# Patient Record
Sex: Female | Born: 1940
Health system: Southern US, Community
[De-identification: ages and names within clinical notes are randomized; demographics above are authoritative.]

## PROBLEM LIST (undated history)

## (undated) DIAGNOSIS — E782 Mixed hyperlipidemia: Secondary | ICD-10-CM

## (undated) DIAGNOSIS — R002 Palpitations: Secondary | ICD-10-CM

## (undated) DIAGNOSIS — M1990 Unspecified osteoarthritis, unspecified site: Secondary | ICD-10-CM

## (undated) DIAGNOSIS — I10 Essential (primary) hypertension: Secondary | ICD-10-CM

## (undated) DIAGNOSIS — C801 Malignant (primary) neoplasm, unspecified: Secondary | ICD-10-CM

## (undated) DIAGNOSIS — M48061 Spinal stenosis, lumbar region without neurogenic claudication: Secondary | ICD-10-CM

## (undated) DIAGNOSIS — E119 Type 2 diabetes mellitus without complications: Secondary | ICD-10-CM

## (undated) DIAGNOSIS — F329 Major depressive disorder, single episode, unspecified: Secondary | ICD-10-CM

## (undated) DIAGNOSIS — K219 Gastro-esophageal reflux disease without esophagitis: Secondary | ICD-10-CM

## (undated) DIAGNOSIS — E039 Hypothyroidism, unspecified: Secondary | ICD-10-CM

## (undated) DIAGNOSIS — F32A Depression, unspecified: Secondary | ICD-10-CM

## (undated) DIAGNOSIS — C55 Malignant neoplasm of uterus, part unspecified: Secondary | ICD-10-CM

## (undated) DIAGNOSIS — M4800 Spinal stenosis, site unspecified: Secondary | ICD-10-CM

## (undated) DIAGNOSIS — F419 Anxiety disorder, unspecified: Secondary | ICD-10-CM

## (undated) DIAGNOSIS — Z8679 Personal history of other diseases of the circulatory system: Secondary | ICD-10-CM

## (undated) DIAGNOSIS — F4001 Agoraphobia with panic disorder: Secondary | ICD-10-CM

## (undated) DIAGNOSIS — H409 Unspecified glaucoma: Secondary | ICD-10-CM

## (undated) DIAGNOSIS — C541 Malignant neoplasm of endometrium: Secondary | ICD-10-CM

## (undated) HISTORY — DX: Type 2 diabetes mellitus without complications: E11.9

## (undated) HISTORY — DX: Unspecified osteoarthritis, unspecified site: M19.90

## (undated) HISTORY — PX: REPLACEMENT TOTAL KNEE: SUR1224

## (undated) HISTORY — DX: Unspecified glaucoma: H40.9

## (undated) HISTORY — DX: Hypothyroidism, unspecified: E03.9

## (undated) HISTORY — PX: ABDOMINAL HYSTERECTOMY: SHX81

## (undated) HISTORY — DX: Spinal stenosis, lumbar region without neurogenic claudication: M48.061

## (undated) HISTORY — DX: Palpitations: R00.2

## (undated) HISTORY — DX: Mixed hyperlipidemia: E78.2

## (undated) HISTORY — PX: LIPOMA EXCISION: SHX5283

## (undated) HISTORY — PX: OTHER SURGICAL HISTORY: SHX169

## (undated) HISTORY — DX: Essential (primary) hypertension: I10

## (undated) HISTORY — DX: Malignant (primary) neoplasm, unspecified: C80.1

## (undated) HISTORY — PX: TONSILLECTOMY AND ADENOIDECTOMY: SUR1326

## (undated) HISTORY — DX: Malignant neoplasm of uterus, part unspecified: C55

## (undated) HISTORY — DX: Agoraphobia with panic disorder: F40.01

## (undated) HISTORY — PX: BREAST BIOPSY: SHX20

## (undated) HISTORY — DX: Malignant neoplasm of endometrium: C54.1

---

## 2012-03-22 ENCOUNTER — Ambulatory Visit (HOSPITAL_COMMUNITY)
Admission: RE | Admit: 2012-03-22 | Discharge: 2012-03-22 | Disposition: A | Discharge status: Home / Self Care | Payer: Medicare Other | Source: Ambulatory Visit | Attending: Ophthalmology | Admitting: Ophthalmology

## 2012-03-22 ENCOUNTER — Encounter (HOSPITAL_COMMUNITY)
Admission: RE | Disposition: A | Discharge status: Home / Self Care | Payer: Self-pay | Source: Ambulatory Visit | Attending: Ophthalmology

## 2012-03-22 ENCOUNTER — Encounter (HOSPITAL_COMMUNITY): Payer: Self-pay | Admitting: *Deleted

## 2012-03-22 DIAGNOSIS — E7800 Pure hypercholesterolemia, unspecified: Secondary | ICD-10-CM | POA: Insufficient documentation

## 2012-03-22 DIAGNOSIS — H4010X0 Unspecified open-angle glaucoma, stage unspecified: Secondary | ICD-10-CM | POA: Insufficient documentation

## 2012-03-22 SURGERY — SLT LASER APPLICATION
Anesthesia: LOCAL | Site: Eye | Laterality: Right

## 2012-03-22 MED ORDER — PILOCARPINE HCL 1 % OP SOLN
OPHTHALMIC | Status: AC
  Administered 2012-03-22: 1 [drp] via OPHTHALMIC
  Filled 2012-03-22: qty 15

## 2012-03-22 MED ORDER — TETRACAINE HCL 0.5 % OP SOLN
OPHTHALMIC | Status: AC
  Administered 2012-03-22: 1 [drp] via OPHTHALMIC
  Filled 2012-03-22: qty 2

## 2012-03-22 MED ORDER — APRACLONIDINE HCL 1 % OP SOLN
1.0000 [drp] | OPHTHALMIC | Status: AC
  Administered 2012-03-22 (×3): 1 [drp] via OPHTHALMIC

## 2012-03-22 MED ORDER — APRACLONIDINE HCL 1 % OP SOLN
OPHTHALMIC | Status: AC
  Administered 2012-03-22: 1 [drp] via OPHTHALMIC
  Filled 2012-03-22: qty 0.1

## 2012-03-22 MED ORDER — TETRACAINE HCL 0.5 % OP SOLN
1.0000 [drp] | Freq: Once | OPHTHALMIC | Status: AC
  Administered 2012-03-22: 1 [drp] via OPHTHALMIC

## 2012-03-22 MED ORDER — PILOCARPINE HCL 1 % OP SOLN
1.0000 [drp] | Freq: Once | OPHTHALMIC | Status: AC
  Administered 2012-03-22: 1 [drp] via OPHTHALMIC

## 2012-03-22 NOTE — Discharge Instructions (Signed)
Yesenia Reynolds  03/22/2012     Instructions    Activity: No Restrictions.   Diet: Resume Diet you were on at home.   Pain Medication: Tylenol if Needed.   CONTACT YOUR DOCTOR IF YOU HAVE PAIN, REDNESS IN YOUR EYE, OR DECREASED VISION.   Follow-up wiith Susa Simmonds, MD. May 9th at 11:15   Dr. Lahoma Crocker: [phone]  Dr. Lita Mains: [phone]  Dr. Alto Denver: [phone]   If you find that you cannot contact your physician, but feel that your signs and   Symptoms warrant a physician's attention, call the Emergency Room at   [phone] ext.[phone].

## 2012-03-22 NOTE — H&P (Signed)
I have reviewed the pre printed H&P, the patient was re-examined, and I have identified no significant interval changes in the patient's medical condition.  There is no change in the plan of care since the history and physical of record. 

## 2012-03-22 NOTE — Procedures (Signed)
Yesenia Reynolds 03/22/2012  Susa Simmonds, MD  Yag Laser Self Test Completedyes. Procedure: Iridotomy/SLT, right eye.  Eye Protection Worn by Staff yes. Laser In Use Sign on Door yes.  Laser: Nd:YAG Spot Size: Fixed Burst Mode: III Power Setting: 0.7 mJ/burst Position treated: 360 o'clock position Number of shots: 111 Total energy delivered: 89.5 mJ  Patency of the peripheral iridotomy was confirmed visually.  The patient tolerated the procedure without difficulty. No complications were encountered.  Tenometer reading immediately after procedure: 23 mmHg.  The patient was discharged home with the instructions to continue all her current glaucoma medications, if any.    Patient verbalizes understanding of discharge instructions yes.   Notes:gonioscopy OD:  open grade 4 360 deg.  mild pigment, no synechiae or recessions

## 2012-06-09 ENCOUNTER — Encounter: Payer: Self-pay | Admitting: Cardiology

## 2012-07-05 ENCOUNTER — Encounter: Payer: Self-pay | Admitting: Cardiology

## 2012-07-05 ENCOUNTER — Ambulatory Visit (INDEPENDENT_AMBULATORY_CARE_PROVIDER_SITE_OTHER): Payer: Medicare Other | Admitting: Cardiology

## 2012-07-05 VITALS — BP 147/83 | HR 91 | Ht 61.0 in | Wt 179.8 lb

## 2012-07-05 DIAGNOSIS — E782 Mixed hyperlipidemia: Secondary | ICD-10-CM

## 2012-07-05 DIAGNOSIS — I779 Disorder of arteries and arterioles, unspecified: Secondary | ICD-10-CM

## 2012-07-05 DIAGNOSIS — R002 Palpitations: Secondary | ICD-10-CM | POA: Insufficient documentation

## 2012-07-05 DIAGNOSIS — Z01810 Encounter for preprocedural cardiovascular examination: Secondary | ICD-10-CM

## 2012-07-05 DIAGNOSIS — I10 Essential (primary) hypertension: Secondary | ICD-10-CM

## 2012-07-05 MED ORDER — METOPROLOL SUCCINATE ER 25 MG PO TB24: 12.5000 mg | ORAL_TABLET | Freq: Every day | ORAL | Status: DC

## 2012-07-05 NOTE — Assessment & Plan Note (Signed)
Most likely benign with normal LV function and no syncope. Will observe for now on low-dose beta blocker. Could always consider further outpatient monitoring if symptoms progress.

## 2012-07-05 NOTE — Assessment & Plan Note (Signed)
On statin therapy. Would try to get LDL closer to 70 with documented carotid artery disease.

## 2012-07-05 NOTE — Assessment & Plan Note (Signed)
Nonobstructive and asymptomatic. Would suggest an aspirin daily and continuing statin therapy.

## 2012-07-05 NOTE — Progress Notes (Signed)
Clinical Summary Yesenia Reynolds is a 71 y.o.female referred for cardiology consultation by Dr. Sherril Croon for preoperative consultation. She will be undergoing elective left knee replacement with Dr. Silvano Rusk in late August.  From a cardiac perspective, she reports no exertional chest pain or unusual shortness of breath beyond NYHA class II. She reports a vague sense of intermittent brief palpitations, however no dizziness or syncope. No history of stroke or cardiac dysrhythmia.  Recent echocardiogram report from [facility] internal medicine reviewed showing normal LVEF 60-65% with diastolic dysfunction, thickened mitral leaflets with mild mitral regurgitation, mild to moderate aortic regurgitation, mild tricuspid regurgitation, minimal pericardial effusion versus epicardial fat tissue. Lab work from June showed TSH 1.2, hemoglobin 12.8, platelets 263, BUN 18, creatinine 0.6, potassium 4.1, AST 11, ALT 12, cholesterol 167, triglycerides 144, HDL 49, LDL 109.  She also had carotid Dopplers done that showed less than 50% bilateral ICA stenoses. We discussed this today, rationale for considering aspirin, also aggressive lipid management.  Her ECG today shows sinus rhythm with nonspecific T-wave changes.   No Known Allergies  Current Outpatient Prescriptions  Medication Sig Dispense Refill  . acetaminophen (TYLENOL) 500 MG tablet Take 500 mg by mouth 3 (three) times daily.      . Ascorbic Acid (VITAMIN C) 1000 MG tablet Take 1,000 mg by mouth daily.      Marland Kitchen atorvastatin (LIPITOR) 10 MG tablet Take 10 mg by mouth daily.      . cholecalciferol (VITAMIN D) 1000 UNITS tablet Take 1,000 Units by mouth daily.      Marland Kitchen ibuprofen (ADVIL,MOTRIN) 200 MG tablet Take 200 mg by mouth 3 (three) times daily.      Marland Kitchen latanoprost (XALATAN) 0.005 % ophthalmic solution Place 1 drop into both eyes at bedtime.      Marland Kitchen levothyroxine (SYNTHROID, LEVOTHROID) 88 MCG tablet Take 88 mcg by mouth daily.      Marland Kitchen lisinopril (PRINIVIL,ZESTRIL) 10  MG tablet Take 10 mg by mouth daily.      Marland Kitchen thyroid (ARMOUR) 120 MG tablet Take 88 mg by mouth daily.      . timolol (TIMOPTIC) 0.5 % ophthalmic solution Place 1 drop into the left eye 2 (two) times daily.      . vitamin E 400 UNIT capsule Take 400 Units by mouth daily.        Past Medical History  Diagnosis Date  . Fibromyalgia   . Mixed hyperlipidemia   . Hypothyroidism   . Essential hypertension, benign   . Type 2 diabetes mellitus   . Arthritis   . Carotid artery disease     Less than 50% ICA stenoses 6/13    Past Surgical History  Procedure Date  . Multiple dental extractions   . Breast biopsy   . Lipoma excision     Family History  Problem Relation Age of Onset  . Stroke Mother   . Heart failure Mother   . Hypertension Father   . Coronary artery disease Father     Social History Yesenia Reynolds reports that she has never smoked. She does not have any smokeless tobacco history on file. Yesenia Reynolds reports that she does not drink alcohol.  Review of Systems Chronic back and left knee pain with ambulation. No orthopnea or PND. Stable appetite. No reported bleeding problems. No fevers or chills. Otherwise negative except as outlined.  Physical Examination Filed Vitals:   07/05/12 1305  BP: 147/83  Pulse: 91   Overweight woman in no acute distress. HEENT:  Conjunctiva and lids normal, oropharynx clear. Neck: Supple, no elevated JVP or carotid bruits, no thyromegaly. Lungs: Clear to auscultation, nonlabored breathing at rest. Cardiac: Regular rate and rhythm, no S3 with soft systolic murmur, no pericardial rub. Abdomen: Soft, nontender,  bowel sounds present, no guarding or rebound. Extremities: No pitting edema, distal pulses 2+. Skin: Warm and dry. Musculoskeletal: No kyphosis. Neuropsychiatric: Alert and oriented x3, affect grossly appropriate.    Problem List and Plan   Preoperative cardiovascular examination Recent testing reviewed as well as followup ECG  today. She reports no exertional symptoms of chest pain or limiting breathlessness, only intermittent palpitations without any associated syncope. LV function is normal, and she is on medical therapy for hypertension and hyperlipidemia. She is being considered for elective left knee surgery, presumably under general anesthesia. I would expect that she should be able to proceed with an acceptable perioperative cardiac risk. We will add a low-dose beta blocker for risk reduction, perhaps only use this temporarily. Follow up will be arranged.  Palpitations Most likely benign with normal LV function and no syncope. Will observe for now on low-dose beta blocker. Could always consider further outpatient monitoring if symptoms progress.  Mixed hyperlipidemia On statin therapy. Would try to get LDL closer to 70 with documented carotid artery disease.  Essential hypertension, benign No changes made to current regimen, other than perioperative beta blocker.  Carotid artery disease Nonobstructive and asymptomatic. Would suggest an aspirin daily and continuing statin therapy.    Jonelle Sidle, M.D., F.A.C.C.

## 2012-07-05 NOTE — Assessment & Plan Note (Signed)
No changes made to current regimen, other than perioperative beta blocker.

## 2012-07-05 NOTE — Assessment & Plan Note (Signed)
Recent testing reviewed as well as followup ECG today. She reports no exertional symptoms of chest pain or limiting breathlessness, only intermittent palpitations without any associated syncope. LV function is normal, and she is on medical therapy for hypertension and hyperlipidemia. She is being considered for elective left knee surgery, presumably under general anesthesia. I would expect that she should be able to proceed with an acceptable perioperative cardiac risk. We will add a low-dose beta blocker for risk reduction, perhaps only use this temporarily. Follow up will be arranged.

## 2012-07-05 NOTE — Patient Instructions (Addendum)
Your physician recommends that you schedule a follow-up appointment in: 2 months with Dr. Diona Browner. Your physician has recommended you make the following change in your medication: start Toprol XL 12. 5 mg daily. Your new prescription has been sent to your pharmacy. All other medications are still the same.

## 2012-07-08 ENCOUNTER — Other Ambulatory Visit: Payer: Self-pay | Admitting: Orthopedic Surgery

## 2012-07-08 MED ORDER — BUPIVACAINE 0.25 % ON-Q PUMP SINGLE CATH 300ML: 300.0000 mL | INJECTION | Status: DC

## 2012-07-08 MED ORDER — DEXAMETHASONE SODIUM PHOSPHATE 10 MG/ML IJ SOLN: 10.0000 mg | Freq: Once | INTRAMUSCULAR | Status: DC

## 2012-07-08 NOTE — Progress Notes (Signed)
Preoperative surgical orders have been place into the [facility] hospital system for Yesenia Reynolds on 07/08/2012, 10:22 PM  by Patrica Duel for surgery on 08/04/12.  Preop Total Knee orders including Bupivacaine On-Q pump, IV Tylenol, and IV Decadron as long as there are no contraindications to the above medications. [name], PA-C

## 2012-07-23 ENCOUNTER — Encounter (HOSPITAL_COMMUNITY): Payer: Self-pay

## 2012-07-26 ENCOUNTER — Encounter (HOSPITAL_COMMUNITY)
Admission: RE | Disposition: A | Discharge status: Home / Self Care | Payer: Self-pay | Source: Ambulatory Visit | Attending: Ophthalmology

## 2012-07-26 ENCOUNTER — Ambulatory Visit (HOSPITAL_COMMUNITY)
Admission: RE | Admit: 2012-07-26 | Discharge: 2012-07-26 | Disposition: A | Discharge status: Home / Self Care | Payer: Medicare Other | Source: Ambulatory Visit | Attending: Ophthalmology | Admitting: Ophthalmology

## 2012-07-26 ENCOUNTER — Encounter (HOSPITAL_COMMUNITY): Payer: Self-pay | Admitting: *Deleted

## 2012-07-26 DIAGNOSIS — H409 Unspecified glaucoma: Secondary | ICD-10-CM | POA: Insufficient documentation

## 2012-07-26 SURGERY — SLT LASER APPLICATION
Anesthesia: LOCAL | Site: Eye | Laterality: Left

## 2012-07-26 MED ORDER — TETRACAINE HCL 0.5 % OP SOLN
1.0000 [drp] | Freq: Two times a day (BID) | OPHTHALMIC | Status: DC
  Administered 2012-07-26: 1 [drp] via OPHTHALMIC

## 2012-07-26 MED ORDER — APRACLONIDINE HCL 1 % OP SOLN
1.0000 [drp] | OPHTHALMIC | Status: AC
  Administered 2012-07-26 (×3): 1 [drp] via OPHTHALMIC

## 2012-07-26 MED ORDER — APRACLONIDINE HCL 1 % OP SOLN: 1.0000 [drp] | Freq: Once | OPHTHALMIC | Status: DC

## 2012-07-26 MED ORDER — APRACLONIDINE HCL 1 % OP SOLN
OPHTHALMIC | Status: AC
  Filled 2012-07-26: qty 0.1

## 2012-07-26 MED ORDER — PILOCARPINE HCL 1 % OP SOLN
OPHTHALMIC | Status: AC
  Filled 2012-07-26: qty 15

## 2012-07-26 MED ORDER — TETRACAINE HCL 0.5 % OP SOLN
OPHTHALMIC | Status: AC
  Filled 2012-07-26: qty 2

## 2012-07-26 MED ORDER — PILOCARPINE HCL 1 % OP SOLN
1.0000 [drp] | Freq: Once | OPHTHALMIC | Status: AC
  Administered 2012-07-26: 1 [drp] via OPHTHALMIC

## 2012-07-26 NOTE — H&P (Signed)
I have reviewed the pre printed H&P, the patient was re-examined, and I have identified no significant interval changes in the patient's medical condition.  There is no change in the plan of care since the history and physical of record. 

## 2012-07-26 NOTE — Brief Op Note (Signed)
Yesenia Reynolds 07/26/2012  Susa Simmonds, MD  Yag Laser Self Test Completedyes. Procedure: Iridotomy/SLT, left eye.  Eye Protection Worn by Staff yes. Laser In Use Sign on Door yes.  Laser: Nd:YAG/SLT Spot Size: Fixed Power Setting: 0.9 mJ/burst Position treated: 360 degrees trabecular meshwork Number of shots: 103 Total energy delivered: 92.7 mJ  The patient tolerated the procedure without difficulty. No complications were encountered.  Tenometer reading immediately after procedure: 20  mmHg.  The patient was discharged home with the instructions to continue all her current glaucoma medications, if any.   Patient verbalizes understanding of discharge instructions yes.   Notes:gonioscopy OS:  open 360 deg.  mild pigmentation, sup quadrant plateau iris, several small synechiae noted

## 2012-07-29 ENCOUNTER — Encounter (HOSPITAL_COMMUNITY)
Admission: RE | Admit: 2012-07-29 | Discharge: 2012-07-29 | Disposition: A | Discharge status: Home / Self Care | Payer: Medicare Other | Source: Ambulatory Visit | Attending: Orthopedic Surgery | Admitting: Orthopedic Surgery

## 2012-07-29 ENCOUNTER — Ambulatory Visit (HOSPITAL_COMMUNITY)
Admission: RE | Admit: 2012-07-29 | Discharge: 2012-07-29 | Disposition: A | Discharge status: Home / Self Care | Payer: Medicare Other | Source: Ambulatory Visit | Attending: Orthopedic Surgery | Admitting: Orthopedic Surgery

## 2012-07-29 ENCOUNTER — Encounter (HOSPITAL_COMMUNITY): Payer: Self-pay

## 2012-07-29 DIAGNOSIS — E119 Type 2 diabetes mellitus without complications: Secondary | ICD-10-CM | POA: Insufficient documentation

## 2012-07-29 DIAGNOSIS — Z01818 Encounter for other preprocedural examination: Secondary | ICD-10-CM | POA: Insufficient documentation

## 2012-07-29 DIAGNOSIS — Z01812 Encounter for preprocedural laboratory examination: Secondary | ICD-10-CM | POA: Insufficient documentation

## 2012-07-29 DIAGNOSIS — I10 Essential (primary) hypertension: Secondary | ICD-10-CM | POA: Insufficient documentation

## 2012-07-29 HISTORY — DX: Gastro-esophageal reflux disease without esophagitis: K21.9

## 2012-07-29 HISTORY — DX: Spinal stenosis, site unspecified: M48.00

## 2012-07-29 LAB — COMPREHENSIVE METABOLIC PANEL
Alkaline Phosphatase: 94 U/L (ref 39–117)
BUN: 15 mg/dL (ref 6–23)
Chloride: 100 mEq/L (ref 96–112)
Creatinine, Ser: 0.61 mg/dL (ref 0.50–1.10)
GFR calc Af Amer: >90 mL/min (ref >90)
Glucose, Bld: 101 mg/dL — ABNORMAL HIGH (ref 70–99)
Potassium: 4 mEq/L (ref 3.5–5.1)
Total Bilirubin: 0.4 mg/dL (ref 0.3–1.2)

## 2012-07-29 LAB — URINALYSIS, ROUTINE W REFLEX MICROSCOPIC
Bilirubin Urine: NEGATIVE
Ketones, ur: NEGATIVE mg/dL
Protein, ur: NEGATIVE mg/dL
Urobilinogen, UA: 1 mg/dL (ref 0.0–1.0)

## 2012-07-29 LAB — URINE MICROSCOPIC-ADD ON

## 2012-07-29 LAB — CBC
HCT: 40.1 % (ref 36.0–46.0)
Hemoglobin: 13.4 g/dL (ref 12.0–15.0)
MCHC: 33.4 g/dL (ref 30.0–36.0)
MCV: 86.4 fL (ref 78.0–100.0)
WBC: 6.4 10*3/uL (ref 4.0–10.5)

## 2012-07-29 LAB — PROTIME-INR
INR: 0.96 (ref 0.00–1.49)
Prothrombin Time: 13 seconds (ref 11.6–15.2)

## 2012-07-29 LAB — APTT: aPTT: 32 seconds (ref 24–37)

## 2012-07-29 NOTE — Patient Instructions (Signed)
20 Yesenia Reynolds  07/29/2012   Your procedure is scheduled on:  08/04/12 100pm-230pm  Report to [facility] at 1030 AM.  Call this number if you have problems the morning of surgery: [phone]   Remember:   Do not eat food:After Midnight.  May have clear liquids:until Midnight .    Take these medicines the morning of surgery with A SIP OF WATER:   Do not wear jewelry, make-up or nail polish.  Do not wear lotions, powders, or perfumes.   Do not shave 48 hours prior to surgery.  Do not bring valuables to the hospital.  Contacts, dentures or bridgework may not be worn into surgery.  Leave suitcase in the car. After surgery it may be brought to your room.  For patients admitted to the hospital, checkout time is 11:00 AM the day of discharge.       Special Instructions: CHG Shower Use Special Wash: 1/2 bottle night before surgery and 1/2 bottle morning of surgery. shower chin to toes with CHG.  Wash face and private parts with regular soap.    Please read over the following fact sheets that you were given: MRSA Information, Blood Transfusion Fact sheet, Incentive Spirometry FAct sheet, coughing and deep breathing exercises, leg exercises

## 2012-07-29 NOTE — Progress Notes (Signed)
Abnormal urinalysis and micro faxed to Dr Lequita Halt to note and confirmation received

## 2012-08-03 ENCOUNTER — Other Ambulatory Visit: Payer: Self-pay | Admitting: Orthopedic Surgery

## 2012-08-03 NOTE — H&P (Signed)
Yesenia Reynolds  DOB: 12/29/1940 Single / Language: English / Race: White Female  Date of Admission:  08/04/2012  Chief Complaint:  Left Knee Pain  History of Present Illness The patient is a 71 year old female who comes in for a preoperative History and Physical. The patient is scheduled for a left total knee arthroplasty to be performed by Dr. Frank V. Aluisio, MD at  Hospital on 08/04/2012. The patient is a 71 year old female who presents with knee complaints. The patient reports left knee symptoms including: pain which began year(s) ago without any known injury.The patient feels that the symptoms are unchanging. Past treatment for this problem has included intra-articular injection of corticosteroids (years ago). Symptoms are reported to be located in the left knee and include knee pain (popping and clicking), swelling and instability. The patient does not report any radiation of symptoms. The patient describes their pain as dull and aching. Onset of symptoms was with symptoms now occurring frequently. The episodes occur daily. Symptoms are exacerbated by weight bearing and walking. The knee is bothering her with all activities. It generally does better with rest. It is definitely limiting what she can and cannot do. She would like to be a more avid walker but cannot do so because of the knee. Both knees hurt, but the left is more symptomatic than the right. She's at a stage now where she feels she needs to get something done with this knee. It's affecting everything she does. Pain is definitely worsening.  She is ready for surgery. They have been treated conservatively in the past for the above stated problem and despite conservative measures, they continue to have progressive pain and severe functional limitations and dysfunction. They have failed non-operative management. It is felt that they would benefit from undergoing total joint replacement. Risks and benefits  of the procedure have been discussed with the patient and they elect to proceed with surgery. There are no active contraindications to surgery such as ongoing infection or rapidly progressive neurological disease.     Allergies No Known Drug Allergies   Family History Cerebrovascular Accident. mother, grandmother mothers side and grandfather fathers side mother and grandmother mothers side Congestive Heart Failure. mother Heart Disease. father father, grandfather mothers side and grandfather fathers side Hypertension. mother Osteoarthritis. mother and father   Social History Alcohol use. never consumed alcohol Children. 0 Current work status. retired Drug/Alcohol Rehab (Currently). no Exercise. Exercises daily Illicit drug use. no Marital status. single Number of flights of stairs before winded. 1 Pain Contract. no Previously in rehab. no Tobacco / smoke exposure. no Tobacco use. never smoker   Medication History Atorvastatin Calcium (10MG Tablet, Oral) Active. (start in a week) Lisinopril (10MG Tablet, Oral) Active. Advil (200MG Capsule, 1 (one) Oral) Active. Tylenol (325MG Tablet, 1 (one) Oral) Active. Vitamin C (1 (one) Oral) Specific dose unknown - Active. Vitamin D (1 (one) Oral) Specific dose unknown - Active. Vitamin E (1 (one) Oral) Specific dose unknown - Active. Levothyroxine Sodium (88MCG Tablet, Oral) Active. Latanoprost (0.005% Solution, Ophthalmic) Active. Timoptic Ocudose (0.5% Solution, Ophthalmic) Active. Aspirin (81MG Tablet, 1 (one) Oral) Active. Metoprolol Succinate ER (25MG Tablet ER 24HR, Oral) Active.   Pregnancy / Birth History Pregnant. no   Past Surgical History Tonsillectomy Breast Mass; Local Excision. bilateral Breast Biopsy. bilateral  Past Medical History Anxiety Disorder Diabetes Mellitus, Type II High blood pressure Hypercholesterolemia Hypothyroidism Osteoarthritis   Review of  Systems General:Not Present- Chills, Fever, Night Sweats, Fatigue, Weight Gain,   Weight Loss and Memory Loss. Skin:Not Present- Hives, Itching, Rash, Eczema and Lesions. HEENT:Not Present- Tinnitus, Headache, Double Vision, Visual Loss, Hearing Loss and Dentures. Respiratory:Not Present- Shortness of breath with exertion, Shortness of breath at rest, Allergies, Coughing up blood and Chronic Cough. Cardiovascular:Not Present- Chest Pain, Racing/skipping heartbeats, Difficulty Breathing Lying Down, Murmur, Swelling and Palpitations. Gastrointestinal:Not Present- Bloody Stool, Heartburn, Abdominal Pain, Vomiting, Nausea, Constipation, Diarrhea, Difficulty Swallowing, Jaundice and Loss of appetitie. Female Genitourinary:Not Present- Blood in Urine, Urinary frequency, Weak urinary stream, Discharge, Flank Pain, Incontinence, Painful Urination, Urgency, Urinary Retention and Urinating at Night. Musculoskeletal:Present- Joint Swelling, Joint Pain, Back Pain and Morning Stiffness. Not Present- Muscle Weakness, Muscle Pain and Spasms. Neurological:Not Present- Tremor, Dizziness, Blackout spells, Paralysis, Difficulty with balance and Weakness. Psychiatric:Not Present- Insomnia.   Vitals Weight: 180 lb Height: 61 in Body Surface Area: 1.87 m Body Mass Index: 34.01 kg/m Pulse: 76 (Regular) Resp.: 12 (Unlabored) BP: 162/82 (Sitting, Right Arm, Standard)    Physical Exam The physical exam findings are as follows:  Patient is accompanied today by a friend.   General Mental Status - Alert, cooperative and good historian. General Appearance- pleasant. Not in acute distress. Orientation- Oriented X3. Build & Nutrition- Well nourished and Well developed.   Head and Neck Head- normocephalic, atraumatic . Neck Global Assessment- supple. no bruit auscultated on the right and no bruit auscultated on the left.   Eye Pupil- Bilateral- Regular and Round. Motion-  Bilateral- EOMI.   Chest and Lung Exam Auscultation: Breath sounds:- clear at anterior chest wall and - clear at posterior chest wall. Adventitious sounds:- No Adventitious sounds.   Cardiovascular Auscultation:Rhythm- Regular rate and rhythm. Heart Sounds- S1 WNL and S2 WNL. Murmurs & Other Heart Sounds:Auscultation of the heart reveals - No Murmurs.   Abdomen Palpation/Percussion:Tenderness- Abdomen is non-tender to palpation. Rigidity (guarding)- Abdomen is soft. Auscultation:Auscultation of the abdomen reveals - Bowel sounds normal.   Female Genitourinary  Not done, not pertinent to present illness  Musculoskeletal On examination, she's alert and oriented, in no apparent distress. Her left hip and right hip both show normal ROM with no discomfort. Right knee, no effusion. Range 5-125. Moderate crepitus on ROM. Slight tenderness, medial greater than lateral. No instability. Left knee: Significant varus, 10 degree flexion contracture, flexion to 110, marked crepitus on ROM, tenderness medial greater than lateral with no instability. She's got a varus deformity also.  RADIOGRAPHS: AP of both knees and lateral show severe end stage arthritis of the left knee bone on bone medial and patellofemoral with tibial subluxation. Right knee shows very mild change.  Assessment & Plan Osteoarthritis, Knee (715.96) Impression: Left Knee  Note: Patient is for a Left Total Knee Replacement by Dr. Aluisio.  Plan is to go home.  PCP - Dr. Dhruv Vyas  Signed electronically by DREW L Viraj Liby, PA-C  

## 2012-08-04 ENCOUNTER — Inpatient Hospital Stay (HOSPITAL_COMMUNITY): Payer: Medicare Other | Admitting: Anesthesiology

## 2012-08-04 ENCOUNTER — Inpatient Hospital Stay (HOSPITAL_COMMUNITY)
Admission: RE | Admit: 2012-08-04 | Discharge: 2012-08-07 | DRG: 470 | Disposition: A | Discharge status: Home Health | Payer: Medicare Other | Source: Ambulatory Visit | Attending: Orthopedic Surgery | Admitting: Orthopedic Surgery

## 2012-08-04 ENCOUNTER — Encounter (HOSPITAL_COMMUNITY): Payer: Self-pay | Admitting: *Deleted

## 2012-08-04 ENCOUNTER — Encounter (HOSPITAL_COMMUNITY): Payer: Self-pay | Admitting: Anesthesiology

## 2012-08-04 ENCOUNTER — Encounter (HOSPITAL_COMMUNITY)
Admission: RE | Disposition: A | Discharge status: Home Health | Payer: Self-pay | Source: Ambulatory Visit | Attending: Orthopedic Surgery

## 2012-08-04 DIAGNOSIS — M1710 Unilateral primary osteoarthritis, unspecified knee: Secondary | ICD-10-CM | POA: Diagnosis present

## 2012-08-04 DIAGNOSIS — E782 Mixed hyperlipidemia: Secondary | ICD-10-CM | POA: Diagnosis present

## 2012-08-04 DIAGNOSIS — E119 Type 2 diabetes mellitus without complications: Secondary | ICD-10-CM | POA: Diagnosis present

## 2012-08-04 DIAGNOSIS — K219 Gastro-esophageal reflux disease without esophagitis: Secondary | ICD-10-CM | POA: Diagnosis present

## 2012-08-04 DIAGNOSIS — D62 Acute posthemorrhagic anemia: Secondary | ICD-10-CM | POA: Diagnosis not present

## 2012-08-04 DIAGNOSIS — I10 Essential (primary) hypertension: Secondary | ICD-10-CM | POA: Diagnosis present

## 2012-08-04 DIAGNOSIS — E039 Hypothyroidism, unspecified: Secondary | ICD-10-CM | POA: Diagnosis present

## 2012-08-04 DIAGNOSIS — M179 Osteoarthritis of knee, unspecified: Secondary | ICD-10-CM | POA: Diagnosis present

## 2012-08-04 DIAGNOSIS — Z96659 Presence of unspecified artificial knee joint: Secondary | ICD-10-CM

## 2012-08-04 HISTORY — PX: TOTAL KNEE ARTHROPLASTY: SHX125

## 2012-08-04 LAB — TYPE AND SCREEN
ABO/RH(D): O NEG
Antibody Screen: NEGATIVE

## 2012-08-04 SURGERY — ARTHROPLASTY, KNEE, TOTAL
Anesthesia: General | Site: Knee | Laterality: Left | Wound class: Clean

## 2012-08-04 MED ORDER — FENTANYL CITRATE 0.05 MG/ML IJ SOLN
INTRAMUSCULAR | Status: DC | PRN
  Administered 2012-08-04 (×2): 50 ug via INTRAVENOUS

## 2012-08-04 MED ORDER — CEFAZOLIN SODIUM-DEXTROSE 2-3 GM-% IV SOLR
2.0000 g | INTRAVENOUS | Status: AC
  Administered 2012-08-04: 2 g via INTRAVENOUS

## 2012-08-04 MED ORDER — SODIUM CHLORIDE 0.9 % IJ SOLN: 9.0000 mL | INTRAMUSCULAR | Status: DC | PRN

## 2012-08-04 MED ORDER — LEVOTHYROXINE SODIUM 88 MCG PO TABS
88.0000 ug | ORAL_TABLET | Freq: Every day | ORAL | Status: DC
  Administered 2012-08-05 – 2012-08-07 (×3): 88 ug via ORAL
  Filled 2012-08-04 (×5): qty 1

## 2012-08-04 MED ORDER — METOCLOPRAMIDE HCL 5 MG/ML IJ SOLN: 5.0000 mg | Freq: Three times a day (TID) | INTRAMUSCULAR | Status: DC | PRN

## 2012-08-04 MED ORDER — BUPIVACAINE 0.25 % ON-Q PUMP SINGLE CATH 300ML
INJECTION | Status: AC
  Filled 2012-08-04: qty 300

## 2012-08-04 MED ORDER — PHENOL 1.4 % MT LIQD
1.0000 | OROMUCOSAL | Status: DC | PRN
  Filled 2012-08-04: qty 177

## 2012-08-04 MED ORDER — TRAMADOL HCL 50 MG PO TABS: 50.0000 mg | ORAL_TABLET | Freq: Four times a day (QID) | ORAL | Status: DC | PRN

## 2012-08-04 MED ORDER — METHOCARBAMOL 500 MG PO TABS
500.0000 mg | ORAL_TABLET | Freq: Four times a day (QID) | ORAL | Status: DC | PRN
  Administered 2012-08-05 – 2012-08-07 (×2): 500 mg via ORAL
  Filled 2012-08-04 (×3): qty 1

## 2012-08-04 MED ORDER — ONDANSETRON HCL 4 MG/2ML IJ SOLN
INTRAMUSCULAR | Status: DC | PRN
  Administered 2012-08-04: 4 mg via INTRAVENOUS

## 2012-08-04 MED ORDER — OXYCODONE HCL 5 MG PO TABS
5.0000 mg | ORAL_TABLET | ORAL | Status: DC | PRN
  Administered 2012-08-04: 10 mg via ORAL
  Administered 2012-08-04 – 2012-08-05 (×3): 5 mg via ORAL
  Administered 2012-08-05 (×3): 10 mg via ORAL
  Administered 2012-08-06 – 2012-08-07 (×6): 5 mg via ORAL
  Filled 2012-08-04: qty 2
  Filled 2012-08-04 (×2): qty 1
  Filled 2012-08-04: qty 2
  Filled 2012-08-04: qty 1
  Filled 2012-08-04 (×2): qty 2
  Filled 2012-08-04 (×2): qty 1
  Filled 2012-08-04: qty 2
  Filled 2012-08-04 (×3): qty 1
  Filled 2012-08-04: qty 2

## 2012-08-04 MED ORDER — MORPHINE SULFATE (PF) 1 MG/ML IV SOLN
INTRAVENOUS | Status: AC
  Filled 2012-08-04: qty 25

## 2012-08-04 MED ORDER — DIPHENHYDRAMINE HCL 12.5 MG/5ML PO ELIX: 12.5000 mg | ORAL_SOLUTION | Freq: Four times a day (QID) | ORAL | Status: DC | PRN

## 2012-08-04 MED ORDER — ONDANSETRON HCL 4 MG PO TABS: 4.0000 mg | ORAL_TABLET | Freq: Four times a day (QID) | ORAL | Status: DC | PRN

## 2012-08-04 MED ORDER — DOCUSATE SODIUM 100 MG PO CAPS
100.0000 mg | ORAL_CAPSULE | Freq: Two times a day (BID) | ORAL | Status: DC
  Administered 2012-08-04 – 2012-08-07 (×6): 100 mg via ORAL

## 2012-08-04 MED ORDER — FLEET ENEMA 7-19 GM/118ML RE ENEM: 1.0000 | ENEMA | Freq: Once | RECTAL | Status: AC | PRN

## 2012-08-04 MED ORDER — CEFAZOLIN SODIUM 1-5 GM-% IV SOLN
1.0000 g | Freq: Four times a day (QID) | INTRAVENOUS | Status: AC
  Administered 2012-08-04 – 2012-08-05 (×2): 1 g via INTRAVENOUS
  Filled 2012-08-04 (×2): qty 50

## 2012-08-04 MED ORDER — POLYETHYLENE GLYCOL 3350 17 G PO PACK: 17.0000 g | PACK | Freq: Every day | ORAL | Status: DC | PRN

## 2012-08-04 MED ORDER — BISACODYL 10 MG RE SUPP: 10.0000 mg | Freq: Every day | RECTAL | Status: DC | PRN

## 2012-08-04 MED ORDER — RIVAROXABAN 10 MG PO TABS
10.0000 mg | ORAL_TABLET | Freq: Every day | ORAL | Status: DC
  Administered 2012-08-05 – 2012-08-07 (×3): 10 mg via ORAL
  Filled 2012-08-04 (×5): qty 1

## 2012-08-04 MED ORDER — LACTATED RINGERS IV SOLN
INTRAVENOUS | Status: DC
  Administered 2012-08-04: 1000 mL via INTRAVENOUS
  Administered 2012-08-04: 15:00:00 via INTRAVENOUS

## 2012-08-04 MED ORDER — METOCLOPRAMIDE HCL 10 MG PO TABS: 5.0000 mg | ORAL_TABLET | Freq: Three times a day (TID) | ORAL | Status: DC | PRN

## 2012-08-04 MED ORDER — DIPHENHYDRAMINE HCL 50 MG/ML IJ SOLN: 12.5000 mg | Freq: Four times a day (QID) | INTRAMUSCULAR | Status: DC | PRN

## 2012-08-04 MED ORDER — NALOXONE HCL 0.4 MG/ML IJ SOLN: 0.4000 mg | INTRAMUSCULAR | Status: DC | PRN

## 2012-08-04 MED ORDER — 0.9 % SODIUM CHLORIDE (POUR BTL) OPTIME
TOPICAL | Status: DC | PRN
  Administered 2012-08-04: 1000 mL

## 2012-08-04 MED ORDER — METOPROLOL SUCCINATE 12.5 MG HALF TABLET
12.5000 mg | ORAL_TABLET | Freq: Every morning | ORAL | Status: DC
  Administered 2012-08-05 – 2012-08-07 (×3): 12.5 mg via ORAL
  Filled 2012-08-04 (×3): qty 1

## 2012-08-04 MED ORDER — SODIUM CHLORIDE 0.9 % IV SOLN
INTRAVENOUS | Status: DC
  Administered 2012-08-04: 19:00:00 via INTRAVENOUS
  Administered 2012-08-05: 20 mL/h via INTRAVENOUS

## 2012-08-04 MED ORDER — PROPOFOL 10 MG/ML IV EMUL
INTRAVENOUS | Status: DC | PRN
  Administered 2012-08-04: 100 ug/kg/min via INTRAVENOUS

## 2012-08-04 MED ORDER — PROPOFOL INFUSION 10 MG/ML OPTIME
INTRAVENOUS | Status: DC | PRN
  Administered 2012-08-04: 75 ug/kg/min via INTRAVENOUS

## 2012-08-04 MED ORDER — ONDANSETRON HCL 4 MG/2ML IJ SOLN: 4.0000 mg | Freq: Four times a day (QID) | INTRAMUSCULAR | Status: DC | PRN

## 2012-08-04 MED ORDER — ACETAMINOPHEN 650 MG RE SUPP: 650.0000 mg | Freq: Four times a day (QID) | RECTAL | Status: DC | PRN

## 2012-08-04 MED ORDER — FAMOTIDINE 20 MG PO TABS
20.0000 mg | ORAL_TABLET | Freq: Two times a day (BID) | ORAL | Status: DC | PRN
  Filled 2012-08-04: qty 1

## 2012-08-04 MED ORDER — BUPIVACAINE 0.25 % ON-Q PUMP SINGLE CATH 300ML
INJECTION | Status: DC | PRN
  Administered 2012-08-04: 300 mL

## 2012-08-04 MED ORDER — MENTHOL 3 MG MT LOZG
1.0000 | LOZENGE | OROMUCOSAL | Status: DC | PRN
  Filled 2012-08-04: qty 9

## 2012-08-04 MED ORDER — SODIUM CHLORIDE 0.9 % IV BOLUS (SEPSIS)
250.0000 mL | Freq: Once | INTRAVENOUS | Status: AC
  Administered 2012-08-04: 250 mL via INTRAVENOUS

## 2012-08-04 MED ORDER — SODIUM CHLORIDE 0.9 % IV SOLN: INTRAVENOUS | Status: DC

## 2012-08-04 MED ORDER — BUPIVACAINE ON-Q PAIN PUMP (FOR ORDER SET NO CHG)
INJECTION | Status: DC
  Filled 2012-08-04: qty 1

## 2012-08-04 MED ORDER — DIPHENHYDRAMINE HCL 12.5 MG/5ML PO ELIX: 12.5000 mg | ORAL_SOLUTION | ORAL | Status: DC | PRN

## 2012-08-04 MED ORDER — CHLORHEXIDINE GLUCONATE 4 % EX LIQD
60.0000 mL | Freq: Once | CUTANEOUS | Status: DC
  Filled 2012-08-04: qty 60

## 2012-08-04 MED ORDER — LIDOCAINE HCL (CARDIAC) 20 MG/ML IV SOLN
INTRAVENOUS | Status: DC | PRN
  Administered 2012-08-04: 100 mg via INTRAVENOUS

## 2012-08-04 MED ORDER — ACETAMINOPHEN 325 MG PO TABS
650.0000 mg | ORAL_TABLET | Freq: Four times a day (QID) | ORAL | Status: DC | PRN
  Administered 2012-08-05 – 2012-08-06 (×2): 650 mg via ORAL
  Filled 2012-08-04 (×2): qty 2

## 2012-08-04 MED ORDER — ATORVASTATIN CALCIUM 10 MG PO TABS
10.0000 mg | ORAL_TABLET | Freq: Every evening | ORAL | Status: DC
  Administered 2012-08-04 – 2012-08-06 (×3): 10 mg via ORAL
  Filled 2012-08-04 (×4): qty 1

## 2012-08-04 MED ORDER — ACETAMINOPHEN 10 MG/ML IV SOLN
1000.0000 mg | Freq: Four times a day (QID) | INTRAVENOUS | Status: AC
  Administered 2012-08-04 – 2012-08-05 (×4): 1000 mg via INTRAVENOUS
  Filled 2012-08-04 (×6): qty 100

## 2012-08-04 MED ORDER — HYDROMORPHONE HCL PF 1 MG/ML IJ SOLN: 1.0000 mg | INTRAMUSCULAR | Status: DC | PRN

## 2012-08-04 MED ORDER — BUPIVACAINE IN DEXTROSE 0.75-8.25 % IT SOLN
INTRATHECAL | Status: DC | PRN
  Administered 2012-08-04: 2 mL via INTRATHECAL

## 2012-08-04 MED ORDER — PROMETHAZINE HCL 25 MG/ML IJ SOLN: 6.2500 mg | INTRAMUSCULAR | Status: DC | PRN

## 2012-08-04 MED ORDER — MIDAZOLAM HCL 5 MG/5ML IJ SOLN
INTRAMUSCULAR | Status: DC | PRN
  Administered 2012-08-04: 2 mg via INTRAVENOUS

## 2012-08-04 MED ORDER — ACETAMINOPHEN 10 MG/ML IV SOLN
1000.0000 mg | Freq: Once | INTRAVENOUS | Status: AC
  Administered 2012-08-04: 1000 mg via INTRAVENOUS

## 2012-08-04 MED ORDER — METHOCARBAMOL 100 MG/ML IJ SOLN
500.0000 mg | Freq: Four times a day (QID) | INTRAVENOUS | Status: DC | PRN
  Administered 2012-08-04 (×2): 500 mg via INTRAVENOUS
  Filled 2012-08-04 (×2): qty 5

## 2012-08-04 MED ORDER — MORPHINE SULFATE (PF) 1 MG/ML IV SOLN
INTRAVENOUS | Status: DC
  Administered 2012-08-04: 1 mg via INTRAVENOUS

## 2012-08-04 MED ORDER — HYDROMORPHONE HCL PF 1 MG/ML IJ SOLN: 0.2500 mg | INTRAMUSCULAR | Status: DC | PRN

## 2012-08-04 SURGICAL SUPPLY — 52 items
BAG ZIPLOCK 12X15 (MISCELLANEOUS) ×2 IMPLANT
BANDAGE ELASTIC 6 VELCRO ST LF (GAUZE/BANDAGES/DRESSINGS) ×2 IMPLANT
BANDAGE ESMARK 6X9 LF (GAUZE/BANDAGES/DRESSINGS) ×1 IMPLANT
BLADE SAG 18X100X1.27 (BLADE) ×2 IMPLANT
BLADE SAW SGTL 11.0X1.19X90.0M (BLADE) ×2 IMPLANT
BNDG ESMARK 6X9 LF (GAUZE/BANDAGES/DRESSINGS) ×2
BOWL SMART MIX CTS (DISPOSABLE) ×2 IMPLANT
CATH KIT ON-Q SILVERSOAK 5IN (CATHETERS) ×2 IMPLANT
CEMENT HV SMART SET (Cement) ×4 IMPLANT
CLOTH BEACON ORANGE TIMEOUT ST (SAFETY) ×2 IMPLANT
CLSR STERI-STRIP ANTIMIC 1/2X4 (GAUZE/BANDAGES/DRESSINGS) ×2 IMPLANT
CUFF TOURN SGL QUICK 34 (TOURNIQUET CUFF) ×1
CUFF TRNQT CYL 34X4X40X1 (TOURNIQUET CUFF) ×1 IMPLANT
DRAPE EXTREMITY T 121X128X90 (DRAPE) ×2 IMPLANT
DRAPE POUCH INSTRU U-SHP 10X18 (DRAPES) ×2 IMPLANT
DRAPE U-SHAPE 47X51 STRL (DRAPES) ×2 IMPLANT
DRSG ADAPTIC 3X8 NADH LF (GAUZE/BANDAGES/DRESSINGS) ×2 IMPLANT
DRSG PAD ABDOMINAL 8X10 ST (GAUZE/BANDAGES/DRESSINGS) ×2 IMPLANT
DURAPREP 26ML APPLICATOR (WOUND CARE) ×2 IMPLANT
ELECT REM PT RETURN 9FT ADLT (ELECTROSURGICAL) ×2
ELECTRODE REM PT RTRN 9FT ADLT (ELECTROSURGICAL) ×1 IMPLANT
EVACUATOR 1/8 PVC DRAIN (DRAIN) ×2 IMPLANT
FACESHIELD LNG OPTICON STERILE (SAFETY) ×10 IMPLANT
GLOVE BIO SURGEON STRL SZ8 (GLOVE) ×2 IMPLANT
GLOVE BIOGEL PI IND STRL 8 (GLOVE) ×2 IMPLANT
GLOVE BIOGEL PI INDICATOR 8 (GLOVE) ×2
GLOVE ECLIPSE 8.0 STRL XLNG CF (GLOVE) ×2 IMPLANT
GLOVE SURG SS PI 6.5 STRL IVOR (GLOVE) IMPLANT
GOWN STRL NON-REIN LRG LVL3 (GOWN DISPOSABLE) ×2 IMPLANT
GOWN STRL REIN XL XLG (GOWN DISPOSABLE) ×4 IMPLANT
HANDPIECE INTERPULSE COAX TIP (DISPOSABLE) ×1
IMMOBILIZER KNEE 20 (SOFTGOODS) ×2
IMMOBILIZER KNEE 20 THIGH 36 (SOFTGOODS) ×1 IMPLANT
KIT BASIN OR (CUSTOM PROCEDURE TRAY) ×2 IMPLANT
MANIFOLD NEPTUNE II (INSTRUMENTS) ×2 IMPLANT
NS IRRIG 1000ML POUR BTL (IV SOLUTION) ×2 IMPLANT
PACK TOTAL JOINT (CUSTOM PROCEDURE TRAY) ×2 IMPLANT
PAD ABD 7.5X8 STRL (GAUZE/BANDAGES/DRESSINGS) ×2 IMPLANT
PADDING CAST COTTON 6X4 STRL (CAST SUPPLIES) ×2 IMPLANT
POSITIONER SURGICAL ARM (MISCELLANEOUS) ×2 IMPLANT
SET HNDPC FAN SPRY TIP SCT (DISPOSABLE) ×1 IMPLANT
SPONGE GAUZE 4X4 12PLY (GAUZE/BANDAGES/DRESSINGS) ×2 IMPLANT
STRIP CLOSURE SKIN 1/2X4 (GAUZE/BANDAGES/DRESSINGS) ×4 IMPLANT
SUCTION FRAZIER 12FR DISP (SUCTIONS) ×2 IMPLANT
SUT MNCRL AB 4-0 PS2 18 (SUTURE) ×2 IMPLANT
SUT VIC AB 2-0 CT1 27 (SUTURE) ×3
SUT VIC AB 2-0 CT1 TAPERPNT 27 (SUTURE) ×3 IMPLANT
SUT VLOC 180 0 24IN GS25 (SUTURE) ×2 IMPLANT
TOWEL OR 17X26 10 PK STRL BLUE (TOWEL DISPOSABLE) ×4 IMPLANT
TRAY FOLEY CATH 14FRSI W/METER (CATHETERS) ×2 IMPLANT
WATER STERILE IRR 1500ML POUR (IV SOLUTION) ×2 IMPLANT
WRAP KNEE MAXI GEL POST OP (GAUZE/BANDAGES/DRESSINGS) ×4 IMPLANT

## 2012-08-04 NOTE — Progress Notes (Signed)
1745 Pt used  her PCA morphine x1 then she started to verbalize of feeling hot, became diaphoretic & dizzy.  V/S: 73/39, 50, 98.3, 12 100%2l O2 [place]. MD on call paged to notify incident.  At 1800 On call-Brad Dixon,PA called back & notified of above assessments. New order given to d/c PCA; Oxy IR, Dilaudid IV & NS 250cc bolus then recheck BP. Call on call MD back if BP <90.

## 2012-08-04 NOTE — Op Note (Signed)
Pre-operative diagnosis- Osteoarthritis  Left knee(s)  Post-operative diagnosis- Osteoarthritis Left knee(s)  Procedure-  Left  Total Knee Arthroplasty  Surgeon- Gus Rankin. Benedicta Sultan, MD  Assistant- Tech assist   Anesthesia-  Spinal EBL-* No blood loss amount entered *  Drains Hemovac  Tourniquet time-  Total Tourniquet Time Documented: Thigh (Left) - 42 minutes   Complications- None  Condition-PACU - hemodynamically stable.   Brief Clinical Note  Yesenia Reynolds is a 71 y.o. year old female with end stage OA of her left knee with progressively worsening pain and dysfunction. She has constant pain, with activity and at rest and significant functional deficits with difficulties even with ADLs. She has had extensive non-op management including analgesics, injections of cortisone and viscosupplements, and home exercise program, but remains in significant pain with significant dysfunction. Radiographs show bone on bone arthritis all 3 compartments with severe varus deformity. She presents now for left Total Knee Arthroplasty.    Procedure in detail---   The patient is brought into the operating room and positioned supine on the operating table. After successful administration of  Spinal,   a tourniquet is placed high on the  Left thigh(s) and the lower extremity is prepped and draped in the usual sterile fashion. Time out is performed by the operating team and then the  Left lower extremity is wrapped in Esmarch, knee flexed and the tourniquet inflated to 300 mmHg.       A midline incision is made with a ten blade through the subcutaneous tissue to the level of the extensor mechanism. A fresh blade is used to make a medial parapatellar arthrotomy. Soft tissue over the proximal medial tibia is subperiosteally elevated to the joint line with a knife and into the semimembranosus bursa with a Cobb elevator. Soft tissue over the proximal lateral tibia is elevated with attention being paid to avoiding  the patellar tendon on the tibial tubercle. The patella is everted, knee flexed 90 degrees and the ACL and PCL are removed. Findings are bone on bone all 3 compartments with massive globaL osteophytes.        The drill is used to create a starting hole in the distal femur and the canal is thoroughly irrigated with sterile saline to remove the fatty contents. The 5 degree Left  valgus alignment guide is placed into the femoral canal and the distal femoral cutting block is pinned to remove 10 mm off the distal femur. Resection is made with an oscillating saw.      The tibia is subluxed forward and the menisci are removed. The extramedullary alignment guide is placed referencing proximally at the medial aspect of the tibial tubercle and distally along the second metatarsal axis and tibial crest. The block is pinned to remove 2mm off the more deficient medial  side. Resection is made with an oscillating saw. Size 3is the most appropriate size for the tibia and the proximal tibia is prepared with the modular drill and keel punch for that size.      The femoral sizing guide is placed and size 3 is most appropriate. Rotation is marked off the epicondylar axis and confirmed by creating a rectangular flexion gap at 90 degrees. The size 3 cutting block is pinned in this rotation and the anterior, posterior and chamfer cuts are made with the oscillating saw. The intercondylar block is then placed and that cut is made.      Trial size 3 tibial component, trial size 3 posterior stabilized femur and  a 12.5  mm posterior stabilized rotating platform insert trial is placed. Full extension is achieved with excellent varus/valgus and anterior/posterior balance throughout full range of motion. The patella is everted and thickness measured to be 22  mm. Free hand resection is taken to 12 mm, a 35 template is placed, lug holes are drilled, trial patella is placed, and it tracks normally. Osteophytes are removed off the posterior  femur with the trial in place. All trials are removed and the cut bone surfaces prepared with pulsatile lavage. Cement is mixed and once ready for implantation, the size 3 tibial implant, size  3 posterior stabilized femoral component, and the size 35 patella are cemented in place and the patella is held with the clamp. The trial insert is placed and the knee held in full extension. All extruded cement is removed and once the cement is hard the permanent 12.5 mm posterior stabilized rotating platform insert is placed into the tibial tray.      The wound is copiously irrigated with saline solution and the extensor mechanism closed over a hemovac drain with #1 PDS suture. The tourniquet is released for a total tourniquet time of 42  minutes. Flexion against gravity is 140 degrees and the patella tracks normally. Subcutaneous tissue is closed with 2.0 vicryl and subcuticular with running 4.0 Monocryl. The catheter for the Marcaine pain pump is placed and the pump is initiated. The incision is cleaned and dried and steri-strips and a bulky sterile dressing are applied. The limb is placed into a knee immobilizer and the patient is awakened and transported to recovery in stable condition.      Please note that a surgical assistant was a medical necessity for this procedure in order to perform it in a safe and expeditious manner. Surgical assistant was necessary to retract the ligaments and vital neurovascular structures to prevent injury to them and also necessary for proper positioning of the limb to allow for anatomic placement of the prosthesis.   Gus Rankin Kanton Kamel, MD    08/04/2012, 2:02 PM

## 2012-08-04 NOTE — Anesthesia Procedure Notes (Signed)
Spinal  Patient location during procedure: OR Staffing Anesthesiologist: Azell Der Performed by: anesthesiologist  Preanesthetic Checklist Completed: patient identified, site marked, surgical consent, pre-op evaluation, timeout performed, IV checked, risks and benefits discussed and monitors and equipment checked Spinal Block Patient position: sitting Prep: Betadine Patient monitoring: heart rate, cardiac monitor, continuous pulse ox and blood pressure Approach: midline Location: L3-4 Injection technique: single-shot Needle Needle type: Quincke  Needle gauge: 22 G Additional Notes No paresthesia. Tolerated well. Clear CSF.

## 2012-08-04 NOTE — H&P (View-Only) (Signed)
Yesenia Reynolds  DOB: [birth year] Single / Language: [place] / Race: White Female  Date of Admission:  08/04/2012  Chief Complaint:  Left Knee Pain  History of Present Illness The patient is a 71 year old female who comes in for a preoperative History and Physical. The patient is scheduled for a left total knee arthroplasty to be performed by Dr. Gus Rankin. Aluisio, MD at [facility] on 08/04/2012. The patient is a 71 year old female who presents with knee complaints. The patient reports left knee symptoms including: pain which began year(s) ago without any known injury.The patient feels that the symptoms are unchanging. Past treatment for this problem has included intra-articular injection of corticosteroids (years ago). Symptoms are reported to be located in the left knee and include knee pain (popping and clicking), swelling and instability. The patient does not report any radiation of symptoms. The patient describes their pain as dull and aching. Onset of symptoms was with symptoms now occurring frequently. The episodes occur daily. Symptoms are exacerbated by weight bearing and walking. The knee is bothering her with all activities. It generally does better with rest. It is definitely limiting what she can and cannot do. She would like to be a more avid walker but cannot do so because of the knee. Both knees hurt, but the left is more symptomatic than the right. She's at a stage now where she feels she needs to get something done with this knee. It's affecting everything she does. Pain is definitely worsening.  She is ready for surgery. They have been treated conservatively in the past for the above stated problem and despite conservative measures, they continue to have progressive pain and severe functional limitations and dysfunction. They have failed non-operative management. It is felt that they would benefit from undergoing total joint replacement. Risks and benefits  of the procedure have been discussed with the patient and they elect to proceed with surgery. There are no active contraindications to surgery such as ongoing infection or rapidly progressive neurological disease.     Allergies No Known Drug Allergies   Family History Cerebrovascular Accident. mother, grandmother mothers side and grandfather fathers side mother and grandmother mothers side Congestive Heart Failure. mother Heart Disease. father father, grandfather mothers side and grandfather fathers side Hypertension. mother Osteoarthritis. mother and father   Social History Alcohol use. never consumed alcohol Children. 0 Current work status. retired Financial planner (Currently). no Exercise. Exercises daily Illicit drug use. no Marital status. single Number of flights of stairs before winded. 1 Pain Contract. no Previously in rehab. no Tobacco / smoke exposure. no Tobacco use. never smoker   Medication History Atorvastatin Calcium (10MG  Tablet, Oral) Active. (start in a week) Lisinopril (10MG  Tablet, Oral) Active. Advil (200MG  Capsule, 1 (one) Oral) Active. Tylenol (325MG  Tablet, 1 (one) Oral) Active. Vitamin C (1 (one) Oral) Specific dose unknown - Active. Vitamin D (1 (one) Oral) Specific dose unknown - Active. Vitamin E (1 (one) Oral) Specific dose unknown - Active. Levothyroxine Sodium ( Tablet, Oral) Active. Latanoprost (0.005% Solution, Ophthalmic) Active. Timoptic Ocudose (0.5% Solution, Ophthalmic) Active. Aspirin (81MG  Tablet, 1 (one) Oral) Active. Metoprolol Succinate ER (25MG  Tablet ER 24HR, Oral) Active.   Pregnancy / Birth History Pregnant. no   Past Surgical History Tonsillectomy Breast Mass; Local Excision. bilateral Breast Biopsy. bilateral  Past Medical History Anxiety Disorder Diabetes Mellitus, Type II High blood pressure Hypercholesterolemia Hypothyroidism Osteoarthritis   Review of  Systems General:Not Present- Chills, Fever, Night Sweats, Fatigue, Weight Gain,  Weight Loss and Memory Loss. Skin:Not Present- Hives, Itching, Rash, Eczema and Lesions. HEENT:Not Present- Tinnitus, Headache, Double Vision, Visual Loss, Hearing Loss and Dentures. Respiratory:Not Present- Shortness of breath with exertion, Shortness of breath at rest, Allergies, Coughing up blood and Chronic Cough. Cardiovascular:Not Present- Chest Pain, Racing/skipping heartbeats, Difficulty Breathing Lying Down, Murmur, Swelling and Palpitations. Gastrointestinal:Not Present- Bloody Stool, Heartburn, Abdominal Pain, Vomiting, Nausea, Constipation, Diarrhea, Difficulty Swallowing, Jaundice and Loss of appetitie. Female Genitourinary:Not Present- Blood in Urine, Urinary frequency, Weak urinary stream, Discharge, Flank Pain, Incontinence, Painful Urination, Urgency, Urinary Retention and Urinating at Night. Musculoskeletal:Present- Joint Swelling, Joint Pain, Back Pain and Morning Stiffness. Not Present- Muscle Weakness, Muscle Pain and Spasms. Neurological:Not Present- Tremor, Dizziness, Blackout spells, Paralysis, Difficulty with balance and Weakness. Psychiatric:Not Present- Insomnia.   Vitals Weight: 180 lb Height: 61 in Body Surface Area: 1.87 m Body Mass Index: 34.01 kg/m Pulse: 76 (Regular) Resp.: 12 (Unlabored) BP: 162/82 (Sitting, Right Arm, Standard)    Physical Exam The physical exam findings are as follows:  Patient is accompanied today by a friend.   General Mental Status - Alert, cooperative and good historian. General Appearance- pleasant. Not in acute distress. Orientation- Oriented X3. Build & Nutrition- Well nourished and Well developed.   Head and Neck Head- normocephalic, atraumatic . Neck Global Assessment- supple. no bruit auscultated on the right and no bruit auscultated on the left.   Eye Pupil- Bilateral- Regular and Round. Motion-  Bilateral- EOMI.   Chest and Lung Exam Auscultation: Breath sounds:- clear at anterior chest wall and - clear at posterior chest wall. Adventitious sounds:- No Adventitious sounds.   Cardiovascular Auscultation:Rhythm- Regular rate and rhythm. Heart Sounds- S1 WNL and S2 WNL. Murmurs & Other Heart Sounds:Auscultation of the heart reveals - No Murmurs.   Abdomen Palpation/Percussion:Tenderness- Abdomen is non-tender to palpation. Rigidity (guarding)- Abdomen is soft. Auscultation:Auscultation of the abdomen reveals - Bowel sounds normal.   Female Genitourinary  Not done, not pertinent to present illness  Musculoskeletal On examination, she's alert and oriented, in no apparent distress. Her left hip and right hip both show normal ROM with no discomfort. Right knee, no effusion. Range 5-125. Moderate crepitus on ROM. Slight tenderness, medial greater than lateral. No instability. Left knee: Significant varus, 10 degree flexion contracture, flexion to 110, marked crepitus on ROM, tenderness medial greater than lateral with no instability. She's got a varus deformity also.  RADIOGRAPHS: AP of both knees and lateral show severe end stage arthritis of the left knee bone on bone medial and patellofemoral with tibial subluxation. Right knee shows very mild change.  Assessment & Plan Osteoarthritis, Knee (715.96) Impression: Left Knee  Note: Patient is for a Left Total Knee Replacement by Dr. Lequita Halt.  Plan is to go home.  PCP - Dr. Doreen Beam  Signed electronically by Roberts Gaudy, PA-C

## 2012-08-04 NOTE — Preoperative (Signed)
Beta Blockers   Reason not to administer Beta Blockers:Metoprolol taken this am 08-04-12 at 0630

## 2012-08-04 NOTE — Interval H&P Note (Signed)
History and Physical Interval Note:  08/04/2012 12:24 PM  Yesenia Reynolds  has presented today for surgery, with the diagnosis of Osteoarthritis of the Left knee  The various methods of treatment have been discussed with the patient and family. After consideration of risks, benefits and other options for treatment, the patient has consented to  Procedure(s) (LRB): TOTAL KNEE ARTHROPLASTY (Left) as a surgical intervention .  The patient's history has been reviewed, patient examined, no change in status, stable for surgery.  I have reviewed the patient's chart and labs.  Questions were answered to the patient's satisfaction.     Loanne Drilling

## 2012-08-04 NOTE — Transfer of Care (Signed)
Immediate Anesthesia Transfer of Care Note  Patient: Yesenia Reynolds  Procedure(s) Performed: Procedure(s) (LRB): TOTAL KNEE ARTHROPLASTY (Left)  Patient Location: PACU  Anesthesia Type: MAC and Spinal  Level of Consciousness: awake, alert , oriented and patient cooperative  Airway & Oxygen Therapy: Patient Spontanous Breathing and Patient connected to face mask oxygen  Post-op Assessment: Report given to PACU RN and Post -op Vital signs reviewed and stable  Post vital signs: Reviewed and stable  Complications: No apparent anesthesia complications

## 2012-08-04 NOTE — Anesthesia Preprocedure Evaluation (Addendum)
Anesthesia Evaluation  Patient identified by MRN, date of birth, ID band Patient awake    Reviewed: Allergy & Precautions, H&P , NPO status , Patient's Chart, lab work & pertinent test results  History of Anesthesia Complications (+) AWARENESS UNDER ANESTHESIA  Airway Mallampati: II TM Distance: >3 FB Neck ROM: Full    Dental No notable dental hx.    Pulmonary neg pulmonary ROS,  breath sounds clear to auscultation  Pulmonary exam normal       Cardiovascular hypertension, Pt. on home beta blockers and Pt. on medications Rhythm:Regular Rate:Normal  Reviewed recent cardiology clearance note. Diastolic dysfunction with some valvular regurgitation.   Neuro/Psych negative neurological ROS  negative psych ROS   GI/Hepatic Neg liver ROS, GERD-  Medicated,  Endo/Other  negative endocrine ROSHypothyroidism   Renal/GU negative Renal ROS  negative genitourinary   Musculoskeletal negative musculoskeletal ROS (+)   Abdominal (+) + obese,   Peds negative pediatric ROS (+)  Hematology negative hematology ROS (+)   Anesthesia Other Findings   Reproductive/Obstetrics negative OB ROS                          Anesthesia Physical Anesthesia Plan  ASA: II  Anesthesia Plan: Spinal   Post-op Pain Management:    Induction: Intravenous  Airway Management Planned: Simple Face Mask  Additional Equipment:   Intra-op Plan:   Post-operative Plan: Extubation in OR  Informed Consent: I have reviewed the patients History and Physical, chart, labs and discussed the procedure including the risks, benefits and alternatives for the proposed anesthesia with the patient or authorized representative who has indicated his/her understanding and acceptance.   Dental advisory given  Plan Discussed with: CRNA  Anesthesia Plan Comments: (Discussed risks/benefits of spinal including headache, backache, failure, bleeding,  infection, and nerve damage. Patient consents to spinal. Questions answered. Coagulation studies and platelet count acceptable. )       Anesthesia Quick Evaluation

## 2012-08-04 NOTE — Progress Notes (Signed)
Utilization review completed.  

## 2012-08-05 ENCOUNTER — Encounter (HOSPITAL_COMMUNITY): Payer: Self-pay | Admitting: Orthopedic Surgery

## 2012-08-05 DIAGNOSIS — D62 Acute posthemorrhagic anemia: Secondary | ICD-10-CM | POA: Diagnosis not present

## 2012-08-05 LAB — CBC
HCT: 29.2 % — ABNORMAL LOW (ref 36.0–46.0)
MCH: 28.8 pg (ref 26.0–34.0)
MCV: 86.6 fL (ref 78.0–100.0)
Platelets: 225 10*3/uL (ref 150–400)
RDW: 12.8 % (ref 11.5–15.5)
WBC: 8.7 10*3/uL (ref 4.0–10.5)

## 2012-08-05 LAB — BASIC METABOLIC PANEL
BUN: 11 mg/dL (ref 6–23)
Calcium: 8.3 mg/dL — ABNORMAL LOW (ref 8.4–10.5)
Chloride: 100 mEq/L (ref 96–112)
Creatinine, Ser: 0.71 mg/dL (ref 0.50–1.10)
GFR calc Af Amer: >90 mL/min (ref >90)

## 2012-08-05 NOTE — Progress Notes (Signed)
Physical Therapy Treatment Patient Details Name: Yesenia Reynolds MRN: [id number] DOB: 1941/06/21 Today's Date: 08/05/2012 Time: 0865-7846 PT Time Calculation (min): 29 min  PT Assessment / Plan / Recommendation Comments on Treatment Session  Pt ltd by c/o dizziness.  BP on sitting 122/69    Follow Up Recommendations  Home health PT    Barriers to Discharge        Equipment Recommendations  Rolling walker with 5" wheels    Recommendations for Other Services    Frequency 7X/week   Plan Discharge plan remains appropriate    Precautions / Restrictions Precautions Precautions: Knee Required Braces or Orthoses: Knee Immobilizer - Left Knee Immobilizer - Left: Discontinue once straight leg raise with < 10 degree lag Restrictions Weight Bearing Restrictions: No LLE Weight Bearing: Weight bearing as tolerated   Pertinent Vitals/Pain 3-4/10; premedicated    Mobility  Bed Mobility Bed Mobility: Sit to Supine Sit to Supine: 4: Min assist;3: Mod assist Details for Bed Mobility Assistance: cues for sequence and for use of UEs and R LE to self assist Transfers Transfers: Sit to Stand;Stand to Sit Sit to Stand: 4: Min assist;3: Mod assist Stand to Sit: 4: Min assist;3: Mod assist Details for Transfer Assistance: cues for use of UEs and for LE management Ambulation/Gait Ambulation Distance (Feet): 26 Feet (and 20) Assistive device: Rolling walker Ambulation/Gait Assistance Details: cues for sequence and position from RW ; ltd by pt c/o increased dizziness  Gait Pattern: Step-to pattern    Exercises     PT Diagnosis:    PT Problem List:   PT Treatment Interventions:     PT Goals Acute Rehab PT Goals PT Goal Formulation: With patient Time For Goal Achievement: 08/11/12 Potential to Achieve Goals: Good Pt will go Supine/Side to Sit: with supervision PT Goal: Supine/Side to Sit - Progress: Goal set today Pt will go Sit to Supine/Side: with supervision PT Goal: Sit to  Supine/Side - Progress: Goal set today Pt will go Sit to Stand: with supervision PT Goal: Sit to Stand - Progress: Progressing toward goal Pt will go Stand to Sit: with supervision PT Goal: Stand to Sit - Progress: Progressing toward goal Pt will Ambulate: 51 - 150 feet;with supervision;with rolling walker PT Goal: Ambulate - Progress: Progressing toward goal Pt will Go Up / Down Stairs: 1-2 stairs;with min assist;with least restrictive assistive device PT Goal: Up/Down Stairs - Progress: Goal set today  Visit Information  Last PT Received On: 08/05/12 Assistance Needed: +1    Subjective Data  Subjective: PT c/o dizziness with OOB activity Patient Stated Goal: Resume previous lifestyle with decreased pain   Cognition  Overall Cognitive Status: Appears within functional limits for tasks assessed/performed Arousal/Alertness: Awake/alert Orientation Level: Appears intact for tasks assessed Behavior During Session: [facility] for tasks performed    Balance     End of Session PT - End of Session Equipment Utilized During Treatment: Left knee immobilizer Activity Tolerance: Other (comment) (dizziness) Patient left: in bed;with call bell/phone within reach Nurse Communication: Mobility status   GP     Yesenia Reynolds 08/05/2012, 4:13 PM

## 2012-08-05 NOTE — Anesthesia Postprocedure Evaluation (Signed)
  Anesthesia Post-op Note  Patient: Yesenia Reynolds  Procedure(s) Performed: Procedure(s) (LRB): TOTAL KNEE ARTHROPLASTY (Left)  Patient Location: PACU  Anesthesia Type: Spinal  Level of Consciousness: awake and alert   Airway and Oxygen Therapy: Patient Spontanous Breathing  Post-op Pain: mild  Post-op Assessment: Post-op Vital signs reviewed, Patient's Cardiovascular Status Stable, Respiratory Function Stable, Patent Airway and No signs of Nausea or vomiting  Post-op Vital Signs: stable  Complications: No apparent anesthesia complications

## 2012-08-05 NOTE — Evaluation (Signed)
Physical Therapy Evaluation Patient Details Name: Yesenia Reynolds MRN: [id number] DOB: 1941-01-15 Today's Date: 08/05/2012 Time: 1191-4782 PT Time Calculation (min): 31 min  PT Assessment / Plan / Recommendation Clinical Impression  Pt s/p L TKR presents with decreased L LE strength/ROM and limitations in functional mobility.    PT Assessment  Patient needs continued PT services    Follow Up Recommendations  Home health PT    Barriers to Discharge        Equipment Recommendations  Rolling walker with 5" wheels    Recommendations for Other Services     Frequency 7X/week    Precautions / Restrictions Precautions Precautions: Knee Required Braces or Orthoses: Knee Immobilizer - Left Knee Immobilizer - Left: Discontinue once straight leg raise with < 10 degree lag Restrictions Weight Bearing Restrictions: No LLE Weight Bearing: Weight bearing as tolerated   Pertinent Vitals/Pain 5/10, premedicated, cold packs provided      Mobility  Bed Mobility Bed Mobility: Supine to Sit Supine to Sit: 3: Mod assist Details for Bed Mobility Assistance: cues for sequence and for use of UEs and R LE to self assist Transfers Transfers: Sit to Stand;Stand to Sit Sit to Stand: 1: +2 Total assist Sit to Stand: Patient Percentage: 60% Stand to Sit: 1: +2 Total assist Stand to Sit: Patient Percentage: 60% Details for Transfer Assistance: cues for use of UEs and for LE management Ambulation/Gait Ambulation/Gait Assistance: 1: +2 Total assist Ambulation/Gait: Patient Percentage: 70% Ambulation Distance (Feet): 15 Feet Assistive device: Rolling walker Ambulation/Gait Assistance Details: cues for posture, sequence, position from RW and stride length; ltd by c/o dizziness Gait Pattern: Step-to pattern    Exercises Total Joint Exercises Ankle Circles/Pumps: AROM;Both;10 reps;Supine Quad Sets: AROM;10 reps;Both;Supine Heel Slides: AAROM;10 reps;Supine;Left Straight Leg Raises: AAROM;10  reps;Supine;Left   PT Diagnosis: Difficulty walking  PT Problem List: Decreased strength;Decreased range of motion;Decreased activity tolerance;Decreased mobility;Decreased knowledge of use of DME;Pain;Decreased knowledge of precautions PT Treatment Interventions: DME instruction;Gait training;Stair training;Functional mobility training;Therapeutic activities;Therapeutic exercise;Patient/family education   PT Goals Acute Rehab PT Goals PT Goal Formulation: With patient Time For Goal Achievement: 08/11/12 Potential to Achieve Goals: Good Pt will go Supine/Side to Sit: with supervision PT Goal: Supine/Side to Sit - Progress: Goal set today Pt will go Sit to Supine/Side: with supervision PT Goal: Sit to Supine/Side - Progress: Goal set today Pt will go Sit to Stand: with supervision PT Goal: Sit to Stand - Progress: Goal set today Pt will go Stand to Sit: with supervision PT Goal: Stand to Sit - Progress: Goal set today Pt will Ambulate: 51 - 150 feet;with supervision;with rolling walker PT Goal: Ambulate - Progress: Goal set today Pt will Go Up / Down Stairs: 1-2 stairs;with min assist;with least restrictive assistive device PT Goal: Up/Down Stairs - Progress: Goal set today  Visit Information  Last PT Received On: 08/05/12 Assistance Needed: +2    Subjective Data  Subjective: I had a rough night Patient Stated Goal: Resume previous lifestyle with decreased pain   Prior Functioning  Home Living Lives With: Alone Available Help at Discharge: Friend(s) Type of Home: House Home Access: Stairs to enter Secretary/administrator of Steps: 1 Home Layout: One level Prior Function Level of Independence: Independent with assistive device(s) Able to Take Stairs?: Yes Communication Communication: No difficulties    Cognition  Overall Cognitive Status: Appears within functional limits for tasks assessed/performed Arousal/Alertness: Awake/alert Orientation Level: Appears intact for  tasks assessed Behavior During Session: [facility] for tasks performed  Extremity/Trunk Assessment Right Upper Extremity Assessment RUE ROM/Strength/Tone: WFL for tasks assessed Left Upper Extremity Assessment LUE ROM/Strength/Tone: WFL for tasks assessed Right Lower Extremity Assessment RLE ROM/Strength/Tone: WFL for tasks assessed Left Lower Extremity Assessment LLE ROM/Strength/Tone: Deficits LLE ROM/Strength/Tone Deficits: quads 2/5; aarom at knee -10 - 40   Balance    End of Session PT - End of Session Equipment Utilized During Treatment: Left knee immobilizer Activity Tolerance: Other (comment) (ltd by c/o dizziness) Patient left: in chair;with call bell/phone within reach;with family/visitor present Nurse Communication: Mobility status  GP     Amour Trigg 08/05/2012, 1:17 PM

## 2012-08-05 NOTE — Care Management Note (Unsigned)
    Page 1 of 1   08/05/2012     3:03:50 PM   CARE MANAGEMENT NOTE 08/05/2012  Patient:  Yesenia Reynolds, Yesenia Reynolds   Account Number:  [id number]  Date Initiated:  08/05/2012  Documentation initiated by:  CRAFT,TERRI  Subjective/Objective Assessment:   71 yo female admitted 08/04/12 with osteoarthritis of left knee     Action/Plan:   D/C when medically stable   Anticipated DC Date:  08/08/2012   Anticipated DC Plan:  HOME W HOME HEALTH SERVICES      DC Planning Services  CM consult      [place] [facility]  DURABLE MEDICAL EQUIPMENT  HOME HEALTH   Choice offered to / List presented to:  C-1 Patient   DME arranged  Levan Hurst      DME agency  [facility]     HH arranged  HH-2 PT      [facility] agency  [facility]   Status of service:  In process, will continue to follow  Per UR Regulation:  Reviewed for med. necessity/level of care/duration of stay  Comments:  08/05/12, Kathi Der RNC-MNN, BSN, [phone], CM received referral.  CM met with pt to offer choice for [facility] services. Pt chose [facility].  Darl Pikes at [facility] contacted with order and confirmation of services received.  Lucretia at [facility] contacted with DME order and confirmation received. Pt has several friends that will be assisting her at d/c.

## 2012-08-05 NOTE — Progress Notes (Signed)
   Subjective: 1 Day Post-Op Procedure(s) (LRB): TOTAL KNEE ARTHROPLASTY (Left) Patient reports pain as mild and moderate.   Patient seen in rounds by Dr. Lequita Halt. Patient is well, but has had some minor complaints of pain in the knee, requiring pain medications. Pt used her PCA morphine x1 then she started to verbalize of feeling hot, became diaphoretic & dizzy. V/S: 73/39, 50, 98.3, 12 100%2l O2 [place]. MD on call paged to notify incident. On call-Brad Dixon,PA called back & notified of above assessments. New order given to d/c PCA; Oxy IR, Dilaudid IV & NS 250cc bolus then recheck BP. Call on call MD back if BP <90.  Doing better thte next morning when seen by Dr. Lequita Halt. We will start therapy today.  Plan is to go Home after hospital stay.  Objective: Vital signs in last 24 hours: Temp:  [97.6 F (36.4 C)-99.3 F (37.4 C)] 98.8 F (37.1 C) (08/29 0646) Pulse Rate:  [50-83] 81  (08/29 0646) Resp:  [12-15] 14  (08/29 0753) BP: (73-138)/(39-77) 122/73 mmHg (08/29 0646) SpO2:  [93 %-100 %] 93 % (08/29 0753) Weight:  [80.74 kg (178 lb)] 80.74 kg (178 lb) (08/28 1711)  Intake/Output from previous day:  Intake/Output Summary (Last 24 hours) at 08/05/12 1025 Last data filed at 08/05/12 0845  Gross per 24 hour  Intake 4690.84 ml  Output   1460 ml  Net 3230.84 ml    Intake/Output this shift: Total I/O In: 360 [P.O.:360] Out: -   Labs:  Basename 08/05/12 0418  HGB 9.7*    Basename 08/05/12 0418  WBC 8.7  RBC 3.37*  HCT 29.2*  PLT 225    Basename 08/05/12 0418  NA 137  K 4.1  CL 100  CO2 30  BUN 11  CREATININE 0.71  GLUCOSE 162*  CALCIUM 8.3*   No results found for this basename: LABPT:2,INR:2 in the last 72 hours  EXAM General - Patient is Alert, Appropriate and Oriented Extremity - Neurovascular intact Sensation intact distally Dorsiflexion/Plantar flexion intact Dressing - dressing C/D/I Motor Function - intact, moving foot and toes well on exam.  Hemovac  pulled without difficulty.  Past Medical History  Diagnosis Date  . Mixed hyperlipidemia   . Hypothyroidism   . Essential hypertension, benign   . Arthritis   . Carotid artery disease     Less than 50% ICA stenoses 6/13  . Type 2 diabetes mellitus     diet controlled   . GERD (gastroesophageal reflux disease)   . Spinal stenosis     Assessment/Plan: 1 Day Post-Op Procedure(s) (LRB): TOTAL KNEE ARTHROPLASTY (Left) Principal Problem:  *OA (osteoarthritis) of knee Active Problems:  Postop Acute blood loss anemia   Advance diet Up with therapy Discharge home with home health  DVT Prophylaxis - Xarelto, 81 mg Aspirin on hold for now. Weight-Bearing as tolerated to left leg No vaccines. D/C PCA Morphine, Change to IV push D/C O2 and Pulse OX and try on Room Air  Trevyon Swor 08/05/2012, 10:25 AM

## 2012-08-06 LAB — CBC
HCT: 25.9 % — ABNORMAL LOW (ref 36.0–46.0)
HCT: 26.1 % — ABNORMAL LOW (ref 36.0–46.0)
MCH: 28.7 pg (ref 26.0–34.0)
MCHC: 33.2 g/dL (ref 30.0–36.0)
MCHC: 33 g/dL (ref 30.0–36.0)
MCV: 86.6 fL (ref 78.0–100.0)
MCV: 87 fL (ref 78.0–100.0)
Platelets: 189 10*3/uL (ref 150–400)
Platelets: 175 10*3/uL (ref 150–400)
RDW: 12.7 % (ref 11.5–15.5)
RDW: 13 % (ref 11.5–15.5)
WBC: 8.4 10*3/uL (ref 4.0–10.5)

## 2012-08-06 LAB — BASIC METABOLIC PANEL
BUN: 14 mg/dL (ref 6–23)
Creatinine, Ser: 0.74 mg/dL (ref 0.50–1.10)
GFR calc Af Amer: >90 mL/min (ref >90)
GFR calc non Af Amer: 84 mL/min — ABNORMAL LOW (ref >90)
Potassium: 4.3 mEq/L (ref 3.5–5.1)

## 2012-08-06 MED ORDER — POLYSACCHARIDE IRON COMPLEX 150 MG PO CAPS: 150.0000 mg | ORAL_CAPSULE | Freq: Two times a day (BID) | ORAL | Status: DC

## 2012-08-06 MED ORDER — RIVAROXABAN 10 MG PO TABS: 10.0000 mg | ORAL_TABLET | Freq: Every day | ORAL | Status: DC

## 2012-08-06 MED ORDER — POLYSACCHARIDE IRON COMPLEX 150 MG PO CAPS
150.0000 mg | ORAL_CAPSULE | Freq: Two times a day (BID) | ORAL | Status: DC
  Administered 2012-08-06 – 2012-08-07 (×3): 150 mg via ORAL
  Filled 2012-08-06 (×4): qty 1

## 2012-08-06 MED ORDER — METHOCARBAMOL 500 MG PO TABS: 500.0000 mg | ORAL_TABLET | Freq: Four times a day (QID) | ORAL | Status: AC | PRN

## 2012-08-06 MED ORDER — OXYCODONE HCL 5 MG PO TABS: 5.0000 mg | ORAL_TABLET | ORAL | Status: AC | PRN

## 2012-08-06 NOTE — Progress Notes (Signed)
Physical Therapy Treatment Patient Details Name: Yesenia Reynolds MRN: [id number] DOB: July 18, 1941 Today's Date: 08/06/2012 Time: 8295-6213 PT Time Calculation (min): 23 min  PT Assessment / Plan / Recommendation Comments on Treatment Session  Pt taking ltd pain meds (tylenol only this am)    Follow Up Recommendations  Home health PT    Barriers to Discharge        Equipment Recommendations  Rolling walker with 5" wheels    Recommendations for Other Services    Frequency 7X/week   Plan Discharge plan remains appropriate    Precautions / Restrictions Precautions Precautions: Knee Required Braces or Orthoses: Knee Immobilizer - Left Knee Immobilizer - Left: Discontinue once straight leg raise with < 10 degree lag Restrictions Weight Bearing Restrictions: No LLE Weight Bearing: Weight bearing as tolerated   Pertinent Vitals/Pain 7-8/10, MEDS requested, ice packs provided    Mobility       Exercises Total Joint Exercises Ankle Circles/Pumps: AROM;Both;Supine;20 reps Quad Sets: AROM;Both;Supine;20 reps Heel Slides: AAROM;20 reps;Supine;Left Straight Leg Raises: AAROM;Supine;Left;20 reps   PT Diagnosis:    PT Problem List:   PT Treatment Interventions:     PT Goals Acute Rehab PT Goals PT Goal Formulation: With patient Time For Goal Achievement: 08/11/12 Potential to Achieve Goals: Good Pt will go Supine/Side to Sit: with supervision  Visit Information  Last PT Received On: 08/06/12 Assistance Needed: +1    Subjective Data  Subjective: Pt continues c/o dizziness intermittantly Patient Stated Goal: Resume previous lifestyle with decreased pain   Cognition  Overall Cognitive Status: Appears within functional limits for tasks assessed/performed Arousal/Alertness: Awake/alert Orientation Level: Appears intact for tasks assessed Behavior During Session: [facility] for tasks performed    Balance     End of Session PT - End of Session Activity Tolerance: Patient  limited by pain Patient left: in chair;with call bell/phone within reach;with family/visitor present Nurse Communication: Patient requests pain meds CPM Left Knee CPM Left Knee: On   GP     Daltyn Degroat 08/06/2012, 4:54 PM

## 2012-08-06 NOTE — Discharge Summary (Signed)
Physician Discharge Summary   Patient ID: Yesenia Reynolds MRN: [id number] DOB/AGE: [birth year] 71 y.o.  Admit date: 08/04/2012 Discharge date: 08/07/2012  Primary Diagnosis: Osteoarthritis Left knee   Admission Diagnoses:  Past Medical History  Diagnosis Date  . Mixed hyperlipidemia   . Hypothyroidism   . Essential hypertension, benign   . Arthritis   . Carotid artery disease     Less than 50% ICA stenoses 6/13  . Type 2 diabetes mellitus     diet controlled   . GERD (gastroesophageal reflux disease)   . Spinal stenosis    Discharge Diagnoses:   Principal Problem:  *OA (osteoarthritis) of knee Active Problems:  Postop Acute blood loss anemia  Procedure:  Procedure(s) (LRB): TOTAL KNEE ARTHROPLASTY (Left)   Consults: None  HPI: Yesenia Reynolds is a 71 y.o. year old female with end stage OA of her left knee with progressively worsening pain and dysfunction. She has constant pain, with activity and at rest and significant functional deficits with difficulties even with ADLs. She has had extensive non-op management including analgesics, injections of cortisone and viscosupplements, and home exercise program, but remains in significant pain with significant dysfunction. Radiographs show bone on bone arthritis all 3 compartments with severe varus deformity. She presents now for left Total Knee Arthroplasty.    Laboratory Data: Hospital Outpatient Visit on 07/29/2012  Component Date Value Range Status  . aPTT 07/29/2012 32  24 - 37 seconds Final  . WBC 07/29/2012 6.4  4.0 - 10.5 K/uL Final  . RBC 07/29/2012 4.64  3.87 - 5.11 MIL/uL Final  . Hemoglobin 07/29/2012 13.4  12.0 - 15.0 g/dL Final  . HCT 78/29/5621 40.1  36.0 - 46.0 % Final  . MCV 07/29/2012 86.4  78.0 - 100.0 fL Final  . MCH 07/29/2012 28.9  26.0 - 34.0 pg Final  . MCHC 07/29/2012 33.4  30.0 - 36.0 g/dL Final  . RDW 30/86/5784 12.7  11.5 - 15.5 % Final  . Platelets 07/29/2012 265  150 - 400 K/uL Final  .  Sodium 07/29/2012 140  135 - 145 mEq/L Final  . Potassium 07/29/2012 4.0  3.5 - 5.1 mEq/L Final  . Chloride 07/29/2012 100  96 - 112 mEq/L Final  . CO2 07/29/2012 32  19 - 32 mEq/L Final  . Glucose, Bld 07/29/2012 101* 70 - 99 mg/dL Final  . BUN 69/62/9528 15  6 - 23 mg/dL Final  . Creatinine, Ser 07/29/2012 0.61  0.50 - 1.10 mg/dL Final  . Calcium 41/32/4401 9.7  8.4 - 10.5 mg/dL Final  . Total Protein 07/29/2012 7.5  6.0 - 8.3 g/dL Final  . Albumin 02/72/5366 4.3  3.5 - 5.2 g/dL Final  . AST 44/02/4741 16  0 - 37 U/L Final  . ALT 07/29/2012 15  0 - 35 U/L Final  . Alkaline Phosphatase 07/29/2012 94  39 - 117 U/L Final  . Total Bilirubin 07/29/2012 0.4  0.3 - 1.2 mg/dL Final  . GFR calc non Af Amer 07/29/2012 90* >90 mL/min Final  . GFR calc Af Amer 07/29/2012 >90  >90 mL/min Final   Comment:                                 The eGFR has been calculated                          using the CKD  EPI equation.                          This calculation has not been                          validated in all clinical                          situations.                          eGFR's persistently                          <90 mL/min signify                          possible Chronic Kidney Disease.  Marland Kitchen Prothrombin Time 07/29/2012 13.0  11.6 - 15.2 seconds Final  . INR 07/29/2012 0.96  0.00 - 1.49 Final  . Color, Urine 07/29/2012 YELLOW  YELLOW Final  . APPearance 07/29/2012 CLEAR  CLEAR Final  . Specific Gravity, Urine 07/29/2012 1.010  1.005 - 1.030 Final  . pH 07/29/2012 7.5  5.0 - 8.0 Final  . Glucose, UA 07/29/2012 NEGATIVE  NEGATIVE mg/dL Final  . Hgb urine dipstick 07/29/2012 TRACE* NEGATIVE Final  . Bilirubin Urine 07/29/2012 NEGATIVE  NEGATIVE Final  . Ketones, ur 07/29/2012 NEGATIVE  NEGATIVE mg/dL Final  . Protein, ur 16/09/9603 NEGATIVE  NEGATIVE mg/dL Final  . Urobilinogen, UA 07/29/2012 1.0  0.0 - 1.0 mg/dL Final  . Nitrite 54/08/8118 NEGATIVE  NEGATIVE Final  . Leukocytes,  UA 07/29/2012 MODERATE* NEGATIVE Final  . MRSA, PCR 07/29/2012 NEGATIVE  NEGATIVE Final  . Staphylococcus aureus 07/29/2012 NEGATIVE  NEGATIVE Final   Comment:                                 The Xpert SA Assay (FDA                          approved for NASAL specimens                          in patients over 75 years of age),                          is one component of                          a comprehensive surveillance                          program.  Test performance has                          been validated by [employer] for patients greater                          than or  equal to 83 year old.                          It is not intended                          to diagnose infection nor to                          guide or monitor treatment.  . Squamous Epithelial / LPF 07/29/2012 RARE  RARE Final  . WBC, UA 07/29/2012 3-6  <3 WBC/hpf Final  . RBC / HPF 07/29/2012 0-2  <3 RBC/hpf Final  . Bacteria, UA 07/29/2012 RARE  RARE Final    Basename 08/06/12 0419 08/05/12 0418  HGB 8.6* 9.7*    Basename 08/06/12 0419 08/05/12 0418  WBC 8.8 8.7  RBC 2.99* 3.37*  HCT 25.9* 29.2*  PLT 189 225    Basename 08/06/12 0419 08/05/12 0418  NA 136 137  K 4.3 4.1  CL 101 100  CO2 30 30  BUN 14 11  CREATININE 0.74 0.71  GLUCOSE 140* 162*  CALCIUM 8.5 8.3*   No results found for this basename: LABPT:2,INR:2 in the last 72 hours  X-Rays:Dg Chest 2 View  07/29/2012  *RADIOLOGY REPORT*  Clinical Data: Preoperative assessment for left TKR, history hypertension, diabetes  CHEST - 2 VIEW  Comparison: None  Findings: Upper-normal size of cardiac silhouette. Mediastinal contours and pulmonary vascularity normal. Minimal peribronchial thickening. Slight elevation right diaphragm. No pulmonary infiltrate, pleural effusion or pneumothorax. Multilevel endplate spur formation thoracic spine. Facet degenerative changes cervical spine.  IMPRESSION: Minimal bronchitic  changes.   Original Report Authenticated By: Lollie Marrow, M.D.     EKG: Orders placed in visit on 07/05/12  . EKG 12-LEAD     Hospital Course: Patient was admitted to [facility] and taken to the OR and underwent the above state procedure without complications.  Patient tolerated the procedure well and was later transferred to the recovery room and then to the orthopaedic floor for postoperative care.  They were given PO and IV analgesics for pain control following their surgery.  They were given 24 hours of postoperative antibiotics and started on DVT prophylaxis in the form of Xarelto.   PT and OT were ordered for total joint protocol.  Discharge planning consulted to help with postop disposition and equipment needs.  Patient had a tough night on the evening of surgery and started to get up OOB with therapy on day one.  Pt used her PCA morphine x1 then she started to verbalize of feeling hot, became diaphoretic & dizzy. V/S: 73/39, 50, 98.3, 12 100%2l O2 [place]. MD on call paged to notify incident. On call-Brad Dixon,PA called back & notified of above assessments. New order given to d/c PCA; Oxy IR, Dilaudid IV & NS 250cc bolus then recheck BP. Call on call MD back if BP <90. Doing better thte next morning when seen by Dr. Lequita Halt. Hemovac drain was pulled without difficulty.  Continued to work with therapy into day two.  Dressing was changed on day two and the incision was healing well.  By day three, the patient had progressed with therapy and meeting their goals.  Incision was healing well.  Patient was seen in rounds and was ready to go home.  Discharge Medications: Prior to Admission medications   Medication Sig Start Date  End Date Taking? Authorizing Provider  acetaminophen (TYLENOL) 500 MG tablet Take 500 mg by mouth 3 (three) times daily.   Yes Historical Provider, MD  atorvastatin (LIPITOR) 10 MG tablet Take 10 mg by mouth every evening.    Yes Historical Provider, MD  levothyroxine  (SYNTHROID, LEVOTHROID) 88 MCG tablet Take 88 mcg by mouth daily before breakfast.    Yes Historical Provider, MD  lisinopril (PRINIVIL,ZESTRIL) 10 MG tablet Take 10 mg by mouth every morning.    Yes Historical Provider, MD  metoprolol succinate (TOPROL-XL) 25 MG 24 hr tablet Take 12.5 mg by mouth every morning. 07/05/12 07/05/13 Yes Jonelle Sidle, MD  ranitidine (ZANTAC) 75 MG tablet Take 75 mg by mouth daily as needed. For heart burn   Yes Historical Provider, MD  iron polysaccharides (NIFEREX) 150 MG capsule Take 1 capsule (150 mg total) by mouth 2 (two) times daily. 08/06/12 08/06/13  Alexzandrew Perkins, PA  methocarbamol (ROBAXIN) 500 MG tablet Take 1 tablet (500 mg total) by mouth every 6 (six) hours as needed. 08/06/12 08/16/12  Alexzandrew Perkins, PA  oxyCODONE (OXY IR/ROXICODONE) 5 MG immediate release tablet Take 1-2 tablets (5-10 mg total) by mouth every 4 (four) hours as needed for pain. 08/06/12 08/16/12  Alexzandrew Julien Girt, PA  rivaroxaban (XARELTO) 10 MG TABS tablet Take 1 tablet (10 mg total) by mouth daily with breakfast. Take Xarelto for two and a half more weeks, then discontinue Xarelto. Once the patient has completed the Xarelto, they may resume the 81 mg Aspirin. 08/06/12   Alexzandrew Perkins, PA    Diet: Cardiac diet and Diabetic diet Activity:WBAT Follow-up:in 2 weeks Disposition - Home Discharged Condition: good   Discharge Orders    Future Appointments: Provider: Department: Dept Phone: Center:   09/06/2012 10:40 AM Jonelle Sidle, MD [facility] [phone] [facility]     Future Orders Please Complete By Expires   Diet - low sodium heart healthy      Call MD / Call 911      Comments:   If you experience chest pain or shortness of breath, CALL 911 and be transported to the hospital emergency room.  If you develope a fever above 101 F, pus (white drainage) or increased drainage or redness at the wound, or calf pain, call your surgeon's office.    Discharge instructions      Comments:   Pick up stool softner and laxative for home. Do not submerge incision under water. May shower. Continue to use ice for pain and swelling from surgery.  Take Xarelto for two and a half more weeks, then discontinue Xarelto. Once the patient has completed the Xarelto, they may resume the 81 mg Aspirin.   Constipation Prevention      Comments:   Drink plenty of fluids.  Prune juice may be helpful.  You may use a stool softener, such as Colace (over the counter) 100 mg twice a day.  Use MiraLax (over the counter) for constipation as needed.   Increase activity slowly as tolerated      Patient may shower      Comments:   You may shower without a dressing once there is no drainage.  Do not wash over the wound.  If drainage remains, do not shower until drainage stops.   Driving restrictions      Comments:   No driving until released by the physician.   Lifting restrictions      Comments:   No lifting until released by the physician.  TED hose      Comments:   Use stockings (TED hose) for 3 weeks on both leg(s).  You may remove them at night for sleeping.   Change dressing      Comments:   Change dressing daily with sterile 4 x 4 inch gauze dressing and apply TED hose. Do not submerge the incision under water.   Do not put a pillow under the knee. Place it under the heel.      Do not sit on low chairs, stoools or toilet seats, as it may be difficult to get up from low surfaces        Medication List  As of 08/06/2012 12:57 PM   STOP taking these medications         aspirin EC 81 MG tablet      cholecalciferol 1000 UNITS tablet      ibuprofen 200 MG tablet      vitamin C 1000 MG tablet      vitamin E 400 UNIT capsule         TAKE these medications         acetaminophen 500 MG tablet   Commonly known as: TYLENOL   Take 500 mg by mouth 3 (three) times daily.      atorvastatin 10 MG tablet   Commonly known as: LIPITOR   Take 10 mg by  mouth every evening.      iron polysaccharides 150 MG capsule   Commonly known as: NIFEREX   Take 1 capsule (150 mg total) by mouth 2 (two) times daily.      levothyroxine 88 MCG tablet   Commonly known as: SYNTHROID, LEVOTHROID   Take 88 mcg by mouth daily before breakfast.      lisinopril 10 MG tablet   Commonly known as: PRINIVIL,ZESTRIL   Take 10 mg by mouth every morning.      methocarbamol 500 MG tablet   Commonly known as: ROBAXIN   Take 1 tablet (500 mg total) by mouth every 6 (six) hours as needed.      metoprolol succinate 25 MG 24 hr tablet   Commonly known as: TOPROL-XL   Take 12.5 mg by mouth every morning.      oxyCODONE 5 MG immediate release tablet   Commonly known as: Oxy IR/ROXICODONE   Take 1-2 tablets (5-10 mg total) by mouth every 4 (four) hours as needed for pain.      ranitidine 75 MG tablet   Commonly known as: ZANTAC   Take 75 mg by mouth daily as needed. For heart burn      rivaroxaban 10 MG Tabs tablet   Commonly known as: XARELTO   Take 1 tablet (10 mg total) by mouth daily with breakfast. Take Xarelto for two and a half more weeks, then discontinue Xarelto.  Once the patient has completed the Xarelto, they may resume the 81 mg Aspirin.           Follow-up Information    Follow up with Loanne Drilling, MD. Schedule an appointment as soon as possible for a visit in 2 weeks.   Contact information:   [facility] [address], [address] [place] [address] [phone]          Signed: Patrica Duel 08/06/2012, 12:57 PM

## 2012-08-06 NOTE — Progress Notes (Signed)
Physical Therapy Treatment Patient Details Name: Yesenia Reynolds MRN: [id number] DOB: 20-Jan-1941 Today's Date: 08/06/2012 Time: 1533-1600 PT Time Calculation (min): 27 min  PT Assessment / Plan / Recommendation Comments on Treatment Session  Pt taking ltd pain meds (tylenol only this am)    Follow Up Recommendations  Home health PT    Barriers to Discharge        Equipment Recommendations  Rolling walker with 5" wheels    Recommendations for Other Services    Frequency 7X/week   Plan Discharge plan remains appropriate    Precautions / Restrictions Precautions Precautions: Knee Required Braces or Orthoses: Knee Immobilizer - Left Knee Immobilizer - Left: Discontinue once straight leg raise with < 10 degree lag Restrictions Weight Bearing Restrictions: No LLE Weight Bearing: Weight bearing as tolerated   Pertinent Vitals/Pain     Mobility  Bed Mobility Bed Mobility: Sit to Supine Sit to Supine: 4: Min assist Details for Bed Mobility Assistance: cues for sequence and min assist with L LE Transfers Transfers: Sit to Stand;Stand to Sit Sit to Stand: 4: Min assist Stand to Sit: 4: Min assist Details for Transfer Assistance: cues for hand placement and LE management. Needed 2 attempts to stand from elevated bed and increased time. Ambulation/Gait Ambulation/Gait Assistance: 4: Min assist Ambulation Distance (Feet): 121 Feet Assistive device: Rolling walker Ambulation/Gait Assistance Details: cues for posture, sequence, stride length, and position from RW Gait Pattern: Step-to pattern Gait velocity: slow    Exercises Total Joint Exercises Ankle Circles/Pumps: AROM;Both;Supine;20 reps Quad Sets: AROM;Both;Supine;20 reps Heel Slides: AAROM;20 reps;Supine;Left Straight Leg Raises: AAROM;Supine;Left;20 reps   PT Diagnosis:    PT Problem List:   PT Treatment Interventions:     PT Goals Acute Rehab PT Goals PT Goal Formulation: With patient Time For Goal  Achievement: 08/11/12 Potential to Achieve Goals: Good Pt will go Supine/Side to Sit: with supervision PT Goal: Supine/Side to Sit - Progress: Progressing toward goal Pt will go Sit to Supine/Side: with supervision PT Goal: Sit to Supine/Side - Progress: Progressing toward goal Pt will go Sit to Stand: with supervision PT Goal: Sit to Stand - Progress: Progressing toward goal Pt will go Stand to Sit: with supervision PT Goal: Stand to Sit - Progress: Progressing toward goal Pt will Ambulate: 51 - 150 feet;with supervision;with rolling walker PT Goal: Ambulate - Progress: Progressing toward goal  Visit Information  Last PT Received On: 08/06/12 Assistance Needed: +1    Subjective Data  Subjective: Min c/o dizziness Patient Stated Goal: Resume previous lifestyle with decreased pain   Cognition  Overall Cognitive Status: Appears within functional limits for tasks assessed/performed Arousal/Alertness: Awake/alert Orientation Level: Appears intact for tasks assessed Behavior During Session: [facility] for tasks performed    Balance     End of Session PT - End of Session Equipment Utilized During Treatment: Left knee immobilizer Activity Tolerance: Patient tolerated treatment well Patient left: in bed;with call bell/phone within reach;with family/visitor present Nurse Communication: Mobility status CPM Left Knee CPM Left Knee: On   GP     Roshawn Lacina 08/06/2012, 4:58 PM

## 2012-08-06 NOTE — Evaluation (Signed)
Occupational Therapy Evaluation Patient Details Name: Yesenia Reynolds MRN: [id number] DOB: 12/03/1941 Today's Date: 08/06/2012 Time: 4098-1191 OT Time Calculation (min): 41 min  OT Assessment / Plan / Recommendation Clinical Impression  Pt is s/p L TKA and is mosly limited by dizziness with getting OOB to chair and displays decreased strength and independence with ADL. Friends will be assisting pt at discharge and present for education today.     OT Assessment  Patient needs continued OT Services    Follow Up Recommendations  Home health OT;Supervision/Assistance - 24 hour    Barriers to Discharge      Equipment Recommendations  Rolling walker with 5" wheels    Recommendations for Other Services    Frequency  Min 2X/week    Precautions / Restrictions Precautions Precautions: Knee Required Braces or Orthoses: Knee Immobilizer - Left Knee Immobilizer - Left: Discontinue once straight leg raise with < 10 degree lag Restrictions Weight Bearing Restrictions: No LLE Weight Bearing: Weight bearing as tolerated        ADL  Eating/Feeding: Simulated;Independent Where Assessed - Eating/Feeding: Chair Grooming: Simulated;Wash/dry hands;Set up Where Assessed - Grooming: Supported sitting Upper Body Bathing: Simulated;Chest;Right arm;Left arm;Abdomen;Supervision/safety;Set up Where Assessed - Upper Body Bathing: Unsupported sitting Lower Body Bathing: Simulated;Moderate assistance Where Assessed - Lower Body Bathing: Supported sit to stand Upper Body Dressing: Simulated;Supervision/safety;Set up Where Assessed - Upper Body Dressing: Unsupported sitting Lower Body Dressing: Simulated;Maximal assistance Where Assessed - Lower Body Dressing: Supported sit to stand Toilet Transfer: Performed;Minimal assistance;Moderate assistance Toilet Transfer Method: Stand pivot Toilet Transfer Equipment: Bedside commode Toileting - Clothing Manipulation and Hygiene: Simulated;Minimal  assistance;Moderate assistance Where Assessed - Toileting Clothing Manipulation and Hygiene: Sit to stand from 3-in-1 or toilet Tub/Shower Transfer Method: Not assessed Equipment Used: Rolling walker ADL Comments: Friends can assist with LB ADL per pt report. She has a [place] and thinks it will fit in her bathroom to use at discharge. She would like to practice for discharge.     OT Diagnosis: Generalized weakness  OT Problem List: Decreased strength;Decreased knowledge of use of DME or AE;Decreased activity tolerance OT Treatment Interventions: Self-care/ADL training;Therapeutic activities;DME and/or AE instruction;Patient/family education   OT Goals Acute Rehab OT Goals OT Goal Formulation: With patient Time For Goal Achievement: 08/13/12 Potential to Achieve Goals: Good ADL Goals Pt Will Perform Grooming: with supervision;Standing at sink ADL Goal: Grooming - Progress: Goal set today Pt Will Transfer to Toilet: with supervision;Ambulation;with DME;3-in-1 ADL Goal: Toilet Transfer - Progress: Goal set today Pt Will Perform Toileting - Clothing Manipulation: with supervision;Standing ADL Goal: Toileting - Clothing Manipulation - Progress: Goal set today Pt Will Perform Toileting - Hygiene: with supervision;Sit to stand from 3-in-1/toilet ADL Goal: Toileting - Hygiene - Progress: Goal set today Pt Will Perform Tub/Shower Transfer: with min assist;Transfer tub bench ADL Goal: Tub/Shower Transfer - Progress: Goal set today  Visit Information  Last OT Received On: 08/06/12 Assistance Needed: +1    Subjective Data  Subjective: I was dizzy yesterday Patient Stated Goal: to get out of the bed   Prior Functioning  Vision/Perception  Home Living Lives With: Alone Available Help at Discharge: Friend(s) Type of Home: House Home Access: Stairs to enter [employer] of Steps: 1 Home Layout: One level Bathroom Shower/Tub: Engineer, manufacturing systems: Standard Home  Adaptive Equipment: Bedside commode/3-in-1;Shower chair with back;Tub transfer bench Additional Comments: has toilet riser with handles Prior Function Level of Independence: Independent with assistive device(s) Able to Take Stairs?: Yes Communication Communication: No difficulties  Cognition  Overall Cognitive Status: Appears within functional limits for tasks assessed/performed Arousal/Alertness: Awake/alert Orientation Level: Appears intact for tasks assessed Behavior During Session: [facility] for tasks performed    Extremity/Trunk Assessment Right Upper Extremity Assessment RUE ROM/Strength/Tone: [facility] for tasks assessed Left Upper Extremity Assessment LUE ROM/Strength/Tone: WFL for tasks assessed   Mobility  Shoulder Instructions  Bed Mobility Bed Mobility: Supine to Sit Supine to Sit: 4: Min assist;3: Mod assist;HOB elevated;With rails Details for Bed Mobility Assistance: cues for technique and hand placement to self assist, assist to support L LE and slight assist for trunk to upright. Transfers Transfers: Sit to Stand;Stand to Sit Sit to Stand: 4: Min assist;3: Mod assist;With upper extremity assist;From bed;From elevated surface Stand to Sit: 4: Min assist;With upper extremity assist;To chair/3-in-1 Details for Transfer Assistance: cues for hand placement and LE management. Needed 2 attempts to stand from elevated bed and increased time.       Exercise     Balance     End of Session OT - End of Session Equipment Utilized During Treatment: Gait belt Activity Tolerance: Other (comment) (dizziness) Patient left: in chair;with call bell/phone within reach;with family/visitor present CPM Left Knee CPM Left Knee: Off  GO     Lennox Laity [phone] 08/06/2012, 11:18 AM

## 2012-08-07 LAB — CBC
Hemoglobin: 8.3 g/dL — ABNORMAL LOW (ref 12.0–15.0)
MCH: 28.6 pg (ref 26.0–34.0)
MCHC: 32.9 g/dL (ref 30.0–36.0)
Platelets: 210 10*3/uL (ref 150–400)
RDW: 12.7 % (ref 11.5–15.5)

## 2012-08-07 NOTE — Progress Notes (Signed)
Occupational Therapy Treatment Patient Details Name: Yesenia Reynolds MRN: [id number] DOB: April 06, 1941 Today's Date: 08/07/2012 Time: 4098-1191 OT Time Calculation (min): 30 min  OT Assessment / Plan / Recommendation Comments on Treatment Session Pt and caregivers are knowledgeable in use of DME for ADL.  Pt has excellent support for home.    Follow Up Recommendations  Home health OT;Supervision/Assistance - 24 hour    Barriers to Discharge       Equipment Recommendations  Rolling walker with 5" wheels    Recommendations for Other Services    Frequency Min 2X/week   Plan Discharge plan remains appropriate    Precautions / Restrictions Precautions Precautions: Knee Required Braces or Orthoses: Knee Immobilizer - Left Knee Immobilizer - Left: Discontinue once straight leg raise with < 10 degree lag Restrictions Weight Bearing Restrictions: No LLE Weight Bearing: Weight bearing as tolerated   Pertinent Vitals/Pain     ADL  Grooming: Performed;Min guard Where Assessed - Grooming: Unsupported standing Toilet Transfer: Performed;Min guard Toilet Transfer Method: Surveyor, minerals: Raised toilet seat with arms (or 3-in-1 over toilet) Toileting - Clothing Manipulation and Hygiene: Performed;Supervision/safety Where Assessed - Engineer, mining and Hygiene: Sit to stand from 3-in-1 or toilet Tub/Shower Transfer: Performed;Min guard Tub/Shower Transfer Method: Science writer: Counsellor Used: Gait belt;Rolling walker Transfers/Ambulation Related to ADLs: min guard assist for ambulation in room and for practice with tub transfer. ADL Comments: Instructed pt and her caregivers in set up and use of tub transfer bench.      OT Diagnosis:    OT Problem List:   OT Treatment Interventions:     OT Goals ADL Goals Pt Will Perform Grooming: with supervision;Standing at sink ADL Goal: Grooming - Progress:  Progressing toward goals Pt Will Transfer to Toilet: with supervision;Ambulation;with DME;3-in-1 ADL Goal: Toilet Transfer - Progress: Progressing toward goals Pt Will Perform Toileting - Clothing Manipulation: with supervision;Standing ADL Goal: Toileting - Clothing Manipulation - Progress: Progressing toward goals Pt Will Perform Toileting - Hygiene: with supervision;Sit to stand from 3-in-1/toilet ADL Goal: Toileting - Hygiene - Progress: Met Pt Will Perform Tub/Shower Transfer: with min assist;Transfer tub bench ADL Goal: Tub/Shower Transfer - Progress: Met  Visit Information  Last OT Received On: 08/07/12 Assistance Needed: +1    Subjective Data      Prior Functioning       Cognition  Overall Cognitive Status: Appears within functional limits for tasks assessed/performed Arousal/Alertness: Awake/alert Orientation Level: Appears intact for tasks assessed Behavior During Session: [facility] for tasks performed    Mobility  Shoulder Instructions Bed Mobility Bed Mobility: Not assessed Supine to Sit: 4: Min assist;HOB elevated Sitting - Scoot to Edge of Bed: 4: Min guard Details for Bed Mobility Assistance: Cues for technique and hand placement to self assist trunk with physical assist required for LLE.  Transfers Transfers: Sit to Stand;Stand to Sit Sit to Stand: 4: Min guard;From chair/3-in-1 Stand to Sit: 4: Min guard;With upper extremity assist Details for Transfer Assistance: Assist to rise and steady with cues for hand placement and LE management.  Family very involved, therefore educated on how to assist pt.        Exercises    Balance     End of Session OT - End of Session Activity Tolerance: Patient tolerated treatment well Patient left: in chair;with call bell/phone within reach;with family/visitor present  GO     Evern Bio 08/07/2012, 12:53 PM [phone]

## 2012-08-07 NOTE — Progress Notes (Signed)
Received call from Liborio Nixon who accompanied [facility] home. Xarelto was not approved through [employer]. [facility] wanted this approved. RNCM on Friday had arranged HH, equip and had failed to provide pt with $10.00 co-pay card. Called [place], [place] who approved use of Enteric coated ASA 325mg  po Bid x 3 weeks or until return ov to [place]. Called and left message for both RNCM and Liborio Nixon.

## 2012-08-07 NOTE — Progress Notes (Signed)
Physical Therapy Treatment Patient Details Name: Yesenia Reynolds MRN: [id number] DOB: September 02, 1941 Today's Date: 08/07/2012 Time: 4098-1191 PT Time Calculation (min): 40 min  PT Assessment / Plan / Recommendation Comments on Treatment Session  Pt continues to have some slight c/o dizziness/nausea during ambulation, better with rest.  Educated family on how to assist pt and how to don brace.  Ready for D/C.     Follow Up Recommendations  Home health PT    Barriers to Discharge        Equipment Recommendations  Rolling walker with 5" wheels    Recommendations for Other Services    Frequency 7X/week   Plan Discharge plan remains appropriate    Precautions / Restrictions Precautions Precautions: Knee Required Braces or Orthoses: Knee Immobilizer - Left Knee Immobilizer - Left: Discontinue once straight leg raise with < 10 degree lag Restrictions Weight Bearing Restrictions: No LLE Weight Bearing: Weight bearing as tolerated   Pertinent Vitals/Pain 6/10, applied ice pack, repositioned    Mobility  Bed Mobility Bed Mobility: Supine to Sit;Sitting - Scoot to Edge of Bed Supine to Sit: 4: Min assist;HOB elevated Sitting - Scoot to [place] of Bed: 4: Min guard Details for Bed Mobility Assistance: Cues for technique and hand placement to self assist trunk with physical assist required for LLE.  Transfers Transfers: Sit to Stand;Stand to Sit Sit to Stand: 4: Min assist Stand to Sit: 4: Min assist Details for Transfer Assistance: Assist to rise and steady with cues for hand placement and LE management.  Family very involved, therefore educated on how to assist pt.  Ambulation/Gait Ambulation/Gait Assistance: 4: Min guard Ambulation Distance (Feet): 100 Feet Assistive device: Rolling walker Ambulation/Gait Assistance Details: Min cues for upright posture, sequencing and technique.  Gait Pattern: Step-to pattern Gait velocity: slow    Exercises Total Joint Exercises Ankle  Circles/Pumps: AROM;Both;20 reps Quad Sets: AROM;Both;10 reps Heel Slides: AAROM;Left;10 reps Hip ABduction/ADduction: AAROM;Left;10 reps Straight Leg Raises: AAROM;Left;10 reps   PT Diagnosis:    PT Problem List:   PT Treatment Interventions:     PT Goals Acute Rehab PT Goals PT Goal Formulation: With patient Time For Goal Achievement: 08/11/12 Potential to Achieve Goals: Good Pt will go Supine/Side to Sit: with supervision PT Goal: Supine/Side to Sit - Progress: Progressing toward goal Pt will go Sit to Stand: with supervision PT Goal: Sit to Stand - Progress: Progressing toward goal Pt will go Stand to Sit: with supervision PT Goal: Stand to Sit - Progress: Progressing toward goal Pt will Ambulate: 51 - 150 feet;with supervision;with rolling walker PT Goal: Ambulate - Progress: Progressing toward goal PT Goal: Up/Down Stairs - Progress: Discontinued (comment) (Pt states she does not have stairs)  Visit Information  Last PT Received On: 08/07/12 Assistance Needed: +1    Subjective Data  Subjective: My back is hurting more than my knee Patient Stated Goal: Resume previous lifestyle with decreased pain   Cognition  Overall Cognitive Status: Appears within functional limits for tasks assessed/performed Arousal/Alertness: Awake/alert Orientation Level: Appears intact for tasks assessed Behavior During Session: [facility] for tasks performed    Balance     End of Session PT - End of Session Equipment Utilized During Treatment: Left knee immobilizer Activity Tolerance: Patient limited by pain;Patient limited by fatigue Patient left: in chair;with call bell/phone within reach;with family/visitor present Nurse Communication: Mobility status CPM Left Knee CPM Left Knee: Off   GP     Page, Meribeth Mattes 08/07/2012, 9:25 AM

## 2012-08-07 NOTE — Progress Notes (Signed)
Discharged to family auto via w/c. Assessment unchanged from am. 

## 2012-08-07 NOTE — Progress Notes (Addendum)
   Subjective: 3 Days Post-Op Procedure(s) (LRB): TOTAL KNEE ARTHROPLASTY (Left)   Patient reports pain as mild, pain well controlled. No events throughout the night. Ready to be discharged to home.  Objective:   VITALS:   Filed Vitals:   08/07/12 0505  BP: 127/72  Pulse: 95  Temp: 98.6 F (37 C)  Resp: 16    Neurovascular intact Dorsiflexion/Plantar flexion intact Incision: dressing C/D/I No cellulitis present Compartment soft  LABS  Basename 08/07/12 0545 08/06/12 1400 08/06/12 0419  HGB 8.3* 8.6* 8.6*  HCT 25.2* 26.1* 25.9*  WBC 8.7 8.4 8.8  PLT 210 175 189     Basename 08/06/12 0419 08/05/12 0418  NA 136 137  K 4.3 4.1  BUN 14 11  CREATININE 0.74 0.71  GLUCOSE 140* 162*     Assessment/Plan: 3 Days Post-Op Procedure(s) (LRB): TOTAL KNEE ARTHROPLASTY (Left)  Dressings changes with 4x4 guaze and tape Up with therapy Discharge home with home health Follow up in 2 weeks at [facility].  Follow-up Information    Follow up with Dr. Lequita Halt in 2 weeks.   Contact information:   [facility] [address] [place] [address] [phone]         Anastasio Auerbach. Kayti Poss   PAC  08/07/2012, 9:18 AM

## 2012-09-06 ENCOUNTER — Ambulatory Visit: Payer: Medicare Other | Admitting: Cardiology

## 2012-09-16 ENCOUNTER — Encounter: Payer: Self-pay | Admitting: Cardiology

## 2012-09-16 ENCOUNTER — Ambulatory Visit (INDEPENDENT_AMBULATORY_CARE_PROVIDER_SITE_OTHER): Payer: Medicare Other | Admitting: Cardiology

## 2012-09-16 VITALS — BP 137/73 | HR 82 | Ht 61.0 in | Wt 175.0 lb

## 2012-09-16 DIAGNOSIS — I10 Essential (primary) hypertension: Secondary | ICD-10-CM

## 2012-09-16 DIAGNOSIS — R002 Palpitations: Secondary | ICD-10-CM

## 2012-09-16 MED ORDER — METOPROLOL SUCCINATE ER 25 MG PO TB24: 12.5000 mg | ORAL_TABLET | Freq: Every morning | ORAL | Status: DC

## 2012-09-16 NOTE — Patient Instructions (Addendum)

## 2012-09-16 NOTE — Assessment & Plan Note (Signed)
Improved on low-dose beta blocker. We will continue Toprol-XL.

## 2012-09-16 NOTE — Assessment & Plan Note (Signed)
No change to current regimen. Continue followup with Dr. Sherril Croon.

## 2012-09-16 NOTE — Progress Notes (Signed)
   Clinical Summary Yesenia Reynolds is a 71 y.o.female presenting for followup. She was seen in July for preoperative evaluation prior to left knee surgery. She did undergo a left knee replacement, no cardiac complications. She has done well, nearing completion of PT. She actually states that she has had fewer palpitations since being on low-dose beta blocker, and we have decided to continue her Toprol-XL.   Allergies  Allergen Reactions  . Morphine And Related Other (See Comments)    Hypotension    Current Outpatient Prescriptions  Medication Sig Dispense Refill  . acetaminophen (TYLENOL) 500 MG tablet Take 500 mg by mouth 3 (three) times daily.      Marland Kitchen atorvastatin (LIPITOR) 10 MG tablet Take 10 mg by mouth every evening.       . iron polysaccharides (NIFEREX) 150 MG capsule Take 1 capsule (150 mg total) by mouth 2 (two) times daily.  42 capsule  0  . levothyroxine (SYNTHROID, LEVOTHROID) 88 MCG tablet Take 88 mcg by mouth daily before breakfast.       . lisinopril (PRINIVIL,ZESTRIL) 10 MG tablet Take 10 mg by mouth every morning.       . methocarbamol (ROBAXIN) 500 MG tablet Take 1 tablet by mouth Twice daily as needed.      . metoprolol succinate (TOPROL-XL) 25 MG 24 hr tablet Take 0.5 tablets (12.5 mg total) by mouth every morning.  45 tablet  3  . ranitidine (ZANTAC) 75 MG tablet Take 75 mg by mouth daily as needed. For heart burn      . timolol (TIMOPTIC) 0.5 % ophthalmic solution Place 1 drop into the right eye daily.      Marland Kitchen DISCONTD: metoprolol succinate (TOPROL-XL) 25 MG 24 hr tablet Take 12.5 mg by mouth every morning.        Past Medical History  Diagnosis Date  . Mixed hyperlipidemia   . Hypothyroidism   . Essential hypertension, benign   . Arthritis   . Carotid artery disease     Less than 50% ICA stenoses 6/13  . Type 2 diabetes mellitus     diet controlled   . GERD (gastroesophageal reflux disease)   . Spinal stenosis     Social History Yesenia Reynolds reports that she  has never smoked. She has never used smokeless tobacco. Yesenia Reynolds reports that she does not drink alcohol.  Review of Systems No angina. Improving range of motion in her left knee with PT. No falls. Otherwise negative.  Physical Examination Filed Vitals:   09/16/12 1354  BP: 137/73  Pulse: 82   Filed Weights   09/16/12 1354  Weight: 175 lb (79.379 kg)    HEENT: Conjunctiva and lids normal, oropharynx clear.  Neck: Supple, no elevated JVP or carotid bruits, no thyromegaly.  Lungs: Clear to auscultation, nonlabored breathing at rest.  Cardiac: Regular rate and rhythm, no S3 with soft systolic murmur, no pericardial rub.  Abdomen: Soft, nontender, bowel sounds present, no guarding or rebound.  Extremities: No pitting edema, distal pulses 2+.     Problem List and Plan   Palpitations Improved on low-dose beta blocker. We will continue Toprol-XL.  Essential hypertension, benign No change to current regimen. Continue followup with Dr. Sherril Croon.    Jonelle Sidle, M.D., F.A.C.C.

## 2013-10-21 ENCOUNTER — Other Ambulatory Visit: Payer: Self-pay | Admitting: Cardiology

## 2013-10-21 MED ORDER — METOPROLOL SUCCINATE ER 25 MG PO TB24: 12.5000 mg | ORAL_TABLET | Freq: Every morning | ORAL | Status: DC

## 2014-01-23 ENCOUNTER — Other Ambulatory Visit: Payer: Self-pay | Admitting: Cardiology

## 2014-01-23 MED ORDER — METOPROLOL SUCCINATE ER 25 MG PO TB24: 12.5000 mg | ORAL_TABLET | Freq: Every morning | ORAL | Status: DC

## 2014-03-14 ENCOUNTER — Other Ambulatory Visit: Payer: Self-pay | Admitting: *Deleted

## 2014-03-14 MED ORDER — METOPROLOL SUCCINATE ER 25 MG PO TB24: 12.5000 mg | ORAL_TABLET | Freq: Every morning | ORAL | Status: DC

## 2014-04-04 ENCOUNTER — Encounter: Payer: Self-pay | Admitting: Cardiology

## 2014-04-04 ENCOUNTER — Ambulatory Visit (INDEPENDENT_AMBULATORY_CARE_PROVIDER_SITE_OTHER): Payer: Medicare Other | Admitting: Cardiology

## 2014-04-04 VITALS — BP 176/84 | HR 74 | Ht 61.0 in | Wt 195.0 lb

## 2014-04-04 DIAGNOSIS — I779 Disorder of arteries and arterioles, unspecified: Secondary | ICD-10-CM

## 2014-04-04 DIAGNOSIS — R002 Palpitations: Secondary | ICD-10-CM

## 2014-04-04 DIAGNOSIS — I10 Essential (primary) hypertension: Secondary | ICD-10-CM

## 2014-04-04 DIAGNOSIS — I739 Peripheral vascular disease, unspecified: Secondary | ICD-10-CM

## 2014-04-04 NOTE — Assessment & Plan Note (Signed)
Stable on low-dose Toprol-XL. No changes made today. She can always increase dose from 12.5-25 mg if her symptoms become more frequent or intense.

## 2014-04-04 NOTE — Assessment & Plan Note (Signed)
Blood pressure elevated today. She reports compliance with her medications. Recommended that she keep followup with Vyas for possible medication adjustments.

## 2014-04-04 NOTE — Patient Instructions (Signed)
Continue all current medications. Your physician wants you to follow up in:  1 year.  You will receive a reminder letter in the mail one-two months in advance.  If you don't receive a letter, please call our office to schedule the follow up appointment   

## 2014-04-04 NOTE — Progress Notes (Signed)
Clinical Summary Ms. Yesenia Reynolds is a 73 y.o.female last seen in October 2013. She is retired, still does some volunteering at the [employer]. Reports remaining active in her ADLs, limited somewhat by back pain, but walks for exercise. She is not reporting any significant chest pain or shortness of breath. Has only occasional palpitations, no dizziness or syncope. She continues to tolerate low-dose Toprol-XL.   Allergies  Allergen Reactions  . Morphine And Related Other (See Comments)    Hypotension    Current Outpatient Prescriptions  Medication Sig Dispense Refill  . acetaminophen (TYLENOL) 500 MG tablet Take 500 mg by mouth 3 (three) times daily.      . Ascorbic Acid (VITAMIN C) 1000 MG tablet Take 1,000 mg by mouth daily.      Marland Kitchen aspirin 81 MG tablet Take 81 mg by mouth daily.      Marland Kitchen atorvastatin (LIPITOR) 10 MG tablet Take 10 mg by mouth every evening.       . cholecalciferol (VITAMIN D) 1000 UNITS tablet Take 2,000 Units by mouth daily.      Marland Kitchen ibuprofen (ADVIL,MOTRIN) 200 MG tablet Take 200 mg by mouth every 6 (six) hours as needed.      Marland Kitchen levothyroxine (SYNTHROID, LEVOTHROID) 88 MCG tablet Take 88 mcg by mouth daily before breakfast.       . lisinopril (PRINIVIL,ZESTRIL) 10 MG tablet Take 10 mg by mouth every morning.       . metoprolol succinate (TOPROL-XL) 25 MG 24 hr tablet Take 0.5 tablets (12.5 mg total) by mouth every morning.  45 tablet  0  . ranitidine (ZANTAC) 75 MG tablet Take 75 mg by mouth daily as needed. For heart burn      . vitamin E 400 UNIT capsule Take 400 Units by mouth daily.       No current facility-administered medications for this visit.    Past Medical History  Diagnosis Date  . Mixed hyperlipidemia   . Hypothyroidism   . Essential hypertension, benign   . Arthritis   . Carotid artery disease     Less than 50% ICA stenoses 6/13  . Type 2 diabetes mellitus     diet controlled   . GERD (gastroesophageal reflux disease)   . Spinal  stenosis     Social History Ms. Garlington reports that she has never smoked. She has never used smokeless tobacco. Ms. Heideman reports that she does not drink alcohol.  Review of Systems No claudication. No orthopnea or PND. Stable appetite. Otherwise negative except as outlined.  Physical Examination Filed Vitals:   04/04/14 0939  BP: 176/84  Pulse: 74   Filed Weights   04/04/14 0939  Weight: 195 lb (88.451 kg)    Overweight woman in no acute distress.  HEENT: Conjunctiva and lids normal, oropharynx clear.  Neck: Supple, no elevated JVP or carotid bruits, no thyromegaly.  Lungs: Clear to auscultation, nonlabored breathing at rest.  Cardiac: Regular rate and rhythm, no S3 with soft systolic murmur, no pericardial rub.  Abdomen: Soft, nontender, bowel sounds present, no guarding or rebound.  Extremities: No pitting edema, distal pulses 2+.  Skin: Warm and dry.  Musculoskeletal: No kyphosis.  Neuropsychiatric: Alert and oriented x3, affect grossly appropriate.   Problem List and Plan   Palpitations Stable on low-dose Toprol-XL. No changes made today. She can always increase dose from 12.5-25 mg if her symptoms become more frequent or intense.  Essential hypertension, benign Blood pressure elevated today. She reports compliance  with her medications. Recommended that she keep followup with Vyas for possible medication adjustments.  Carotid artery disease History of less than 50% ICA stenoses as of 2013. Keep follow with Dr. Woody Seller. She remains on aspirin and statin.    Satira Sark, M.D., F.A.C.C.

## 2014-04-04 NOTE — Assessment & Plan Note (Signed)
History of less than 50% ICA stenoses as of 2013. Keep follow with Dr. Woody Seller. She remains on aspirin and statin.

## 2014-06-19 ENCOUNTER — Other Ambulatory Visit: Payer: Self-pay | Admitting: *Deleted

## 2014-06-19 MED ORDER — METOPROLOL SUCCINATE ER 25 MG PO TB24: 12.5000 mg | ORAL_TABLET | Freq: Every morning | ORAL | Status: DC

## 2015-03-13 ENCOUNTER — Encounter: Payer: Self-pay | Admitting: Cardiology

## 2015-03-13 ENCOUNTER — Ambulatory Visit (INDEPENDENT_AMBULATORY_CARE_PROVIDER_SITE_OTHER): Payer: Medicare Other | Admitting: Cardiology

## 2015-03-13 VITALS — BP 158/72 | HR 84 | Ht 61.0 in | Wt 186.0 lb

## 2015-03-13 DIAGNOSIS — R002 Palpitations: Secondary | ICD-10-CM | POA: Diagnosis not present

## 2015-03-13 DIAGNOSIS — I10 Essential (primary) hypertension: Secondary | ICD-10-CM

## 2015-03-13 NOTE — Progress Notes (Signed)
Cardiology Office Note  Date: 03/13/2015   ID: STEVEN VEAZIE, DOB 1941/02/08, MRN [id number]  PCP: Glenda Chroman., MD  Primary Cardiologist: Rozann Lesches, MD   Chief Complaint  Patient presents with  . History of palpitations    History of Present Illness: Yesenia Reynolds is a 74 y.o. female last seen in April 2015. She presents for a routine follow-up visit today. We have been following her with a history of intermittent palpitations, well controlled on low-dose beta blocker. She does not report any worsening symptoms, no exertional chest pain or unusual shortness of breath.  Interval history includes diagnosis of grade 2 endometrial adenocarcinoma, she is under the care of Dr. Clenton Pare in [place] and will be undergoing robotic surgery later this month. She also anticipates that she may need radiation treatments. Overall, seems to have a positive outlook on things.  ECG today is normal, reviewed below.   Past Medical History  Diagnosis Date  . Mixed hyperlipidemia   . Hypothyroidism   . Essential hypertension, benign   . Arthritis   . Carotid artery disease     Less than 50% ICA stenoses 6/13  . Type 2 diabetes mellitus   . GERD (gastroesophageal reflux disease)   . Spinal stenosis   . Palpitations     Current Outpatient Prescriptions  Medication Sig Dispense Refill  . acetaminophen (TYLENOL) 500 MG tablet Take 500 mg by mouth 3 (three) times daily.    . Ascorbic Acid (VITAMIN C) 1000 MG tablet Take 1,000 mg by mouth daily.    Marland Kitchen aspirin 81 MG tablet Take 81 mg by mouth daily.    Marland Kitchen atorvastatin (LIPITOR) 10 MG tablet Take 10 mg by mouth every evening.     . cholecalciferol (VITAMIN D) 1000 UNITS tablet Take 2,000 Units by mouth daily.    Marland Kitchen ibuprofen (ADVIL,MOTRIN) 200 MG tablet Take 200 mg by mouth every 6 (six) hours as needed.    Marland Kitchen levothyroxine (SYNTHROID, LEVOTHROID) 88 MCG tablet Take 88 mcg by mouth daily before breakfast.     . lisinopril  (PRINIVIL,ZESTRIL) 10 MG tablet Take 10 mg by mouth every morning.     . metoprolol succinate (TOPROL-XL) 25 MG 24 hr tablet Take 0.5 tablets (12.5 mg total) by mouth every morning. 45 tablet 3  . ranitidine (ZANTAC) 75 MG tablet Take 75 mg by mouth daily as needed. For heart burn    . vitamin E 400 UNIT capsule Take 400 Units by mouth daily.     No current facility-administered medications for this visit.    Allergies:  Morphine and related   Social History: The patient  reports that she has never smoked. She has never used smokeless tobacco. She reports that she does not drink alcohol or use illicit drugs.   ROS:  Please see the history of present illness. Otherwise, complete review of systems is positive for vaginal spotting.  All other systems are reviewed and negative.   Physical Exam: VS:  BP 158/72 mmHg  Pulse 84  Ht 5\' 1"  (1.549 m)  Wt 186 lb (84.369 kg)  BMI 35.16 kg/m2  SpO2 97%, BMI Body mass index is 35.16 kg/(m^2).  Wt Readings from Last 3 Encounters:  03/13/15 186 lb (84.369 kg)  04/04/14 195 lb (88.451 kg)  09/16/12 175 lb (79.379 kg)     Overweight woman in no acute distress.  HEENT: Conjunctiva and lids normal, oropharynx clear.  Neck: Supple, no elevated JVP or carotid bruits, no thyromegaly.  Lungs: Clear to auscultation, nonlabored breathing at rest.  Cardiac: Regular rate and rhythm, no S3 with soft systolic murmur, no pericardial rub.  Abdomen: Soft, nontender, bowel sounds present, no guarding or rebound.  Extremities: No pitting edema, distal pulses 2+.    ECG: ECG is ordered today and reviewed showing normal sinus rhythm.   Other Studies Reviewed Today:  Echocardiogram 05/17/2012 [facility]) reported LVEF 03-01%, grade 1 diastolic dysfunction, thickened mitral leaflets with mild mitral regurgitation, mild to moderate aortic regurgitation, minimal pericardial effusion versus epicardial fat pad.  Assessment and Plan:  1. History  of palpitations, well controlled on low-dose beta blocker. ECG today is normal. Continue observation.  2. Essential hypertension, blood pressure is elevated today. She reports compliance with her medications. Keep follow-up with Dr. Woody Seller.  3. Interval diagnosis of grade 2 endometrial adenocarcinoma, patient undergoing robotic surgery later this month and [place]. I do not anticipate need for any further cardiac testing prior to this.   Current medicines were reviewed with the patient today. No changes were made.   Orders Placed This Encounter  Procedures  . EKG 12-Lead    Disposition: FU with me in 1 year.   Signed, Satira Sark, MD, [facility] 03/13/2015 10:43 AM    [facility] [place], [place], [place] [address] Phone: [phone]; Fax: [phone]

## 2015-03-13 NOTE — Patient Instructions (Signed)

## 2015-04-23 DIAGNOSIS — K5904 Chronic idiopathic constipation: Secondary | ICD-10-CM | POA: Insufficient documentation

## 2015-04-23 DIAGNOSIS — M5431 Sciatica, right side: Secondary | ICD-10-CM | POA: Insufficient documentation

## 2015-04-23 DIAGNOSIS — M5432 Sciatica, left side: Secondary | ICD-10-CM

## 2015-05-02 ENCOUNTER — Other Ambulatory Visit: Payer: Self-pay | Admitting: Neurosurgery

## 2015-05-02 DIAGNOSIS — M47816 Spondylosis without myelopathy or radiculopathy, lumbar region: Secondary | ICD-10-CM

## 2015-05-14 DIAGNOSIS — M7989 Other specified soft tissue disorders: Secondary | ICD-10-CM | POA: Insufficient documentation

## 2015-05-25 ENCOUNTER — Other Ambulatory Visit: Payer: Medicare Other

## 2015-06-08 ENCOUNTER — Other Ambulatory Visit: Payer: Self-pay | Admitting: *Deleted

## 2015-06-08 MED ORDER — METOPROLOL SUCCINATE ER 25 MG PO TB24: 12.5000 mg | ORAL_TABLET | Freq: Every morning | ORAL | Status: DC

## 2015-06-15 ENCOUNTER — Ambulatory Visit
Admission: RE | Admit: 2015-06-15 | Discharge: 2015-06-15 | Disposition: A | Discharge status: Home / Self Care | Payer: Medicare Other | Source: Ambulatory Visit | Attending: Neurosurgery | Admitting: Neurosurgery

## 2015-06-15 ENCOUNTER — Other Ambulatory Visit: Payer: Self-pay | Admitting: Neurosurgery

## 2015-06-15 DIAGNOSIS — M47816 Spondylosis without myelopathy or radiculopathy, lumbar region: Secondary | ICD-10-CM

## 2015-06-18 DIAGNOSIS — F40240 Claustrophobia: Secondary | ICD-10-CM | POA: Insufficient documentation

## 2015-07-30 ENCOUNTER — Telehealth: Payer: Self-pay | Admitting: *Deleted

## 2015-07-30 NOTE — Telephone Encounter (Signed)
CALLED PATIENT TO INFORM OF HDR VAG. CUFF CASE, LVM FOR A RETURN CALL

## 2015-08-07 ENCOUNTER — Encounter: Payer: Self-pay | Admitting: Radiation Oncology

## 2015-08-07 ENCOUNTER — Ambulatory Visit: Payer: Self-pay | Admitting: Radiation Oncology

## 2015-08-07 ENCOUNTER — Other Ambulatory Visit: Payer: Self-pay

## 2015-08-07 ENCOUNTER — Telehealth: Payer: Self-pay | Admitting: *Deleted

## 2015-08-07 NOTE — Telephone Encounter (Signed)
CALLED PATIENT TO REMIND OF HDR VAG. CUFF CASE FOR 08-08-15, CONFIRMED APPTS. WITH PATIENT

## 2015-08-08 ENCOUNTER — Ambulatory Visit
Admission: RE | Admit: 2015-08-08 | Discharge: 2015-08-08 | Disposition: A | Discharge status: Home / Self Care | Payer: Medicare Other | Source: Ambulatory Visit | Attending: Radiation Oncology | Admitting: Radiation Oncology

## 2015-08-08 ENCOUNTER — Encounter: Payer: Self-pay | Admitting: Radiation Oncology

## 2015-08-08 VITALS — BP 162/64 | HR 100 | Temp 98.8°F | Resp 20 | Ht 62.0 in | Wt 168.3 lb

## 2015-08-08 DIAGNOSIS — Z510 Encounter for antineoplastic radiation therapy: Secondary | ICD-10-CM | POA: Insufficient documentation

## 2015-08-08 DIAGNOSIS — Z90722 Acquired absence of ovaries, bilateral: Secondary | ICD-10-CM | POA: Diagnosis not present

## 2015-08-08 DIAGNOSIS — C541 Malignant neoplasm of endometrium: Secondary | ICD-10-CM

## 2015-08-08 DIAGNOSIS — Z79899 Other long term (current) drug therapy: Secondary | ICD-10-CM | POA: Insufficient documentation

## 2015-08-08 DIAGNOSIS — Z885 Allergy status to narcotic agent status: Secondary | ICD-10-CM | POA: Diagnosis not present

## 2015-08-08 DIAGNOSIS — Z90710 Acquired absence of both cervix and uterus: Secondary | ICD-10-CM | POA: Insufficient documentation

## 2015-08-08 DIAGNOSIS — Z9221 Personal history of antineoplastic chemotherapy: Secondary | ICD-10-CM | POA: Insufficient documentation

## 2015-08-08 DIAGNOSIS — Z7982 Long term (current) use of aspirin: Secondary | ICD-10-CM | POA: Diagnosis not present

## 2015-08-08 HISTORY — DX: Malignant neoplasm of endometrium: C54.1

## 2015-08-08 NOTE — Progress Notes (Signed)
  Radiation Oncology         [phone]) [phone] ________________________________  Name: Yesenia Reynolds MRN: [id number]  Date: 08/08/2015  DOB: Aug 09, 1941   HDR BRACHYTHERAPY  DIAGNOSIS:     ICD-9-CM ICD-10-CM   1. Endometrial cancer, FIGO stage IIIC 182.0 C54.1      NARRATIVE:  The patient was brought to the HDR suite.  Identity was confirmed.  All relevant records and images related to the planned course of therapy were reviewed.  The patient freely provided informed written consent to proceed with treatment after reviewing the details related to the planned course of therapy. The consent form was witnessed and verified by the simulation staff.  Then, the patient was set-up in a stable reproducible  supine position for radiation therapy. The patient's custom vaginal cylinder was placed in the proximal vagina. This was affixed to the CT/MR stabilization plate to prevent slippage.  Verification simulation note:  An AP and lateral film was obtained with the vaginal cylinder and fiducial markers in place. This was compared to the patient's planning films showing accurate position of the vaginal cylinder for treatment.   High-dose-rate brachytherapy treatment:  The remote afterloading device was affixed to the vaginal cylinder by catheter system. Patient then proceeded to undergo her first high-dose-rate treatment directed at the proximal vagina. Patient was prescribed a dose of 6 gray to be delivered to the vaginal mucosal surface. This was achieved with a total dwell time of 199.6 seconds. The patient was treated with 1 channel using 7 dwell positions. Patient tolerated the procedure well except for pain in her back and pelvis from lying on the treatment table  (chronic problem for her). After treatment was complete a radiation survey was performed documenting return of the iridium source into the gamma med safe.  Plan: Patient will return next week for her second high-dose-rate brachii therapy  treatment using iridium 192 as the high-dose-rate source.   ________________________________  Blair Promise, PhD, MD

## 2015-08-08 NOTE — Progress Notes (Addendum)
  Radiation Oncology         (336) [phone] ________________________________  Name: Yesenia Reynolds MRN: [id number]  Date: 08/08/2015  DOB: 1941/01/18  SIMULATION AND TREATMENT PLANNING NOTE HDR BRACHYTHERAPY  DIAGNOSIS:     ICD-9-CM ICD-10-CM   1. Endometrial cancer, FIGO stage IIIC 182.0 C54.1     NARRATIVE:  The patient was brought to the [place].  Identity was confirmed.  All relevant records and images related to the planned course of therapy were reviewed.  The patient freely provided informed written consent to proceed with treatment after reviewing the details related to the planned course of therapy. The consent form was witnessed and verified by the simulation staff.  Then, the patient was set-up in a stable reproducible  supine position for radiation therapy.  CT images were obtained.  Surface markings were placed.  The CT images were loaded into the planning software.  Then the target and avoidance structures were contoured.  Treatment planning then occurred.  The radiation prescription was entered and confirmed.   I have requested : Brachytherapy Isodose Plan and Dosimetry Calculations to plan the radiation distribution.    PLAN:  The patient will receive 18 Gy in 3 fractions.  She will be treated with a 2.5 cm diameter cylinder, segmented. Prescription will be 6 gray to the mucosal surface. Treatment length will be 3 cm    ________________________________  Blair Promise, PhD, MD

## 2015-08-08 NOTE — Progress Notes (Signed)
Radiation Oncology         [phone] ________________________________  Name: Yesenia Reynolds MRN: [id number]  Date: 08/08/2015  DOB: 1940-12-15  Vaginal brachii therapy procedure Note  CC: VYAS,DHRUV B., MD  Glenda Chroman., MD    ICD-9-CM ICD-10-CM   1. Endometrial cancer 182.0 C54.1   2. Endometrial cancer, FIGO stage IIIC 182.0 C54.1    Diagnosis: Clinical Stage III Grade 2 Endometrial Cancer  Interval Since Last Radiation:  06/27/15-07/31/15  treatments of the [facility] in [place] [facility]/Dose: 45 Gy to the Pelvic Area  Narrative:  The patient returns today for further evaluation. The pt completed external radiation on 07/31/15 at [facility] in [place], [place]. She is here to begin intracavitary brachytherapy treatments at [facility]. The pt underwent a robotic hysterectomy with bilateral salpingo-oophorectomy and sentinel node procedure by Dr. Clenton Pare at [place] on 03/30/15. She reports 1 vaginal bleed 3-4 days ago and a weight loss of 20 lbs since her hysterectomy. She reports urinary frequency and urgency, had a UTI, and completed Cipro about 2 weeks ago and has taken AZO for 2 days. She takes Miralax as needed and has bowel movements every 3-4 days. She also reports nausea and a poor appetite. She saw Dr. Tressie Stalker on 08/01/15.   04/26/2015 - Chemotherapy  Carbo/Taxol initiated for 3 cycles to be followed by radiation therapy then 3 further cycles of carbo/Taxol    ALLERGIES:  is allergic to morphine and related.  Meds: Current Outpatient Prescriptions  Medication Sig Dispense Refill  . acetaminophen (TYLENOL) 500 MG tablet Take 500 mg by mouth 3 (three) times daily.    Marland Kitchen atorvastatin (LIPITOR) 10 MG tablet Take 10 mg by mouth every evening.     . diclofenac sodium (VOLTAREN) 1 % GEL Place 1 application onto the skin as needed.    . docusate sodium (COLACE) 100 MG capsule Take 100 mg by mouth as needed.    . fentaNYL (DURAGESIC - DOSED MCG/HR) 75  MCG/HR Place 75 mcg onto the skin every 3 (three) days.    Marland Kitchen ibuprofen (ADVIL,MOTRIN) 200 MG tablet Take 200 mg by mouth every 6 (six) hours as needed.    . latanoprost (XALATAN) 0.005 % ophthalmic solution Place 1 drop into both eyes at bedtime.    Marland Kitchen levothyroxine (SYNTHROID, LEVOTHROID) 88 MCG tablet Take 88 mcg by mouth daily before breakfast.     . lidocaine-prilocaine (EMLA) cream Apply 1 application topically as needed. With chemo    . lisinopril (PRINIVIL,ZESTRIL) 10 MG tablet Take 10 mg by mouth every morning.     Marland Kitchen LORazepam (ATIVAN) 0.5 MG tablet Take 0.5 mg by mouth daily as needed.    . metoprolol succinate (TOPROL-XL) 25 MG 24 hr tablet Take 0.5 tablets (12.5 mg total) by mouth every morning. 45 tablet 3  . ondansetron (ZOFRAN) 8 MG tablet Take 8 mg by mouth as needed.    Marland Kitchen oxyCODONE-acetaminophen (PERCOCET/ROXICET) 5-325 MG per tablet Take 1 tablet by mouth every 8 (eight) hours as needed for severe pain.    . polyethylene glycol (MIRALAX / GLYCOLAX) packet Take 17 g by mouth daily.    . prochlorperazine (COMPAZINE) 10 MG tablet Take 10 mg by mouth as needed.    . ranitidine (ZANTAC) 75 MG tablet Take 75 mg by mouth daily as needed. For heart burn    . Ascorbic Acid (VITAMIN C) 1000 MG tablet Take 1,000 mg by mouth daily.    Marland Kitchen aspirin 81 MG  tablet Take 81 mg by mouth daily.    . cholecalciferol (VITAMIN D) 1000 UNITS tablet Take 2,000 Units by mouth daily.    . vitamin E 400 UNIT capsule Take 400 Units by mouth daily.     No current facility-administered medications for this encounter.   Physical Findings: The patient is in no acute distress. Patient is alert and oriented.  height is 5\' 2"  (1.575 m) and weight is 168 lb 4.8 oz (76.34 kg). Her oral temperature is 98.8 F (37.1 C). Her blood pressure is 162/64 and her pulse is 100. Her respiration is 20.   Vaginal cuff intact. Some erthyma to the vaginal cuff. No mucosal lesions noted. No pelvic masses appreciated.  The patient  proceeded to undergo fitting for her vaginal cylinder. The optimal diameter to distend the vaginal vault without undue discomfort was a 2.5 cm segmented cylinder. Patient tolerated the procedure well    Impression: Successful vaginal brachii therapy procedure  Plan:  The pt will undergo CT simulation and intracavitary brachytherapy later today.  This document serves as a record of services personally performed by Gery Pray, MD. It was created on his behalf by Yesenia Reynolds, a trained medical scribe. The creation of this record is based on the scribe's personal observations and the provider's statements to them. This document has been checked and approved by the attending provider.  ____________________________________  Yesenia Promise, PhD, MD

## 2015-08-08 NOTE — Progress Notes (Signed)
  Radiation Oncology         (336) [phone] ________________________________  Name: Yesenia Reynolds MRN: [id number]  Date: 08/08/2015  DOB: 1941/01/18  Simple treatment device  Note    ICD-9-CM ICD-10-CM   1. Endometrial cancer 182.0 C54.1   2. Endometrial cancer, FIGO stage IIIC 182.0 C54.1     Status: outpatient  NARRATIVE: The patient patient had construction of her custom vaginal cylinder for HDR treatment. The optimal diameter to distend the vaginal vault without undue discomfort was a 2.5 cm segmented cylinder. -----------------------------------  Blair Promise, PhD, MD

## 2015-08-08 NOTE — Progress Notes (Signed)
Please see the Nurse Progress Note in the MD Initial Consult Encounter for this patient. 

## 2015-08-08 NOTE — Progress Notes (Signed)
GYN Location of Tumor / Histology: Endometrial  Yesenia Reynolds presented  months ago with symptoms of: only 1 time bleeding 3-4 days ago  Biopsies of  (if applicable) revealed:   Past/Anticipated interventions by Gyn/Onc surgery, if any: Total Hysterectomy 3/84/66, Dr. Corena Herter, [facility]  Past/Anticipated interventions by medical oncology, if any: Dr. Drue Stager  Last seen 1 week ago, F/u   Weight changes, if any: 20 lb weight loss since April 02016 after surgery  Bowel/Bladder complaints, if any: Yes.  , urinary frequency,urgency,  Had UTI completed Cipro 1.5 week ago,has taken AZO for 2 days, slight burning, , takes miralax  Prn , Bowel movemnts every 3-4 days Nausea/Vomiting, if any: yes slight nausea, poor appetite  Pain issues, if any: occasional twinges lower abdomen  SAFETY ISSUES:  Prior radiation? Yes. 25 endometrial in [facility]   Pacemaker/ICD? no  Possible current pregnancy? no  Is the patient on methotrexate? no  Current Complaints / other details:  Single, G0Po, Maternal aunt stomach c, maternal Uncle pancreatic cancer   BP 162/64 mmHg  Pulse 100  Temp(Src) 98.8 F (37.1 C) (Oral)  Resp 20  Ht 5\' 2"  (1.575 m)  Wt 168 lb 4.8 oz (76.34 kg)  BMI 30.77 kg/m2  Wt Readings from Last 3 Encounters:  08/08/15 168 lb 4.8 oz (76.34 kg)  03/13/15 186 lb (84.369 kg)  04/04/14 195 lb (88.451 kg)   pateint took percocet 5/325mg  at 0845 pain back and knees

## 2015-08-10 ENCOUNTER — Telehealth: Payer: Self-pay | Admitting: *Deleted

## 2015-08-10 NOTE — Telephone Encounter (Signed)
CALLED PATIENT TO REMIND OF HDR TX. FOR 08-14-15 @ 10 AM, SPOKE WITH PATIENT AND SHE IS AWARE OF THIS APPT.

## 2015-08-14 ENCOUNTER — Ambulatory Visit
Admission: RE | Admit: 2015-08-14 | Discharge: 2015-08-14 | Disposition: A | Discharge status: Home / Self Care | Payer: Medicare Other | Source: Ambulatory Visit | Attending: Radiation Oncology | Admitting: Radiation Oncology

## 2015-08-14 ENCOUNTER — Telehealth: Payer: Self-pay | Admitting: Oncology

## 2015-08-14 DIAGNOSIS — Z510 Encounter for antineoplastic radiation therapy: Secondary | ICD-10-CM | POA: Diagnosis not present

## 2015-08-14 DIAGNOSIS — C541 Malignant neoplasm of endometrium: Secondary | ICD-10-CM

## 2015-08-14 MED ORDER — OXYCODONE-ACETAMINOPHEN 5-325 MG PO TABS: 1.0000 | ORAL_TABLET | Freq: Three times a day (TID) | ORAL | Status: DC | PRN

## 2015-08-14 NOTE — Telephone Encounter (Signed)
Called Yesenia Reynolds and advised her that her script for Percocet is available for pick up in the [place] area.  Ethell said she would pick up the prescription on Monday.

## 2015-08-14 NOTE — Progress Notes (Signed)
Simple treatment device Note    ICD-9-CM ICD-10-CM   1. Endometrial cancer 182.0 C54.1   2. Endometrial cancer, FIGO stage IIIC 182.0 C54.1     Status: outpatient  NARRATIVE: The patient patient had construction of her custom vaginal cylinder for HDR treatment. The optimal diameter to distend the vaginal vault without undue discomfort was a 2.5 cm segmented cylinder. -----------------------------------      Vaginal brac hii therapy procedure  Patient was taken to the high-dose-rate suite and placed in the dorsolithotomy position. The patient's custom vaginal cylinder was placed in the proximal vagina. This was affixed to the CT/MR stabilization plate to prevent slippage. Patient tolerated the procedure well.  Verification simulation  A fiducial marker was placed within the vaginal cylinder. The patient then proceeded to have an AP and lateral film. This compared to the patient's planning films documenting accurate position of the vaginal cylinder for treatment.   High-dose-rate brachytherapy treatment:  The remote afterloading device was affixed to the vaginal cylinder by catheter system. Patient then proceeded to undergo her second high-dose-rate treatment directed at the proximal vagina. Patient was prescribed a dose of 6 gray to be delivered to the vaginal mucosal surface. This was achieved with a total dwell time of 211.10 seconds. The patient was treated with 1 channel using 7 dwell positions. Patient tolerated the procedure well except for pain in her back and pelvis from lying on the treatment table (chronic problem for her). After treatment was complete a radiation survey was performed documenting return of the iridium source into the gamma med safe.  Plan: Patient will return next week for her third high-dose-rate brachii therapy treatment using iridium 192 as the high-dose-rate source.

## 2015-08-15 ENCOUNTER — Encounter: Payer: Self-pay | Admitting: Skilled Nursing Facility1

## 2015-08-15 NOTE — Progress Notes (Signed)
Subjective:     Patient ID: Yesenia Reynolds, female   DOB: February 15, 1941, 74 y.o.   MRN: [id number]  HPI   Review of Systems     Objective:   Physical Exam To assist the pt in identifying some dietary strategies to gain some lost wt back.    Assessment:     Pt identified as being malnourished due to wt loss. Pt was contacted via the telephone at [phone]. Pt states she has lost about 20 pounds and her appetite is good. Pt states she has no appetite in the morning but she does have one on the afternoon and evening. Dietitian asked if she was capitilizing on that appetite and she stated she is stuffing food in her mouth whenever she can. Pt states she does have diabetes and is controlling it through diet.   Plan:     Dietitian advised if she was having a craving for pure carbohydrate like just plane mashed potatoes to add at least a fat or if she can fit in a protein. Dietitian congratulated her on eating when she can and to keep up the great work.

## 2015-08-21 ENCOUNTER — Encounter: Payer: Self-pay | Admitting: Radiation Oncology

## 2015-08-21 ENCOUNTER — Ambulatory Visit
Admission: RE | Admit: 2015-08-21 | Discharge: 2015-08-21 | Disposition: A | Discharge status: Home / Self Care | Payer: Medicare Other | Source: Ambulatory Visit | Attending: Radiation Oncology | Admitting: Radiation Oncology

## 2015-08-21 DIAGNOSIS — C541 Malignant neoplasm of endometrium: Secondary | ICD-10-CM

## 2015-08-21 DIAGNOSIS — Z510 Encounter for antineoplastic radiation therapy: Secondary | ICD-10-CM | POA: Diagnosis not present

## 2015-08-21 NOTE — Progress Notes (Signed)
Simple treatment device Note    ICD-9-CM ICD-10-CM   1. Endometrial cancer 182.0 C54.1   2. Endometrial cancer, FIGO stage IIIC 182.0 C54.1     Status: outpatient  NARRATIVE: The patient patient had construction of her custom vaginal cylinder for HDR treatment. The optimal diameter to distend the vaginal vault without undue discomfort was a 2.5 cm segmented cylinder. -----------------------------------      Vaginal brachytherapy procedure  Patient was taken to the high-dose-rate suite and placed in the dorsolithotomy position. The patient's custom vaginal cylinder was placed in the proximal vagina. This was affixed to the CT/MR stabilization plate to prevent slippage. Patient tolerated the procedure well.  Verification simulation  A fiducial marker was placed within the vaginal cylinder. The patient then proceeded to have an AP and lateral film. This compared to the patient's planning films documenting accurate position of the vaginal cylinder for treatment.   High-dose-rate brachytherapy treatment:  The remote afterloading device was affixed to the vaginal cylinder by catheter system. Patient then proceeded to undergo her third high-dose-rate treatment directed at the proximal vagina. Patient was prescribed a dose of 6 gray to be delivered to the vaginal mucosal surface. This was achieved with a total dwell time of 225.4  seconds. The patient was treated with 1 channel using 7 dwell positions. Patient tolerated the procedure well except for pain in her back and pelvis from lying on the treatment table (chronic problem for her). After treatment was complete a radiation survey was performed documenting return of the iridium source into the gamma med safe.

## 2015-09-09 NOTE — Progress Notes (Signed)
  Radiation Oncology         (336) [phone] ________________________________  Name: Yesenia Reynolds MRN: [id number]  Date: 08/21/2015  DOB: Apr 17, 1941  End of Treatment Note  Diagnosis:      ICD-9-CM ICD-10-CM   1. Endometrial cancer, FIGO stage IIIC 182.0 C54.1        Indication for treatment:  Risk for vaginal cuff recurrence       Radiation treatment dates:   08/08/2015, 08/14/2015, 08/21/2015  Site/dose:   Vaginal cuff, 18 gray in 3 fractions  Beams/energy:   Intracavitary brachytherapy treatments using iridium 192 as the high-dose-rate source. A 2.5 cm diameter cylinder was used for the treatment. Prescription was to the mucosal surface, a 3 cm treatment length was delivered  Narrative: The patient tolerated radiation treatment relatively well.   Minimal discomfort within the distal vagina and with urinary symptoms  Plan: The patient has completed radiation treatment. The patient will return to radiation oncology clinic for routine followup in one month. I advised them to call or return sooner if they have any questions or concerns related to their recovery or treatment. The patient previously received external beam radiation treatments at the [facility] in [place] (45 Gy). She will continue follow-up at this facility  -----------------------------------  Blair Promise, PhD, MD

## 2015-10-15 DIAGNOSIS — D649 Anemia, unspecified: Secondary | ICD-10-CM

## 2015-10-15 DIAGNOSIS — D6489 Other specified anemias: Secondary | ICD-10-CM | POA: Insufficient documentation

## 2015-10-15 DIAGNOSIS — G629 Polyneuropathy, unspecified: Secondary | ICD-10-CM | POA: Insufficient documentation

## 2015-11-21 DIAGNOSIS — F329 Major depressive disorder, single episode, unspecified: Secondary | ICD-10-CM | POA: Insufficient documentation

## 2015-11-21 DIAGNOSIS — T451X5A Adverse effect of antineoplastic and immunosuppressive drugs, initial encounter: Secondary | ICD-10-CM

## 2015-11-21 DIAGNOSIS — D6481 Anemia due to antineoplastic chemotherapy: Secondary | ICD-10-CM | POA: Insufficient documentation

## 2015-11-21 DIAGNOSIS — R530 Neoplastic (malignant) related fatigue: Secondary | ICD-10-CM | POA: Insufficient documentation

## 2015-12-04 DIAGNOSIS — Z95828 Presence of other vascular implants and grafts: Secondary | ICD-10-CM | POA: Insufficient documentation

## 2015-12-31 DIAGNOSIS — C541 Malignant neoplasm of endometrium: Secondary | ICD-10-CM | POA: Diagnosis not present

## 2016-01-01 DIAGNOSIS — F419 Anxiety disorder, unspecified: Secondary | ICD-10-CM | POA: Diagnosis not present

## 2016-01-01 DIAGNOSIS — M5431 Sciatica, right side: Secondary | ICD-10-CM | POA: Diagnosis not present

## 2016-01-01 DIAGNOSIS — D701 Agranulocytosis secondary to cancer chemotherapy: Secondary | ICD-10-CM | POA: Diagnosis not present

## 2016-01-01 DIAGNOSIS — Z8542 Personal history of malignant neoplasm of other parts of uterus: Secondary | ICD-10-CM | POA: Diagnosis not present

## 2016-01-01 DIAGNOSIS — G479 Sleep disorder, unspecified: Secondary | ICD-10-CM | POA: Diagnosis not present

## 2016-01-01 DIAGNOSIS — M5432 Sciatica, left side: Secondary | ICD-10-CM | POA: Diagnosis not present

## 2016-01-01 DIAGNOSIS — C541 Malignant neoplasm of endometrium: Secondary | ICD-10-CM | POA: Diagnosis not present

## 2016-01-01 DIAGNOSIS — Z90722 Acquired absence of ovaries, bilateral: Secondary | ICD-10-CM | POA: Diagnosis not present

## 2016-01-01 DIAGNOSIS — Z08 Encounter for follow-up examination after completed treatment for malignant neoplasm: Secondary | ICD-10-CM | POA: Diagnosis not present

## 2016-01-01 DIAGNOSIS — F329 Major depressive disorder, single episode, unspecified: Secondary | ICD-10-CM | POA: Insufficient documentation

## 2016-01-01 DIAGNOSIS — T451X5S Adverse effect of antineoplastic and immunosuppressive drugs, sequela: Secondary | ICD-10-CM | POA: Diagnosis not present

## 2016-01-01 DIAGNOSIS — Z90710 Acquired absence of both cervix and uterus: Secondary | ICD-10-CM | POA: Diagnosis not present

## 2016-01-01 DIAGNOSIS — Z23 Encounter for immunization: Secondary | ICD-10-CM | POA: Diagnosis not present

## 2016-01-01 DIAGNOSIS — D6959 Other secondary thrombocytopenia: Secondary | ICD-10-CM | POA: Diagnosis not present

## 2016-01-03 DIAGNOSIS — Z8542 Personal history of malignant neoplasm of other parts of uterus: Secondary | ICD-10-CM | POA: Diagnosis not present

## 2016-01-03 DIAGNOSIS — F418 Other specified anxiety disorders: Secondary | ICD-10-CM | POA: Diagnosis not present

## 2016-01-03 DIAGNOSIS — F489 Nonpsychotic mental disorder, unspecified: Secondary | ICD-10-CM | POA: Diagnosis not present

## 2016-01-04 DIAGNOSIS — E1165 Type 2 diabetes mellitus with hyperglycemia: Secondary | ICD-10-CM | POA: Diagnosis not present

## 2016-01-15 DIAGNOSIS — F489 Nonpsychotic mental disorder, unspecified: Secondary | ICD-10-CM | POA: Diagnosis not present

## 2016-01-21 DIAGNOSIS — F329 Major depressive disorder, single episode, unspecified: Secondary | ICD-10-CM | POA: Diagnosis not present

## 2016-01-21 DIAGNOSIS — Z923 Personal history of irradiation: Secondary | ICD-10-CM | POA: Diagnosis not present

## 2016-01-21 DIAGNOSIS — C541 Malignant neoplasm of endometrium: Secondary | ICD-10-CM | POA: Diagnosis not present

## 2016-01-21 DIAGNOSIS — F419 Anxiety disorder, unspecified: Secondary | ICD-10-CM | POA: Diagnosis not present

## 2016-01-29 DIAGNOSIS — F418 Other specified anxiety disorders: Secondary | ICD-10-CM | POA: Diagnosis not present

## 2016-01-29 DIAGNOSIS — F489 Nonpsychotic mental disorder, unspecified: Secondary | ICD-10-CM | POA: Diagnosis not present

## 2016-01-29 DIAGNOSIS — Z6827 Body mass index (BMI) 27.0-27.9, adult: Secondary | ICD-10-CM | POA: Diagnosis not present

## 2016-02-12 DIAGNOSIS — F418 Other specified anxiety disorders: Secondary | ICD-10-CM | POA: Diagnosis not present

## 2016-02-12 DIAGNOSIS — F489 Nonpsychotic mental disorder, unspecified: Secondary | ICD-10-CM | POA: Diagnosis not present

## 2016-02-12 DIAGNOSIS — Z8542 Personal history of malignant neoplasm of other parts of uterus: Secondary | ICD-10-CM | POA: Diagnosis not present

## 2016-02-18 DIAGNOSIS — Z95828 Presence of other vascular implants and grafts: Secondary | ICD-10-CM | POA: Diagnosis not present

## 2016-02-18 DIAGNOSIS — D649 Anemia, unspecified: Secondary | ICD-10-CM | POA: Diagnosis not present

## 2016-02-18 DIAGNOSIS — F419 Anxiety disorder, unspecified: Secondary | ICD-10-CM | POA: Diagnosis not present

## 2016-02-18 DIAGNOSIS — F329 Major depressive disorder, single episode, unspecified: Secondary | ICD-10-CM | POA: Diagnosis not present

## 2016-02-18 DIAGNOSIS — C541 Malignant neoplasm of endometrium: Secondary | ICD-10-CM | POA: Diagnosis not present

## 2016-02-19 DIAGNOSIS — F418 Other specified anxiety disorders: Secondary | ICD-10-CM | POA: Diagnosis not present

## 2016-02-20 DIAGNOSIS — I7090 Unspecified atherosclerosis: Secondary | ICD-10-CM | POA: Diagnosis not present

## 2016-02-20 DIAGNOSIS — C541 Malignant neoplasm of endometrium: Secondary | ICD-10-CM | POA: Diagnosis not present

## 2016-02-20 DIAGNOSIS — Z90710 Acquired absence of both cervix and uterus: Secondary | ICD-10-CM | POA: Diagnosis not present

## 2016-02-21 DIAGNOSIS — I10 Essential (primary) hypertension: Secondary | ICD-10-CM | POA: Diagnosis not present

## 2016-02-21 DIAGNOSIS — E7800 Pure hypercholesterolemia, unspecified: Secondary | ICD-10-CM | POA: Diagnosis not present

## 2016-02-26 DIAGNOSIS — F418 Other specified anxiety disorders: Secondary | ICD-10-CM | POA: Diagnosis not present

## 2016-03-04 ENCOUNTER — Other Ambulatory Visit: Payer: Self-pay | Admitting: Cardiology

## 2016-03-04 DIAGNOSIS — F418 Other specified anxiety disorders: Secondary | ICD-10-CM | POA: Diagnosis not present

## 2016-03-17 DIAGNOSIS — G629 Polyneuropathy, unspecified: Secondary | ICD-10-CM | POA: Diagnosis not present

## 2016-03-17 DIAGNOSIS — K5904 Chronic idiopathic constipation: Secondary | ICD-10-CM | POA: Diagnosis not present

## 2016-03-17 DIAGNOSIS — C541 Malignant neoplasm of endometrium: Secondary | ICD-10-CM | POA: Diagnosis not present

## 2016-03-17 DIAGNOSIS — F329 Major depressive disorder, single episode, unspecified: Secondary | ICD-10-CM | POA: Diagnosis not present

## 2016-03-19 DIAGNOSIS — F418 Other specified anxiety disorders: Secondary | ICD-10-CM | POA: Diagnosis not present

## 2016-03-25 DIAGNOSIS — F418 Other specified anxiety disorders: Secondary | ICD-10-CM | POA: Diagnosis not present

## 2016-04-02 DIAGNOSIS — C541 Malignant neoplasm of endometrium: Secondary | ICD-10-CM | POA: Diagnosis not present

## 2016-04-02 DIAGNOSIS — Z95828 Presence of other vascular implants and grafts: Secondary | ICD-10-CM | POA: Diagnosis not present

## 2016-04-07 DIAGNOSIS — M47812 Spondylosis without myelopathy or radiculopathy, cervical region: Secondary | ICD-10-CM | POA: Diagnosis not present

## 2016-04-07 DIAGNOSIS — M531 Cervicobrachial syndrome: Secondary | ICD-10-CM | POA: Diagnosis not present

## 2016-04-07 DIAGNOSIS — M9901 Segmental and somatic dysfunction of cervical region: Secondary | ICD-10-CM | POA: Diagnosis not present

## 2016-04-08 DIAGNOSIS — M531 Cervicobrachial syndrome: Secondary | ICD-10-CM | POA: Diagnosis not present

## 2016-04-08 DIAGNOSIS — M47812 Spondylosis without myelopathy or radiculopathy, cervical region: Secondary | ICD-10-CM | POA: Diagnosis not present

## 2016-04-08 DIAGNOSIS — M9901 Segmental and somatic dysfunction of cervical region: Secondary | ICD-10-CM | POA: Diagnosis not present

## 2016-04-10 DIAGNOSIS — M9902 Segmental and somatic dysfunction of thoracic region: Secondary | ICD-10-CM | POA: Diagnosis not present

## 2016-04-10 DIAGNOSIS — M531 Cervicobrachial syndrome: Secondary | ICD-10-CM | POA: Diagnosis not present

## 2016-04-10 DIAGNOSIS — M9901 Segmental and somatic dysfunction of cervical region: Secondary | ICD-10-CM | POA: Diagnosis not present

## 2016-04-10 DIAGNOSIS — M47812 Spondylosis without myelopathy or radiculopathy, cervical region: Secondary | ICD-10-CM | POA: Diagnosis not present

## 2016-04-10 DIAGNOSIS — M546 Pain in thoracic spine: Secondary | ICD-10-CM | POA: Diagnosis not present

## 2016-04-10 DIAGNOSIS — M47816 Spondylosis without myelopathy or radiculopathy, lumbar region: Secondary | ICD-10-CM | POA: Diagnosis not present

## 2016-04-10 DIAGNOSIS — M9903 Segmental and somatic dysfunction of lumbar region: Secondary | ICD-10-CM | POA: Diagnosis not present

## 2016-04-11 DIAGNOSIS — E039 Hypothyroidism, unspecified: Secondary | ICD-10-CM | POA: Diagnosis not present

## 2016-04-11 DIAGNOSIS — I10 Essential (primary) hypertension: Secondary | ICD-10-CM | POA: Diagnosis not present

## 2016-04-11 DIAGNOSIS — L89309 Pressure ulcer of unspecified buttock, unspecified stage: Secondary | ICD-10-CM | POA: Diagnosis not present

## 2016-04-11 DIAGNOSIS — F418 Other specified anxiety disorders: Secondary | ICD-10-CM | POA: Diagnosis not present

## 2016-04-11 DIAGNOSIS — E1165 Type 2 diabetes mellitus with hyperglycemia: Secondary | ICD-10-CM | POA: Diagnosis not present

## 2016-04-15 ENCOUNTER — Ambulatory Visit (HOSPITAL_COMMUNITY): Payer: Self-pay | Admitting: Psychiatry

## 2016-05-06 DIAGNOSIS — I10 Essential (primary) hypertension: Secondary | ICD-10-CM | POA: Diagnosis not present

## 2016-05-06 DIAGNOSIS — R52 Pain, unspecified: Secondary | ICD-10-CM | POA: Diagnosis not present

## 2016-05-06 DIAGNOSIS — S1096XA Insect bite of unspecified part of neck, initial encounter: Secondary | ICD-10-CM | POA: Diagnosis not present

## 2016-05-06 DIAGNOSIS — Z7982 Long term (current) use of aspirin: Secondary | ICD-10-CM | POA: Diagnosis not present

## 2016-05-06 DIAGNOSIS — E119 Type 2 diabetes mellitus without complications: Secondary | ICD-10-CM | POA: Diagnosis not present

## 2016-05-06 DIAGNOSIS — C541 Malignant neoplasm of endometrium: Secondary | ICD-10-CM | POA: Diagnosis not present

## 2016-05-06 DIAGNOSIS — W57XXXA Bitten or stung by nonvenomous insect and other nonvenomous arthropods, initial encounter: Secondary | ICD-10-CM | POA: Diagnosis not present

## 2016-05-06 DIAGNOSIS — M1990 Unspecified osteoarthritis, unspecified site: Secondary | ICD-10-CM | POA: Diagnosis not present

## 2016-05-06 DIAGNOSIS — Z95828 Presence of other vascular implants and grafts: Secondary | ICD-10-CM | POA: Diagnosis not present

## 2016-05-06 DIAGNOSIS — H409 Unspecified glaucoma: Secondary | ICD-10-CM | POA: Diagnosis not present

## 2016-05-06 DIAGNOSIS — Z79899 Other long term (current) drug therapy: Secondary | ICD-10-CM | POA: Diagnosis not present

## 2016-05-12 DIAGNOSIS — C541 Malignant neoplasm of endometrium: Secondary | ICD-10-CM | POA: Diagnosis not present

## 2016-05-12 DIAGNOSIS — Z8542 Personal history of malignant neoplasm of other parts of uterus: Secondary | ICD-10-CM | POA: Diagnosis not present

## 2016-05-14 DIAGNOSIS — Z95828 Presence of other vascular implants and grafts: Secondary | ICD-10-CM | POA: Diagnosis not present

## 2016-05-16 IMAGING — CR DG LUMBAR SPINE 2-3V
3 series · 3 of 3 positions shown · non-contrast
Comparison: CT abdomen/pelvis 02/26/2015

CLINICAL DATA: Chronic low back pain

EXAM:
LUMBAR SPINE - 2-3 VIEW

[t lumbar spine ap]
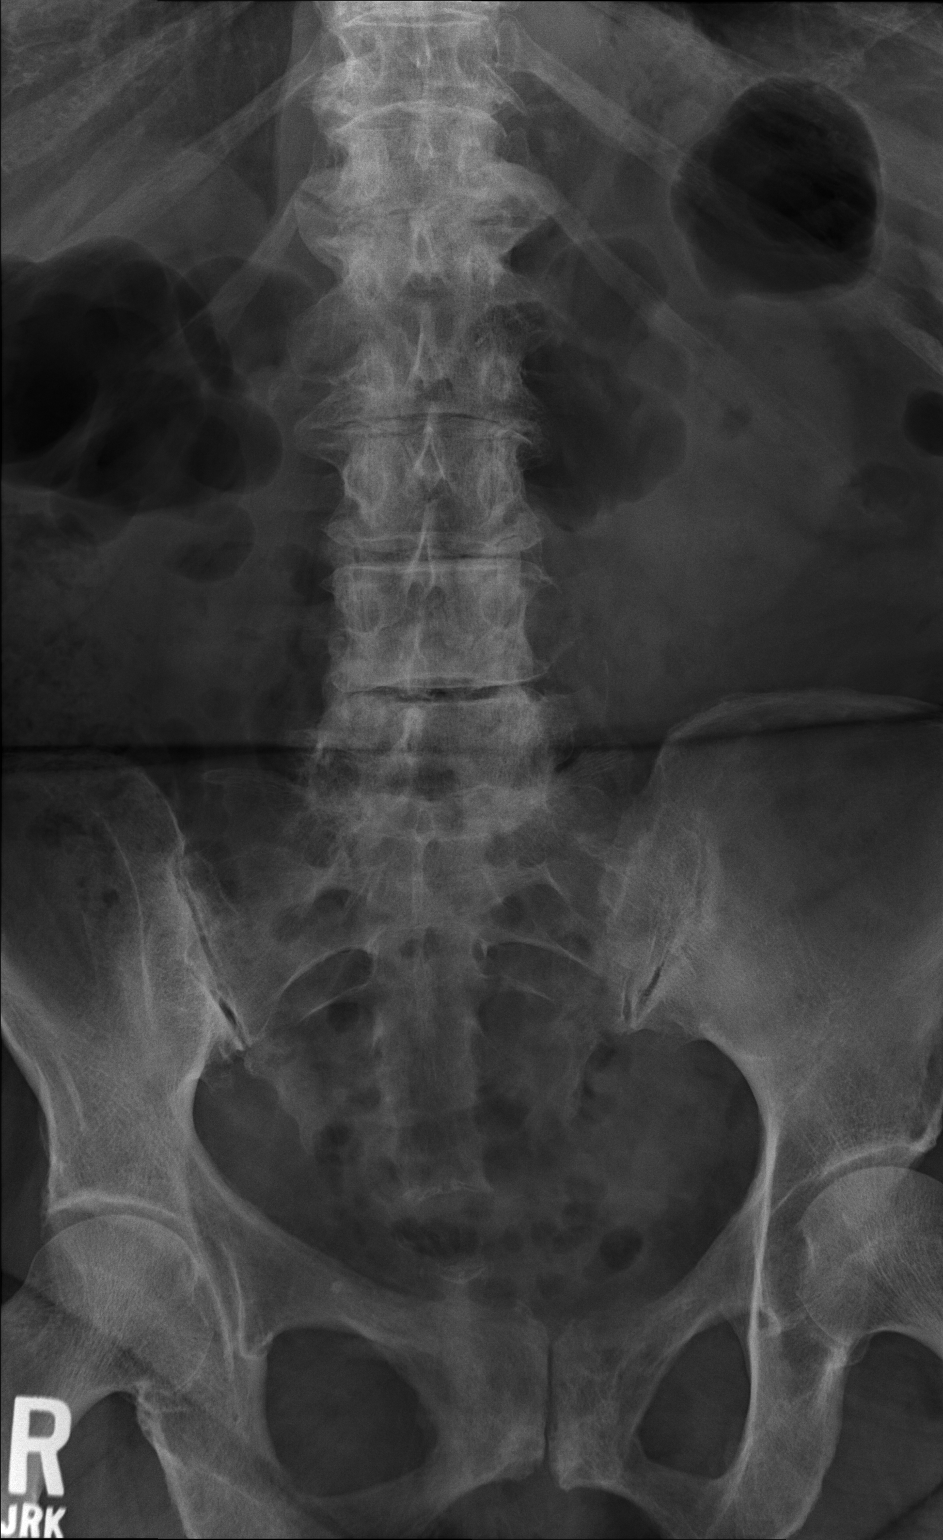

[t lumbar spine lat]
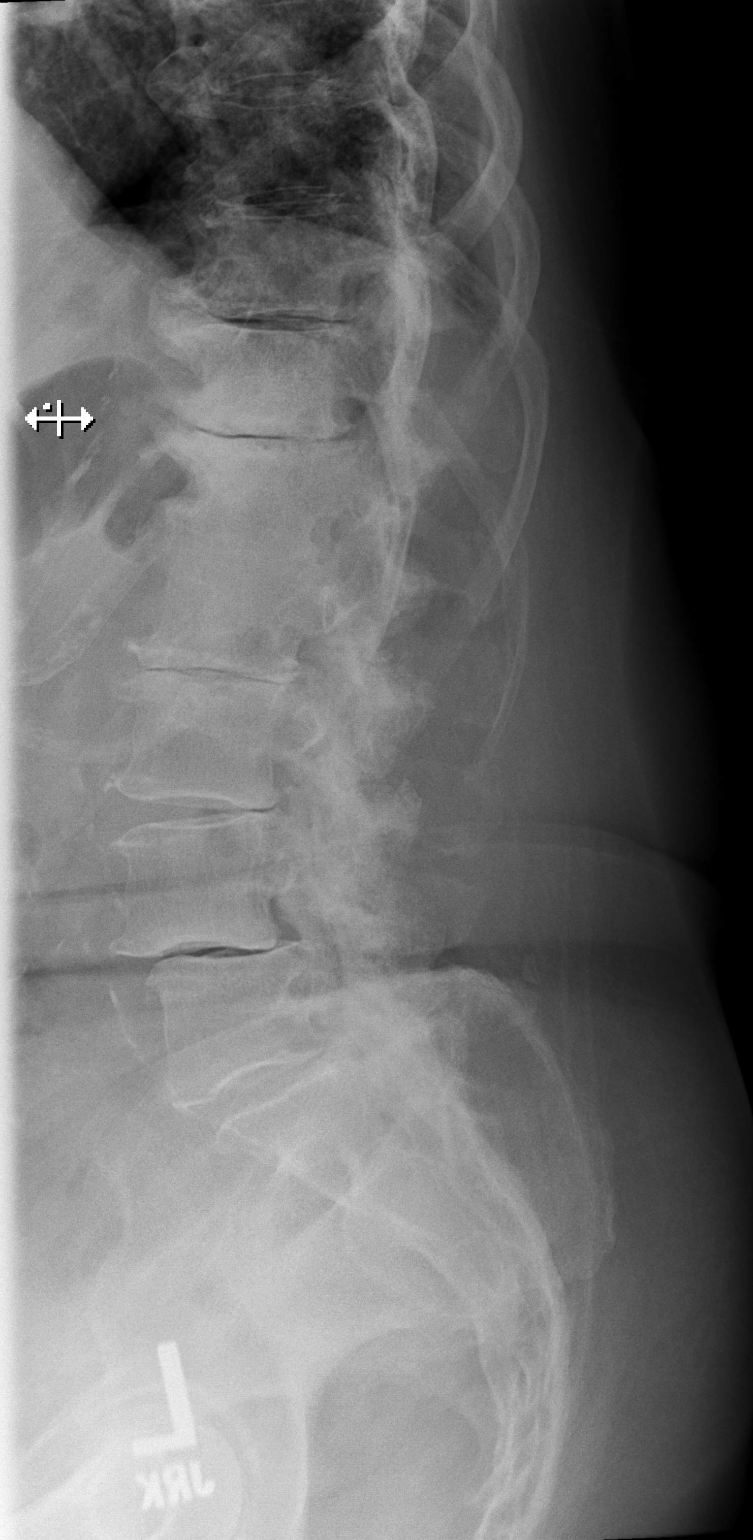

[t lumbar l-5 s-1 spot]
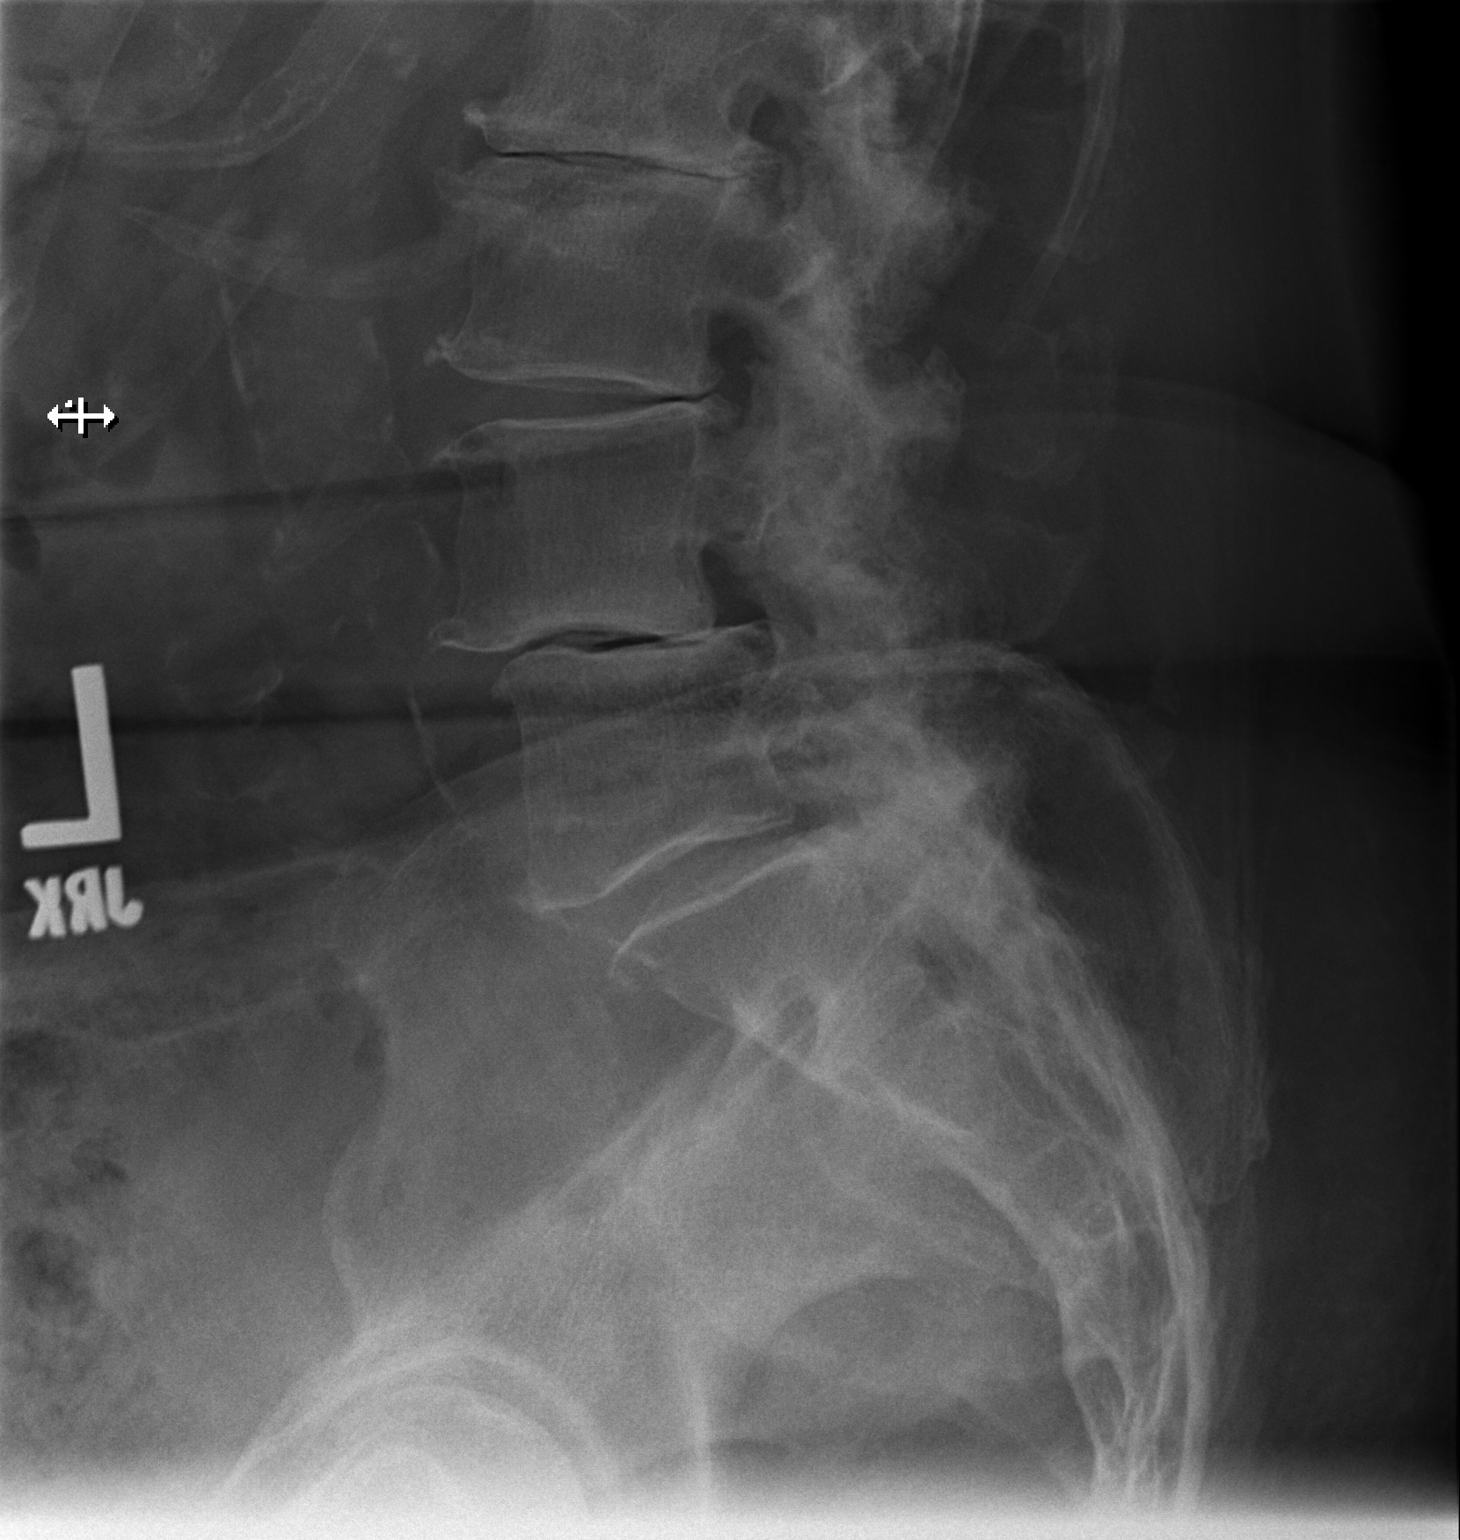

[3 of 3 positions shown; findings below may reference images not displayed]

FINDINGS: 5 non rib-bearing lumbar type vertebral bodies are identified.
Multilevel disc degenerative change noted with loss of
intervertebral disc space height at multiple levels and vacuum disc
phenomenon. Apparent fusion at L1-L2. 3 mm anterolisthesis of L4 on
L5 is noted. Vertebral body heights are preserved. Atheromatous
aortic calcification without calcified aneurysm.
IMPRESSION: Multilevel disc degenerative change.

## 2016-05-19 DIAGNOSIS — I10 Essential (primary) hypertension: Secondary | ICD-10-CM | POA: Diagnosis not present

## 2016-05-19 DIAGNOSIS — E7800 Pure hypercholesterolemia, unspecified: Secondary | ICD-10-CM | POA: Diagnosis not present

## 2016-05-21 ENCOUNTER — Encounter: Payer: Self-pay | Admitting: Cardiology

## 2016-05-21 ENCOUNTER — Ambulatory Visit (INDEPENDENT_AMBULATORY_CARE_PROVIDER_SITE_OTHER): Payer: Medicare Other | Admitting: Cardiology

## 2016-05-21 VITALS — BP 122/78 | HR 65 | Ht 62.0 in | Wt 147.0 lb

## 2016-05-21 DIAGNOSIS — R002 Palpitations: Secondary | ICD-10-CM | POA: Diagnosis not present

## 2016-05-21 DIAGNOSIS — I6523 Occlusion and stenosis of bilateral carotid arteries: Secondary | ICD-10-CM | POA: Diagnosis not present

## 2016-05-21 DIAGNOSIS — I10 Essential (primary) hypertension: Secondary | ICD-10-CM

## 2016-05-21 NOTE — Patient Instructions (Signed)

## 2016-05-21 NOTE — Progress Notes (Signed)
Cardiology Office Note  Date: 05/21/2016   ID: Yesenia Reynolds, DOB January 18, 1941, MRN [id number]  PCP: Glenda Chroman, MD  Primary Cardiologist: Rozann Lesches, MD   Chief Complaint  Patient presents with  . Follow-up palpitations    History of Present Illness: Yesenia Reynolds is a 75 y.o. female last seen in April 2016. She presents for a follow-up visit. She is here today with a friend. Now status post treatment of endometrial cancer, she underwent surgery in [place] and was recently following with the [facility] Oncology service, now planning to establish at [facility]. She has had no significant palpitations on beta blocker.  I reviewed her ECG today which shows normal sinus rhythm.  She is now following with Dr. Woody Seller. I reviewed her medications which are outlined below.  Past Medical History  Diagnosis Date  . Mixed hyperlipidemia   . Hypothyroidism   . Essential hypertension, benign   . Arthritis   . Carotid artery disease (HCC)     Less than 50% ICA stenoses 6/13  . Type 2 diabetes mellitus ([place])   . GERD (gastroesophageal reflux disease)   . Spinal stenosis   . Palpitations     Current Outpatient Prescriptions  Medication Sig Dispense Refill  . ALPRAZolam (XANAX) 0.25 MG tablet Take 0.25 mg by mouth at bedtime as needed for anxiety.    Marland Kitchen aspirin 81 MG tablet Take 81 mg by mouth daily.    Marland Kitchen atorvastatin (LIPITOR) 10 MG tablet Take 10 mg by mouth every evening.     . diclofenac sodium (VOLTAREN) 1 % GEL Place 1 application onto the skin as needed.    . fentaNYL (DURAGESIC - DOSED MCG/HR) 75 MCG/HR Place 75 mcg onto the skin every 3 (three) days.    Marland Kitchen ibuprofen (ADVIL,MOTRIN) 200 MG tablet Take 200 mg by mouth every 6 (six) hours as needed.    . lamoTRIgine (LAMICTAL) 100 MG tablet Take 100 mg by mouth 2 (two) times daily.    Marland Kitchen latanoprost (XALATAN) 0.005 % ophthalmic solution Place 1 drop into both eyes at bedtime.    Marland Kitchen levothyroxine (SYNTHROID, LEVOTHROID) 88  MCG tablet Take 88 mcg by mouth daily before breakfast.     . lidocaine-prilocaine (EMLA) cream Apply 1 application topically as needed. With chemo    . lisinopril (PRINIVIL,ZESTRIL) 10 MG tablet Take 10 mg by mouth every morning.     . magnesium oxide (MAG-OX) 400 MG tablet Take 400 mg by mouth 2 (two) times daily.    . metoprolol succinate (TOPROL-XL) 25 MG 24 hr tablet TAKE 1/2 TABLET BY MOUTH EVERY MORNING 45 tablet 0  . ondansetron (ZOFRAN) 8 MG tablet Take 8 mg by mouth as needed.    . polyethylene glycol (MIRALAX / GLYCOLAX) packet Take 17 g by mouth daily.    . prochlorperazine (COMPAZINE) 10 MG tablet Take 10 mg by mouth as needed.    Marland Kitchen acetaminophen (TYLENOL) 500 MG tablet Take 500 mg by mouth 3 (three) times daily.     No current facility-administered medications for this visit.   Allergies:  Morphine and related and Paxil   Social History: The patient  reports that she has never smoked. She has never used smokeless tobacco. She reports that she does not drink alcohol or use illicit drugs.   ROS:  Please see the history of present illness. Otherwise, complete review of systems is positive for trouble with memory following chemotherapy.  All other systems are reviewed and negative.  Physical Exam: VS:  BP 122/78 mmHg  Pulse 65  Ht 5\' 2"  (1.575 m)  Wt 147 lb (66.679 kg)  BMI 26.88 kg/m2  SpO2 98%, BMI Body mass index is 26.88 kg/(m^2).  Wt Readings from Last 3 Encounters:  05/21/16 147 lb (66.679 kg)  08/08/15 168 lb 4.8 oz (76.34 kg)  03/13/15 186 lb (84.369 kg)    Overweight woman in no acute distress.  HEENT: Conjunctiva and lids normal, oropharynx clear.  Neck: Supple, no elevated JVP or carotid bruits, no thyromegaly.  Lungs: Clear to auscultation, nonlabored breathing at rest.  Cardiac: Regular rate and rhythm, no S3 with soft systolic murmur, no pericardial rub.  Abdomen: Soft, nontender, bowel sounds present, no guarding or rebound.  Extremities: No  pitting edema, distal pulses 2+.   ECG: I personally reviewed the prior tracing from 03/13/2015 which showed normal sinus rhythm.  Other Studies Reviewed Today:  Echocardiogram 05/17/2012 [facility]): LVEF [id number], grade 1 diastolic dysfunction, thickened mitral leaflets with mild mitral regurgitation, mild to moderate aortic regurgitation, minimal pericardial effusion versus epicardial fat pad.  Assessment and Plan:  1. History of palpitations, quiesced and on beta blocker therapy and ECG normal today. We have discussed considering stopping the medication if she would like, however with stable symptoms she prefers to continue with observation for now.  2. Essential hypertension, blood pressure is well controlled today. She continues on Toprol-XL and lisinopril.  3. History of nonobstructive carotid artery disease, asymptomatic. She is on aspirin and statin therapy.  Current medicines were reviewed with the patient today.   Orders Placed This Encounter  Procedures  . EKG 12-Lead    Disposition: FU with me in 1 year.   Signed, Satira Sark, MD, [facility] 05/21/2016 1:35 PM    [facility] [place], [place], [place] [address] Phone: [phone]; Fax: [phone]

## 2016-05-29 ENCOUNTER — Encounter (HOSPITAL_COMMUNITY): Payer: Self-pay | Admitting: Psychiatry

## 2016-05-29 ENCOUNTER — Ambulatory Visit (INDEPENDENT_AMBULATORY_CARE_PROVIDER_SITE_OTHER): Payer: Medicare Other | Admitting: Psychiatry

## 2016-05-29 VITALS — BP 149/71 | HR 75 | Ht 62.0 in | Wt 147.0 lb

## 2016-05-29 DIAGNOSIS — F411 Generalized anxiety disorder: Secondary | ICD-10-CM | POA: Diagnosis not present

## 2016-05-29 MED ORDER — ALPRAZOLAM 0.25 MG PO TABS: 0.2500 mg | ORAL_TABLET | Freq: Four times a day (QID) | ORAL | Status: DC

## 2016-05-29 MED ORDER — LAMOTRIGINE 100 MG PO TABS
ORAL_TABLET | ORAL | Status: DC
Start: 2016-05-29 — End: 2016-07-09

## 2016-05-29 NOTE — Progress Notes (Signed)
Psychiatric Initial Adult Assessment   Patient Identification: Yesenia Reynolds MRN:  [id number] Date of Evaluation:  05/29/2016 Referral Source: Dr. Abran Duke, oncologist at [facility] Chief Complaint:   Chief Complaint    Anxiety; Establish Care     Visit Diagnosis:    ICD-9-CM ICD-10-CM   1. Generalized anxiety disorder 300.02 F41.1     History of Present Illness:  This patient is a 75 year old single white female who lives with a female housemate in [place]. 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  The patient was referred by her oncologist for further treatment and assessment of severe anxiety.  The patient states that she has had no prior psychiatric assessment or treatment. She began to have endometrial bleeding in April 2016. She was found to have endometrial cancer and underwent hysterectomy. Following that she had 2 bouts of chemotherapy followed by radiation and eventually intravaginal radiation. She states that she finished all these treatments in October of 2016. She states that she did very well throughout the treatments and stayed calm and did not have significant problems with nausea and vomiting.  In November 2016 she began to have significant problems with anxiety. She didn't want anyone coming around and visiting her she felt extremely anxious being around people are going out to restaurants or to church. She is been a very gregarious person all her life and her personality totally changed. She denied being depressed sad or crying. She was shaking all the time and couldn't stay alone. She still can't stay alone. She stopped driving her car. She gave up things she enjoyed like going to her nephew's baseball games. She also could not watch anything frightening like police shows on TV because she would become extremely anxious. She is eating fairly well but lost a good deal of weight during her treatments. She is sleeping well and  denies nightmares. She states that she had short-term memory loss shortly after treatment but this is gotten much better.  She was tried on Paxil but it was not helpful. Her doctor has her on Xanax 0.25 mg which does help but she takes it as needed. Her gynecologist put her on Lamictal and she is up to 150 mg a day. She thinks this is helped more than anything else because she is less anxious than she was and is slowly starting to drive a little bit and be around a few people at a time. Her hand still shake at times. She would like to get out and do all the things she used to do such as drive on her own go to baseball games enjoy her friends and church but she is beginning to realize that this is going to be a slow process. She denies suicidal ideation or auditory or visual hallucinations  Associated Signs/Symptoms: Depression Symptoms:  psychomotor agitation, difficulty concentrating, panic attacks,  Anxiety Symptoms:  Agoraphobia, Excessive Worry, Panic Symptoms, Social Anxiety,   Past Psychiatric History: none  Previous Psychotropic Medications: Yes   Substance Abuse History in the last 12 months:  No.  Consequences of Substance Abuse: NA  Past Medical History:  Past Medical History  Diagnosis Date  . Mixed hyperlipidemia   . Hypothyroidism   . Essential hypertension, benign   . Arthritis   . Carotid artery disease (HCC)     Less than 50% ICA stenoses 6/13  . Type 2 diabetes mellitus ([place])   . GERD (gastroesophageal reflux disease)   .  Spinal stenosis   . Palpitations   . Cancer [facility])     Past Surgical History  Procedure Laterality Date  . Multiple dental extractions    . Lipoma excision    . Breast biopsy      benign  . Total knee arthroplasty  08/04/2012    Procedure: TOTAL KNEE ARTHROPLASTY;  Surgeon: Gearlean Alf, MD;  Location: [facility];  Service: Orthopedics;  Laterality: Left;  . Abdominal hysterectomy      Family Psychiatric History: none  Family  History:  Family History  Problem Relation Age of Onset  . Stroke Mother   . Heart failure Mother   . Hypertension Father   . Coronary artery disease Father     Social History:   Social History   Social History  . Marital Status: Single    Spouse Name: N/A  . Number of Children: N/A  . Years of Education: N/A   Social History Main Topics  . Smoking status: Never Smoker   . Smokeless tobacco: Never Used  . Alcohol Use: No     Comment: 05-29-16 per pt no  . Drug Use: No     Comment: 05-29-16 per pt no   . Sexual Activity: No   Other Topics Concern  . None   Social History Narrative    Additional Social History: The patient grew up in [place] with both parents and one younger sister. She states that she had an excellent childhood with no history of trauma or abuse. 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 She is never married and has lived with the same friend for 40 years. As a high school student and a younger person she was very active in sports such as softball and basketball and still very much enjoys watching the sports  Allergies:   Allergies  Allergen Reactions  . Morphine And Related Other (See Comments)    Hypotension  . Paxil [Paroxetine Hcl]     Metabolic Disorder Labs: No results found for: HGBA1C, MPG No results found for: PROLACTIN No results found for: CHOL, TRIG, HDL, CHOLHDL, VLDL, LDLCALC   Current Medications: Current Outpatient Prescriptions  Medication Sig Dispense Refill  . acetaminophen (TYLENOL) 500 MG tablet Take 500 mg by mouth 3 (three) times daily.    Marland Kitchen aspirin 81 MG tablet Take 81 mg by mouth daily.    Marland Kitchen atorvastatin (LIPITOR) 10 MG tablet Take 10 mg by mouth every evening.     . diclofenac sodium (VOLTAREN) 1 % GEL Place 1 application onto the skin as needed.    . fentaNYL (DURAGESIC - DOSED MCG/HR) 75 MCG/HR Place 75 mcg onto the skin every 3 (three) days.    Marland Kitchen ibuprofen  (ADVIL,MOTRIN) 200 MG tablet Take 200 mg by mouth every 6 (six) hours as needed.    . lamoTRIgine (LAMICTAL) 100 MG tablet Take 100 mg by mouth. Taking 0.5 Tablets in AM and 1 Tablet in PM    . latanoprost (XALATAN) 0.005 % ophthalmic solution Place 1 drop into both eyes at bedtime.    Marland Kitchen levothyroxine (SYNTHROID, LEVOTHROID) 88 MCG tablet Take 88 mcg by mouth daily before breakfast.     . lidocaine-prilocaine (EMLA) cream Apply 1 application topically as needed. With chemo    . lisinopril (PRINIVIL,ZESTRIL) 10 MG tablet Take 10 mg by mouth every morning.     . loratadine (CLARITIN) 10 MG tablet Take 10 mg by  mouth daily as needed for allergies.    . magnesium oxide (MAG-OX) 400 MG tablet Take 400 mg by mouth 2 (two) times daily.    . metoprolol succinate (TOPROL-XL) 25 MG 24 hr tablet TAKE 1/2 TABLET BY MOUTH EVERY MORNING 45 tablet 0  . ondansetron (ZOFRAN) 8 MG tablet Take 8 mg by mouth as needed.    . polyethylene glycol (MIRALAX / GLYCOLAX) packet Take 17 g by mouth daily.    . prochlorperazine (COMPAZINE) 10 MG tablet Take 10 mg by mouth as needed.    . ALPRAZolam (XANAX) 0.25 MG tablet Take 1 tablet (0.25 mg total) by mouth 4 (four) times daily. 120 tablet 2  . lamoTRIgine (LAMICTAL) 100 MG tablet Take one half in the am and one at bedtime 60 tablet 2   No current facility-administered medications for this visit.    Neurologic: Headache: No Seizure: No Paresthesias:No  Musculoskeletal: Strength & Muscle Tone: within normal limits Gait & Station: unsteady Patient leans: N/A  Psychiatric Specialty Exam: Review of Systems  Musculoskeletal: Positive for back pain.  Psychiatric/Behavioral: The patient is nervous/anxious.   All other systems reviewed and are negative.   Blood pressure 149/71, pulse 75, height 5\' 2"  (1.575 m), weight 147 lb (66.679 kg), SpO2 95 %.Body mass index is 26.88 kg/(m^2).  General Appearance: Casual, Neat and Well Groomed walking with a walker   Eye  Contact:  Good  Speech:  Clear and Coherent  Volume:  Normal  Mood:  Anxious  Affect:  Congruent  Thought Process:  Goal Directed  Orientation:  Full (Time, Place, and Person)  Thought Content:  Rumination  Suicidal Thoughts:  No  Homicidal Thoughts:  No  Memory:  Immediate;   Good Recent;   Good Remote;   Good  Judgement:  Fair  Insight:  Good  Psychomotor Activity:  Normal  Concentration:  Concentration: Good and Attention Span: Fair  Recall:  Good  Fund of Knowledge:Good  Language: Good  Akathisia:  No  Handed:  Right  AIMS (if indicated):    Assets:  Communication Skills Desire for Improvement Resilience Social Support Talents/Skills  ADL's:  Intact  Cognition: WNL  Sleep:  ok    Treatment Plan Summary: Medication management   This patient is a 75 year old female with no prior psychiatric history. She has got undergone treatment for uterine cancer and did very well with treatment but fell apart emotionally after all the treatments were over. The combination of Xanax and Lamictal have helped and she is on the road to recovery. I've encouraged her to slowly go back to the things she liked to do and small increments. She'll continue Lamictal 50mg  every morning and 100 mg daily at bedtime for mood stabilization and also Xanax 0.25 mg 4 times a day on a scheduled basis. She will start counseling here and return to see me in 6 weeks   [name], MD 6/22/20173:37 PM

## 2016-06-02 ENCOUNTER — Other Ambulatory Visit: Payer: Self-pay | Admitting: Cardiology

## 2016-06-24 DIAGNOSIS — E119 Type 2 diabetes mellitus without complications: Secondary | ICD-10-CM | POA: Diagnosis not present

## 2016-06-24 DIAGNOSIS — H2513 Age-related nuclear cataract, bilateral: Secondary | ICD-10-CM | POA: Diagnosis not present

## 2016-06-30 DIAGNOSIS — C55 Malignant neoplasm of uterus, part unspecified: Secondary | ICD-10-CM | POA: Diagnosis not present

## 2016-06-30 DIAGNOSIS — Z299 Encounter for prophylactic measures, unspecified: Secondary | ICD-10-CM | POA: Diagnosis not present

## 2016-06-30 DIAGNOSIS — M5440 Lumbago with sciatica, unspecified side: Secondary | ICD-10-CM | POA: Diagnosis not present

## 2016-06-30 DIAGNOSIS — I10 Essential (primary) hypertension: Secondary | ICD-10-CM | POA: Diagnosis not present

## 2016-07-01 ENCOUNTER — Encounter (HOSPITAL_COMMUNITY): Payer: Medicare Other | Attending: Hematology & Oncology

## 2016-07-01 ENCOUNTER — Encounter (HOSPITAL_COMMUNITY): Payer: Self-pay

## 2016-07-01 VITALS — BP 158/43 | HR 73 | Temp 98.9°F | Resp 20

## 2016-07-01 DIAGNOSIS — C541 Malignant neoplasm of endometrium: Secondary | ICD-10-CM | POA: Diagnosis not present

## 2016-07-01 DIAGNOSIS — Z95828 Presence of other vascular implants and grafts: Secondary | ICD-10-CM

## 2016-07-01 DIAGNOSIS — Z452 Encounter for adjustment and management of vascular access device: Secondary | ICD-10-CM

## 2016-07-01 MED ORDER — HEPARIN SOD (PORK) LOCK FLUSH 100 UNIT/ML IV SOLN
500.0000 [IU] | Freq: Once | INTRAVENOUS | Status: AC
  Administered 2016-07-01: 500 [IU] via INTRAVENOUS
  Filled 2016-07-01: qty 5

## 2016-07-01 MED ORDER — SODIUM CHLORIDE 0.9% FLUSH: 10.0000 mL | INTRAVENOUS | Status: DC | PRN

## 2016-07-01 NOTE — Patient Instructions (Signed)
[facility] at [facility] Discharge Instructions  RECOMMENDATIONS MADE BY THE CONSULTANT AND ANY TEST RESULTS WILL BE SENT TO YOUR REFERRING PHYSICIAN.   Port flush today Follow up as scheduled Please call the clinic if you have any questions or concerns     Thank you for choosing [facility] at [facility] to provide your oncology and hematology care.  To afford each patient quality time with our provider, please arrive at least 15 minutes before your scheduled appointment time.   Beginning January 23rd 2017 lab work for the Cancer Center will be done in the  Main lab at [facility] on 1st floor. If you have a lab appointment with the Cancer Center please come in thru the  Main Entrance and check in at the main information desk  You need to re-schedule your appointment should you arrive 10 or more minutes late.  We strive to give you quality time with our providers, and arriving late affects you and other patients whose appointments are after yours.  Also, if you no show three or more times for appointments you may be dismissed from the clinic at the providers discretion.     Again, thank you for choosing [facility].  Our hope is that these requests will decrease the amount of time that you wait before being seen by our physicians.       _____________________________________________________________  Should you have questions after your visit to [facility], please contact our office at [phone] between the hours of 8:30 a.m. and 4:30 p.m.  Voicemails left after 4:30 p.m. will not be returned until the following business day.  For prescription refill requests, have your pharmacy contact our office.         Resources For Cancer Patients and their Caregivers ? American Cancer Society: Can assist with transportation, wigs, general needs, runs Look Good Feel Better.        1-888-227-6333 ? Cancer  Care: Provides financial assistance, online support groups, medication/co-pay assistance.  1-800-813-HOPE (4673) ? [facility] Assists [place] cancer patients and their families through emotional , educational and financial support.  336-427-4357 ? [place] DSS Where to apply for food stamps, Medicaid and utility assistance. 336-342-1394 ? [facility]: Transportation to medical appointments. 336-347-2287 ? Social Security Administration: May apply for disability if have a Stage IV cancer. 336-342-7796 1-800-772-1213 ? [place] Aging, Disability and Transit Services: Assists with nutrition, care and transit needs. 336-349-2343  Cancer Center Support Programs: @[email or web address]@ > Cancer Support Group  2nd Tuesday of the month 1pm-2pm, Journey Room  > Creative Journey  3rd Tuesday of the month 1130am-1pm, Journey Room  > Look Good Feel Better  1st Wednesday of the month 10am-12 noon, Journey Room (Call American Cancer Society to register 1-800-395-5775)    

## 2016-07-01 NOTE — Progress Notes (Signed)
[name] presented for Portacath access and flush.  Proper placement of portacath confirmed by CXR.  Portacath located right chest wall accessed with  H 20 needle.  Good blood return present. Portacath flushed with 20ml NS and 500U/24ml Heparin and needle removed intact.  Procedure tolerated well and without incident.

## 2016-07-02 DIAGNOSIS — I10 Essential (primary) hypertension: Secondary | ICD-10-CM | POA: Diagnosis not present

## 2016-07-02 DIAGNOSIS — E7800 Pure hypercholesterolemia, unspecified: Secondary | ICD-10-CM | POA: Diagnosis not present

## 2016-07-08 ENCOUNTER — Telehealth (HOSPITAL_COMMUNITY): Payer: Self-pay | Admitting: *Deleted

## 2016-07-08 ENCOUNTER — Ambulatory Visit (HOSPITAL_COMMUNITY): Payer: Self-pay | Admitting: Psychiatry

## 2016-07-08 NOTE — Telephone Encounter (Signed)
Pt was scheduled to f/u with provider 07-08-16 due was resch due to provider being out of office. Pt need refills for her Xanax QID 05-29-2016 120 tabs 2 refills and Lamictal BID 60 tabs 2 refills. Pt pharmacy number is [phone]

## 2016-07-09 ENCOUNTER — Other Ambulatory Visit (HOSPITAL_COMMUNITY): Payer: Self-pay | Admitting: Psychiatry

## 2016-07-09 MED ORDER — LAMOTRIGINE 100 MG PO TABS: ORAL_TABLET | ORAL | 2 refills | Status: DC

## 2016-07-09 NOTE — Telephone Encounter (Signed)
lamictal sent, you may call in one month supply of xanax

## 2016-07-10 ENCOUNTER — Encounter (HOSPITAL_COMMUNITY): Payer: Self-pay | Admitting: Psychiatry

## 2016-07-10 ENCOUNTER — Ambulatory Visit (INDEPENDENT_AMBULATORY_CARE_PROVIDER_SITE_OTHER): Payer: Medicare Other | Admitting: Psychiatry

## 2016-07-10 DIAGNOSIS — F411 Generalized anxiety disorder: Secondary | ICD-10-CM

## 2016-07-11 NOTE — Progress Notes (Signed)
Comprehensive Clinical Assessment (CCA) Note  07/11/2016 Yesenia Reynolds [id number]  Visit Diagnosis:      ICD-9-CM ICD-10-CM   1. Generalized anxiety disorder 300.02 F41.1       CCA Part One  Part One has been completed on paper by the patient.  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 I completed treatment in October 2016. Now, I am afraid to stay alone, have the shakes, fearful of driving. I have a lot of anxiety. I used to drive all the time and drive long distances. I was never fearful of staying alone.  Patients Currently Reported Symptoms/Problems: nervousness, excessive worry, social withdrawal,  Type of Services Patient Feels Are Needed: Individual therapy Initial Clinical Notes/Concerns: Patient presents with symptoms of anxiety that began after she had treatment for cancer in 2016. Prior to treatment, patient reports being very independent and outgoing. After treatment, she began expereiencing significant anxiety and worry. She has a very supportive housemate of 35 years and patient expresses guilt about the effects of her condition on housemate. She also worries about the way this has affected her family.  Patient reports no previous involvement in therapy and denies any psychiatric hospitalizations.   Mental Health Symptoms Depression:  Depression: N/A  Mania:  Mania: N/A  Anxiety:   Anxiety: Tension, Worrying  Psychosis:  Psychosis: N/A  Trauma:  Trauma: N/A  Obsessions:  Obsessions: N/A  Compulsions:  Compulsions: N/A  Inattention:     Hyperactivity/Impulsivity:  Hyperactivity/Impulsivity: N/A  Oppositional/Defiant Behaviors:  Oppositional/Defiant Behaviors: N/A  Borderline Personality:  Emotional Irregularity: N/A  Other Mood/Personality  Symptoms:      Mental Status Exam Appearance and self-care  Stature:  Stature: Average  Weight:  Weight: Average weight  Clothing:  Clothing: Casual  Grooming:  Grooming: Normal  Cosmetic use:  Cosmetic Use: None  Posture/gait:  Posture/Gait: Other (Comment) (Patient walks with assistance of a walker.)  Motor activity:  Motor Activity: Tremor  Sensorium  Attention:  Attention: Normal  Concentration:  Concentration: Normal  Orientation:  Orientation: Object, Person, Place, Situation, Time  Recall/memory:  Recall/Memory: Defective in immediate  Affect and Mood  Affect:  Affect: Anxious  Mood:  Mood: Anxious  Relating  Eye contact:  Eye Contact: Normal  Facial expression:  Facial Expression: Responsive  Attitude toward examiner:  Attitude Toward Examiner: Cooperative  Thought and Language  Speech flow: Speech Flow: Normal  Thought content:  Thought Content: Appropriate to mood and circumstances  Preoccupation:  Preoccupations: Ruminations  Hallucinations:  Hallucinations: Other (Comment) (None)  Organization:  Landscape architect of Knowledge:  Fund of Knowledge: Average  Intelligence:  Intelligence: Average  Abstraction:  Abstraction: Normal  Judgement:  Judgement: Normal  Reality Testing:  Reality Testing: Realistic  Insight:  Insight: Good  Decision Making:  Decision Making: Normal  Social Functioning  Social Maturity:  Social Maturity: Isolates  Social Judgement:  Social Judgement: Normal  Stress  Stressors:  Stressors: Illness, Grief/losses  Coping Ability:  Coping Ability: English as a second language teacher Deficits:    Supports:  Friends, relatives   Family and Psychosocial History: Family history Marital status: Single Are you sexually active?: No Does patient have children?: No  Childhood History:  Childhood History By whom was/is the patient raised?: Both parents Additional childhood history  information: Patient was born and raised in [place].  Description  of patient's relationship with caregiver when they were a child: Patient reprots father was a Scientist, research (physical sciences) but didn't talk much. She was his little tomboy per patient's report. She reports relationship with mother was fine.  Patient's description of current relationship with people who raised him/her: Both are deceased.  How were you disciplined when you got in trouble as a child/adolescent?: occasional spankings Does patient have siblings?: Yes Number of Siblings: 1 Description of patient's current relationship with siblings: Patient reports she and sister aren't really friends but get along okay. Did patient suffer any verbal/emotional/physical/sexual abuse as a child?: No Did patient suffer from severe childhood neglect?: No Has patient ever been sexually abused/assaulted/raped as an adolescent or adult?: No Was the patient ever a victim of a crime or a disaster?: No Witnessed domestic violence?: No Has patient been effected by domestic violence as an adult?: No  CCA Part Two B  Employment/Work Situation: Employment / Work Situation Employment situation: Employed (Patient retired in 2007) What is the longest time patient has a held a job?: 29 years  Where was the patient employed at that time?: [employer]  ... worked in the lab Has patient ever been in the [employer]?: No Has patient ever served in combat?: No Did You Receive Any Psychiatric Treatment/Services While in Passenger transport manager?: No Are There Guns or Other Weapons in [place]?: Yes Types of Guns/Weapons: two rifles and a Careers information officer?:  (Patient says she doesn't know where the ammunition is.)  Education: Education Did Teacher, adult education From [employer]?: Yes Did Physicist, medical?:  (Patient reports taking several courses at [employer]) Did [place]?: No Did You Have Any Special Interests In School?: played basketball Did You Have An Individualized Education Program (IIEP): No Did You  Have Any Difficulty At [employer]?: No  Religion: Religion/Spirituality Are You A Religious Person?: Yes What is Your Religious Affiliation?: Baptist How Might This Affect Treatment?: no effect  Leisure/Recreation: Leisure / Recreation Leisure and Hobbies: used to like all sports, used to coach a team, used to bowl, can't perform these now but likes to watch basketball, baseball, and football on television  Exercise/Diet: Exercise/Diet Do You Exercise?: No Have You Gained or Lost A Significant Amount of Weight in the Past Six Months?: No Do You Follow a Special Diet?: No Do You Have Any Trouble Sleeping?: Yes Explanation of Sleeping Difficulties: difficulty staying asleep, sleeps about 4-5 hours  CCA Part Two C  Alcohol/Drug Use: Alcohol / Drug Use History of alcohol / drug use?: No history of alcohol / drug abuse    CCA Part Three  ASAM's:  Six Dimensions of Multidimensional Assessment N/A  Substance use Disorder (SUD)  N/A    Social Function:  Social Functioning Social Maturity: Isolates Social Judgement: Normal  Stress:  Stress Stressors: Illness, Grief/losses Coping Ability: Overwhelmed Patient Takes Medications The Way The Doctor Instructed?: Yes Priority Risk: Moderate Risk  Risk Assessment- Self-Harm Potential: Risk Assessment For Self-Harm Potential Thoughts of Self-Harm: No current thoughts  Risk Assessment -Dangerous to Others Potential: Risk Assessment For Dangerous to Others Potential Method: No Plan Notification Required: No need or identified person  DSM5 Diagnoses: Patient Active Problem List   Diagnosis Date Noted  . Generalized anxiety disorder 05/29/2016  . Endometrial cancer, FIGO stage IIIC ([place]) 08/08/2015  . Essential hypertension, benign 07/05/2012  . Mixed hyperlipidemia 07/05/2012  . Palpitations 07/05/2012  . Carotid  artery disease ([place]) 07/05/2012    Patient Centered Plan: Patient is on the following Treatment Plan(s):  Generalized anxiety disorder  Recommendations for Services/Supports/Treatments: Patient attends the assessment appointment today. Confidentiality limits were discussed. The patient agrees return for an appointment in 2 weeks for continuing assessment and treatment planning. Patient will continue to see psychiatrist Dr. Harrington Challenger for medication management. Patient agrees to call this practice, call 911, or have someone take her to the ER should symptoms worsen. Individual therapy is recommended 1 time every 1-2 weeks to learn and implement calming strategies to manage overall anxiety and to process feelings regarding changed functioning.    Treatment Plan Summary:    Referrals to Alternative Service(s): Referred to Alternative Service(s):   Place:   Date:   Time:    Referred to Alternative Service(s):   Place:   Date:   Time:    Referred to Alternative Service(s):   Place:   Date:   Time:    Referred to Alternative Service(s):   Place:   Date:   Time:     Kaniesha Barile

## 2016-07-14 ENCOUNTER — Telehealth (HOSPITAL_COMMUNITY): Payer: Self-pay | Admitting: *Deleted

## 2016-07-14 MED ORDER — ALPRAZOLAM 0.25 MG PO TABS: 0.2500 mg | ORAL_TABLET | Freq: Four times a day (QID) | ORAL | 0 refills | Status: DC

## 2016-07-14 NOTE — Telephone Encounter (Signed)
Per Dr. Harrington Challenger to call in refill for pt Xanax. Spoke with [employer]

## 2016-07-18 DIAGNOSIS — E1165 Type 2 diabetes mellitus with hyperglycemia: Secondary | ICD-10-CM | POA: Diagnosis not present

## 2016-07-18 DIAGNOSIS — Z1389 Encounter for screening for other disorder: Secondary | ICD-10-CM | POA: Diagnosis not present

## 2016-07-18 DIAGNOSIS — Z0000 Encounter for general adult medical examination without abnormal findings: Secondary | ICD-10-CM | POA: Diagnosis not present

## 2016-07-18 DIAGNOSIS — Z299 Encounter for prophylactic measures, unspecified: Secondary | ICD-10-CM | POA: Diagnosis not present

## 2016-07-18 DIAGNOSIS — Z1211 Encounter for screening for malignant neoplasm of colon: Secondary | ICD-10-CM | POA: Diagnosis not present

## 2016-07-18 DIAGNOSIS — Z7189 Other specified counseling: Secondary | ICD-10-CM | POA: Diagnosis not present

## 2016-07-21 DIAGNOSIS — E7800 Pure hypercholesterolemia, unspecified: Secondary | ICD-10-CM | POA: Diagnosis not present

## 2016-07-21 DIAGNOSIS — E559 Vitamin D deficiency, unspecified: Secondary | ICD-10-CM | POA: Diagnosis not present

## 2016-07-21 DIAGNOSIS — I10 Essential (primary) hypertension: Secondary | ICD-10-CM | POA: Diagnosis not present

## 2016-07-21 DIAGNOSIS — E039 Hypothyroidism, unspecified: Secondary | ICD-10-CM | POA: Diagnosis not present

## 2016-07-21 DIAGNOSIS — Z79899 Other long term (current) drug therapy: Secondary | ICD-10-CM | POA: Diagnosis not present

## 2016-07-30 ENCOUNTER — Encounter (HOSPITAL_COMMUNITY): Payer: Self-pay | Admitting: Psychiatry

## 2016-07-30 ENCOUNTER — Ambulatory Visit (INDEPENDENT_AMBULATORY_CARE_PROVIDER_SITE_OTHER): Payer: Medicare Other | Admitting: Psychiatry

## 2016-07-30 DIAGNOSIS — F411 Generalized anxiety disorder: Secondary | ICD-10-CM | POA: Diagnosis not present

## 2016-07-31 NOTE — Progress Notes (Signed)
  Patient:  Yesenia Reynolds   DOB: 1941-04-21  MR Number: [id number]  Location: [facility] [place]:  [address] [place],  [place], [address]  Start: Wednesday 07/30/2016 11:10 AM  End: Wednesday 07/30/2016 12:00 PM  Provider/Observer:     Maurice Small, MSW, LCSW   Chief Complaint:      Chief Complaint  Patient presents with  . Anxiety    Reason For Service:     Yesenia Reynolds is a 75 year old female who presents with symptoms of anxiety that began after she had treatment for cancer in 2016. Prior to treatment, patient reports being very independent and outgoing. After treatment, she became fearful of staying alone, driving, and being away from home. She reports excessive worry and says she developed shakiness. She also reports nervousness and decreased social involvement.  Interventions Strategy:  Supportive  Participation Level:   Active  Participation Quality:  Appropriate      Behavioral Observation:  Casual, Alert, and Anxious   Current Psychosocial Factors: Decreased independence as patient is fearful of driving, concerns and guilt about the effects of her condition on her housemate and her family  Content of Session:   Established report, reviewed symptoms, facilitated expression of feelings, discussed rationale for and practiced controlled breathing, assigned patient to practice controlled breathing daily  Current Status:   Anxiety, excessive worry, muscle tension, social withdrawal  Suicidal/Homicidal: No  Patient Progress:   Patient reports no change in symptoms since assessment session. She reports continued nervousness and states she was nervous about coming to her appointment. She expresses frustration as she has been dizzy in recent days. She says she did drive about a mile a couple of times in the past month but has not driven any since experiencing dizziness. She continues to worry about her health.  Target Goals:   1. Establish rapport 2. Learn and  implement calming skills to reduce/manage overall symptoms of anxiety  Last Reviewed:     Goals Addressed Today:    1,2  Plan:                   Return again in 2 weeks. Patient agrees to practice controlled breathing 5-10 minutes 2 times per day.  Impression/Diagnosis:  Patient presents with symptoms of anxiety that began after she had treatment for cancer in 2016. She reports excessive worry, motor tension, and social withdrawal.       Diagnosis:  Axis I: Generalized anxiety disorder          Axis II: Deferred   Taris Galindo, LCSW 07/31/2016

## 2016-08-07 ENCOUNTER — Encounter (HOSPITAL_COMMUNITY): Payer: Medicare Other | Attending: Hematology & Oncology | Admitting: Oncology

## 2016-08-07 ENCOUNTER — Encounter (HOSPITAL_COMMUNITY): Payer: Self-pay | Admitting: Oncology

## 2016-08-07 ENCOUNTER — Encounter (HOSPITAL_COMMUNITY): Payer: Self-pay | Admitting: Lab

## 2016-08-07 VITALS — BP 168/63 | HR 73 | Temp 98.6°F | Resp 18 | Ht 62.0 in | Wt 143.3 lb

## 2016-08-07 DIAGNOSIS — M4806 Spinal stenosis, lumbar region: Secondary | ICD-10-CM | POA: Diagnosis not present

## 2016-08-07 DIAGNOSIS — G8929 Other chronic pain: Secondary | ICD-10-CM

## 2016-08-07 DIAGNOSIS — F4001 Agoraphobia with panic disorder: Secondary | ICD-10-CM

## 2016-08-07 DIAGNOSIS — Z800 Family history of malignant neoplasm of digestive organs: Secondary | ICD-10-CM

## 2016-08-07 DIAGNOSIS — M48061 Spinal stenosis, lumbar region without neurogenic claudication: Secondary | ICD-10-CM

## 2016-08-07 DIAGNOSIS — C541 Malignant neoplasm of endometrium: Secondary | ICD-10-CM | POA: Insufficient documentation

## 2016-08-07 DIAGNOSIS — Z95828 Presence of other vascular implants and grafts: Secondary | ICD-10-CM

## 2016-08-07 HISTORY — DX: Agoraphobia with panic disorder: F40.01

## 2016-08-07 HISTORY — DX: Spinal stenosis, lumbar region without neurogenic claudication: M48.061

## 2016-08-07 LAB — CBC WITH DIFFERENTIAL/PLATELET
BASOS PCT: 0 %
Basophils Absolute: 0 10*3/uL (ref 0.0–0.1)
Eosinophils Absolute: 0.1 10*3/uL (ref 0.0–0.7)
Eosinophils Relative: 3 %
HEMATOCRIT: 35.8 % — AB (ref 36.0–46.0)
HEMOGLOBIN: 11.6 g/dL — AB (ref 12.0–15.0)
Lymphocytes Relative: 20 %
Lymphs Abs: 0.8 10*3/uL (ref 0.7–4.0)
MCH: 29.4 pg (ref 26.0–34.0)
MCHC: 32.4 g/dL (ref 30.0–36.0)
MCV: 90.9 fL (ref 78.0–100.0)
MONOS PCT: 11 %
Monocytes Absolute: 0.4 10*3/uL (ref 0.1–1.0)
NEUTROS ABS: 2.6 10*3/uL (ref 1.7–7.7)
NEUTROS PCT: 66 %
Platelets: 187 10*3/uL (ref 150–400)
RBC: 3.94 MIL/uL (ref 3.87–5.11)
RDW: 13.1 % (ref 11.5–15.5)
WBC: 3.9 10*3/uL — AB (ref 4.0–10.5)

## 2016-08-07 LAB — COMPREHENSIVE METABOLIC PANEL
ALBUMIN: 4 g/dL (ref 3.5–5.0)
ALK PHOS: 101 U/L (ref 38–126)
ALT: 16 U/L (ref 14–54)
ANION GAP: 7 (ref 5–15)
AST: 17 U/L (ref 15–41)
BILIRUBIN TOTAL: 0.4 mg/dL (ref 0.3–1.2)
BUN: 14 mg/dL (ref 6–20)
CALCIUM: 8.8 mg/dL — AB (ref 8.9–10.3)
CO2: 28 mmol/L (ref 22–32)
CREATININE: 0.7 mg/dL (ref 0.44–1.00)
Chloride: 101 mmol/L (ref 101–111)
GFR calc Af Amer: >60 mL/min (ref >60)
GFR calc non Af Amer: >60 mL/min (ref >60)
GLUCOSE: 95 mg/dL (ref 65–99)
Potassium: 3.6 mmol/L (ref 3.5–5.1)
Sodium: 136 mmol/L (ref 135–145)
TOTAL PROTEIN: 6.6 g/dL (ref 6.5–8.1)

## 2016-08-07 MED ORDER — HEPARIN SOD (PORK) LOCK FLUSH 100 UNIT/ML IV SOLN
INTRAVENOUS | Status: AC
  Filled 2016-08-07: qty 5

## 2016-08-07 MED ORDER — FENTANYL 75 MCG/HR TD PT72: 75.0000 ug | MEDICATED_PATCH | TRANSDERMAL | 0 refills | Status: DC

## 2016-08-07 NOTE — Patient Instructions (Signed)
[place] at [facility] Discharge Instructions  RECOMMENDATIONS MADE BY THE CONSULTANT AND ANY TEST RESULTS WILL BE SENT TO YOUR REFERRING PHYSICIAN.  You were seen by Gershon Mussel today. Fentanyl refilled Follow up with Dr. Harrington Challenger Follow up with Dr. Barrie Dunker for vaginal exams Port flushes every 6-8 weeks  Follow up in 3 months with labs  Referral to Dr. Carloyn Manner for back pain  Referral to Survivorship Program  Thank you for choosing [place] at [facility] to provide your oncology and hematology care.  To afford each patient quality time with our provider, please arrive at least 15 minutes before your scheduled appointment time.   Beginning January 23rd 2017 lab work for the [facility] will be done in the  Main lab at [facility] on 1st floor. If you have a lab appointment with the [facility] please come in thru the  Main Entrance and check in at the main information desk  You need to re-schedule your appointment should you arrive 10 or more minutes late.  We strive to give you quality time with our providers, and arriving late affects you and other patients whose appointments are after yours.  Also, if you no show three or more times for appointments you may be dismissed from the clinic at the providers discretion.     Again, thank you for choosing [facility].  Our hope is that these requests will decrease the amount of time that you wait before being seen by our physicians.       _____________________________________________________________  Should you have questions after your visit to [facility], please contact our office at [phone] between the hours of 8:30 a.m. and 4:30 p.m.  Voicemails left after 4:30 p.m. will not be returned until the following business day.  For prescription refill requests, have your pharmacy contact our office.         Resources For Cancer Patients and their Caregivers ? American  Cancer Society: Can assist with transportation, wigs, general needs, runs Look Good Feel Better.        (857)118-8791 ? Cancer Care: Provides financial assistance, online support groups, medication/co-pay assistance.  1-800-813-HOPE 804-342-9299) ? [facility] Assists [place] cancer patients and their families through emotional , educational and financial support.  (431)277-2843 ? [place] DSS Where to apply for food stamps, Medicaid and utility assistance. (613) 417-9547 ? [facility]: Transportation to medical appointments. 431-584-2909 ? Social Security Administration: May apply for disability if have a Stage IV cancer. (458)006-4059 413-613-2516 ? [employer], Disability and Transit Services: Assists with nutrition, care and transit needs. [place] Support Programs: @10RELATIVEDAYS @ > Cancer Support Group  2nd Tuesday of the month 1pm-2pm, Journey Room  > Creative Journey  3rd Tuesday of the month 1130am-1pm, Journey Room  > Look Good Feel Better  1st Wednesday of the month 10am-12 noon, Journey Room (Call [place] to register [phone])

## 2016-08-07 NOTE — Progress Notes (Unsigned)
Referral sent to Dr Carloyn Manner.  Records sent on 8/31.  They will call patient with appt.

## 2016-08-07 NOTE — Assessment & Plan Note (Addendum)
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  More appropriately managed by Dr. Harrington Challenger after initially being managed by Dr. Tressie Stalker (Medical Oncology) with significant amount of anti-anxiolytics, namely Xanax.  Patient is provided education on more appropriate management of this issue and she is informed that reliance on Xanax is inappropriate for this issue.  Unfortunately, due to past treatment, the patient has become reliant on Xanax and it is noted that Dr. Harrington Challenger is working on decreasing the patient's Xanax needs.  Patient is strongly urged to continue follow-up with Dr. Harrington Challenger as directed.  Patient is educated that we will not prescribe/refill anti-anxiolytics (Xanax).  CSW is notified of this patient and requested to make contact with her.

## 2016-08-07 NOTE — Assessment & Plan Note (Addendum)
Severe spinal stenosis at multiple levels.  Previously seen by Dr. Carloyn Manner. Now on Fentanyl 75 mch/hr by Dr. Tressie Stalker for non-oncologic pain.  I personally reviewed and went over radiographic studies with the patient.  The results are noted within this dictation.    Will refer back to Dr. Carloyn Manner for further management of spinal stenosis.  Refill on Fentanyl provided.    Long-term pain management for spinal stenosis will be transferred to more appropriate provider in future based upon Dr. Rex Kras recommendations.

## 2016-08-07 NOTE — Progress Notes (Signed)
[facility] Hematology/Oncology Consultation   Name: Yesenia Reynolds      MRN: [id number]    Date: 08/08/2016 Time:3:57 PM   REFERRING PHYSICIAN:  Everardo All, MD (Medical Oncology at [facility])  [place]:  Transfer of medical oncology care.   DIAGNOSIS:  Stage IIIC1, Grade 2, endometrial cancer, treated at [facility] under the care of Dr. Tressie Stalker.  S/P systemic chemotherapy consisting Carboplatin/Paclitaxel x 3 cycles followed by EBRT (06/27/2015- 07/31/2015), then 3 more cycles of chemotherapy (04/26/2015- 10/03/2015) with vaginal cuff intracavitary brachytherapy (08/08/2015, 08/14/2015, and 08/21/2015).    Endometrial cancer ([place])   02/16/2015 Initial Diagnosis    Endometrial cancer ([place]).  The endometrial cancer found on D&C, grade 2 by Dr. Barrie Dunker.      03/30/2015 Surgery    Robotic hysterectomy, bilateral salpingo-oophorectomy, sentinel lymph node mapping and resection, mini-laparotomy by Dr. Clenton Pare.      04/02/2015 Cancer Staging    Stage IIIc 1, grade 2      04/26/2015 - 10/03/2015 Chemotherapy    The patient had palonosetron (ALOXI) injection 0.25 mg, 0.25 mg, Intravenous,  Once, 6 of 6 cycles  CARBOplatin (PARAPLATIN) in sodium chloride 0.9 % 100 mL chemo infusion, , Intravenous,  Once, 6 of 6 cycles  PACLitaxel (TAXOL) 294 mg in dextrose 5 % 250 mL chemo infusion (> 80mg /m2), 175 mg/m2 = 294 mg, Intravenous,  Once, 6 of 6 cycles  for chemotherapy treatment.        06/27/2015 - 07/31/2015 Radiation Therapy    External beam radiation therapy      08/08/2015 - 08/21/2015 Radiation Therapy    Vaginal cuff intracavitary brachytherapy delivered, August 31, September 6, 08/21/2015. 18 gray in 3 fractions.      02/20/2016 Imaging    CT Abd/Pelvis- no evidence of metastatic disease status post hysterectomy. Improving fluid collection along the right pelvic sidewall, probably postoperative lymphocele or seroma.       HISTORY OF PRESENT ILLNESS:     Yesenia Reynolds is a 75 y.o. female with a medical history significant for endometrial cancer, agoraphobia, severe spinal stenosis, CAD, HTN, GAD, hyperlipidemia who is referred to the [facility] for transfer of ongoing medical oncology care for Stage IIIC1, Grade 2, endometrial cancer, treated at [facility] under the care of Dr. Tressie Stalker.  S/P systemic chemotherapy consisting Carboplatin/Paclitaxel x 3 cycles followed by EBRT (06/27/2015- 07/31/2015), then 3 more cycles of chemotherapy (04/26/2015- 10/03/2015) with vaginal cuff intracavitary brachytherapy (08/08/2015, 08/14/2015, and 08/21/2015).  It is reported that prior to her diagnosis she was a very independent, strong person.  However, since her diagnosis and treatment, she does not leave her house and cannot be alone.  If either of these occur, she has a "panic attack."  She has been managed in the past by Dr. Tressie Stalker who was prescribing the patient #180 of Xanax.  Fortunately, she is now appropriately established with Dr. Harrington Challenger for treatment of this issue.  Additionally, she has back pain that improves with ambulation.  Her back pain is typically worse in the AM upon awakening and improves with ambulation and movement.  This was evaluated by Dr. Carloyn Manner in the past with MRI of spine.  I personally reviewed and went over radiographic studies with the patient.  The results are noted within this dictation.  This too has been managed by Dr. Tressie Stalker in the past with Fentanyl 75 mcg.  During discussion, the patient is concerned about maintaining her  Xanax and Fentanyl Rxs. She initially mentions she may not want to return to see Dr. Harrington Challenger. She does admit that she would like to get back to doing things/activities prior to her cancer diagnosis.  She denies any B symptoms, weight loss, vaginal pain/bleeding, etc.    Review of Systems  Constitutional: Negative for chills, fever and weight loss.  HENT: Negative.   Eyes: Negative.  Negative for blurred  vision, double vision and photophobia.  Respiratory: Negative.   Cardiovascular: Negative.  Negative for chest pain.  Gastrointestinal: Negative.  Negative for abdominal pain, constipation, diarrhea, nausea and vomiting.  Genitourinary: Negative.  Negative for dysuria and urgency.  Musculoskeletal: Negative.  Negative for back pain, falls, joint pain, myalgias and neck pain.  Skin: Negative.  Negative for rash.  Neurological: Negative.  Negative for dizziness, tingling, tremors, weakness and headaches.  Endo/Heme/Allergies: Negative.   Psychiatric/Behavioral: Positive for depression. The patient is nervous/anxious.   14 point review of systems was performed and is negative except as detailed under history of present illness and above   PAST MEDICAL HISTORY:   Past Medical History:  Diagnosis Date  . Agoraphobia with panic attacks 08/07/2016  . Arthritis   . Cancer ([place])   . Carotid artery disease (H[facility])    Less than 50% ICA stenoses 6/13  . Endometrial cancer, FIGO stage IIIC ([place]) 08/08/2015  . Essential hypertension, benign   . GERD (gastroesophageal reflux disease)   . Hypothyroidism   . Mixed hyperlipidemia   . Palpitations   . Spinal stenosis   . Spinal stenosis of lumbar region at multiple levels 08/07/2016  . Type 2 diabetes mellitus (HCC)     ALLERGIES: Allergies  Allergen Reactions  . Morphine And Related Other (See Comments)    Hypotension  . Paxil [Paroxetine Hcl]       MEDICATIONS: I have reviewed the patient's current medications.    Current Outpatient Prescriptions on File Prior to Visit  Medication Sig Dispense Refill  . aspirin 81 MG tablet Take 81 mg by mouth daily.    Marland Kitchen atorvastatin (LIPITOR) 10 MG tablet Take 10 mg by mouth every evening.     Marland Kitchen ibuprofen (ADVIL,MOTRIN) 200 MG tablet Take 200 mg by mouth every 6 (six) hours as needed.    . latanoprost (XALATAN) 0.005 % ophthalmic solution Place 1 drop into both eyes at bedtime.    Marland Kitchen levothyroxine  (SYNTHROID, LEVOTHROID) 88 MCG tablet Take 88 mcg by mouth daily before breakfast.     . lidocaine-prilocaine (EMLA) cream Apply 1 application topically as needed. With chemo    . lisinopril (PRINIVIL,ZESTRIL) 10 MG tablet Take 10 mg by mouth every morning.     . loratadine (CLARITIN) 10 MG tablet Take 10 mg by mouth daily as needed for allergies.    . magnesium oxide (MAG-OX) 400 MG tablet Take 400 mg by mouth 2 (two) times daily.    . metoprolol succinate (TOPROL-XL) 25 MG 24 hr tablet TAKE 1/2 TABLET BY MOUTH EVERY MORNING (PATIENT NEEDS OFFICE VISIT PER MD) 45 tablet 3  . ondansetron (ZOFRAN) 8 MG tablet Take 8 mg by mouth as needed.    . polyethylene glycol (MIRALAX / GLYCOLAX) packet Take 17 g by mouth daily.    . prochlorperazine (COMPAZINE) 10 MG tablet Take 10 mg by mouth as needed.     No current facility-administered medications on file prior to visit.      PAST SURGICAL HISTORY Past Surgical History:  Procedure Laterality Date  .  ABDOMINAL HYSTERECTOMY    . BREAST BIOPSY     benign  . LIPOMA EXCISION    . Multiple dental extractions    . TONSILECTOMY, ADENOIDECTOMY, BILATERAL MYRINGOTOMY AND TUBES    . TOTAL KNEE ARTHROPLASTY  08/04/2012   Procedure: TOTAL KNEE ARTHROPLASTY;  Surgeon: Gearlean Alf, MD;  Location: [facility];  Service: Orthopedics;  Laterality: Left;    FAMILY HISTORY: Family History  Problem Relation Age of Onset  . Stroke Mother   . Heart failure Mother   . Hypertension Father   . Coronary artery disease Father   Maternal uncle deceased of colon cancer, age of diagnosis unknown but felt to be greater than 2 2. Maternal aunt deceased of stomach cancer, age of diagnosis unknown but again felt to be greater than 60 at diagnosis.   SOCIAL HISTORY:  reports that she has never smoked. She has never used smokeless tobacco. She reports that she does not drink alcohol or use drugs.  Social History   Social History  . Marital status: Single    Spouse  name: N/A  . Number of children: N/A  . Years of education: N/A   Social History Main Topics  . Smoking status: Never Smoker  . Smokeless tobacco: Never Used  . Alcohol use No     Comment: 07/10/2016 per pt no  . Drug use: No     Comment: 8/3/2017per pt no   . Sexual activity: No   Other Topics Concern  . None   Social History Narrative  . None  The patient grew up in [place] with both parents and one younger sister. She states that she had an excellent childhood with no history of trauma or abuse. 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 She is never married and has lived with the same friend for 40 years. As a high school student and a younger person she was very active in sports such as softball and basketball and still very much enjoys watching the sports  PERFORMANCE STATUS: The patient's performance status is 1 - Symptomatic but completely ambulatory  PHYSICAL EXAM: Most Recent Vital Signs: Blood pressure (!) 168/63, pulse 73, temperature 98.6 F (37 C), temperature source Oral, resp. rate 18, height 5\' 2"  (1.575 m), weight 143 lb 4.8 oz (65 kg), SpO2 100 %. General appearance: alert, cooperative, appears stated age, no distress and anxious, accompanied by her friend/roomate/significant other Marcie Bal Head: Normocephalic, without obvious abnormality, atraumatic Eyes: negative findings: lids and lashes normal, conjunctivae and sclerae normal and corneas clear Throat: normal findings: lips normal without lesions and oropharynx pink & moist without lesions or evidence of thrush Neck: no adenopathy and supple, symmetrical, trachea midline Lungs: clear to auscultation bilaterally and normal percussion bilaterally Heart: regular rate and rhythm, S1, S2 normal, no murmur, click, rub or gallop Abdomen: soft, non-tender; bowel sounds normal; no masses,  no organomegaly Extremities: extremities normal, atraumatic, no cyanosis or  edema Skin: Skin color, texture, turgor normal. No rashes or lesions Lymph nodes: Cervical, supraclavicular, and axillary nodes normal. Neurologic: Grossly normal  LABORATORY DATA:  CBC    Component Value Date/Time   WBC 3.9 (L) 08/07/2016 1330   RBC 3.94 08/07/2016 1330   HGB 11.6 (L) 08/07/2016 1330   HCT 35.8 (L) 08/07/2016 1330   PLT 187 08/07/2016 1330   MCV 90.9 08/07/2016 1330   MCH 29.4 08/07/2016 1330   MCHC 32.4 08/07/2016 1330  RDW 13.1 08/07/2016 1330   LYMPHSABS 0.8 08/07/2016 1330   MONOABS 0.4 08/07/2016 1330   EOSABS 0.1 08/07/2016 1330   BASOSABS 0.0 08/07/2016 1330     Chemistry      Component Value Date/Time   NA 136 08/07/2016 1330   K 3.6 08/07/2016 1330   CL 101 08/07/2016 1330   CO2 28 08/07/2016 1330   BUN 14 08/07/2016 1330   CREATININE 0.70 08/07/2016 1330      Component Value Date/Time   CALCIUM 8.8 (L) 08/07/2016 1330   ALKPHOS 101 08/07/2016 1330   AST 17 08/07/2016 1330   ALT 16 08/07/2016 1330   BILITOT 0.4 08/07/2016 1330     Lab Results  Component Value Date   CA125 10.0 08/07/2016    RADIOGRAPHY: No results found.          PATHOLOGY:  N/A   ASSESSMENT/PLAN:   Endometrial cancer  Stage IIIC1, Grade 2, endometrial cancer, treated at [facility] under the care of Dr. Tressie Stalker.  S/P systemic chemotherapy consisting Carboplatin/Paclitaxel x 3 cycles followed by EBRT (06/27/2015- 07/31/2015), then 3 more cycles of chemotherapy (04/26/2015- 10/03/2015) with vaginal cuff intracavitary brachytherapy (08/08/2015, 08/14/2015, and 08/21/2015).  Oncology history developed.  Labs today and in 6-8 weeks: CBC diff, CMET, CA 125.   Port flush today.  Port flush every 6-8 weeks.  Return in 3 months for labs: CBC diff, CMET, CA 125.  Refer to Survivorship Program.  Return in 3 months for follow-up.  NCCN guidelines pertaining to surveillance of endometrial cancer illustrates the following surveillance guidelines (2.2017):  A. Physical exam  every 3-6 months for 2-3 years and then every 6 months or annually.  B. CA-125 (optional) if elevated initially.  C. Imaging as clinically indicated  D. Consider genetic counseling/testing for patients (<45 years old) and those with a significant family history of endometrial and/or colorectal cancer and/or selected pathologic risk features.  E. Patient education regarding symptoms of potential recurrence, lifestyle, obesity, exercise, and nutrition.  F. Patient education regarding sexual health, vaginal dilator use, and vaginal lubricants/moisturizers.   Agoraphobia with panic attacks Agoraphobia with panic attacks, suspected to be secondary to malignancy diagnosis and treatment based upon history. Following with Dr. Harrington Challenger, she was encouraged today to continue with Dr. Harrington Challenger.  Patient is educated that we will not prescribe/refill anti-anxiolytics (Xanax).  CSW is notified of this patient and requested to make contact with her.  I have encouraged the patient to consider attending support groups, art therapy, etc. I think that these would be very beneficial for her.   Spinal stenosis of lumbar region at multiple levels Chronic Pain Severe spinal stenosis at multiple levels.  Previously seen by Dr. Carloyn Manner. Now on Fentanyl 75 mch/hr by Dr. Tressie Stalker for non-oncologic pain. Refill Fentanyl provided today.  She is taking 75 mcg/hr for her severe spinal stenosis.  In the future, we will need to discuss transferring her pain management to a more appropriate provider for non-oncology related pain.  Will refer back to Dr. Carloyn Manner for further management of spinal stenosis.  Long-term pain management for spinal stenosis will be transferred to more appropriate provider in future based upon Dr. Rex Kras recommendations.   ORDERS PLACED FOR THIS ENCOUNTER: Orders Placed This Encounter  Procedures  . CBC with Differential  . Comprehensive metabolic panel  . CA 125  . CBC with Differential  . Comprehensive  metabolic panel  . CA 125    MEDICATIONS PRESCRIBED THIS ENCOUNTER: Meds ordered this encounter  Medications  .  fentaNYL (DURAGESIC - DOSED MCG/HR) 75 MCG/HR    Sig: Place 1 patch (75 mcg total) onto the skin every 3 (three) days.    Dispense:  10 patch    Refill:  0    Order Specific Question:   Supervising Provider    Answer:   Patrici Ranks [id number]    All questions were answered. The patient knows to call the clinic with any problems, questions or concerns. We can certainly see the patient much sooner if necessary.  This note is electronically signed [id number] Cyril Mourning, MD  08/08/2016 3:57 PM

## 2016-08-07 NOTE — Assessment & Plan Note (Addendum)
Stage IIIC1, Grade 2, endometrial cancer, treated at [facility] under the care of Dr. Tressie Stalker.  S/P systemic chemotherapy consisting Carboplatin/Paclitaxel x 3 cycles followed by EBRT (06/27/2015- 07/31/2015), then 3 more cycles of chemotherapy (04/26/2015- 10/03/2015) with vaginal cuff intracavitary brachytherapy (08/08/2015, 08/14/2015, and 08/21/2015).  Oncology history developed.  Labs today and in 6-8 weeks: CBC diff, CMET, CA 125.  I personally reviewed and went over laboratory results with the patient.  The results are noted within this dictation.  Port flush today.  Port flush every 6-8 weeks.  Return in 3 months for labs: CBC diff, CMET, CA 125.  Refill Fentanyl provided today.  She is taking 75 mcg/hr for her severe spinal stenosis.  In the future, we will need to discuss transferring her pain management to a more appropriate provider for non-oncology related pain.  Refer to Survivorship Program.  Return in 3 months for follow-up.   NCCN guidelines pertaining to surveillance of endometrial cancer illustrates the following surveillance guidelines (2.2017):  A. Physical exam every 3-6 months for 2-3 years and then every 6 months or annually.  B. CA-125 (optional) if elevated initially.  C. Imaging as clinically indicated  D. Consider genetic counseling/testing for patients (<18 years old) and those with a significant family history of endometrial and/or colorectal cancer and/or selected pathologic risk features.  E. Patient education regarding symptoms of potential recurrence, lifestyle, obesity, exercise, and nutrition.  F. Patient education regarding sexual health, vaginal dilator use, and vaginal lubricants/moisturizers.

## 2016-08-08 ENCOUNTER — Encounter (HOSPITAL_COMMUNITY): Payer: Self-pay | Admitting: Psychiatry

## 2016-08-08 ENCOUNTER — Ambulatory Visit (INDEPENDENT_AMBULATORY_CARE_PROVIDER_SITE_OTHER): Payer: Medicare Other | Admitting: Psychiatry

## 2016-08-08 VITALS — BP 161/77 | HR 82 | Ht 62.0 in | Wt 143.8 lb

## 2016-08-08 DIAGNOSIS — F411 Generalized anxiety disorder: Secondary | ICD-10-CM

## 2016-08-08 DIAGNOSIS — I6523 Occlusion and stenosis of bilateral carotid arteries: Secondary | ICD-10-CM | POA: Diagnosis not present

## 2016-08-08 LAB — CA 125: CA 125: 10 U/mL (ref 0.0–38.1)

## 2016-08-08 MED ORDER — ALPRAZOLAM 0.5 MG PO TABS: 0.5000 mg | ORAL_TABLET | Freq: Four times a day (QID) | ORAL | 2 refills | Status: DC

## 2016-08-08 MED ORDER — LAMOTRIGINE 100 MG PO TABS: ORAL_TABLET | ORAL | 2 refills | Status: DC

## 2016-08-08 MED ORDER — ALPRAZOLAM 0.25 MG PO TABS: 0.2500 mg | ORAL_TABLET | Freq: Four times a day (QID) | ORAL | 2 refills | Status: DC

## 2016-08-08 NOTE — Progress Notes (Signed)
Psychiatric Initial Adult Assessment   Patient Identification: Yesenia Reynolds MRN:  [id number] Date of Evaluation:  08/08/2016 Referral Source: Dr. Abran Duke, oncologist at [facility] health Chief Complaint:   Chief Complaint    Depression; Anxiety; Follow-up     Visit Diagnosis:    ICD-9-CM ICD-10-CM   1. Generalized anxiety disorder 300.02 F41.1     History of Present Illness:  This patient is a 75 year old single white female who lives with a female housemate in [place]. 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  The patient was referred by her oncologist for further treatment and assessment of severe anxiety.  The patient states that she has had no prior psychiatric assessment or treatment. She began to have endometrial bleeding in April 2016. She was found to have endometrial cancer and underwent hysterectomy. Following that she had 2 bouts of chemotherapy followed by radiation and eventually intravaginal radiation. She states that she finished all these treatments in October of 2016. She states that she did very well throughout the treatments and stayed calm and did not have significant problems with nausea and vomiting.  In November 2016 she began to have significant problems with anxiety. She didn't want anyone coming around and visiting her she felt extremely anxious being around people are going out to restaurants or to church. She is been a very gregarious person all her life and her personality totally changed. She denied being depressed sad or crying. She was shaking all the time and couldn't stay alone. She still can't stay alone. She stopped driving her car. She gave up things she enjoyed like going to her nephew's baseball games. She also could not watch anything frightening like police shows on TV because she would become extremely anxious. She is eating fairly well but lost a good deal of weight during her treatments. She is sleeping well  and denies nightmares. She states that she had short-term memory loss shortly after treatment but this is gotten much better.  She was tried on Paxil but it was not helpful. Her doctor has her on Xanax 0.25 mg which does help but she takes it as needed. Her gynecologist put her on Lamictal and she is up to 150 mg a day. She thinks this is helped more than anything else because she is less anxious than she was and is slowly starting to drive a little bit and be around a few people at a time. Her hand still shake at times. She would like to get out and do all the things she used to do such as drive on her own go to baseball games enjoy her friends and church but she is beginning to realize that this is going to be a slow process. She denies suicidal ideation or auditory or visual hallucinations  The patient returns after 2 months. She is slowly getting better. She is getting out a little bit more and has even driven around her neighborhood on her own. She still has breakthrough anxiety and it seems as if the Xanax isn't lasting through the day. I suggested we grow up to the 0.5 mg dosage. If it makes her too drowsy she can always cut it in half. The Lamictal continues to help her mood and she is getting a lot out of her counseling with Maurice Small. She denies being depressed and is sleeping well   Associated Signs/Symptoms: Depression Symptoms:  psychomotor agitation, difficulty concentrating, panic attacks,  Anxiety  Symptoms:  Agoraphobia, Excessive Worry, Panic Symptoms, Social Anxiety,   Past Psychiatric History: none  Previous Psychotropic Medications: Yes   Substance Abuse History in the last 12 months:  No.  Consequences of Substance Abuse: NA  Past Medical History:  Past Medical History:  Diagnosis Date  . Agoraphobia with panic attacks 08/07/2016  . Arthritis   . Cancer ([place])   . Carotid artery disease (HCC)    Less than 50% ICA stenoses 6/13  . Endometrial cancer, FIGO stage  IIIC ([place]) 08/08/2015  . Essential hypertension, benign   . GERD (gastroesophageal reflux disease)   . Hypothyroidism   . Mixed hyperlipidemia   . Palpitations   . Spinal stenosis   . Spinal stenosis of lumbar region at multiple levels 08/07/2016  . Type 2 diabetes mellitus ([place])     Past Surgical History:  Procedure Laterality Date  . ABDOMINAL HYSTERECTOMY    . BREAST BIOPSY     benign  . LIPOMA EXCISION    . Multiple dental extractions    . TONSILECTOMY, ADENOIDECTOMY, BILATERAL MYRINGOTOMY AND TUBES    . TOTAL KNEE ARTHROPLASTY  08/04/2012   Procedure: TOTAL KNEE ARTHROPLASTY;  Surgeon: Gearlean Alf, MD;  Location: [facility];  Service: Orthopedics;  Laterality: Left;    Family Psychiatric History: none  Family History:  Family History  Problem Relation Age of Onset  . Stroke Mother   . Heart failure Mother   . Hypertension Father   . Coronary artery disease Father     Social History:   Social History   Social History  . Marital status: Single    Spouse name: N/A  . Number of children: N/A  . Years of education: N/A   Social History Main Topics  . Smoking status: Never Smoker  . Smokeless tobacco: Never Used  . Alcohol use No     Comment: 07/10/2016 per pt no  . Drug use: No     Comment: 8/3/2017per pt no   . Sexual activity: No   Other Topics Concern  . None   Social History Narrative  . None    Additional Social History: The patient grew up in [place] with both parents and one younger sister. She states that she had an excellent childhood with no history of trauma or abuse. 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 She is never married and has lived with the same friend for 40 years. As a high school student and a younger person she was very active in sports such as softball and basketball and still very much enjoys watching the sports  Allergies:   Allergies  Allergen Reactions  . Morphine  And Related Other (See Comments)    Hypotension  . Paxil [Paroxetine Hcl]     Metabolic Disorder Labs: No results found for: HGBA1C, MPG No results found for: PROLACTIN No results found for: CHOL, TRIG, HDL, CHOLHDL, VLDL, LDLCALC   Current Medications: Current Outpatient Prescriptions  Medication Sig Dispense Refill  . aspirin 81 MG tablet Take 81 mg by mouth daily.    Marland Kitchen atorvastatin (LIPITOR) 10 MG tablet Take 10 mg by mouth every evening.     . fentaNYL (DURAGESIC - DOSED MCG/HR) 75 MCG/HR Place 1 patch (75 mcg total) onto the skin every 3 (three) days. 10 patch 0  . ibuprofen (ADVIL,MOTRIN) 200 MG tablet Take 200 mg by mouth every 6 (six) hours as needed.    Marland Kitchen  lamoTRIgine (LAMICTAL) 100 MG tablet Taking 0.5 Tablets in AM and 1 Tablet in PM 60 tablet 2  . latanoprost (XALATAN) 0.005 % ophthalmic solution Place 1 drop into both eyes at bedtime.    Marland Kitchen levothyroxine (SYNTHROID, LEVOTHROID) 88 MCG tablet Take 88 mcg by mouth daily before breakfast.     . lidocaine-prilocaine (EMLA) cream Apply 1 application topically as needed. With chemo    . lisinopril (PRINIVIL,ZESTRIL) 10 MG tablet Take 10 mg by mouth every morning.     . loratadine (CLARITIN) 10 MG tablet Take 10 mg by mouth daily as needed for allergies.    . magnesium oxide (MAG-OX) 400 MG tablet Take 400 mg by mouth 2 (two) times daily.    . metoprolol succinate (TOPROL-XL) 25 MG 24 hr tablet TAKE 1/2 TABLET BY MOUTH EVERY MORNING (PATIENT NEEDS OFFICE VISIT PER MD) 45 tablet 3  . ondansetron (ZOFRAN) 8 MG tablet Take 8 mg by mouth as needed.    . polyethylene glycol (MIRALAX / GLYCOLAX) packet Take 17 g by mouth daily.    . prochlorperazine (COMPAZINE) 10 MG tablet Take 10 mg by mouth as needed.    . ALPRAZolam (XANAX) 0.5 MG tablet Take 1 tablet (0.5 mg total) by mouth 4 (four) times daily. 120 tablet 2   No current facility-administered medications for this visit.     Neurologic: Headache: No Seizure:  No Paresthesias:No  Musculoskeletal: Strength & Muscle Tone: within normal limits Gait & Station: unsteady Patient leans: N/A  Psychiatric Specialty Exam: Review of Systems  Musculoskeletal: Positive for back pain.  Psychiatric/Behavioral: The patient is nervous/anxious.   All other systems reviewed and are negative.   Blood pressure (!) 161/77, pulse 82, height 5\' 2"  (1.575 m), weight 143 lb 12.8 oz (65.2 kg).Body mass index is 26.3 kg/m.  General Appearance: Casual, Neat and Well Groomed walking with a walker   Eye Contact:  Good  Speech:  Clear and Coherent  Volume:  Normal  Mood:  Anxious  Affect:  Congruent  Thought Process:  Goal Directed  Orientation:  Full (Time, Place, and Person)  Thought Content:  Rumination  Suicidal Thoughts:  No  Homicidal Thoughts:  No  Memory:  Immediate;   Good Recent;   Good Remote;   Good  Judgement:  Fair  Insight:  Good  Psychomotor Activity:  Normal  Concentration:  Concentration: Good and Attention Span: Fair  Recall:  Good  Fund of Knowledge:Good  Language: Good  Akathisia:  No  Handed:  Right  AIMS (if indicated):    Assets:  Communication Skills Desire for Improvement Resilience Social Support Talents/Skills  ADL's:  Intact  Cognition: WNL  Sleep:  ok    Treatment Plan Summary: Medication management   The patient will continue Lamictal 50 mg in the morning and 100 mg at bedtime. She'll increase Tenex to 0.5 mg 4 times a day for anxiety. She'll return to see me in 2 months and we'll continue her counseling   [name], MD 9/1/20179:51 AM

## 2016-08-12 ENCOUNTER — Encounter: Payer: Self-pay | Admitting: *Deleted

## 2016-08-12 NOTE — Progress Notes (Signed)
[facility] Clinical Social Work  Clinical Social Work was referred by Engineer, [employer] for assessment of psychosocial needs due to anxiety in surviviorship. Clinical Social Worker contacted patient at home to offer support and assess for needs.  CSW spoke briefly to pt, but she was not up to talking today and had her caregiver, Marcie Bal to take a message. CSW introduced self, explained role of CSW and left phone contact numbers. Pt encouraged to returning call if open to talking prior to appt, if not CSW will see pt on 9/19.     Clinical Social Work  Loren Racer, [place] Tuesdays   [phone]

## 2016-08-14 DIAGNOSIS — Z8542 Personal history of malignant neoplasm of other parts of uterus: Secondary | ICD-10-CM | POA: Diagnosis not present

## 2016-08-14 DIAGNOSIS — F418 Other specified anxiety disorders: Secondary | ICD-10-CM | POA: Diagnosis not present

## 2016-08-18 ENCOUNTER — Encounter (HOSPITAL_COMMUNITY): Payer: Self-pay | Admitting: Psychiatry

## 2016-08-18 ENCOUNTER — Ambulatory Visit (INDEPENDENT_AMBULATORY_CARE_PROVIDER_SITE_OTHER): Payer: Medicare Other | Admitting: Psychiatry

## 2016-08-18 DIAGNOSIS — F411 Generalized anxiety disorder: Secondary | ICD-10-CM

## 2016-08-18 NOTE — Progress Notes (Signed)
  Patient:  Yesenia Reynolds   DOB: 1941-05-12  MR Number: [id number]  Location: [place]:  [address] [place],  [place], [address]  Start: Monday 08/18/2016 11:16 AM   End: Monday 08/18/2016 12:09 PM      Provider/Observer:     Maurice Small, MSW, LCSW   Chief Complaint:      Chief Complaint  Patient presents with  . Anxiety    Reason For Service:     [name] is a 75 year old female who presents with symptoms of anxiety that began after she had treatment for cancer in 2016. Prior to treatment, patient reports being very independent and outgoing. After treatment, she became fearful of staying alone, driving, and being away from home. She reports excessive worry and says she developed shakiness. She also reports nervousness and decreased social involvement.  Interventions Strategy:  Supportive  Participation Level:   Active  Participation Quality:  Appropriate      Behavioral Observation:  Casual, Alert, and Anxious   Current Psychosocial Factors: Decreased independence as patient is fearful of driving, concerns and guilt about the effects of her condition on her housemate and her family, conflict with housemate  Content of Session:   reviewed symptoms, facilitated expression of feelings,explored patterns of communication and patient's relationship with her housemate and changes in the relationship since patient's diagnosis of cancer, discussed being assertive versus being aggressive, used role play and assisted patient identify ways to improve assertiveness skills in the relationship with her house mate  Current Status:   Anxiety, excessive worry, muscle tension, social withdrawal  Suicidal/Homicidal:    No  Patient Progress:   Patient reports decreased muscle tension and increased social involvement since last session. She states now enjoying more people visiting her home. She also likes talking on the phone more. She continues to experience anxiety and  excessive worry. She continues to fear driving and misses her independence. She reports recent conflict with housemate has been very stressful and caused increased anxiety and worry.   Target Goals:   1. Learn and implement calming skills to reduce/manage overall symptoms of anxiety  Last Reviewed:     Goals Addressed Today:    1  Plan:                   Return again in 2 weeks.  Impression/Diagnosis:  Patient presents with symptoms of anxiety that began after she had treatment for cancer in 2016. She reports excessive worry, motor tension, and social withdrawal.       Diagnosis:  Axis I: No diagnosis found.          Axis II: Deferred   [name], LCSW 08/18/2016

## 2016-08-23 ENCOUNTER — Encounter (HOSPITAL_COMMUNITY): Payer: Self-pay | Admitting: Oncology

## 2016-08-26 ENCOUNTER — Encounter: Payer: Self-pay | Admitting: *Deleted

## 2016-08-26 ENCOUNTER — Encounter (HOSPITAL_COMMUNITY): Payer: Medicare Other | Attending: Hematology & Oncology | Admitting: Adult Health

## 2016-08-26 ENCOUNTER — Encounter (HOSPITAL_COMMUNITY): Payer: Self-pay | Admitting: Adult Health

## 2016-08-26 DIAGNOSIS — F4001 Agoraphobia with panic disorder: Secondary | ICD-10-CM | POA: Diagnosis not present

## 2016-08-26 DIAGNOSIS — N899 Noninflammatory disorder of vagina, unspecified: Secondary | ICD-10-CM

## 2016-08-26 DIAGNOSIS — K5900 Constipation, unspecified: Secondary | ICD-10-CM | POA: Diagnosis not present

## 2016-08-26 DIAGNOSIS — C541 Malignant neoplasm of endometrium: Secondary | ICD-10-CM | POA: Insufficient documentation

## 2016-08-26 NOTE — Progress Notes (Signed)
[address], [place] [address]   CLINIC:  Survivorship  REASON FOR VISIT:  Long-term survivorship visit for history of endometrial cancer   BRIEF ONCOLOGY HISTORY:    Endometrial cancer ([place])   02/16/2015 Initial Diagnosis    Endometrial cancer ([place]).  The endometrial cancer found on D&C, grade 2 by Dr. Barrie Dunker.      03/30/2015 Surgery    Robotic hysterectomy, bilateral salpingo-oophorectomy, sentinel lymph node mapping and resection, mini-laparotomy by Dr. Clenton Pare.      04/02/2015 Cancer Staging    Stage IIIc 1, grade 2      04/26/2015 - 10/03/2015 Chemotherapy    The patient had palonosetron (ALOXI) injection 0.25 mg, 0.25 mg, Intravenous,  Once, 6 of 6 cycles  CARBOplatin (PARAPLATIN) in sodium chloride 0.9 % 100 mL chemo infusion, , Intravenous,  Once, 6 of 6 cycles  PACLitaxel (TAXOL) 294 mg in dextrose 5 % 250 mL chemo infusion (> 80mg /m2), 175 mg/m2 = 294 mg, Intravenous,  Once, 6 of 6 cycles  for chemotherapy treatment.        06/27/2015 - 07/31/2015 Radiation Therapy    External beam radiation therapy      08/08/2015 - 08/21/2015 Radiation Therapy    Vaginal cuff intracavitary brachytherapy delivered, August 31, September 6, 08/21/2015. 18 gray in 3 fractions.      02/20/2016 Imaging    CT Abd/Pelvis- no evidence of metastatic disease status post hysterectomy. Improving fluid collection along the right pelvic sidewall, probably postoperative lymphocele or seroma.        INTERVAL HISTORY:  Ms. Yauch presents to [facility] today after referral from Dr. Daria Pastures, PA-C for additional support since completing treatment for endometrial cancer.  She is seen today with our social worker [employer], LCSW.   Psychologically, she has really struggled since completing treatment.  She does not want to leave the house; she does not want to see people. She does not want to socialize with other people, with the exception  of about 4 close friends, her family, and her companion, Marcie Bal.  Marcie Bal recently tried to leave the patient home alone a few weeks ago to go to the hair salon, and the patient "had a full-blown panic attack; it was awful."  She now has friends that will come to her house to "sit with her" when Marcie Bal needs to leave the house.  Ms. Connolly doesn't feel like she is sleeping well.  She says "I wake up in the morning Dr. Harrington Challenger is her psychiatrist; she sees a Social worker as well Chief Operating Officer). She has seen Peggy 3 times thus far.  She is Lamictal, which makes her hands shake.  She has some memory concerns as well, which she attributes to the medication.   Peripheral neuropathy is improving; she is able to be more independent in caring for herself (bathing, dressing, etc).  She wears Fentanyl patch for chronic pain.    She has some vaginal bleeding after using her vaginal dilator.  When asked if she is having any blood in her stools or per rectum, she states, "I can't always tell if the bleeding is coming from my vagina or my rectum, but I think it's my vagina."   She endorses some occasional constipation; she takes Miralax which is usually effective.    ADDITIONAL REVIEW OF SYSTEMS:  Review of Systems  Psychiatric/Behavioral: Positive for depression and memory loss. The patient is nervous/anxious and has insomnia.    12-point review of  systems completed and negative, except as stated above.   PAST MEDICAL & SURGICAL HISTORY:  Past Medical History:  Diagnosis Date  . Agoraphobia with panic attacks 08/07/2016  . Arthritis   . Cancer ([place])   . Carotid artery disease (HCC)    Less than 50% ICA stenoses 6/13  . Endometrial cancer, FIGO stage IIIC ([place]) 08/08/2015  . Essential hypertension, benign   . GERD (gastroesophageal reflux disease)   . Hypothyroidism   . Mixed hyperlipidemia   . Palpitations   . Spinal stenosis   . Spinal stenosis of lumbar region at multiple levels 08/07/2016  . Type 2 diabetes mellitus  ([place])    Past Surgical History:  Procedure Laterality Date  . ABDOMINAL HYSTERECTOMY    . BREAST BIOPSY     benign  . LIPOMA EXCISION    . Multiple dental extractions    . TONSILECTOMY, ADENOIDECTOMY, BILATERAL MYRINGOTOMY AND TUBES    . TOTAL KNEE ARTHROPLASTY  08/04/2012   Procedure: TOTAL KNEE ARTHROPLASTY;  Surgeon: Gearlean Alf, MD;  Location: [facility];  Service: Orthopedics;  Laterality: Left;     SOCIAL HISTORY: Ms. Dutrow lives with her friend, Marcie Bal, in [place], [place].  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  She loves to read and watch sports, particularly baseball.  She does not smoke or use illicit drugs.   CURRENT MEDICATIONS:  Current Outpatient Prescriptions on File Prior to Visit  Medication Sig Dispense Refill  . ALPRAZolam (XANAX) 0.5 MG tablet Take 1 tablet (0.5 mg total) by mouth 4 (four) times daily. 120 tablet 2  . aspirin 81 MG tablet Take 81 mg by mouth daily.    Marland Kitchen atorvastatin (LIPITOR) 10 MG tablet Take 10 mg by mouth every evening.     . fentaNYL (DURAGESIC - DOSED MCG/HR) 75 MCG/HR Place 1 patch (75 mcg total) onto the skin every 3 (three) days. 10 patch 0  . ibuprofen (ADVIL,MOTRIN) 200 MG tablet Take 200 mg by mouth every 6 (six) hours as needed.    . lamoTRIgine (LAMICTAL) 100 MG tablet Taking 0.5 Tablets in AM and 1 Tablet in PM 60 tablet 2  . latanoprost (XALATAN) 0.005 % ophthalmic solution Place 1 drop into both eyes at bedtime.    Marland Kitchen levothyroxine (SYNTHROID, LEVOTHROID) 88 MCG tablet Take 88 mcg by mouth daily before breakfast.     . lidocaine-prilocaine (EMLA) cream Apply 1 application topically as needed. With chemo    . lisinopril (PRINIVIL,ZESTRIL) 10 MG tablet Take 10 mg by mouth every morning.     . loratadine (CLARITIN) 10 MG tablet Take 10 mg by mouth daily as needed for allergies.    . magnesium oxide (MAG-OX) 400 MG tablet Take 400 mg by mouth 2 (two) times daily.    . metoprolol  succinate (TOPROL-XL) 25 MG 24 hr tablet TAKE 1/2 TABLET BY MOUTH EVERY MORNING (PATIENT NEEDS OFFICE VISIT PER MD) 45 tablet 3  . ondansetron (ZOFRAN) 8 MG tablet Take 8 mg by mouth as needed.    . polyethylene glycol (MIRALAX / GLYCOLAX) packet Take 17 g by mouth daily.    . prochlorperazine (COMPAZINE) 10 MG tablet Take 10 mg by mouth as needed.     No current facility-administered medications on file prior to visit.     ALLERGIES: Allergies  Allergen Reactions  . Morphine And Related Other (See Comments)    Hypotension  . Paxil [Paroxetine Hcl]  PHYSICAL EXAM:  General: Female in no acute distress.  Accompanied by her friend, Marcie Bal, and her sister, Fraser Din.  HEENT: Head is normocephalic.  Conjunctivae clear without exudate.  Sclerae anicteric.  Cardiovascular: Normal rate and rhythm Respiratory: Clear to auscultation bilaterally. Chest expansion symmetric without accessory muscle use. Breathing non-labored.    GU: Deferred.   GI: Soft, non-tender abdomen. Normoactive bowel sounds.  Neuro: Mild bilat resting hand tremors. Steady gait.   Psych: Slightly anxious mood; affect normal. Periodically histrionic during interview.  Extremities: No edema.  Skin: Warm and dry.   LABORATORY DATA: None for this visit.   DIAGNOSTIC IMAGING:  None for this visit.    ASSESSMENT & PLAN:  Ms. Mankiewicz is a74 y.o. female with history of Stage IIIC1 endometrial cancer, treated with Carbo/Taxol, external beam radiation, and vaginal cuff brachytherapy; completed treatment on 08/21/15.  Patient presents to survivorship clinic today for long-term survivorship visit and continued support.   1. Stage IIIC1 endometrial cancer: She will continue surveillance visits with Dr. Whitney Muse and Kirby Crigler, PA-C as directed.  She will return to the cancer center in 10/2016.  Encouraged her to call me with any questions or concerns before that time and I would be happy to see her sooner, if needed.    2.  Agoraphobia with panic disorder:  We discussed strategies to continue to support her during this difficult time.  I commended her for seeing her counselor and encouraged her to continue to do so.  Some strategies that we discussed that may be helpful for her is setting some reasonable, time-sensitive goals that she can work towards which include: being able to see her nephew play baseball in the Spring for his senior year of high school and go to "Thursday lunches" with her longtime friends.  It will be critical for her to maintain her follow-up with her psychiatric team.  Loren Racer, LCSW and I offered support today through validation of concerns and expressive supportive counseling.  The patient was able to identify how some things in her life are improving, including "worrying a little less", becoming a bit more independent, and driving her car short distances.  She hopes to be able to drive again on her own and go on trips with her friends and family again in the future.  I assured her that I have hope that this is possible and encouraged her to continue to work hard on her counseling and psychiatric care.  She knows to let us know how we can continue to be of support for her.  She was given my card today and encouraged to contact me or Abby Potash if we can be of further help/support for her.    3. Vaginal dryness/stenosis s/p radiation therapy: We discussed that likely her vaginal dryness is secondary to both menopause, as well as her treatments for endometrial cancer. She is using her vaginal dilators appropriately to prevent worsening vaginal stenosis in patients after pelvic and brachytherapy to the pelvic & vagina.  She uses a lubricant for use of dilator, but still experiences bleeding.  I explained that this was likely due to vaginal dryness, as well as possible stenosis.  Encouraged her to try coconut oil vaginally to use as both a vaginal moisturizer and lubricant, as needed.   If her vaginal bleeding  continues, it will be important to report to her cancer treatment team.  Also encouraged her to let us know if she notices any blood in her stools as well, as we  would want to further evaluate this symptom as well.  She agreed with this plan.    4. Constipation: Encouraged continued use of Miralax; could increase to twice daily if needed.  She could add an additional OTC stool softener as well if needed.      Dispo:  -Return to [facility] to see Dr. Whitney Muse in 10/2016.   A total of 60 minutes was spent in face-to-face care of this patient, with greater than 50% of that time spent in counseling and care coordination.   Mike Craze, NP Survivorship Program [place] [phone]

## 2016-08-26 NOTE — Progress Notes (Signed)
[facility]   Date:  08/26/16   First Name: Xylia "Mortimer Fries"  M.I.: L     Last Name: Dann MRN:  [id number]    Primary Cancer Type: No matching staging information was found for the patient.        Marital Status: Single  Practical Problems: yes     Employment:  retired   Scientist, physiological of Income: [place]: MEDICARE   Family Problems: yes  yes Concerns caring for family needs: yes    Emotional Problems: yes  Concerns of Adjustment to Diagnosis/Treatment: yes   Current Symptoms of Anxiety: yes   Current Symptoms of Depression: no  Safety/Risk Concerns: no   Mental Status Exam Orientation: person, place, and time Affect: anxious Thought: goal directed      Spiritual/Religious: None identified  Living Situation: with good friend, Marcie Bal  Functional Status: Assistance Required, Housekeeping, Medication Management and Transportation Marcie Bal has been assisting with bill payment, housekeeping and other family are assisting with transportation.                                                                                             Strengths and Barriers To Treatment:   Patient Coping Strengths:   Supportive Relationships, Family, Friends, [place], [employer], Hopefulness and Able to [employer]                                                                                                  Identified Problems/Needs and Barriers to Care:  Illness-related problems, Museum/gallery curator, Transportation, ADLs Assistance, Adjustment to Illness, Emotional/Behavioral/Cognitive, Family and social conflict/isolation and Caregiver issues    Counseling and Social Work Interventions and Recommendations:   Brief Counseling/Psychotherapy, Data processing manager, Conservator, museum/gallery strategies and Support Group/Group  Counseling   Impressions/Plan: CSW met with pt, her caregiver; Marcie Bal and her sister; Fraser Din at [facility] visit with Mike Craze, NP. Pt was treated at [facility], so this is CSW first visit with pt and family today. Pt has had issues with agoraphobia and debilitating anxiety since being diagnosed with cancer and completing treatment. Pt reports she had no trouble getting herself to her treatments or doctor's appointments, but now feels she cannot leave the house. Pt describes a very active life prior to her cancer. Pt feels she cannot drive and was very active prior to her cancer. Pt shared she is goal oriented and would like to be able to see and do more, but has isolated herself from others. Pt has strong support from her sister, extended family and Pt has connected with [facility] in [place] and has been to three counseling sessions per her report. She feels counseling  is beginning to help and was encouraged strongly to continue. Pt reports she has her next session next week. CSW reviewed additional supports for cancer survivors in order to better process her experience. CSW reviewed options and resources for this. Pt stated she has talked to other survivors and not found that helpful. CSW encouraged her to reconsider at a later date. Caregiver, Marcie Bal appears to need support as well and CSW encouraged options/need for caregivers and families after cancer as well. Both pt and family were educated on various resources and availability for future follow up. They agree to reach out as needed.   Loren Racer, [place] [facility]   [phone]

## 2016-09-01 ENCOUNTER — Encounter (HOSPITAL_COMMUNITY): Payer: Self-pay

## 2016-09-02 ENCOUNTER — Ambulatory Visit (INDEPENDENT_AMBULATORY_CARE_PROVIDER_SITE_OTHER): Payer: Medicare Other | Admitting: Psychiatry

## 2016-09-02 ENCOUNTER — Encounter (HOSPITAL_COMMUNITY): Payer: Self-pay | Admitting: Psychiatry

## 2016-09-02 DIAGNOSIS — F411 Generalized anxiety disorder: Secondary | ICD-10-CM

## 2016-09-02 NOTE — Progress Notes (Signed)
  Patient:  Yesenia Reynolds   DOB: 11/14/1941  MR Number: [id number]  Location: [place]:  [address] [place],  [place], [address]  Start: Tuesday 09/02/2016 11:00 AM  End: Tuesday 09/02/2016 11:55 AM    Provider/Observer:     Maurice Small, MSW, LCSW   Chief Complaint:      Chief Complaint  Patient presents with  . Anxiety    Reason For Service:     Yesenia Reynolds is a 75 year old female who presents with symptoms of anxiety that began after she had treatment for cancer in 2016. Prior to treatment, patient reports being very independent and outgoing. After treatment, she became fearful of staying alone, driving, and being away from home. She reports excessive worry and says she developed shakiness. She also reports nervousness and decreased social involvement.  Interventions Strategy:  Supportive  Participation Level:   Active  Participation Quality:  Appropriate      Behavioral Observation:  Casual, Alert, and Anxious   Current Psychosocial Factors: Decreased independence as patient is fearful of driving, concerns and guilt about the effects of her condition on her housemate and her family, stressful relationship with sister  Content of Session:   reviewed symptoms, facilitated expression of feelings, discussed patient's pattern of communication and patient's relationship with her sister, assisted patient identify strengths and supports, developed treatment plan, reviewed relaxation technique using controlled breathing, assigned patient to practice controlled breathing 5 minutes 2 x per day  Current Status:   Anxiety, excessive worry, muscle tension, social withdrawal  Suicidal/Homicidal:    No  Patient Progress:   Patient reports continued anxiety and excessive worry since last session. She continues to fear driving and staying home alone but expresses desire to do so. She reports worry about relationship with sister and states often feeling caught in the  middle between sister and her housemate.  Patient continues to have difficulty being assertive and setting boundaries. She reports she has been practicing controlled breathing at times and reports it has been helpful. She reports she hasn't gone out to dinner with other friends but is starting to have the desire to do this.    Target Goals:   1. Learn and implement calming skills to reduce/manage overall symptoms of anxiety    2. Undergo gradual imaginal  exposure to driving and staying home alone    3. Learn and implement personal and interpersonal skills to reduce anxiety and improve interpersonal relationships  Last Reviewed:   09/02/2016  Goals Addressed Today:    1,2,3  Plan:                   Return again in 2 weeks. Patient agrees to practice controlled breathing 5 minutes 2 times per day.  Impression/Diagnosis:  Patient presents with symptoms of anxiety that began after she had treatment for cancer in 2016. She reports excessive worry, motor tension, and social withdrawal.  She worries about a variety of issues.      Diagnosis:  Axis I: Generalized anxiety disorder          Axis II: Deferred   Krystl Wickware, LCSW 09/02/2016

## 2016-09-04 DIAGNOSIS — H01021 Squamous blepharitis right upper eyelid: Secondary | ICD-10-CM | POA: Diagnosis not present

## 2016-09-04 DIAGNOSIS — H01022 Squamous blepharitis right lower eyelid: Secondary | ICD-10-CM | POA: Diagnosis not present

## 2016-09-11 ENCOUNTER — Encounter (HOSPITAL_COMMUNITY): Payer: Self-pay

## 2016-09-11 DIAGNOSIS — N939 Abnormal uterine and vaginal bleeding, unspecified: Secondary | ICD-10-CM | POA: Diagnosis not present

## 2016-09-11 DIAGNOSIS — C541 Malignant neoplasm of endometrium: Secondary | ICD-10-CM | POA: Diagnosis not present

## 2016-09-12 DIAGNOSIS — M4306 Spondylolysis, lumbar region: Secondary | ICD-10-CM | POA: Diagnosis not present

## 2016-09-12 DIAGNOSIS — M4307 Spondylolysis, lumbosacral region: Secondary | ICD-10-CM | POA: Diagnosis not present

## 2016-09-12 DIAGNOSIS — M47817 Spondylosis without myelopathy or radiculopathy, lumbosacral region: Secondary | ICD-10-CM | POA: Diagnosis not present

## 2016-09-15 ENCOUNTER — Ambulatory Visit (INDEPENDENT_AMBULATORY_CARE_PROVIDER_SITE_OTHER): Payer: Medicare Other | Admitting: Psychiatry

## 2016-09-15 ENCOUNTER — Encounter (HOSPITAL_COMMUNITY): Payer: Self-pay | Admitting: Psychiatry

## 2016-09-15 ENCOUNTER — Ambulatory Visit (HOSPITAL_COMMUNITY): Payer: Self-pay | Admitting: Psychiatry

## 2016-09-15 DIAGNOSIS — F411 Generalized anxiety disorder: Secondary | ICD-10-CM

## 2016-09-15 NOTE — Progress Notes (Signed)
  Patient:  Yesenia Reynolds   DOB: December 13, 1940  MR Number: [id number]  Location: [facility]:  [address] [place],  [place], [address]  Start: Monday 09/15/2016 3:15 PM End: Monday 09/15/2016 4:05 PM               Provider/Observer:     Maurice Small, MSW, LCSW   Chief Complaint:      Chief Complaint  Patient presents with  . Anxiety    Reason For Service:     [name] is a 75 year old female who presents with symptoms of anxiety that began after she had treatment for cancer in 2016. Prior to treatment, patient reports being very independent and outgoing. After treatment, she became fearful of staying alone, driving, and being away from home. She reports excessive worry and says she developed shakiness. She also reports nervousness and decreased social involvement.  Interventions Strategy:  Supportive  Participation Level:   Active  Participation Quality:  Appropriate      Behavioral Observation:  Casual, Alert, and Anxious   Current Psychosocial Factors: Decreased independence as patient is fearful of driving, concerns and guilt about the effects of her condition on her housemate and her family, stressful relationship with sister  Content of Session:   reviewed symptoms,administered GAD-7, facilitated expression of feelings, assisted patient identify triggers of anxiety, discussed boundary issues in patient's relationships, assisted patient identify ways to improve assertiveness skills, began to identify thoughts that inhibit effective assertion, encouraged patient to continue practicing controlled breathing Current Status:   Anxiety, excessive worry, muscle tension, social withdrawal  Suicidal/Homicidal:    No  Patient Progress:   Patient reports decreased anxiety and excessive worry since last session. She continues to fear driving but is driving short distances more frequently. She also says she is ready to go out to dinner and plans to do so for her  birthday in the next two weeks. She continues to fear staying home alone and expresses frustration she is not able to do things she did before she was diagnosed with cancer. She admits putting pressure on self and fears she isn't improving fast enough. Patient reports continues stress regarding relationship with her sister.   Target Goals:   1. Learn and implement calming skills to reduce/manage overall symptoms of anxiety    2. Undergo gradual imaginal  exposure to driving and staying home alone    3. Learn and implement personal and interpersonal skills to reduce anxiety and improve interpersonal relationships  Last Reviewed:   09/02/2016  Goals Addressed Today:    3  Plan:                   Return again in 2 weeks. Patient agrees to practice controlled breathing 5 minutes 2 times per day.  Impression/Diagnosis:  Patient presents with symptoms of anxiety that began after she had treatment for cancer in 2016. She reports excessive worry, motor tension, and social withdrawal.  She worries about a variety of issues.      Diagnosis:  Axis I: Generalized anxiety disorder          Axis II: Deferred   Brylen Wagar, LCSW 09/15/2016

## 2016-09-16 ENCOUNTER — Ambulatory Visit (HOSPITAL_COMMUNITY): Payer: Self-pay | Admitting: Psychiatry

## 2016-09-18 DIAGNOSIS — K625 Hemorrhage of anus and rectum: Secondary | ICD-10-CM | POA: Diagnosis not present

## 2016-09-30 ENCOUNTER — Encounter (HOSPITAL_COMMUNITY): Payer: Medicare Other | Attending: Hematology & Oncology

## 2016-09-30 ENCOUNTER — Other Ambulatory Visit (HOSPITAL_COMMUNITY): Payer: Self-pay

## 2016-09-30 ENCOUNTER — Telehealth (HOSPITAL_COMMUNITY): Payer: Self-pay | Admitting: *Deleted

## 2016-09-30 ENCOUNTER — Other Ambulatory Visit (HOSPITAL_COMMUNITY): Payer: Self-pay | Admitting: Oncology

## 2016-09-30 ENCOUNTER — Encounter (HOSPITAL_COMMUNITY): Payer: Self-pay | Admitting: Psychiatry

## 2016-09-30 ENCOUNTER — Ambulatory Visit (INDEPENDENT_AMBULATORY_CARE_PROVIDER_SITE_OTHER): Payer: Medicare Other | Admitting: Psychiatry

## 2016-09-30 VITALS — BP 177/65 | HR 71 | Temp 98.5°F | Resp 20

## 2016-09-30 DIAGNOSIS — C541 Malignant neoplasm of endometrium: Secondary | ICD-10-CM

## 2016-09-30 DIAGNOSIS — Z452 Encounter for adjustment and management of vascular access device: Secondary | ICD-10-CM | POA: Diagnosis present

## 2016-09-30 DIAGNOSIS — F411 Generalized anxiety disorder: Secondary | ICD-10-CM

## 2016-09-30 DIAGNOSIS — Z95828 Presence of other vascular implants and grafts: Secondary | ICD-10-CM

## 2016-09-30 MED ORDER — SODIUM CHLORIDE 0.9% FLUSH
10.0000 mL | INTRAVENOUS | Status: DC | PRN
  Administered 2016-09-30: 10 mL via INTRAVENOUS
  Filled 2016-09-30: qty 10

## 2016-09-30 MED ORDER — FENTANYL 75 MCG/HR TD PT72: 75.0000 ug | MEDICATED_PATCH | TRANSDERMAL | 0 refills | Status: DC

## 2016-09-30 MED ORDER — HEPARIN SOD (PORK) LOCK FLUSH 100 UNIT/ML IV SOLN
500.0000 [IU] | Freq: Once | INTRAVENOUS | Status: AC
  Administered 2016-09-30: 500 [IU] via INTRAVENOUS

## 2016-09-30 MED ORDER — HEPARIN SOD (PORK) LOCK FLUSH 100 UNIT/ML IV SOLN
INTRAVENOUS | Status: AC
  Filled 2016-09-30: qty 5

## 2016-09-30 NOTE — Telephone Encounter (Signed)
Printed  Tanea Moga, PA-C 09/30/2016 5:16 PM

## 2016-09-30 NOTE — Patient Instructions (Signed)
[facility] at [facility] Discharge Instructions  RECOMMENDATIONS MADE BY THE CONSULTANT AND ANY TEST RESULTS WILL BE SENT TO YOUR REFERRING PHYSICIAN.  Port flushed per protocol. Follow-up as scheduled. Call clinic for any questions or concerns  Thank you for choosing [facility] at [facility] to provide your oncology and hematology care.  To afford each patient quality time with our provider, please arrive at least 15 minutes before your scheduled appointment time.   Beginning January 23rd 2017 lab work for the Cancer Center will be done in the  Main lab at [facility] on 1st floor. If you have a lab appointment with the Cancer Center please come in thru the  Main Entrance and check in at the main information desk  You need to re-schedule your appointment should you arrive 10 or more minutes late.  We strive to give you quality time with our providers, and arriving late affects you and other patients whose appointments are after yours.  Also, if you no show three or more times for appointments you may be dismissed from the clinic at the providers discretion.     Again, thank you for choosing [facility].  Our hope is that these requests will decrease the amount of time that you wait before being seen by our physicians.       _____________________________________________________________  Should you have questions after your visit to [facility], please contact our office at [phone] between the hours of 8:30 a.m. and 4:30 p.m.  Voicemails left after 4:30 p.m. will not be returned until the following business day.  For prescription refill requests, have your pharmacy contact our office.         Resources For Cancer Patients and their Caregivers ? American Cancer Society: Can assist with transportation, wigs, general needs, runs Look Good Feel Better.        1-888-227-6333 ? Cancer Care: Provides financial  assistance, online support groups, medication/co-pay assistance.  1-800-813-HOPE (4673) ? [facility] Assists [place] cancer patients and their families through emotional , educational and financial support.  336-427-4357 ? [place] DSS Where to apply for food stamps, Medicaid and utility assistance. 336-342-1394 ? [facility]: Transportation to medical appointments. 336-347-2287 ? Social Security Administration: May apply for disability if have a Stage IV cancer. 336-342-7796 1-800-772-1213 ? [place] Aging, Disability and Transit Services: Assists with nutrition, care and transit needs. 336-349-2343  Cancer Center Support Programs: @10RELATIVEDAYS@ > Cancer Support Group  2nd Tuesday of the month 1pm-2pm, Journey Room  > Creative Journey  3rd Tuesday of the month 1130am-1pm, Journey Room  > Look Good Feel Better  1st Wednesday of the month 10am-12 noon, Journey Room (Call American Cancer Society to register 1-800-395-5775)   

## 2016-09-30 NOTE — Telephone Encounter (Signed)
Patient called for refill on pain medication. Chart checked and refilled

## 2016-09-30 NOTE — Progress Notes (Signed)
  Patient:  Yesenia Reynolds   DOB: 12/05/1941  MR Number: [id number]  Location: [place] [facility]:  [address] [place],  [place], [address]  Start: Tuesday 09/29/2016 11:05 AM   End: Tuesday 09/29/2016 11:55 AM              Provider/Observer:     Maurice Small, MSW, LCSW   Chief Complaint:      Chief Complaint  Patient presents with  . Anxiety    Reason For Service:     [name] is a 75 year old female who presents with symptoms of anxiety that began after she had treatment for cancer in 2016. Prior to treatment, patient reports being very independent and outgoing. After treatment, she became fearful of staying alone, driving, and being away from home. She reports excessive worry and says she developed shakiness. She also reports nervousness and decreased social involvement.  Interventions Strategy:  Supportive  Participation Level:   Active  Participation Quality:  Appropriate      Behavioral Observation:  Casual, Alert, and Anxious   Current Psychosocial Factors: Decreased independence as patient is fearful of driving, concerns and guilt about the effects of her condition on her housemate and her family, stressful relationship with sister,upcoming colonoscopy  Content of Session:   reviewed symptoms, Praise and reinforced patient's use of controlled breathing, discussed effects of use of controlled breathing, facilitated expression of feelings, assisted patient identify triggers of anxiety, assisted patient identify preventions and interventions to manage anxiety, discussed connection between thoughts/mood/behavior, assisted patient identify and replace negative thought patterns about prepping for and having colonoscopy with realistic healthy alternatives, discussed rationale for and practice mindfulness exercise using breath awareness   Current Status:   Anxiety, excessive worry, muscle tension,   Suicidal/Homicidal:    No  Patient Progress:   Good. Patient  reports Practicing controlled breathing daily. She reports this has been helpful and now being able to recognize anxiety and tension in her body more quickly. She has been able to intervene more quickly andreports anxiety is not as intense. She reports having sleep difficulty last night and being nervous about coming to her appointment today. She reports being preoccupied with thoughts about having a colonoscopy this Friday 10/03/2016    Target Goals:   1. Learn and implement calming skills to reduce/manage overall symptoms of anxiety    2. Undergo gradual imaginal  exposure to driving and staying home alone    3. Learn and implement personal and interpersonal skills to reduce anxiety and improve interpersonal relationships  Last Reviewed:   09/02/2016  Goals Addressed Today:    1  Plan:                   Return again in 2 weeks. Patient agrees to implement strategies discussed in session.  Impression/Diagnosis:  Patient presents with symptoms of anxiety that began after she had treatment for cancer in 2016. She reports excessive worry, motor tension, and social withdrawal.  She worries about a variety of issues.      Diagnosis:  Axis I: Generalized anxiety disorder          Axis II: Deferred   Coralee Edberg, LCSW 09/30/2016

## 2016-09-30 NOTE — Progress Notes (Signed)
Yesenia Reynolds tolerated port flush well without complaints or incident. Port flushed with 10 ml NS and 5 ml Heparin easily per protocol. Pt discharged self ambulatory using walker in satisfactory condition with family member

## 2016-10-02 ENCOUNTER — Encounter (HOSPITAL_COMMUNITY): Payer: Self-pay

## 2016-10-02 DIAGNOSIS — R112 Nausea with vomiting, unspecified: Secondary | ICD-10-CM | POA: Diagnosis not present

## 2016-10-02 DIAGNOSIS — R531 Weakness: Secondary | ICD-10-CM | POA: Diagnosis not present

## 2016-10-02 DIAGNOSIS — R404 Transient alteration of awareness: Secondary | ICD-10-CM | POA: Diagnosis not present

## 2016-10-03 DIAGNOSIS — M1990 Unspecified osteoarthritis, unspecified site: Secondary | ICD-10-CM | POA: Diagnosis not present

## 2016-10-03 DIAGNOSIS — I10 Essential (primary) hypertension: Secondary | ICD-10-CM | POA: Diagnosis not present

## 2016-10-03 DIAGNOSIS — I4891 Unspecified atrial fibrillation: Secondary | ICD-10-CM | POA: Diagnosis not present

## 2016-10-03 DIAGNOSIS — R531 Weakness: Secondary | ICD-10-CM | POA: Diagnosis not present

## 2016-10-03 DIAGNOSIS — R1110 Vomiting, unspecified: Secondary | ICD-10-CM | POA: Diagnosis not present

## 2016-10-03 DIAGNOSIS — Z7982 Long term (current) use of aspirin: Secondary | ICD-10-CM | POA: Diagnosis not present

## 2016-10-03 DIAGNOSIS — R55 Syncope and collapse: Secondary | ICD-10-CM | POA: Diagnosis not present

## 2016-10-03 DIAGNOSIS — Z79899 Other long term (current) drug therapy: Secondary | ICD-10-CM | POA: Diagnosis not present

## 2016-10-03 DIAGNOSIS — R197 Diarrhea, unspecified: Secondary | ICD-10-CM | POA: Diagnosis not present

## 2016-10-03 DIAGNOSIS — E119 Type 2 diabetes mellitus without complications: Secondary | ICD-10-CM | POA: Diagnosis not present

## 2016-10-03 DIAGNOSIS — Z79891 Long term (current) use of opiate analgesic: Secondary | ICD-10-CM | POA: Diagnosis not present

## 2016-10-06 ENCOUNTER — Encounter (HOSPITAL_COMMUNITY): Payer: Self-pay

## 2016-10-08 ENCOUNTER — Ambulatory Visit (HOSPITAL_COMMUNITY): Payer: Self-pay | Admitting: Psychiatry

## 2016-10-14 ENCOUNTER — Ambulatory Visit (INDEPENDENT_AMBULATORY_CARE_PROVIDER_SITE_OTHER): Payer: Medicare Other | Admitting: Psychiatry

## 2016-10-14 ENCOUNTER — Encounter (HOSPITAL_COMMUNITY): Payer: Self-pay | Admitting: Psychiatry

## 2016-10-14 DIAGNOSIS — F411 Generalized anxiety disorder: Secondary | ICD-10-CM

## 2016-10-14 NOTE — Progress Notes (Signed)
  Patient:  Yesenia Reynolds   DOB: 11/09/1941  MR Number: [id number]  Location: [place]:  [address] [place],  [place], [address]  Start: Tuesday  10/14/2016 11 :17 AM   End: Tuesday  10/14/2016  12:08 PM                   Provider/Observer:     Maurice Small, MSW, LCSW   Chief Complaint:      Chief Complaint  Patient presents with  . Anxiety    Reason For Service:     [name] is a 75 year old female who presents with symptoms of anxiety that began after she had treatment for cancer in 2016. Prior to treatment, patient reports being very independent and outgoing. After treatment, she became fearful of staying alone, driving, and being away from home. She reports excessive worry and says she developed shakiness. She also reports nervousness and decreased social involvement.  Interventions Strategy:  Supportive  Participation Level:   Active  Participation Quality:  Appropriate      Behavioral Observation:  Casual, Alert, Tearfuland Anxious   Current Psychosocial Factors: Housemate has multiple health issues and recently was admitted to rehab facility after recent hospitalization, decreased independence as patient is fearful of driving, , stressful relationship with sister,  Content of Session:   reviewed symptoms,facilitated expression of feelings, explored options regarding care, assisted patient identify ways to express concerns to sister, assisted patient identify possible positives about staying with sister temporarily, introduced back burner/front burner techniques to assist patient identify areas within her control, reviewed relaxation techniques    Current Status:   Anxiety, excessive worry, muscle tension, tearfulness  Suicidal/Homicidal:    No  Patient Progress:   Poor. Patient reports Increased stress and anxiety as her housemate was hospitalized a week ago due to multiple health issues. She was discharged to a rehabilitation facility yesterday.  Patient fears her housemate may remain in the facility and not return home. Patient remains fearful of staying alone and has had a friend stay with her at night but says she knows this will not continue. Her sister has been supportive and has offered patient to stay with her and her husband until housemate is discharged home. However, patient expresses reluctance to do this due to she is sister not having a very close relationship. Patient expresses frustration and anxiety as she states feeling as though she has no control.     Target Goals:   1. Learn and implement calming skills to reduce/manage overall symptoms of anxiety    2. Undergo gradual imaginal  exposure to driving and staying home alone    3. Learn and implement personal and interpersonal skills to reduce anxiety and improve interpersonal relationships  Last Reviewed:   09/02/2016  Goals Addressed Today:    1,2  Plan:                   Return again in 2 weeks. Patient agrees to implement strategies discussed in session.  Impression/Diagnosis:  Patient presents with symptoms of anxiety that began after she had treatment for cancer in 2016. She reports excessive worry, motor tension, and social withdrawal.  She worries about a variety of issues.      Diagnosis:  Axis I: Generalized anxiety disorder          Axis II: Deferred   BYNUM,PEGGY, LCSW 10/14/2016

## 2016-10-20 DIAGNOSIS — M47812 Spondylosis without myelopathy or radiculopathy, cervical region: Secondary | ICD-10-CM | POA: Diagnosis not present

## 2016-10-20 DIAGNOSIS — M546 Pain in thoracic spine: Secondary | ICD-10-CM | POA: Diagnosis not present

## 2016-10-20 DIAGNOSIS — M9902 Segmental and somatic dysfunction of thoracic region: Secondary | ICD-10-CM | POA: Diagnosis not present

## 2016-10-20 DIAGNOSIS — M9903 Segmental and somatic dysfunction of lumbar region: Secondary | ICD-10-CM | POA: Diagnosis not present

## 2016-10-20 DIAGNOSIS — M531 Cervicobrachial syndrome: Secondary | ICD-10-CM | POA: Diagnosis not present

## 2016-10-20 DIAGNOSIS — M47816 Spondylosis without myelopathy or radiculopathy, lumbar region: Secondary | ICD-10-CM | POA: Diagnosis not present

## 2016-10-20 DIAGNOSIS — M9901 Segmental and somatic dysfunction of cervical region: Secondary | ICD-10-CM | POA: Diagnosis not present

## 2016-10-21 ENCOUNTER — Other Ambulatory Visit: Payer: Self-pay | Admitting: *Deleted

## 2016-10-21 NOTE — Telephone Encounter (Signed)
Spoke with [facility] drug on behalf of pt regarding Toprol XL not in pill pack when pt has 3 refills left - pharmacy tech said she would have Toprol ready for pt to pick this afternoon - didn't know why it was not being placed with her other medications. Pt made aware

## 2016-10-22 DIAGNOSIS — M47816 Spondylosis without myelopathy or radiculopathy, lumbar region: Secondary | ICD-10-CM | POA: Diagnosis not present

## 2016-10-22 DIAGNOSIS — M546 Pain in thoracic spine: Secondary | ICD-10-CM | POA: Diagnosis not present

## 2016-10-22 DIAGNOSIS — M9901 Segmental and somatic dysfunction of cervical region: Secondary | ICD-10-CM | POA: Diagnosis not present

## 2016-10-22 DIAGNOSIS — M531 Cervicobrachial syndrome: Secondary | ICD-10-CM | POA: Diagnosis not present

## 2016-10-22 DIAGNOSIS — M9903 Segmental and somatic dysfunction of lumbar region: Secondary | ICD-10-CM | POA: Diagnosis not present

## 2016-10-22 DIAGNOSIS — M9902 Segmental and somatic dysfunction of thoracic region: Secondary | ICD-10-CM | POA: Diagnosis not present

## 2016-10-22 DIAGNOSIS — M47812 Spondylosis without myelopathy or radiculopathy, cervical region: Secondary | ICD-10-CM | POA: Diagnosis not present

## 2016-10-24 DIAGNOSIS — M9901 Segmental and somatic dysfunction of cervical region: Secondary | ICD-10-CM | POA: Diagnosis not present

## 2016-10-24 DIAGNOSIS — M9902 Segmental and somatic dysfunction of thoracic region: Secondary | ICD-10-CM | POA: Diagnosis not present

## 2016-10-24 DIAGNOSIS — M546 Pain in thoracic spine: Secondary | ICD-10-CM | POA: Diagnosis not present

## 2016-10-24 DIAGNOSIS — M9903 Segmental and somatic dysfunction of lumbar region: Secondary | ICD-10-CM | POA: Diagnosis not present

## 2016-10-24 DIAGNOSIS — M531 Cervicobrachial syndrome: Secondary | ICD-10-CM | POA: Diagnosis not present

## 2016-10-24 DIAGNOSIS — M47816 Spondylosis without myelopathy or radiculopathy, lumbar region: Secondary | ICD-10-CM | POA: Diagnosis not present

## 2016-10-24 DIAGNOSIS — M47812 Spondylosis without myelopathy or radiculopathy, cervical region: Secondary | ICD-10-CM | POA: Diagnosis not present

## 2016-10-27 ENCOUNTER — Telehealth (HOSPITAL_COMMUNITY): Payer: Self-pay | Admitting: *Deleted

## 2016-10-27 ENCOUNTER — Ambulatory Visit (INDEPENDENT_AMBULATORY_CARE_PROVIDER_SITE_OTHER): Payer: Medicare Other | Admitting: Psychiatry

## 2016-10-27 ENCOUNTER — Encounter (HOSPITAL_COMMUNITY): Payer: Self-pay | Admitting: Psychiatry

## 2016-10-27 DIAGNOSIS — M9903 Segmental and somatic dysfunction of lumbar region: Secondary | ICD-10-CM | POA: Diagnosis not present

## 2016-10-27 DIAGNOSIS — M9902 Segmental and somatic dysfunction of thoracic region: Secondary | ICD-10-CM | POA: Diagnosis not present

## 2016-10-27 DIAGNOSIS — M531 Cervicobrachial syndrome: Secondary | ICD-10-CM | POA: Diagnosis not present

## 2016-10-27 DIAGNOSIS — M546 Pain in thoracic spine: Secondary | ICD-10-CM | POA: Diagnosis not present

## 2016-10-27 DIAGNOSIS — M47816 Spondylosis without myelopathy or radiculopathy, lumbar region: Secondary | ICD-10-CM | POA: Diagnosis not present

## 2016-10-27 DIAGNOSIS — M9901 Segmental and somatic dysfunction of cervical region: Secondary | ICD-10-CM | POA: Diagnosis not present

## 2016-10-27 DIAGNOSIS — F411 Generalized anxiety disorder: Secondary | ICD-10-CM | POA: Diagnosis not present

## 2016-10-27 DIAGNOSIS — M47812 Spondylosis without myelopathy or radiculopathy, cervical region: Secondary | ICD-10-CM | POA: Diagnosis not present

## 2016-10-27 NOTE — Progress Notes (Signed)
  Patient:  Yesenia Reynolds   DOB: 06/30/1941  MR Number: [id number]  Location: [place]:  [address] [place],  [place], [address]  Start:  Monday 10/27/2016 11:10 AM   End:  Monday 10/27/2016 12:03 PM                Provider/Observer:     Maurice Small, MSW, LCSW   Chief Complaint:      Chief Complaint  Patient presents with  . Anxiety  . Depression    Reason For Service:     Yesenia Reynolds is a 75 year old female who presents with symptoms of anxiety that began after she had treatment for cancer in 2016. Prior to treatment, patient reports being very independent and outgoing. After treatment, she became fearful of staying alone, driving, and being away from home. She reports excessive worry and says she developed shakiness. She also reports nervousness and decreased social involvement.  Interventions Strategy:  Supportive       Participation Level:   Active  Participation Quality:  Appropriate      Behavioral Observation:  Casual, Alert,  Anxious   Current Psychosocial Factors: Housemate has multiple health issues and recently was admitted to rehab facility after recent hospitalization, decreased independence as patient is fearful of driving, , stressful relationship with sister,  Content of Session:   reviewed symptoms,facilitated expression of feelings, explored options regarding care, assisted patient identify ways to improve assertiveness skills in communication with sister, reviewed back burner/front burner techniques to assist patient identify areas within her control, reviewed relaxation techniques and practiced mindfulness technique using breath awareness   Current Status:   Anxiety, excessive worry, muscle tension,   Suicidal/Homicidal:    No  Patient Progress:   Fair. Patient reports her housemate remains in a rehabilitation facility. She continues to worry about her housemate possibly not returning home as well as care for housemate if she does  return home. She is thankful she is able to stay with her housemate at the facility during the day. She reports not being as anious and says it has been helpful to be around more people. Patient expresses disappointment and frustration regarding her sister's response to patient's current needs and living situation. Patient has hired to people who alternate staying in with patient at night. Patient reports she has been practicing the controlled breathing which has helped.   Target Goals:   1. Learn and implement calming skills to reduce/manage overall symptoms of anxiety    2. Undergo gradual imaginal  exposure to driving and staying home alone    3. Learn and implement personal and interpersonal skills to reduce anxiety and improve interpersonal relationships  Last Reviewed:   09/02/2016  Goals Addressed Today:    1,3  Plan:                   Return again in 2 weeks. Patient agrees to implement strategies discussed in session and practice relaxation technique daily.  Impression/Diagnosis:  Patient presents with symptoms of anxiety that began after she had treatment for cancer in 2016. She reports excessive worry, motor tension, and social withdrawal.  She worries about a variety of issues.      Diagnosis:  Axis I: Generalized anxiety disorder          Axis II: Deferred   Yesenia Helfand, LCSW 10/27/2016

## 2016-10-27 NOTE — Telephone Encounter (Signed)
Patient asking for refill of Xanax. Chart reviewed, last appointment 08/08/16. Patient cancelled 11/1 appointment due to emergency. Last fill 9/1 with 2 refills given. No future appointments scheduled with Dr. Harrington Challenger.

## 2016-10-27 NOTE — Telephone Encounter (Signed)
voice message left on 10/24/16, need her Xanax.

## 2016-10-28 ENCOUNTER — Telehealth (HOSPITAL_COMMUNITY): Payer: Self-pay | Admitting: *Deleted

## 2016-10-28 ENCOUNTER — Encounter (HOSPITAL_BASED_OUTPATIENT_CLINIC_OR_DEPARTMENT_OTHER): Payer: Medicare Other

## 2016-10-28 ENCOUNTER — Encounter (HOSPITAL_COMMUNITY): Payer: Self-pay | Admitting: Hematology & Oncology

## 2016-10-28 ENCOUNTER — Encounter (HOSPITAL_COMMUNITY): Payer: Medicare Other | Attending: Hematology & Oncology | Admitting: Hematology & Oncology

## 2016-10-28 ENCOUNTER — Other Ambulatory Visit (HOSPITAL_COMMUNITY): Payer: Self-pay | Admitting: Pharmacist

## 2016-10-28 VITALS — BP 155/58 | HR 81 | Temp 98.9°F | Resp 18 | Wt 144.4 lb

## 2016-10-28 DIAGNOSIS — M48061 Spinal stenosis, lumbar region without neurogenic claudication: Secondary | ICD-10-CM | POA: Diagnosis not present

## 2016-10-28 DIAGNOSIS — C541 Malignant neoplasm of endometrium: Secondary | ICD-10-CM | POA: Insufficient documentation

## 2016-10-28 DIAGNOSIS — F4001 Agoraphobia with panic disorder: Secondary | ICD-10-CM

## 2016-10-28 DIAGNOSIS — G8929 Other chronic pain: Secondary | ICD-10-CM

## 2016-10-28 DIAGNOSIS — Z23 Encounter for immunization: Secondary | ICD-10-CM | POA: Diagnosis present

## 2016-10-28 DIAGNOSIS — Z95828 Presence of other vascular implants and grafts: Secondary | ICD-10-CM

## 2016-10-28 LAB — CBC WITH DIFFERENTIAL/PLATELET
BASOS ABS: 0 10*3/uL (ref 0.0–0.1)
BASOS PCT: 0 %
Eosinophils Absolute: 0.1 10*3/uL (ref 0.0–0.7)
Eosinophils Relative: 2 %
HEMATOCRIT: 36.4 % (ref 36.0–46.0)
HEMOGLOBIN: 11.5 g/dL — AB (ref 12.0–15.0)
LYMPHS PCT: 15 %
Lymphs Abs: 0.6 10*3/uL — ABNORMAL LOW (ref 0.7–4.0)
MCH: 28.6 pg (ref 26.0–34.0)
MCHC: 31.6 g/dL (ref 30.0–36.0)
MCV: 90.5 fL (ref 78.0–100.0)
MONOS PCT: 7 %
Monocytes Absolute: 0.3 10*3/uL (ref 0.1–1.0)
NEUTROS ABS: 3 10*3/uL (ref 1.7–7.7)
NEUTROS PCT: 76 %
Platelets: 178 10*3/uL (ref 150–400)
RBC: 4.02 MIL/uL (ref 3.87–5.11)
RDW: 13.2 % (ref 11.5–15.5)
WBC: 4 10*3/uL (ref 4.0–10.5)

## 2016-10-28 LAB — COMPREHENSIVE METABOLIC PANEL
ALBUMIN: 3.9 g/dL (ref 3.5–5.0)
ALK PHOS: 126 U/L (ref 38–126)
ALT: 30 U/L (ref 14–54)
ANION GAP: 6 (ref 5–15)
AST: 28 U/L (ref 15–41)
BUN: 16 mg/dL (ref 6–20)
CALCIUM: 9.2 mg/dL (ref 8.9–10.3)
CO2: 30 mmol/L (ref 22–32)
Chloride: 102 mmol/L (ref 101–111)
Creatinine, Ser: 0.73 mg/dL (ref 0.44–1.00)
GFR calc non Af Amer: >60 mL/min (ref >60)
GLUCOSE: 107 mg/dL — AB (ref 65–99)
POTASSIUM: 4 mmol/L (ref 3.5–5.1)
SODIUM: 138 mmol/L (ref 135–145)
Total Bilirubin: 0.5 mg/dL (ref 0.3–1.2)
Total Protein: 6.4 g/dL — ABNORMAL LOW (ref 6.5–8.1)

## 2016-10-28 LAB — MAGNESIUM: Magnesium: 1.9 mg/dL (ref 1.7–2.4)

## 2016-10-28 MED ORDER — INFLUENZA VAC SPLIT QUAD 0.5 ML IM SUSY
0.5000 mL | PREFILLED_SYRINGE | Freq: Once | INTRAMUSCULAR | Status: AC
  Administered 2016-10-28: 0.5 mL via INTRAMUSCULAR

## 2016-10-28 MED ORDER — SODIUM CHLORIDE 0.9% FLUSH
20.0000 mL | INTRAVENOUS | Status: DC | PRN
  Administered 2016-10-28: 20 mL via INTRAVENOUS
  Filled 2016-10-28: qty 20

## 2016-10-28 MED ORDER — HEPARIN SOD (PORK) LOCK FLUSH 100 UNIT/ML IV SOLN
INTRAVENOUS | Status: AC
  Filled 2016-10-28: qty 5

## 2016-10-28 MED ORDER — INFLUENZA VAC SPLIT QUAD 0.5 ML IM SUSY
PREFILLED_SYRINGE | INTRAMUSCULAR | Status: AC
  Filled 2016-10-28: qty 0.5

## 2016-10-28 MED ORDER — HEPARIN SOD (PORK) LOCK FLUSH 100 UNIT/ML IV SOLN
500.0000 [IU] | Freq: Once | INTRAVENOUS | Status: AC
  Administered 2016-10-28: 500 [IU] via INTRAVENOUS

## 2016-10-28 NOTE — Patient Instructions (Signed)
[place] at [facility] Discharge Instructions  RECOMMENDATIONS MADE BY THE CONSULTANT AND ANY TEST RESULTS WILL BE SENT TO YOUR REFERRING PHYSICIAN. Port flushed per protocol after labs drawn thru port today. Follow-up as scheduled. Call clinic for any questions or concerns  Thank you for choosing [place] at [facility] to provide your oncology and hematology care.  To afford each patient quality time with our provider, please arrive at least 15 minutes before your scheduled appointment time.   Beginning January 23rd 2017 lab work for the [facility] will be done in the  Main lab at [facility] on 1st floor. If you have a lab appointment with the [place] [facility] please come in thru the  Main Entrance and check in at the main information desk  You need to re-schedule your appointment should you arrive 10 or more minutes late.  We strive to give you quality time with our providers, and arriving late affects you and other patients whose appointments are after yours.  Also, if you no show three or more times for appointments you may be dismissed from the clinic at the providers discretion.     Again, thank you for choosing [facility].  Our hope is that these requests will decrease the amount of time that you wait before being seen by our physicians.       _____________________________________________________________  Should you have questions after your visit to [facility], please contact our office at [phone] between the hours of 8:30 a.m. and 4:30 p.m.  Voicemails left after 4:30 p.m. will not be returned until the following business day.  For prescription refill requests, have your pharmacy contact our office.         Resources For Cancer Patients and their Caregivers ? American Cancer Society: Can assist with transportation, wigs, general needs, runs Look Good Feel Better.        820-258-2126 ? Cancer  Care: Provides financial assistance, online support groups, medication/co-pay assistance.  1-800-813-HOPE (248) 247-8590) ? [place] Assists [place] cancer patients and their families through emotional , educational and financial support.  9890896958 ? [place] DSS Where to apply for food stamps, Medicaid and utility assistance. 925-230-4772 ? [facility]: Transportation to medical appointments. 5626984410 ? Social Security Administration: May apply for disability if have a Stage IV cancer. 614-735-9297 229-715-9796 ? [employer], Disability and Transit Services: Assists with nutrition, care and transit needs. [place] Support Programs: @10RELATIVEDAYS @ > Cancer Support Group  2nd Tuesday of the month 1pm-2pm, Journey Room  > Creative Journey  3rd Tuesday of the month 1130am-1pm, Journey Room  > Look Good Feel Better  1st Wednesday of the month 10am-12 noon, Journey Room (Call [place] to register [phone])

## 2016-10-28 NOTE — Progress Notes (Signed)
[name] presents today for injection per MD orders. Flu vaccine administered IM in left Upper Arm. Administration without incident. Patient tolerated well.

## 2016-10-28 NOTE — Progress Notes (Signed)
Yesenia Reynolds tolerated port flush with lab draw well without complaints or incident.Port accessed with 20 gauge needle then flushed with 10 ml NS and 5 ml Heparin easily per protocol after blood drawn for labs. Pt discharged self ambulatory using walker accompanied by sister

## 2016-10-28 NOTE — Telephone Encounter (Signed)
Returned call to patient regarding refill of Xanax. Patient states that she no longer needs the refill but if she has made a mistake she will call back next week as she has enough medication. Explained that she will need to have an MD appointment scheduled before refills could be given. She stated understanding.

## 2016-10-28 NOTE — Telephone Encounter (Signed)
No refills on controlled drugs without appt scheduled

## 2016-10-28 NOTE — Progress Notes (Signed)
[facility] Hematology/Oncology Progress Note  Name: Yesenia Reynolds      MRN: [id number]    Date: 10/28/2016 Time:6:23 PM   REFERRING PHYSICIAN:  Everardo All, MD (Medical Oncology at [facility])  [place]:  Transfer of medical oncology care.   DIAGNOSIS:  Stage IIIC1, Grade 2, endometrial cancer, treated at [facility] under the care of Dr. Tressie Stalker.  S/P systemic chemotherapy consisting Carboplatin/Paclitaxel x 3 cycles followed by EBRT (06/27/2015- 07/31/2015), then 3 more cycles of chemotherapy (04/26/2015- 10/03/2015) with vaginal cuff intracavitary brachytherapy (08/08/2015, 08/14/2015, and 08/21/2015).    Endometrial cancer ([place])   02/16/2015 Initial Diagnosis    Endometrial cancer ([place]).  The endometrial cancer found on D&C, grade 2 by Dr. Barrie Dunker.      03/30/2015 Surgery    Robotic hysterectomy, bilateral salpingo-oophorectomy, sentinel lymph node mapping and resection, mini-laparotomy by Dr. Clenton Pare.      04/02/2015 Cancer Staging    Stage IIIc 1, grade 2      04/26/2015 - 10/03/2015 Chemotherapy    The patient had palonosetron (ALOXI) injection 0.25 mg, 0.25 mg, Intravenous,  Once, 6 of 6 cycles  CARBOplatin (PARAPLATIN) in sodium chloride 0.9 % 100 mL chemo infusion, , Intravenous,  Once, 6 of 6 cycles  PACLitaxel (TAXOL) 294 mg in dextrose 5 % 250 mL chemo infusion (> 80mg /m2), 175 mg/m2 = 294 mg, Intravenous,  Once, 6 of 6 cycles  for chemotherapy treatment.        06/27/2015 - 07/31/2015 Radiation Therapy    External beam radiation therapy      08/08/2015 - 08/21/2015 Radiation Therapy    Vaginal cuff intracavitary brachytherapy delivered, August 31, September 6, 08/21/2015. 18 gray in 3 fractions.      02/20/2016 Imaging    CT Abd/Pelvis- no evidence of metastatic disease status post hysterectomy. Improving fluid collection along the right pelvic sidewall, probably postoperative lymphocele or seroma.       HISTORY OF PRESENT ILLNESS:     Yesenia Reynolds is a 75 y.o. female with a medical history significant for endometrial cancer, agoraphobia, severe spinal stenosis, CAD, HTN, GAD, hyperlipidemia who is referred to the [facility] for transfer of ongoing medical oncology care for Stage IIIC1, Grade 2, endometrial cancer, treated at [facility] under the care of Dr. Tressie Stalker.  S/P systemic chemotherapy consisting Carboplatin/Paclitaxel x 3 cycles followed by EBRT (06/27/2015- 07/31/2015), then 3 more cycles of chemotherapy (04/26/2015- 10/03/2015) with vaginal cuff intracavitary brachytherapy (08/08/2015, 08/14/2015, and 08/21/2015).  It is reported that prior to her diagnosis she was a very independent, strong person.  However, since her diagnosis and treatment, she does not leave her house and cannot be alone.  If either of these occur, she has a "panic attack."  She has been managed in the past by Dr. Tressie Stalker who was prescribing the patient #180 of Xanax.  Fortunately, she is now appropriately established with Dr. Harrington Challenger for treatment of this issue.  Ms. Fennimore returns to the [place] accompanied by her sister. She uses a walker to ambulate. She notes that her significant other has been hospitalized with multiple medical issues, she is now at a local nursing home for rehab.   She plans to have Thanksgiving dinner at her sister's.  She is eating fairly well. She is still not able to stay by herself. Since her last visit she has driven short distances alone without issue. Her belly is fine without any new abdominal pain.  She denies pain or falls. She does not sleep well and wakes up worried about multiple things.   Her sister asks more about 'chemo-brain' as the patient attributes her inability/fear to stay alone to "chemo brain."  States she has been doing better over the last 3 months.   She was supposed to have a colonoscopy, but "I got sick off the stuff".    Review of Systems  Constitutional: Negative for chills, fever  and weight loss.  HENT: Negative.   Eyes: Negative.  Negative for blurred vision, double vision and photophobia.  Respiratory: Negative.   Cardiovascular: Negative.  Negative for chest pain.  Gastrointestinal: Negative.  Negative for abdominal pain, constipation, diarrhea, nausea and vomiting.  Genitourinary: Negative.  Negative for dysuria and urgency.  Musculoskeletal: Negative.  Negative for back pain, falls, joint pain, myalgias and neck pain.  Skin: Negative.  Negative for rash.  Neurological: Negative.  Negative for dizziness, tingling, tremors, weakness and headaches.       "Chemo-brain"  Endo/Heme/Allergies: Negative.   Psychiatric/Behavioral: Positive for depression. The patient is nervous/anxious and has insomnia.   14 point review of systems was performed and is negative except as detailed under history of present illness and above   PAST MEDICAL HISTORY:   Past Medical History:  Diagnosis Date  . Agoraphobia with panic attacks 08/07/2016  . Arthritis   . Cancer ([place])   . Carotid artery disease (HCC)    Less than 50% ICA stenoses 6/13  . Endometrial cancer, FIGO stage IIIC ([place]) 08/08/2015  . Essential hypertension, benign   . GERD (gastroesophageal reflux disease)   . Hypothyroidism   . Mixed hyperlipidemia   . Palpitations   . Spinal stenosis   . Spinal stenosis of lumbar region at multiple levels 08/07/2016  . Type 2 diabetes mellitus (HCC)     ALLERGIES: Allergies  Allergen Reactions  . Morphine And Related Other (See Comments)    Hypotension  . Paxil [Paroxetine Hcl]       MEDICATIONS: I have reviewed the patient's current medications.    Current Outpatient Prescriptions on File Prior to Visit  Medication Sig Dispense Refill  . ALPRAZolam (XANAX) 0.5 MG tablet Take 1 tablet (0.5 mg total) by mouth 4 (four) times daily. 120 tablet 2  . aspirin 81 MG tablet Take 81 mg by mouth daily.    Marland Kitchen atorvastatin (LIPITOR) 10 MG tablet Take 10 mg by mouth every  evening.     . fentaNYL (DURAGESIC - DOSED MCG/HR) 75 MCG/HR Place 1 patch (75 mcg total) onto the skin every 3 (three) days. 10 patch 0  . ibuprofen (ADVIL,MOTRIN) 200 MG tablet Take 200 mg by mouth every 6 (six) hours as needed.    . lamoTRIgine (LAMICTAL) 100 MG tablet Taking 0.5 Tablets in AM and 1 Tablet in PM 60 tablet 2  . latanoprost (XALATAN) 0.005 % ophthalmic solution Place 1 drop into both eyes at bedtime.    Marland Kitchen levothyroxine (SYNTHROID, LEVOTHROID) 88 MCG tablet Take 88 mcg by mouth daily before breakfast.     . lidocaine-prilocaine (EMLA) cream Apply 1 application topically as needed. With chemo    . lisinopril (PRINIVIL,ZESTRIL) 10 MG tablet Take 10 mg by mouth every morning.     . loratadine (CLARITIN) 10 MG tablet Take 10 mg by mouth daily as needed for allergies.    . magnesium oxide (MAG-OX) 400 MG tablet Take 400 mg by mouth 2 (two) times daily.    . metoprolol succinate (TOPROL-XL) 25  MG 24 hr tablet TAKE 1/2 TABLET BY MOUTH EVERY MORNING (PATIENT NEEDS OFFICE VISIT PER MD) 45 tablet 3  . ondansetron (ZOFRAN) 8 MG tablet Take 8 mg by mouth as needed.    . polyethylene glycol (MIRALAX / GLYCOLAX) packet Take 17 g by mouth daily.    . prochlorperazine (COMPAZINE) 10 MG tablet Take 10 mg by mouth as needed.     No current facility-administered medications on file prior to visit.      PAST SURGICAL HISTORY Past Surgical History:  Procedure Laterality Date  . ABDOMINAL HYSTERECTOMY    . BREAST BIOPSY     benign  . LIPOMA EXCISION    . Multiple dental extractions    . TONSILECTOMY, ADENOIDECTOMY, BILATERAL MYRINGOTOMY AND TUBES    . TOTAL KNEE ARTHROPLASTY  08/04/2012   Procedure: TOTAL KNEE ARTHROPLASTY;  Surgeon: Gearlean Alf, MD;  Location: [facility];  Service: Orthopedics;  Laterality: Left;    FAMILY HISTORY: Family History  Problem Relation Age of Onset  . Stroke Mother   . Heart failure Mother   . Hypertension Father   . Coronary artery disease Father     Maternal uncle deceased of colon cancer, age of diagnosis unknown but felt to be greater than 64 2. Maternal aunt deceased of stomach cancer, age of diagnosis unknown but again felt to be greater than 35 at diagnosis.   SOCIAL HISTORY:  reports that she has never smoked. She has never used smokeless tobacco. She reports that she does not drink alcohol or use drugs.  Social History   Social History  . Marital status: Single    Spouse name: N/A  . Number of children: N/A  . Years of education: N/A   Social History Main Topics  . Smoking status: Never Smoker  . Smokeless tobacco: Never Used  . Alcohol use No     Comment: 07/10/2016 per pt no  . Drug use: No     Comment: 8/3/2017per pt no   . Sexual activity: No   Other Topics Concern  . None   Social History Narrative  . None  The patient grew up in [place] with both parents and one younger sister. She states that she had an excellent childhood with no history of trauma or abuse. 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 She is never married and has lived with the same friend for 40 years. As a high school student and a younger person she was very active in sports such as softball and basketball and still very much enjoys watching the sports  PERFORMANCE STATUS: The patient's performance status is 1 - Symptomatic but completely ambulatory  PHYSICAL EXAM: Most Recent Vital Signs: Blood pressure (!) 155/58, pulse 81, temperature 98.9 F (37.2 C), temperature source Oral, resp. rate 18, weight 144 lb 6.4 oz (65.5 kg), SpO2 97 %. Physical Exam  Constitutional: She is oriented to person, place, and time and well-developed, well-nourished, and in no distress.  Able to get on exam table with assistance  HENT:  Head: Normocephalic and atraumatic.  Mouth/Throat: Oropharynx is clear and moist.  Eyes: Conjunctivae and EOM are normal. Pupils are equal, round, and reactive to  light.  Neck: Normal range of motion. Neck supple.  Cardiovascular: Normal rate, regular rhythm and normal heart sounds.   Pulmonary/Chest: Effort normal and breath sounds normal.  Abdominal: Soft. Bowel sounds are normal.  Musculoskeletal: Normal range of motion.  Neurological: She is alert and oriented to person, place, and time. Gait normal.  Skin: Skin is warm and dry.  Nursing note and vitals reviewed.   LABORATORY DATA:  I have reviewed the data as listed. CBC    Component Value Date/Time   WBC 4.0 10/28/2016 1209   RBC 4.02 10/28/2016 1209   HGB 11.5 (L) 10/28/2016 1209   HCT 36.4 10/28/2016 1209   PLT 178 10/28/2016 1209   MCV 90.5 10/28/2016 1209   MCH 28.6 10/28/2016 1209   MCHC 31.6 10/28/2016 1209   RDW 13.2 10/28/2016 1209   LYMPHSABS 0.6 (L) 10/28/2016 1209   MONOABS 0.3 10/28/2016 1209   EOSABS 0.1 10/28/2016 1209   BASOSABS 0.0 10/28/2016 1209     Chemistry      Component Value Date/Time   NA 138 10/28/2016 1209   K 4.0 10/28/2016 1209   CL 102 10/28/2016 1209   CO2 30 10/28/2016 1209   BUN 16 10/28/2016 1209   CREATININE 0.73 10/28/2016 1209      Component Value Date/Time   CALCIUM 9.2 10/28/2016 1209   ALKPHOS 126 10/28/2016 1209   AST 28 10/28/2016 1209   ALT 30 10/28/2016 1209   BILITOT 0.5 10/28/2016 1209     Lab Results  Component Value Date   CA125 10.0 08/07/2016    RADIOGRAPHY: I have personally reviewed the radiological images as listed and agreed with the findings in the report.  No results found.          PATHOLOGY:  N/A   ASSESSMENT/PLAN:   Endometrial cancer  Stage IIIC1, Grade 2, endometrial cancer, treated at [facility] under the care of Dr. Tressie Stalker.  S/P systemic chemotherapy consisting Carboplatin/Paclitaxel x 3 cycles followed by EBRT (06/27/2015- 07/31/2015), then 3 more cycles of chemotherapy (04/26/2015- 10/03/2015) with vaginal cuff intracavitary brachytherapy (08/08/2015, 08/14/2015, and 08/21/2015).  Port flush today  with labs including magnesium. CBC and CMP reviewed and detailed above. Patient will be notified of Magnesium and CA -125 when available.   Port flush every 6-8 weeks.  Return in 3 months for labs: CBC diff, CMET, CA 125.  Return in 3 months for follow-up.  NCCN guidelines pertaining to surveillance of endometrial cancer illustrates the following surveillance guidelines (2.2017):  A. Physical exam every 3-6 months for 2-3 years and then every 6 months or annually.  B. CA-125 (optional) if elevated initially.  C. Imaging as clinically indicated  D. Consider genetic counseling/testing for patients (<39 years old) and those with a significant family history of endometrial and/or colorectal cancer and/or selected pathologic risk features.  E. Patient education regarding symptoms of potential recurrence, lifestyle, obesity, exercise, and nutrition.  F. Patient education regarding sexual health, vaginal dilator use, and vaginal lubricants/moisturizers.   Agoraphobia with panic attacks Agoraphobia with panic attacks, suspected to be secondary to malignancy diagnosis and treatment based upon history. Following with Dr. Harrington Challenger, she was encouraged today to continue with Dr. Harrington Challenger.  I have encouraged the patient to consider attending support groups, art therapy, etc. I think that these would be very beneficial for her. She has made some improvements since her last visit. She has been out driving short distances and is going to her sisters for Thanksgiving.   Spinal stenosis of lumbar region at multiple levels Chronic Pain Severe spinal stenosis at multiple levels.  Previously seen by Dr. Carloyn Manner. Now on Fentanyl 75 mch/hr by Dr. Tressie Stalker for non-oncologic pain. Refill Fentanyl provided today.  She is taking 75 mcg/hr for her  severe spinal stenosis.  In the future, we will need to discuss transferring her pain management to a more appropriate provider for non-oncology related pain.  Will refer back to Dr. Carloyn Manner  for further management of spinal stenosis.  Long-term pain management for spinal stenosis will be transferred to more appropriate provider in future based upon Dr. Rex Kras recommendations.  She will receive a flu shot today.  ORDERS PLACED FOR THIS ENCOUNTER: Orders Placed This Encounter  Procedures  . Comprehensive metabolic panel  . CBC with Differential  . Magnesium  . CA 125  . CBC with Differential  . Comprehensive metabolic panel  . CA 125    MEDICATIONS PRESCRIBED THIS ENCOUNTER: Meds ordered this encounter  Medications  . Influenza vac split quadrivalent PF (FLUARIX) injection 0.5 mL    All questions were answered. The patient knows to call the clinic with any problems, questions or concerns. We can certainly see the patient much sooner if necessary.  This document serves as a record of services personally performed by Ancil Linsey, MD. It was created on her behalf by Arlyce Harman, a trained medical scribe. The creation of this record is based on the scribe's personal observations and the provider's statements to them. This document has been checked and approved by the attending provider.  I have reviewed the above documentation for accuracy and completeness and I agree with the above.  This note is electronically signed [id number] Cyril Mourning, MD  10/28/2016 6:23 PM

## 2016-10-29 LAB — CA 125: CA 125: 9.6 U/mL (ref 0.0–38.1)

## 2016-11-11 ENCOUNTER — Encounter (HOSPITAL_COMMUNITY): Payer: Self-pay | Admitting: Psychiatry

## 2016-11-11 ENCOUNTER — Ambulatory Visit (INDEPENDENT_AMBULATORY_CARE_PROVIDER_SITE_OTHER): Payer: Medicare Other | Admitting: Psychiatry

## 2016-11-11 DIAGNOSIS — F411 Generalized anxiety disorder: Secondary | ICD-10-CM | POA: Diagnosis not present

## 2016-11-11 NOTE — Progress Notes (Signed)
Patient:  Yesenia Reynolds   DOB: 07/06/1941  MR Number: [id number]  Location: [place]:  [address] [place],  [place], [address]  Start:  Tuesday 11/11/2016 11:08 AM    End:  Tuesday 11/11/2016  11:59 AM                      Provider/Observer:     Maurice Small, MSW, LCSW   Chief Complaint:      Chief Complaint  Patient presents with  . Anxiety    Reason For Service:     Yesenia Reynolds is a 75 year old female who presents with symptoms of anxiety that began after she had treatment for cancer in 2016. Prior to treatment, patient reports being very independent and outgoing. After treatment, she became fearful of staying alone, driving, and being away from home. She reports excessive worry and says she developed shakiness. She also reports nervousness and decreased social involvement.  Interventions Strategy:  Supportive       Participation Level:   Active  Participation Quality:  Appropriate      Behavioral Observation:  Casual, Alert,  Anxious   Current Psychosocial Factors: Housemate has multiple health issues and  was admitted to rehab facility after recent hospitalization, decreased independence as patient is fearful of driving, , stressful relationship with sister,  Content of Session:   reviewed symptoms,facilitated expression of feelings, praised and reinforced patient's efforts to used mindfulness technique using breath awareness, assisted patient identify ways to gradually increase time at home alone, Assisted patient identify ways to be assertive versus being aggressive in communication, assisted patient identify coping statements to intervene in spiraling negative thoughts, \   Current Status:   Anxiety, excessive worry, muscle tension,   Suicidal/Homicidal:    No  Patient Progress:   Fair. Patient reports her housemate remains in a rehabilitation facility. She expresses increased worry that her housemate may not return home. Housemate had an MRI this  past week and she is waiting for results. Patient fears housemate may have cancer. Patient expresses increased sadness regarding housemate not being at home. She has begun to try to stay at home alone for about 30 minutes at a time. She also says she is considering driving herself to the rehabilitation facility. Patient continues to express frustration about feeling caught in the middle between her housemate and her sister and reports recent conflict with housemate about this. Patient reports she has been using mindfulness technique using breath awareness and says this has been helpful.   Target Goals:   1. Learn and implement calming skills to reduce/manage overall symptoms of anxiety    2. Undergo gradual imaginal  exposure to driving and staying home alone    3. Learn and implement personal and interpersonal skills to reduce anxiety and improve interpersonal relationships  Last Reviewed:   09/02/2016  Goals Addressed Today:    1,2, 3  Plan:                   Return again in 2 weeks. Patient agrees to implement strategies discussed in session and practice relaxation technique daily.  Impression/Diagnosis:  Patient presents with symptoms of anxiety that began after she had treatment for cancer in 2016. She reports excessive worry, motor tension, and social withdrawal.  She worries about a variety of issues.      Diagnosis:  Axis I: Generalized anxiety disorder          Axis II: Deferred  [place], [place] 11/11/2016

## 2016-11-13 DIAGNOSIS — F418 Other specified anxiety disorders: Secondary | ICD-10-CM | POA: Diagnosis not present

## 2016-11-13 DIAGNOSIS — Z8542 Personal history of malignant neoplasm of other parts of uterus: Secondary | ICD-10-CM | POA: Diagnosis not present

## 2016-11-24 ENCOUNTER — Ambulatory Visit (INDEPENDENT_AMBULATORY_CARE_PROVIDER_SITE_OTHER): Payer: Medicare Other | Admitting: Psychiatry

## 2016-11-24 ENCOUNTER — Encounter (HOSPITAL_COMMUNITY): Payer: Self-pay | Admitting: Psychiatry

## 2016-11-24 DIAGNOSIS — F411 Generalized anxiety disorder: Secondary | ICD-10-CM | POA: Diagnosis not present

## 2016-11-24 NOTE — Progress Notes (Signed)
  Patient:  Yesenia Reynolds   DOB: 1941-09-13  MR Number: [id number]  Location: [facility]:  [address] [place],  [place], [address]  Start:  Monday 11/24/2016 11:03 AM    End:  Monday 11/24/2016 11:45 AM                        Provider/Observer:     Maurice Small, MSW, LCSW   Chief Complaint:      No chief complaint on file.   Reason For Service:     Yesenia Reynolds is a 75 year old female who presents with symptoms of anxiety that began after she had treatment for cancer in 2016. Prior to treatment, patient reports being very independent and outgoing. After treatment, she became fearful of staying alone, driving, and being away from home. She reports excessive worry and says she developed shakiness. She also reports nervousness and decreased social involvement.  Interventions Strategy:  Supportive       Participation Level:   Active  Participation Quality:  Appropriate      Behavioral Observation:  Casual, Alert,  Anxious   Current Psychosocial Factors: Housemate has multiple health issues and  was admitted to rehab facility after recent hospitalization, decreased independence as patient is fearful of driving, , stressful relationship with sister,  Content of Session:   reviewed symptoms,facilitated expression of feelings, reviewed relaxation techniques assisted patient identify coping statements to intervene in spiraling negative thoughts, discussed ways to use support system and ways to explore options regarding care at home and assisted living   Current Status:   Anxiety, excessive worry, muscle tension,   Suicidal/Homicidal:    No  Patient Progress:   Fair. Patient reports her housemate remains in a rehabilitation facility. She is relieved medical tests indicate housemate does not have kidney cancer. However, she reports increased anxiety and stress related to possibility of housemate returning home in 2-3 weeks. Patient worries about how to take care of  housemate at that time. She also reports becoming very tired and overwhelmed with going to the rehabilitation facility daily to sit with her friend. but fears being at home by herself. She continues to have people stay with her at night. Patient expresses worry about her future and if she will be able to remain in her home.   Target Goals:   1. Learn and implement calming skills to reduce/manage overall symptoms of anxiety    2. Undergo gradual imaginal  exposure to driving and staying home alone    3. Learn and implement personal and interpersonal skills to reduce anxiety and improve interpersonal relationships  Last Reviewed:   09/02/2016  Goals Addressed Today:    1  Plan:                   Return again in 2 weeks. Patient agrees to implement strategies discussed in session and practice relaxation technique daily.  Impression/Diagnosis:  Patient presents with symptoms of anxiety that began after she had treatment for cancer in 2016. She reports excessive worry, motor tension, and social withdrawal.  She worries about a variety of issues.      Diagnosis:  Axis I: Generalized anxiety disorder          Axis II: Deferred   Zahlia Deshazer, LCSW 11/24/2016

## 2016-11-25 DIAGNOSIS — M546 Pain in thoracic spine: Secondary | ICD-10-CM | POA: Diagnosis not present

## 2016-11-25 DIAGNOSIS — M9903 Segmental and somatic dysfunction of lumbar region: Secondary | ICD-10-CM | POA: Diagnosis not present

## 2016-11-25 DIAGNOSIS — M47812 Spondylosis without myelopathy or radiculopathy, cervical region: Secondary | ICD-10-CM | POA: Diagnosis not present

## 2016-11-25 DIAGNOSIS — M531 Cervicobrachial syndrome: Secondary | ICD-10-CM | POA: Diagnosis not present

## 2016-11-25 DIAGNOSIS — M9901 Segmental and somatic dysfunction of cervical region: Secondary | ICD-10-CM | POA: Diagnosis not present

## 2016-11-25 DIAGNOSIS — M47816 Spondylosis without myelopathy or radiculopathy, lumbar region: Secondary | ICD-10-CM | POA: Diagnosis not present

## 2016-11-25 DIAGNOSIS — M9902 Segmental and somatic dysfunction of thoracic region: Secondary | ICD-10-CM | POA: Diagnosis not present

## 2016-11-28 ENCOUNTER — Other Ambulatory Visit (HOSPITAL_COMMUNITY): Payer: Self-pay | Admitting: Psychiatry

## 2016-11-28 DIAGNOSIS — M9903 Segmental and somatic dysfunction of lumbar region: Secondary | ICD-10-CM | POA: Diagnosis not present

## 2016-11-28 DIAGNOSIS — M9902 Segmental and somatic dysfunction of thoracic region: Secondary | ICD-10-CM | POA: Diagnosis not present

## 2016-11-28 DIAGNOSIS — M9901 Segmental and somatic dysfunction of cervical region: Secondary | ICD-10-CM | POA: Diagnosis not present

## 2016-11-28 DIAGNOSIS — M531 Cervicobrachial syndrome: Secondary | ICD-10-CM | POA: Diagnosis not present

## 2016-11-28 DIAGNOSIS — M47812 Spondylosis without myelopathy or radiculopathy, cervical region: Secondary | ICD-10-CM | POA: Diagnosis not present

## 2016-11-28 DIAGNOSIS — M546 Pain in thoracic spine: Secondary | ICD-10-CM | POA: Diagnosis not present

## 2016-11-28 DIAGNOSIS — M47816 Spondylosis without myelopathy or radiculopathy, lumbar region: Secondary | ICD-10-CM | POA: Diagnosis not present

## 2016-12-03 ENCOUNTER — Telehealth (HOSPITAL_COMMUNITY): Payer: Self-pay | Admitting: *Deleted

## 2016-12-03 NOTE — Telephone Encounter (Signed)
Message sent to provider 

## 2016-12-03 NOTE — Telephone Encounter (Signed)
Pt pharmacy requesting refills for pt Xanax via e-scribe. Pt do not have f/u appt with provider. Per pt chart, she's been coming for her therapist appt Q2W. Per pt chart on 10-28-2016, RN spoke with pt and informed pt that if pharmacy requested refills pt would have to resch appt first before meds could be refilled. Per pt chart, RN stated pt verbalized understanding but did not resch an appt with Dr. Harrington Challenger. Per pt chart, pt had an appt with Dr. Harrington Challenger for 10-08-2016 but pt called and cancelled appt via voicemail. Pt pharmacy number is [phone].

## 2016-12-04 NOTE — Telephone Encounter (Signed)
Spoke with pt and she rescheduled f/u with provider.

## 2016-12-04 NOTE — Telephone Encounter (Signed)
You may call in 30 day supply but she needs appt

## 2016-12-05 ENCOUNTER — Other Ambulatory Visit (HOSPITAL_COMMUNITY): Payer: Self-pay | Admitting: Psychiatry

## 2016-12-05 MED ORDER — LAMOTRIGINE 100 MG PO TABS: ORAL_TABLET | ORAL | 2 refills | Status: DC

## 2016-12-09 DIAGNOSIS — M9902 Segmental and somatic dysfunction of thoracic region: Secondary | ICD-10-CM | POA: Diagnosis not present

## 2016-12-09 DIAGNOSIS — M546 Pain in thoracic spine: Secondary | ICD-10-CM | POA: Diagnosis not present

## 2016-12-09 DIAGNOSIS — M47816 Spondylosis without myelopathy or radiculopathy, lumbar region: Secondary | ICD-10-CM | POA: Diagnosis not present

## 2016-12-09 DIAGNOSIS — M9901 Segmental and somatic dysfunction of cervical region: Secondary | ICD-10-CM | POA: Diagnosis not present

## 2016-12-09 DIAGNOSIS — M531 Cervicobrachial syndrome: Secondary | ICD-10-CM | POA: Diagnosis not present

## 2016-12-09 DIAGNOSIS — M9903 Segmental and somatic dysfunction of lumbar region: Secondary | ICD-10-CM | POA: Diagnosis not present

## 2016-12-09 DIAGNOSIS — M47812 Spondylosis without myelopathy or radiculopathy, cervical region: Secondary | ICD-10-CM | POA: Diagnosis not present

## 2016-12-11 ENCOUNTER — Encounter (INDEPENDENT_AMBULATORY_CARE_PROVIDER_SITE_OTHER): Payer: Self-pay

## 2016-12-11 ENCOUNTER — Encounter (HOSPITAL_COMMUNITY): Payer: Self-pay | Admitting: Psychiatry

## 2016-12-11 ENCOUNTER — Ambulatory Visit (INDEPENDENT_AMBULATORY_CARE_PROVIDER_SITE_OTHER): Payer: Medicare Other | Admitting: Psychiatry

## 2016-12-11 ENCOUNTER — Other Ambulatory Visit (HOSPITAL_COMMUNITY): Payer: Self-pay | Admitting: Oncology

## 2016-12-11 ENCOUNTER — Telehealth (HOSPITAL_COMMUNITY): Payer: Self-pay | Admitting: *Deleted

## 2016-12-11 DIAGNOSIS — C541 Malignant neoplasm of endometrium: Secondary | ICD-10-CM

## 2016-12-11 DIAGNOSIS — F411 Generalized anxiety disorder: Secondary | ICD-10-CM

## 2016-12-11 MED ORDER — FENTANYL 75 MCG/HR TD PT72: 75.0000 ug | MEDICATED_PATCH | TRANSDERMAL | 0 refills | Status: DC

## 2016-12-11 NOTE — Progress Notes (Signed)
  Patient:  Yesenia Reynolds   DOB: 10-Aug-1941  MR Number: [id number]  Location: [facility]:  [address] [place],  [place], [address]  Start:  Thursday 12/11/2016 10:20 AM   End:  Thursday 12/11/2016 11:05 AM                    Provider/Observer:     Maurice Small, MSW, LCSW   Chief Complaint:      Chief Complaint  Patient presents with  . Depression  . Anxiety    Reason For Service:     Yesenia Reynolds is a 76 year old female who presents with symptoms of anxiety that began after she had treatment for cancer in 2016. Prior to treatment, patient reports being very independent and outgoing. After treatment, she became fearful of staying alone, driving, and being away from home. She reports excessive worry and says she developed shakiness. She also reports nervousness and decreased social involvement.  Interventions Strategy:  Supportive       Participation Level:   Active  Participation Quality:  Appropriate      Behavioral Observation:  Casual, Alert,  Anxious   Current Psychosocial Factors: Housemate has multiple health issues and  was admitted to rehab facility after recent hospitalization, decreased independence as patient is fearful of driving, , stressful relationship with sister,  Content of Session:   reviewed symptoms,facilitated expression of feelings, assisted patient identify changes in her life for the past several months and normalized feelings regarding this, reviewed relaxation techniques assisted patient identify coping statements to intervene in spiraling negative thoughts, assisted patient identify ways to improve self-care especially regarding sleep hygiene and rest, Assisted patient identify ways to use support system, reviewed back burner technique to reduce worry about issues beyond patient's control   Current Status:   Anxiety, excessive worry, muscle tension,   Suicidal/Homicidal:    No  Patient Progress:   Fair. Patient reports increased  anxiety since last session. She reports increased sleep difficulty and continued worry about her future. She also worries about her housemate who remains in a rehabilitation facility. She expresses frustration regarding roommate's refusal  to talk about plans for future care. Patient remains very tired and overwhelmed with going to the rehabilitation facility daily to sit with her friend but fears being at home by herself. She continues to have people stay with her at night but this is expensive. This week, she plans to stay with a friend,   Target Goals:   1. Learn and implement calming skills to reduce/manage overall symptoms of anxiety    2. Undergo gradual imaginal  exposure to driving and staying home alone        3. Learn and implement personal and interpersonal skills to reduce anxiety and improve interpersonal relationships  Last Reviewed:   09/02/2016  Goals Addressed Today:    1  Plan:                   Return again in 2 weeks. Patient agrees to implement strategies discussed in session and practice relaxation technique daily.  Impression/Diagnosis:  Patient presents with symptoms of anxiety that began after she had treatment for cancer in 2016. She reports excessive worry, motor tension, and social withdrawal.  She worries about a variety of issues.      Diagnosis:  Axis I: Generalized anxiety disorder          Axis II: Deferred   Yesenia Lees, LCSW 12/11/2016

## 2016-12-11 NOTE — Telephone Encounter (Signed)
Printed.  TK 

## 2016-12-12 DIAGNOSIS — Z6826 Body mass index (BMI) 26.0-26.9, adult: Secondary | ICD-10-CM | POA: Diagnosis not present

## 2016-12-12 DIAGNOSIS — J329 Chronic sinusitis, unspecified: Secondary | ICD-10-CM | POA: Diagnosis not present

## 2016-12-12 DIAGNOSIS — C55 Malignant neoplasm of uterus, part unspecified: Secondary | ICD-10-CM | POA: Diagnosis not present

## 2016-12-12 DIAGNOSIS — Z299 Encounter for prophylactic measures, unspecified: Secondary | ICD-10-CM | POA: Diagnosis not present

## 2016-12-12 DIAGNOSIS — E1165 Type 2 diabetes mellitus with hyperglycemia: Secondary | ICD-10-CM | POA: Diagnosis not present

## 2016-12-12 DIAGNOSIS — Z789 Other specified health status: Secondary | ICD-10-CM | POA: Diagnosis not present

## 2016-12-17 ENCOUNTER — Encounter (HOSPITAL_COMMUNITY): Payer: Self-pay | Admitting: Psychiatry

## 2016-12-17 ENCOUNTER — Ambulatory Visit (INDEPENDENT_AMBULATORY_CARE_PROVIDER_SITE_OTHER): Payer: Medicare Other | Admitting: Psychiatry

## 2016-12-17 VITALS — BP 165/72 | HR 81 | Ht 62.0 in | Wt 144.0 lb

## 2016-12-17 DIAGNOSIS — Z888 Allergy status to other drugs, medicaments and biological substances status: Secondary | ICD-10-CM

## 2016-12-17 DIAGNOSIS — F411 Generalized anxiety disorder: Secondary | ICD-10-CM

## 2016-12-17 DIAGNOSIS — Z8249 Family history of ischemic heart disease and other diseases of the circulatory system: Secondary | ICD-10-CM

## 2016-12-17 DIAGNOSIS — Z823 Family history of stroke: Secondary | ICD-10-CM | POA: Diagnosis not present

## 2016-12-17 DIAGNOSIS — Z79899 Other long term (current) drug therapy: Secondary | ICD-10-CM

## 2016-12-17 DIAGNOSIS — Z7982 Long term (current) use of aspirin: Secondary | ICD-10-CM

## 2016-12-17 DIAGNOSIS — Z9889 Other specified postprocedural states: Secondary | ICD-10-CM

## 2016-12-17 MED ORDER — ESCITALOPRAM OXALATE 10 MG PO TABS: 10.0000 mg | ORAL_TABLET | Freq: Every day | ORAL | 2 refills | Status: DC

## 2016-12-17 MED ORDER — CLONAZEPAM 0.5 MG PO TABS: 0.5000 mg | ORAL_TABLET | Freq: Four times a day (QID) | ORAL | 2 refills | Status: DC

## 2016-12-17 MED ORDER — LAMOTRIGINE 100 MG PO TABS: ORAL_TABLET | ORAL | 2 refills | Status: DC

## 2016-12-17 NOTE — Progress Notes (Signed)
Psychiatric Initial Adult Assessment   Patient Identification: Yesenia Reynolds MRN:  [id number] Date of Evaluation:  12/17/2016 Referral Source: Dr. Abran Duke, oncologist at [facility] health Chief Complaint:   Chief Complaint    Depression; Anxiety; Follow-up     Visit Diagnosis:    ICD-9-CM ICD-10-CM   1. Generalized anxiety disorder 300.02 F41.1     History of Present Illness:  This patient is a 76 year old single white female who lives with a female housemate in [place]. 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  The patient was referred by her oncologist for further treatment and assessment of severe anxiety.  The patient states that she has had no prior psychiatric assessment or treatment. She began to have endometrial bleeding in April 2016. She was found to have endometrial cancer and underwent hysterectomy. Following that she had 2 bouts of chemotherapy followed by radiation and eventually intravaginal radiation. She states that she finished all these treatments in October of 2016. She states that she did very well throughout the treatments and stayed calm and did not have significant problems with nausea and vomiting.  In November 2016 she began to have significant problems with anxiety. She didn't want anyone coming around and visiting her she felt extremely anxious being around people are going out to restaurants or to church. She is been a very gregarious person all her life and her personality totally changed. She denied being depressed sad or crying. She was shaking all the time and couldn't stay alone. She still can't stay alone. She stopped driving her car. She gave up things she enjoyed like going to her nephew's baseball games. She also could not watch anything frightening like police shows on TV because she would become extremely anxious. She is eating fairly well but lost a good deal of weight during her treatments. She is sleeping well  and denies nightmares. She states that she had short-term memory loss shortly after treatment but this is gotten much better.  She was tried on Paxil but it was not helpful. Her doctor has her on Xanax 0.25 mg which does help but she takes it as needed. Her gynecologist put her on Lamictal and she is up to 150 mg a day. She thinks this is helped more than anything else because she is less anxious than she was and is slowly starting to drive a little bit and be around a few people at a time. Her hand still shake at times. She would like to get out and do all the things she used to do such as drive on her own go to baseball games enjoy her friends and church but she is beginning to realize that this is going to be a slow process. She denies suicidal ideation or auditory or visual hallucinations  The patient returns after 4 months. She has missed some appointments. She states that she is getting worse rather than better. The last several weeks she's been extremely anxious and shaky. She still not able to drive or go to [place] to see her nephews ball games. She feels more worried and depressed. She was somewhat tearful today. She states that her roommate has been in the hospital for liver cirrhosis and her best friend moved away and these things have been overwhelming. She still having some trouble with short-term memory. I suggested that we will retry another antidepressant as she's had bad luck with both Prozac and Paxil. I suggested a  low dose of Lexapro along with the Lamictal. The Xanax is not seeming to help her anxiety anymore so we will switch to clonazepam   Associated Signs/Symptoms: Depression Symptoms:  psychomotor agitation, difficulty concentrating, panic attacks,  Anxiety Symptoms:  Agoraphobia, Excessive Worry, Panic Symptoms, Social Anxiety,   Past Psychiatric History: none  Previous Psychotropic Medications: Yes   Substance Abuse History in the last 12 months:   No.  Consequences of Substance Abuse: NA  Past Medical History:  Past Medical History:  Diagnosis Date  . Agoraphobia with panic attacks 08/07/2016  . Arthritis   . Cancer ([place])   . Carotid artery disease ([facility])    Less than 50% ICA stenoses 6/13  . Endometrial cancer, FIGO stage IIIC ([place]) 08/08/2015  . Essential hypertension, benign   . GERD (gastroesophageal reflux disease)   . Hypothyroidism   . Mixed hyperlipidemia   . Palpitations   . Spinal stenosis   . Spinal stenosis of lumbar region at multiple levels 08/07/2016  . Type 2 diabetes mellitus ([place])     Past Surgical History:  Procedure Laterality Date  . ABDOMINAL HYSTERECTOMY    . BREAST BIOPSY     benign  . LIPOMA EXCISION    . Multiple dental extractions    . TONSILECTOMY, ADENOIDECTOMY, BILATERAL MYRINGOTOMY AND TUBES    . TOTAL KNEE ARTHROPLASTY  08/04/2012   Procedure: TOTAL KNEE ARTHROPLASTY;  Surgeon: Gearlean Alf, MD;  Location: [facility];  Service: Orthopedics;  Laterality: Left;    Family Psychiatric History: none  Family History:  Family History  Problem Relation Age of Onset  . Stroke Mother   . Heart failure Mother   . Hypertension Father   . Coronary artery disease Father     Social History:   Social History   Social History  . Marital status: Single    Spouse name: N/A  . Number of children: N/A  . Years of education: N/A   Social History Main Topics  . Smoking status: Never Smoker  . Smokeless tobacco: Never Used  . Alcohol use No     Comment: 07/10/2016 per pt no  . Drug use: No     Comment: 8/3/2017per pt no   . Sexual activity: No   Other Topics Concern  . None   Social History Narrative  . None    Additional Social History: The patient grew up in [place] with both parents and one younger sister. She states that she had an excellent childhood with no history of trauma or abuse. 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 She is never married and has lived with the same friend for 40 years. As a high school student and a younger person she was very active in sports such as softball and basketball and still very much enjoys watching the sports  Allergies:   Allergies  Allergen Reactions  . Morphine And Related Other (See Comments)    Hypotension  . Paxil [Paroxetine Hcl]     Metabolic Disorder Labs: No results found for: HGBA1C, MPG No results found for: PROLACTIN No results found for: CHOL, TRIG, HDL, CHOLHDL, VLDL, LDLCALC   Current Medications: Current Outpatient Prescriptions  Medication Sig Dispense Refill  . aspirin 81 MG tablet Take 81 mg by mouth daily.    Marland Kitchen atorvastatin (LIPITOR) 10 MG tablet Take 10 mg by mouth every evening.     . fentaNYL (DURAGESIC - DOSED  MCG/HR) 75 MCG/HR Place 1 patch (75 mcg total) onto the skin every 3 (three) days. 10 patch 0  . ibuprofen (ADVIL,MOTRIN) 200 MG tablet Take 200 mg by mouth every 6 (six) hours as needed.    . lamoTRIgine (LAMICTAL) 100 MG tablet Taking 0.5 Tablets in AM and 1 Tablet in PM 60 tablet 2  . latanoprost (XALATAN) 0.005 % ophthalmic solution Place 1 drop into both eyes at bedtime.    Marland Kitchen levothyroxine (SYNTHROID, LEVOTHROID) 88 MCG tablet Take 88 mcg by mouth daily before breakfast.     . lidocaine-prilocaine (EMLA) cream Apply 1 application topically as needed. With chemo    . lisinopril (PRINIVIL,ZESTRIL) 10 MG tablet Take 10 mg by mouth every morning.     . loratadine (CLARITIN) 10 MG tablet Take 10 mg by mouth daily as needed for allergies.    . magnesium oxide (MAG-OX) 400 MG tablet Take 400 mg by mouth 2 (two) times daily.    . metoprolol succinate (TOPROL-XL) 25 MG 24 hr tablet TAKE 1/2 TABLET BY MOUTH EVERY MORNING (PATIENT NEEDS OFFICE VISIT PER MD) 45 tablet 3  . ondansetron (ZOFRAN) 8 MG tablet Take 8 mg by mouth as needed.    . polyethylene glycol (MIRALAX / GLYCOLAX) packet Take 17 g by mouth daily as needed.     .  prochlorperazine (COMPAZINE) 10 MG tablet Take 10 mg by mouth as needed.    . clonazePAM (KLONOPIN) 0.5 MG tablet Take 1 tablet (0.5 mg total) by mouth 4 (four) times daily. 120 tablet 2  . escitalopram (LEXAPRO) 10 MG tablet Take 1 tablet (10 mg total) by mouth daily. 30 tablet 2   No current facility-administered medications for this visit.     Neurologic: Headache: No Seizure: No Paresthesias:No  Musculoskeletal: Strength & Muscle Tone: within normal limits Gait & Station: unsteady Patient leans: N/A  Psychiatric Specialty Exam: Review of Systems  Musculoskeletal: Positive for back pain.  Psychiatric/Behavioral: The patient is nervous/anxious.   All other systems reviewed and are negative.   Blood pressure (!) 165/72, pulse 81, height 5\' 2"  (1.575 m), weight 144 lb (65.3 kg).Body mass index is 26.34 kg/m.  General Appearance: Casual, Neat and Well Groomed walking with a walker   Eye Contact:  Good  Speech:  Clear and Coherent  Volume:  Normal  Mood:  Anxious visibly shaky   Affect:  Dysphoric, somewhat tearful   Thought Process:  Goal Directed  Orientation:  Full (Time, Place, and Person)  Thought Content:  Rumination  Suicidal Thoughts:  No  Homicidal Thoughts:  No  Memory:  Immediate;   Good Recent;   Good Remote;   Good  Judgement:  Fair  Insight:  Good  Psychomotor Activity:  Normal  Concentration:  Concentration: Good and Attention Span: Fair  Recall:  Good  Fund of Knowledge:Good  Language: Good  Akathisia:  No  Handed:  Right  AIMS (if indicated):    Assets:  Communication Skills Desire for Improvement Resilience Social Support Talents/Skills  ADL's:  Intact  Cognition: WNL  Sleep:  ok    Treatment Plan Summary: Medication management   The patient will continue Lamictal 50 mg in the morning and 100 mg at bedtime. She'll Add Lexapro 10 mg daily. She'll discontinue Xanax and start clonazepam 0.5 mg 4 times a day. She'll return to see me in 4 weeks  and we'll continue her counseling   [name], MD 1/10/20183:44 PM

## 2016-12-22 ENCOUNTER — Encounter (HOSPITAL_COMMUNITY): Payer: Medicare Other | Attending: Hematology & Oncology

## 2016-12-22 ENCOUNTER — Ambulatory Visit (INDEPENDENT_AMBULATORY_CARE_PROVIDER_SITE_OTHER): Payer: Medicare Other | Admitting: Psychiatry

## 2016-12-22 ENCOUNTER — Encounter (HOSPITAL_COMMUNITY): Payer: Self-pay | Admitting: Psychiatry

## 2016-12-22 ENCOUNTER — Ambulatory Visit (HOSPITAL_COMMUNITY): Payer: Self-pay | Admitting: Psychiatry

## 2016-12-22 ENCOUNTER — Telehealth (HOSPITAL_COMMUNITY): Payer: Self-pay | Admitting: *Deleted

## 2016-12-22 VITALS — BP 148/55 | HR 66 | Temp 99.3°F | Resp 16

## 2016-12-22 DIAGNOSIS — Z452 Encounter for adjustment and management of vascular access device: Secondary | ICD-10-CM | POA: Diagnosis present

## 2016-12-22 DIAGNOSIS — C541 Malignant neoplasm of endometrium: Secondary | ICD-10-CM

## 2016-12-22 DIAGNOSIS — F411 Generalized anxiety disorder: Secondary | ICD-10-CM

## 2016-12-22 DIAGNOSIS — Z95828 Presence of other vascular implants and grafts: Secondary | ICD-10-CM

## 2016-12-22 MED ORDER — HEPARIN SOD (PORK) LOCK FLUSH 100 UNIT/ML IV SOLN
INTRAVENOUS | Status: AC
  Filled 2016-12-22: qty 5

## 2016-12-22 MED ORDER — SODIUM CHLORIDE 0.9% FLUSH
10.0000 mL | INTRAVENOUS | Status: DC | PRN
Start: 2016-12-22 — End: 2016-12-22
  Administered 2016-12-22: 10 mL via INTRAVENOUS
  Filled 2016-12-22: qty 10

## 2016-12-22 MED ORDER — HEPARIN SOD (PORK) LOCK FLUSH 100 UNIT/ML IV SOLN
500.0000 [IU] | Freq: Once | INTRAVENOUS | Status: AC
  Administered 2016-12-22: 500 [IU] via INTRAVENOUS

## 2016-12-22 NOTE — Telephone Encounter (Signed)
Pt came into office to see another provider today. Per Maurice Small, pt informed her that she is no longer taking her Lexapro. Per pt chart, pt medication was last filled on 12-17-16. Per pt, Dr. Harrington Challenger did not mentioned to start this medication. Per pt she just handed her the Klonopin and didn't mention the Lexapro. Informed pt that she was to start the Lexapro 5 days ago. Per pt she started her Klonopin 5 days ago. Pt then stated that the Lexapro directions was not given to her that day either. Informed pt with Lexapro directions and pt agreed to start tomorrow. Per pt it's just another medication she have to start now.

## 2016-12-22 NOTE — Progress Notes (Signed)
  Patient:  Yesenia Reynolds   DOB: 1941/07/23  MR Number: [id number]  Location: [facility]:  [address] [place],  [place], [address]  Start:  Monday 12/22/2016 2:08 PM  End:  Monday 12/22/2016 2:54 PM               Provider/Observer:     Maurice Small, MSW, LCSW   Chief Complaint:      Chief Complaint  Patient presents with  . Anxiety    Reason For Service:     [name] is a 76 year old female who presents with symptoms of anxiety that began after she had treatment for cancer in 2016. Prior to treatment, patient reports being very independent and outgoing. After treatment, she became fearful of staying alone, driving, and being away from home. She reports excessive worry and says she developed shakiness. She also reports nervousness and decreased social involvement.  Interventions Strategy:  Supportive       Participation Level:   Active  Participation Quality:  Appropriate      Behavioral Observation:  Casual, Alert,  Anxious   Current Psychosocial Factors: Housemate has multiple health issues and  was admitted to rehab facility after recent hospitalization, decreased independence as patient is fearful of driving, , stressful relationship with sister,  Content of Session:   reviewed symptoms,facilitated expression of feelings, assisted patient identify areas she can change,  assisted patient identify steps to increase time staying at home alone including developing a schedule for being at home, developing activities to do at home, and identifying relaxation activities and coping statements to manage anxiety while at home alone   Current Status:   Anxiety, excessive worry, muscle tension,   Suicidal/Homicidal:    No  Patient Progress:   Fair. Patient reports feeling a little better since last session. However, she continues to express frustration about her housemate's reluctance to leave nursing home. She continues to fear being home alone but expresses  desire to began to start trying to stay at home alone during the day. She continues to worry about her future.ncreased anxiety since last session. She reports increased sleep difficulty and continued worry about her future.   Target Goals:   1. Learn and implement calming skills to reduce/manage overall symptoms of anxiety    2. Undergo gradual imaginal  exposure to driving and staying home alone        3. Learn and implement personal and interpersonal skills to reduce anxiety and improve interpersonal relationships  Last Reviewed:   09/02/2016  Goals Addressed Today:    1,2  Plan:                   Return again in 2 weeks. Patient agrees to implement strategies discussed in session and practice relaxation technique daily.  Impression/Diagnosis:  Patient presents with symptoms of anxiety that began after she had treatment for cancer in 2016. She reports excessive worry, motor tension, and social withdrawal.  She worries about a variety of issues.      Diagnosis:  Axis I: Generalized anxiety disorder          Axis II: Deferred   [name], LCSW 12/22/2016

## 2016-12-22 NOTE — Progress Notes (Signed)
Yesenia Reynolds presented for Portacath access and flush. Portacath located in right chest wall accessed with  H 20 needle.  Good blood return present. Portacath flushed with 79ml NS and 500U/49ml Heparin and needle removed intact.  Procedure tolerated well and without incident. Patient left facility ambulatory with walker accompanied by sister.

## 2016-12-22 NOTE — Patient Instructions (Signed)
[place] at [facility] Discharge Instructions  RECOMMENDATIONS MADE BY THE CONSULTANT AND ANY TEST RESULTS WILL BE SENT TO YOUR REFERRING PHYSICIAN.  Your port was flushed today. Keep follow up appointments as scheduled and call for any problems.  Thank you for choosing [place] at [facility] to provide your oncology and hematology care.  To afford each patient quality time with our provider, please arrive at least 15 minutes before your scheduled appointment time.    If you have a lab appointment with the [facility] please come in thru the  Main Entrance and check in at the main information desk  You need to re-schedule your appointment should you arrive 10 or more minutes late.  We strive to give you quality time with our providers, and arriving late affects you and other patients whose appointments are after yours.  Also, if you no show three or more times for appointments you may be dismissed from the clinic at the providers discretion.     Again, thank you for choosing [facility].  Our hope is that these requests will decrease the amount of time that you wait before being seen by our physicians.       _____________________________________________________________  Should you have questions after your visit to [facility], please contact our office at [phone] between the hours of 8:30 a.m. and 4:30 p.m.  Voicemails left after 4:30 p.m. will not be returned until the following business day.  For prescription refill requests, have your pharmacy contact our office.       Resources For Cancer Patients and their Caregivers ? American Cancer Society: Can assist with transportation, wigs, general needs, runs Look Good Feel Better.        936 352 8826 ? Cancer Care: Provides financial assistance, online support groups, medication/co-pay assistance.  1-800-813-HOPE 619-220-7488) ? [place] Assists [place] cancer patients and their families through emotional , educational and financial support.  559-763-5074 ? [place] DSS Where to apply for food stamps, Medicaid and utility assistance. (520)002-1511 ? RCATS: Transportation to medical appointments. 930 328 1237 ? Social Security Administration: May apply for disability if have a Stage IV cancer. 343 693 8779 515-177-3527 ? [employer], Disability and Transit Services: Assists with nutrition, care and transit needs. [place] Support Programs: @10RELATIVEDAYS @ > Cancer Support Group  2nd Tuesday of the month 1pm-2pm, Journey Room  > Creative Journey  3rd Tuesday of the month 1130am-1pm, Journey Room  > Look Good Feel Better  1st Wednesday of the month 10am-12 noon, Journey Room (Call [place] to register [phone])

## 2016-12-22 NOTE — Telephone Encounter (Signed)
noted 

## 2016-12-23 ENCOUNTER — Encounter (HOSPITAL_COMMUNITY): Payer: Self-pay

## 2016-12-26 ENCOUNTER — Telehealth (HOSPITAL_COMMUNITY): Payer: Self-pay | Admitting: *Deleted

## 2016-12-26 NOTE — Telephone Encounter (Signed)
voice message left on 12/24/16.   Patient said her prescription had not been received at [facility].

## 2016-12-29 NOTE — Telephone Encounter (Signed)
Called pt due previous message. Per pt the medication she was calling about was her Lexapro but the pharmacy finally got it. Per pt when she took it she got a bit dizzy. Per pt she took it in the morning with food. Per pt, she will give the medication a bit more time to see if those feeling subsides.

## 2017-01-08 ENCOUNTER — Encounter (HOSPITAL_COMMUNITY): Payer: Self-pay | Admitting: Psychiatry

## 2017-01-08 ENCOUNTER — Ambulatory Visit (INDEPENDENT_AMBULATORY_CARE_PROVIDER_SITE_OTHER): Payer: Medicare Other | Admitting: Psychiatry

## 2017-01-08 DIAGNOSIS — F411 Generalized anxiety disorder: Secondary | ICD-10-CM | POA: Diagnosis not present

## 2017-01-08 NOTE — Progress Notes (Signed)
Patient:  Yesenia Reynolds   DOB: [birth year]  MR Number: [id number]  Location: [place]:  [address] [place],  [place], [address]  Start:  Thursday 01/08/2017 11:00 AM End:  Thursday 01/08/2017 11:51 AM            Provider/Observer:     Maurice Small, MSW, LCSW   Chief Complaint:      Chief Complaint  Patient presents with  . Anxiety    Reason For Service:     [name] is a 76 year old female who presents with symptoms of anxiety that began after she had treatment for cancer in 2016. Prior to treatment, patient reports being very independent and outgoing. After treatment, she became fearful of staying alone, driving, and being away from home. She reports excessive worry and says she developed shakiness. She also reports nervousness and decreased social involvement.  Interventions Strategy:  Supportive       Participation Level:   Active  Participation Quality:  Appropriate      Behavioral Observation:  Casual, Alert, pleasant, talkative  Current Psychosocial Factors: Patient recently moved in to a senior living community, Housemate has multiple health issues and  was admitted to rehab facility after recent hospitalization, decreased independence as patient is fearful of driving and living alone,  stressful relationship with sister,  Content of Session:   reviewed symptoms,facilitated expression of feelings about new living situation, discussed advantages and disadvantages of situation, normalized feelings of sadness and loss regarding leaving her home temporarily, assisted patient identify ways to use support system and distracting activities to cope, assisted patient identify coping statements   Current Status:   Decreased anxiety, decreased excessive worry, decreased  muscle tension,   Suicidal/Homicidal:    No  Patient Progress:   Good. Patient reports feeling much better since last session. She moved to [facility] [place], an independent senior  living community  in [place], earlier this week. She hopes this is temporary but also has realistic expectations that she may on may not be able to return to her home. She and roommate had conversation about future plans and have decided to hire help when roommate is discharged from nursing home. Patient anticipates roommate will be discharged in 2 weeks. Patient reports adjusting well to retirement home and feels positive about retaining her independence. She also enjoys socialization at the home. She reports experiencing sadness about having to leave her home. She reports decreased anxiety and worry along with improved sleep pattern. She also has begun taking Lexapro as prescribed.   Target Goals:   1. Learn and implement calming skills to reduce/manage overall symptoms of anxiety    2. Undergo gradual imaginal  exposure to driving and staying home alone        3. Learn and implement personal and interpersonal skills to reduce anxiety and improve interpersonal relationships  Last Reviewed:   09/02/2016  Goals Addressed Today:    1,  Plan:                   Return again in 2 weeks. Patient agrees to implement strategies discussed in session.   Impression/Diagnosis:  Patient presents with symptoms of anxiety that began after she had treatment for cancer in 2016. She reports excessive worry, motor tension, and social withdrawal.  She worries about a variety of issues.      Diagnosis:  Axis I: Generalized anxiety disorder          Axis II: Deferred  [place], [place] 01/08/2017

## 2017-01-12 ENCOUNTER — Telehealth (HOSPITAL_COMMUNITY): Payer: Self-pay | Admitting: *Deleted

## 2017-01-12 ENCOUNTER — Other Ambulatory Visit (HOSPITAL_COMMUNITY): Payer: Self-pay | Admitting: Oncology

## 2017-01-12 DIAGNOSIS — C541 Malignant neoplasm of endometrium: Secondary | ICD-10-CM

## 2017-01-12 MED ORDER — FENTANYL 75 MCG/HR TD PT72: 75.0000 ug | MEDICATED_PATCH | TRANSDERMAL | 0 refills | Status: DC

## 2017-01-12 NOTE — Telephone Encounter (Signed)
Rx printed.  TK 

## 2017-01-14 ENCOUNTER — Encounter (HOSPITAL_COMMUNITY): Payer: Self-pay | Admitting: Psychiatry

## 2017-01-14 ENCOUNTER — Ambulatory Visit (INDEPENDENT_AMBULATORY_CARE_PROVIDER_SITE_OTHER): Payer: Medicare Other | Admitting: Psychiatry

## 2017-01-14 ENCOUNTER — Telehealth (HOSPITAL_COMMUNITY): Payer: Self-pay | Admitting: *Deleted

## 2017-01-14 VITALS — BP 168/69 | HR 66 | Ht 62.0 in | Wt 148.4 lb

## 2017-01-14 DIAGNOSIS — Z7982 Long term (current) use of aspirin: Secondary | ICD-10-CM | POA: Diagnosis not present

## 2017-01-14 DIAGNOSIS — Z8249 Family history of ischemic heart disease and other diseases of the circulatory system: Secondary | ICD-10-CM

## 2017-01-14 DIAGNOSIS — Z823 Family history of stroke: Secondary | ICD-10-CM

## 2017-01-14 DIAGNOSIS — Z79899 Other long term (current) drug therapy: Secondary | ICD-10-CM | POA: Diagnosis not present

## 2017-01-14 DIAGNOSIS — Z888 Allergy status to other drugs, medicaments and biological substances status: Secondary | ICD-10-CM

## 2017-01-14 DIAGNOSIS — F411 Generalized anxiety disorder: Secondary | ICD-10-CM | POA: Diagnosis not present

## 2017-01-14 DIAGNOSIS — Z9889 Other specified postprocedural states: Secondary | ICD-10-CM | POA: Diagnosis not present

## 2017-01-14 DIAGNOSIS — Z8489 Family history of other specified conditions: Secondary | ICD-10-CM

## 2017-01-14 DIAGNOSIS — Z90710 Acquired absence of both cervix and uterus: Secondary | ICD-10-CM | POA: Diagnosis not present

## 2017-01-14 NOTE — Progress Notes (Signed)
Psychiatric Initial Adult Assessment   Patient Identification: Yesenia Reynolds MRN:  [id number] Date of Evaluation:  01/14/2017 Referral Source: Dr. Abran Duke, oncologist at [facility] Chief Complaint:   Chief Complaint    Depression; Anxiety; Follow-up     Visit Diagnosis:    ICD-9-CM ICD-10-CM   1. Generalized anxiety disorder 300.02 F41.1     History of Present Illness:  This patient is a 76 year old single white female who lives with a female housemate in [place]. 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  The patient was referred by her oncologist for further treatment and assessment of severe anxiety.  The patient states that she has had no prior psychiatric assessment or treatment. She began to have endometrial bleeding in April 2016. She was found to have endometrial cancer and underwent hysterectomy. Following that she had 2 bouts of chemotherapy followed by radiation and eventually intravaginal radiation. She states that she finished all these treatments in October of 2016. She states that she did very well throughout the treatments and stayed calm and did not have significant problems with nausea and vomiting.  In November 2016 she began to have significant problems with anxiety. She didn't want anyone coming around and visiting her she felt extremely anxious being around people are going out to restaurants or to church. She is been a very gregarious person all her life and her personality totally changed. She denied being depressed sad or crying. She was shaking all the time and couldn't stay alone. She still can't stay alone. She stopped driving her car. She gave up things she enjoyed like going to her nephew's baseball games. She also could not watch anything frightening like police shows on TV because she would become extremely anxious. She is eating fairly well but lost a good deal of weight during her treatments. She is sleeping well  and denies nightmares. She states that she had short-term memory loss shortly after treatment but this is gotten much better.  She was tried on Paxil but it was not helpful. Her doctor has her on Xanax 0.25 mg which does help but she takes it as needed. Her gynecologist put her on Lamictal and she is up to 150 mg a day. She thinks this is helped more than anything else because she is less anxious than she was and is slowly starting to drive a little bit and be around a few people at a time. Her hand still shake at times. She would like to get out and do all the things she used to do such as drive on her own go to baseball games enjoy her friends and church but she is beginning to realize that this is going to be a slow process. She denies suicidal ideation or auditory or visual hallucinations  The patient returns after about a month. About 2 weeks ago her gynecologist called me and stated that the clonazepam in addition to fentanyl was not working for her and she seemed more confused and was stumbling. He decided to help her get placed in [facility] assisted living at least temporarily she doesn't really like being there but wasn't functioning well at home because her roommate is still in a rehabilitation facility. She had a lot of difficulty being alone. The gynecologist is slowly tapering her off the clonazepam but she continues on Lexapro 10 mg as well as the Lamictal is doing pretty well and she looks less anxious and shaky  Associated Signs/Symptoms: Depression Symptoms:  psychomotor agitation, difficulty concentrating, panic attacks,  Anxiety Symptoms:  Agoraphobia, Excessive Worry, Panic Symptoms, Social Anxiety,   Past Psychiatric History: none  Previous Psychotropic Medications: Yes   Substance Abuse History in the last 12 months:  No.  Consequences of Substance Abuse: NA  Past Medical History:  Past Medical History:  Diagnosis Date  . Agoraphobia with panic attacks 08/07/2016   . Arthritis   . Cancer ([place])   . Carotid artery disease (HCC)    Less than 50% ICA stenoses 6/13  . Endometrial cancer, FIGO stage IIIC ([place]) 08/08/2015  . Essential hypertension, benign   . GERD (gastroesophageal reflux disease)   . Hypothyroidism   . Mixed hyperlipidemia   . Palpitations   . Spinal stenosis   . Spinal stenosis of lumbar region at multiple levels 08/07/2016  . Type 2 diabetes mellitus ([place])     Past Surgical History:  Procedure Laterality Date  . ABDOMINAL HYSTERECTOMY    . BREAST BIOPSY     benign  . LIPOMA EXCISION    . Multiple dental extractions    . TONSILECTOMY, ADENOIDECTOMY, BILATERAL MYRINGOTOMY AND TUBES    . TOTAL KNEE ARTHROPLASTY  08/04/2012   Procedure: TOTAL KNEE ARTHROPLASTY;  Surgeon: Gearlean Alf, MD;  Location: [facility];  Service: Orthopedics;  Laterality: Left;    Family Psychiatric History: none  Family History:  Family History  Problem Relation Age of Onset  . Stroke Mother   . Heart failure Mother   . Hypertension Father   . Coronary artery disease Father     Social History:   Social History   Social History  . Marital status: Single    Spouse name: N/A  . Number of children: N/A  . Years of education: N/A   Social History Main Topics  . Smoking status: Never Smoker  . Smokeless tobacco: Never Used  . Alcohol use No     Comment: 07/10/2016 per pt no  . Drug use: No     Comment: 8/3/2017per pt no   . Sexual activity: No   Other Topics Concern  . None   Social History Narrative  . None    Additional Social History: The patient grew up in [place] with both parents and one younger sister. She states that she had an excellent childhood with no history of trauma or abuse. 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 She is never married and has lived with the same friend for 40 years. As a high school student and a younger person she was very active in sports such  as softball and basketball and still very much enjoys watching the sports  Allergies:   Allergies  Allergen Reactions  . Morphine And Related Other (See Comments)    Hypotension  . Paxil [Paroxetine Hcl]     Metabolic Disorder Labs: No results found for: HGBA1C, MPG No results found for: PROLACTIN No results found for: CHOL, TRIG, HDL, CHOLHDL, VLDL, LDLCALC   Current Medications: Current Outpatient Prescriptions  Medication Sig Dispense Refill  . aspirin 81 MG tablet Take 81 mg by mouth daily.    Marland Kitchen atorvastatin (LIPITOR) 10 MG tablet Take 10 mg by mouth every evening.     . clonazePAM (KLONOPIN) 0.5 MG tablet Take 1 tablet (0.5 mg total) by mouth 4 (four) times daily. 120 tablet 2  . escitalopram (LEXAPRO) 10 MG tablet Take 1 tablet (  10 mg total) by mouth daily. 30 tablet 2  . fentaNYL (DURAGESIC - DOSED MCG/HR) 75 MCG/HR Place 1 patch (75 mcg total) onto the skin every 3 (three) days. 10 patch 0  . ibuprofen (ADVIL,MOTRIN) 200 MG tablet Take 200 mg by mouth every 6 (six) hours as needed.    . lamoTRIgine (LAMICTAL) 100 MG tablet Taking 0.5 Tablets in AM and 1 Tablet in PM 60 tablet 2  . latanoprost (XALATAN) 0.005 % ophthalmic solution Place 1 drop into both eyes at bedtime.    Marland Kitchen levothyroxine (SYNTHROID, LEVOTHROID) 88 MCG tablet Take 88 mcg by mouth daily before breakfast.     . lidocaine-prilocaine (EMLA) cream Apply 1 application topically as needed. With chemo    . lisinopril (PRINIVIL,ZESTRIL) 10 MG tablet Take 10 mg by mouth every morning.     . loratadine (CLARITIN) 10 MG tablet Take 10 mg by mouth daily as needed for allergies.    . magnesium oxide (MAG-OX) 400 MG tablet Take 400 mg by mouth 2 (two) times daily.    . metoprolol succinate (TOPROL-XL) 25 MG 24 hr tablet TAKE 1/2 TABLET BY MOUTH EVERY MORNING (PATIENT NEEDS OFFICE VISIT PER MD) 45 tablet 3  . ondansetron (ZOFRAN) 8 MG tablet Take 8 mg by mouth as needed.    . polyethylene glycol (MIRALAX / GLYCOLAX) packet  Take 17 g by mouth daily as needed.     . prochlorperazine (COMPAZINE) 10 MG tablet Take 10 mg by mouth as needed.     No current facility-administered medications for this visit.     Neurologic: Headache: No Seizure: No Paresthesias:No  Musculoskeletal: Strength & Muscle Tone: within normal limits Gait & Station: unsteady Patient leans: N/A  Psychiatric Specialty Exam: Review of Systems  Musculoskeletal: Positive for back pain.  Psychiatric/Behavioral: The patient is nervous/anxious.   All other systems reviewed and are negative.   Blood pressure (!) 168/69, pulse 66, height 5\' 2"  (1.575 m), weight 148 lb 6.4 oz (67.3 kg).Body mass index is 27.14 kg/m.  General Appearance: Casual, Neat and Well Groomed walking with a walker   Eye Contact:  Good  Speech:  Clear and Coherent  Volume:  Normal  Mood:  Anxious but improved   Affect: Fairly bright   Thought Process:  Goal Directed  Orientation:  Full (Time, Place, and Person)  Thought Content:  Rumination  Suicidal Thoughts:  No  Homicidal Thoughts:  No  Memory:  Immediate;   Good Recent;   Good Remote;   Good  Judgement:  Fair  Insight:  Good  Psychomotor Activity:  Normal  Concentration:  Concentration: Good and Attention Span: Fair  Recall:  Good  Fund of Knowledge:Good  Language: Good  Akathisia:  No  Handed:  Right  AIMS (if indicated):    Assets:  Communication Skills Desire for Improvement Resilience Social Support Talents/Skills  ADL's:  Intact  Cognition: WNL  Sleep:  ok    Treatment Plan Summary: Medication management   The patient will continue Lamictal 50 mg in the morning and 100 mg at bedtime. She'llContinue Lexapro 10 mg daily. She'll continue the clonazepam taper She'll return to see me in 6 weeks and we'll continue her counseling   [name], MD 2/7/201810:46 AM Patient ID: [name], female   DOB: 1941/04/25, 76 y.o.   MRN: [id number]

## 2017-01-14 NOTE — Telephone Encounter (Signed)
LEFT VOICE MESSAGE, PROVIDER OUT OF OFFICE 02/04/17.

## 2017-01-21 ENCOUNTER — Ambulatory Visit (INDEPENDENT_AMBULATORY_CARE_PROVIDER_SITE_OTHER): Payer: Medicare Other | Admitting: Psychiatry

## 2017-01-21 ENCOUNTER — Encounter (HOSPITAL_COMMUNITY): Payer: Self-pay | Admitting: Psychiatry

## 2017-01-21 DIAGNOSIS — F411 Generalized anxiety disorder: Secondary | ICD-10-CM | POA: Diagnosis not present

## 2017-01-21 NOTE — Progress Notes (Signed)
  Patient:  Yesenia Reynolds   DOB: May 03, 1941  MR Number: [id number]  Location: [place]:  [address] [place],  [place], [address]  Start:  Wednesday 01/21/2017 11:10 AM End:  Wednesday 01/21/2017 11:56 AM            Provider/Observer:     Maurice Small, MSW, LCSW   Chief Complaint:      No chief complaint on file.   Reason For Service:     MAURICE HALEY is a 76 year old female who presents with symptoms of anxiety that began after she had treatment for cancer in 2016. Prior to treatment, patient reports being very independent and outgoing. After treatment, she became fearful of staying alone, driving, and being away from home. She reports excessive worry and says she developed shakiness. She also reports nervousness and decreased social involvement.  Interventions Strategy:  Supportive       Participation Level:   Active  Participation Quality:  Appropriate      Behavioral Observation:  Casual, Alert, pleasant, talkative  Current Psychosocial Factors: Patient recently moved in to a senior living community, Housemate has multiple health issues and  was admitted to rehab facility after recent hospitalization, decreased independence as patient is fearful of driving and living alone,  stressful   relationship with sister,  Content of Session:   reviewed symptoms,facilitated expression of feelings about new living situation, assisted patient identify coping statements, discuss ways patient can increased social contact at the retirement home as well as her support system outside of the home.   Current Status:   Decreased anxiety, decreased excessive worry, decreased  muscle tension,   Suicidal/Homicidal:    No  Patient Progress:   Good. Patient reports decreased anxiety and worry since last session. She doesn't like some aspects of her current living situation but expresses increased acceptance of her current placement. Although she wants to return to her home, she  expresses less worry about this. Plans remain for she and her roommate to return to their home wants her roommate is discharged from the nursing home. She reports seldom leaving the retirement home at self for medical appointments but says she is okay with staying there. She reports reading, watching TV, and sometimes talking to other residents. She also talks with people on the phone. Patient also reports improved interaction with her sister as his sister remains very supportive.  .   Target Goals:   1. Learn and implement calming skills to reduce/manage overall symptoms of anxiety    2. Undergo gradual imaginal  exposure to driving and staying home alone        3. Learn and implement personal and interpersonal skills to reduce anxiety and improve interpersonal relationships  Last Reviewed:   09/02/2016  Goals Addressed Today:    1,3  Plan:                   Return again in 2 weeks. Patient agrees to implement strategies discussed in session.   Impression/Diagnosis:  Patient presents with symptoms of anxiety that began after she had treatment for cancer in 2016. She reports excessive worry, motor tension, and social withdrawal.  She worries about a variety of issues.      Diagnosis:  Axis I: Generalized anxiety disorder          Axis II: Deferred   Emberly Tomasso, LCSW 01/21/2017

## 2017-01-29 ENCOUNTER — Encounter (HOSPITAL_COMMUNITY): Payer: Medicare Other

## 2017-01-29 ENCOUNTER — Encounter (HOSPITAL_COMMUNITY): Payer: Medicare Other | Attending: Oncology | Admitting: Oncology

## 2017-01-29 ENCOUNTER — Encounter (HOSPITAL_COMMUNITY): Payer: Self-pay

## 2017-01-29 VITALS — BP 170/50 | HR 69 | Temp 98.8°F | Resp 20 | Wt 153.3 lb

## 2017-01-29 DIAGNOSIS — F4001 Agoraphobia with panic disorder: Secondary | ICD-10-CM | POA: Diagnosis not present

## 2017-01-29 DIAGNOSIS — C541 Malignant neoplasm of endometrium: Secondary | ICD-10-CM | POA: Diagnosis not present

## 2017-01-29 DIAGNOSIS — Z95828 Presence of other vascular implants and grafts: Secondary | ICD-10-CM

## 2017-01-29 LAB — COMPREHENSIVE METABOLIC PANEL
ALBUMIN: 3.7 g/dL (ref 3.5–5.0)
ALK PHOS: 89 U/L (ref 38–126)
ALT: 22 U/L (ref 14–54)
AST: 19 U/L (ref 15–41)
Anion gap: 5 (ref 5–15)
BUN: 15 mg/dL (ref 6–20)
CALCIUM: 8.7 mg/dL — AB (ref 8.9–10.3)
CO2: 32 mmol/L (ref 22–32)
CREATININE: 0.73 mg/dL (ref 0.44–1.00)
Chloride: 100 mmol/L — ABNORMAL LOW (ref 101–111)
GFR calc Af Amer: >60 mL/min (ref >60)
GFR calc non Af Amer: >60 mL/min (ref >60)
GLUCOSE: 99 mg/dL (ref 65–99)
Potassium: 4.4 mmol/L (ref 3.5–5.1)
SODIUM: 137 mmol/L (ref 135–145)
Total Bilirubin: 0.4 mg/dL (ref 0.3–1.2)
Total Protein: 5.9 g/dL — ABNORMAL LOW (ref 6.5–8.1)

## 2017-01-29 LAB — CBC WITH DIFFERENTIAL/PLATELET
BASOS PCT: 0 %
Basophils Absolute: 0 10*3/uL (ref 0.0–0.1)
EOS ABS: 0.1 10*3/uL (ref 0.0–0.7)
Eosinophils Relative: 2 %
HEMATOCRIT: 34.7 % — AB (ref 36.0–46.0)
Hemoglobin: 11.3 g/dL — ABNORMAL LOW (ref 12.0–15.0)
Lymphocytes Relative: 18 %
Lymphs Abs: 0.8 10*3/uL (ref 0.7–4.0)
MCH: 29.2 pg (ref 26.0–34.0)
MCHC: 32.6 g/dL (ref 30.0–36.0)
MCV: 89.7 fL (ref 78.0–100.0)
MONO ABS: 0.4 10*3/uL (ref 0.1–1.0)
MONOS PCT: 10 %
Neutro Abs: 3 10*3/uL (ref 1.7–7.7)
Neutrophils Relative %: 70 %
Platelets: 166 10*3/uL (ref 150–400)
RBC: 3.87 MIL/uL (ref 3.87–5.11)
RDW: 13.3 % (ref 11.5–15.5)
WBC: 4.3 10*3/uL (ref 4.0–10.5)

## 2017-01-29 MED ORDER — HEPARIN SOD (PORK) LOCK FLUSH 100 UNIT/ML IV SOLN
500.0000 [IU] | Freq: Once | INTRAVENOUS | Status: AC
  Administered 2017-01-29: 500 [IU] via INTRAVENOUS

## 2017-01-29 MED ORDER — SODIUM CHLORIDE 0.9% FLUSH
10.0000 mL | INTRAVENOUS | Status: DC | PRN
  Administered 2017-01-29: 10 mL via INTRAVENOUS
  Filled 2017-01-29: qty 10

## 2017-01-29 NOTE — Progress Notes (Signed)
Yesenia Reynolds presented for Portacath access and flush. Portacath located right chest wall accessed with  H 20 needle. Good blood return present. Portacath flushed with 43ml NS and 500U/47ml Heparin and needle removed intact. Procedure without incident. Patient tolerated procedure well. Labs done also.

## 2017-01-29 NOTE — Patient Instructions (Signed)
[facility] at [facility] Discharge Instructions  RECOMMENDATIONS MADE BY THE CONSULTANT AND ANY TEST RESULTS WILL BE SENT TO YOUR REFERRING PHYSICIAN.  Port flush done with labs Follow up as scheduled.  Thank you for choosing [facility] at [facility] to provide your oncology and hematology care.  To afford each patient quality time with our provider, please arrive at least 15 minutes before your scheduled appointment time.    If you have a lab appointment with the Cancer Center please come in thru the  Main Entrance and check in at the main information desk  You need to re-schedule your appointment should you arrive 10 or more minutes late.  We strive to give you quality time with our providers, and arriving late affects you and other patients whose appointments are after yours.  Also, if you no show three or more times for appointments you may be dismissed from the clinic at the providers discretion.     Again, thank you for choosing [facility].  Our hope is that these requests will decrease the amount of time that you wait before being seen by our physicians.       _____________________________________________________________  Should you have questions after your visit to [facility], please contact our office at [phone] between the hours of 8:30 a.m. and 4:30 p.m.  Voicemails left after 4:30 p.m. will not be returned until the following business day.  For prescription refill requests, have your pharmacy contact our office.       Resources For Cancer Patients and their Caregivers ? American Cancer Society: Can assist with transportation, wigs, general needs, runs Look Good Feel Better.        1-888-227-6333 ? Cancer Care: Provides financial assistance, online support groups, medication/co-pay assistance.  1-800-813-HOPE (4673) ? [facility] Assists [place] cancer patients  and their families through emotional , educational and financial support.  336-427-4357 ? [place] DSS Where to apply for food stamps, Medicaid and utility assistance. 336-342-1394 ? RCATS: Transportation to medical appointments. 336-347-2287 ? Social Security Administration: May apply for disability if have a Stage IV cancer. 336-342-7796 1-800-772-1213 ? [place] Aging, Disability and Transit Services: Assists with nutrition, care and transit needs. 336-349-2343  Cancer Center Support Programs: @10RELATIVEDAYS@ > Cancer Support Group  2nd Tuesday of the month 1pm-2pm, Journey Room  > Creative Journey  3rd Tuesday of the month 1130am-1pm, Journey Room  > Look Good Feel Better  1st Wednesday of the month 10am-12 noon, Journey Room (Call American Cancer Society to register 1-800-395-5775)   

## 2017-01-29 NOTE — Progress Notes (Signed)
[facility] Hematology/Oncology Progress Note  Name: Yesenia Reynolds      MRN: [id number]    Date: 01/29/2017 Time:11:50 AM   REFERRING PHYSICIAN:  Everardo All, MD (Medical Oncology at [facility])  [place]:  Transfer of medical oncology care.   DIAGNOSIS:  Stage IIIC1, Grade 2, endometrial cancer, treated at [facility] under the care of Dr. Tressie Stalker.  S/P systemic chemotherapy consisting Carboplatin/Paclitaxel x 3 cycles followed by EBRT (06/27/2015- 07/31/2015), then 3 more cycles of chemotherapy (04/26/2015- 10/03/2015) with vaginal cuff intracavitary brachytherapy (08/08/2015, 08/14/2015, and 08/21/2015).    Endometrial cancer ([place])   02/16/2015 Initial Diagnosis    Endometrial cancer ([place]).  The endometrial cancer found on D&C, grade 2 by Dr. Barrie Dunker.      03/30/2015 Surgery    Robotic hysterectomy, bilateral salpingo-oophorectomy, sentinel lymph node mapping and resection, mini-laparotomy by Dr. Clenton Pare.      04/02/2015 Cancer Staging    Stage IIIc 1, grade 2      04/26/2015 - 10/03/2015 Chemotherapy    The patient had palonosetron (ALOXI) injection 0.25 mg, 0.25 mg, Intravenous,  Once, 6 of 6 cycles  CARBOplatin (PARAPLATIN) in sodium chloride 0.9 % 100 mL chemo infusion, , Intravenous,  Once, 6 of 6 cycles  PACLitaxel (TAXOL) 294 mg in dextrose 5 % 250 mL chemo infusion (> 80mg /m2), 175 mg/m2 = 294 mg, Intravenous,  Once, 6 of 6 cycles  for chemotherapy treatment.        06/27/2015 - 07/31/2015 Radiation Therapy    External beam radiation therapy      08/08/2015 - 08/21/2015 Radiation Therapy    Vaginal cuff intracavitary brachytherapy delivered, August 31, September 6, 08/21/2015. 18 gray in 3 fractions.      02/20/2016 Imaging    CT Abd/Pelvis- no evidence of metastatic disease status post hysterectomy. Improving fluid collection along the right pelvic sidewall, probably postoperative lymphocele or seroma.       HISTORY OF PRESENT ILLNESS:     Yesenia Reynolds is a 76 y.o. female with a medical history significant for endometrial cancer, agoraphobia, severe spinal stenosis, CAD, HTN, GAD, hyperlipidemia who is referred to the [facility] for transfer of ongoing medical oncology care for Stage IIIC1, Grade 2, endometrial cancer, treated at [facility] under the care of Dr. Tressie Stalker.  S/P systemic chemotherapy consisting Carboplatin/Paclitaxel x 3 cycles followed by EBRT (06/27/2015- 07/31/2015), then 3 more cycles of chemotherapy (04/26/2015- 10/03/2015) with vaginal cuff intracavitary brachytherapy (08/08/2015, 08/14/2015, and 08/21/2015).  Ms. Plueger returns to the [place] accompanied by her sister for follow up. She uses a walker to ambulate.   States she has been feeling pretty good but has been feeling a little tired, but she attributes that to being lazy. She is currently in a retirement home. She notes that she gets some social activity at the retirement home such as eating together and doing activities.   She states her appetite is well.   Denies chest pain, sob, weakness, abdominal pain, bloating, fatigue, weight loss, and diarrhea.    Her sister states that she still doesn't go out anywhere or leave her house alone.     Review of Systems  Constitutional: Negative.  Negative for malaise/fatigue and weight loss.       Appetite is well. Notes she has been feeling a little tired, but attributes it to being lazy and not doing much at her retirement inn.  HENT: Negative.   Eyes: Negative.  Respiratory: Negative.  Negative for shortness of breath.   Cardiovascular: Negative.  Negative for chest pain.  Gastrointestinal: Negative.  Negative for abdominal pain and diarrhea.       Denies bloating  Genitourinary: Negative.   Musculoskeletal: Negative.   Skin: Negative.   Neurological: Negative.  Negative for weakness.  Endo/Heme/Allergies: Negative.   Psychiatric/Behavioral: Negative.   All other systems reviewed and are  negative. 14 point review of systems was performed and is negative except as detailed under history of present illness and above   PAST MEDICAL HISTORY:   Past Medical History:  Diagnosis Date  . Agoraphobia with panic attacks 08/07/2016  . Arthritis   . Cancer ([place])   . Carotid artery disease (HCC)    Less than 50% ICA stenoses 6/13  . Endometrial cancer, FIGO stage IIIC ([place]) 08/08/2015  . Essential hypertension, benign   . GERD (gastroesophageal reflux disease)   . Hypothyroidism   . Mixed hyperlipidemia   . Palpitations   . Spinal stenosis   . Spinal stenosis of lumbar region at multiple levels 08/07/2016  . Type 2 diabetes mellitus (HCC)     ALLERGIES: Allergies  Allergen Reactions  . Morphine And Related Other (See Comments)    Hypotension  . Paxil [Paroxetine Hcl]       MEDICATIONS: I have reviewed the patient's current medications.    Current Outpatient Prescriptions on File Prior to Visit  Medication Sig Dispense Refill  . aspirin 81 MG tablet Take 81 mg by mouth daily.    Marland Kitchen atorvastatin (LIPITOR) 10 MG tablet Take 10 mg by mouth every evening.     . clonazePAM (KLONOPIN) 0.5 MG tablet Take 1 tablet (0.5 mg total) by mouth 4 (four) times daily. 120 tablet 2  . escitalopram (LEXAPRO) 10 MG tablet Take 1 tablet (10 mg total) by mouth daily. 30 tablet 2  . fentaNYL (DURAGESIC - DOSED MCG/HR) 75 MCG/HR Place 1 patch (75 mcg total) onto the skin every 3 (three) days. 10 patch 0  . ibuprofen (ADVIL,MOTRIN) 200 MG tablet Take 200 mg by mouth every 6 (six) hours as needed.    . lamoTRIgine (LAMICTAL) 100 MG tablet Taking 0.5 Tablets in AM and 1 Tablet in PM 60 tablet 2  . latanoprost (XALATAN) 0.005 % ophthalmic solution Place 1 drop into both eyes at bedtime.    Marland Kitchen levothyroxine (SYNTHROID, LEVOTHROID) 88 MCG tablet Take 88 mcg by mouth daily before breakfast.     . lidocaine-prilocaine (EMLA) cream Apply 1 application topically as needed. With chemo    . lisinopril  (PRINIVIL,ZESTRIL) 10 MG tablet Take 10 mg by mouth every morning.     . loratadine (CLARITIN) 10 MG tablet Take 10 mg by mouth daily as needed for allergies.    . magnesium oxide (MAG-OX) 400 MG tablet Take 400 mg by mouth 2 (two) times daily.    . metoprolol succinate (TOPROL-XL) 25 MG 24 hr tablet TAKE 1/2 TABLET BY MOUTH EVERY MORNING (PATIENT NEEDS OFFICE VISIT PER MD) 45 tablet 3  . ondansetron (ZOFRAN) 8 MG tablet Take 8 mg by mouth as needed.    . polyethylene glycol (MIRALAX / GLYCOLAX) packet Take 17 g by mouth daily as needed.     . prochlorperazine (COMPAZINE) 10 MG tablet Take 10 mg by mouth as needed.     No current facility-administered medications on file prior to visit.      PAST SURGICAL HISTORY Past Surgical History:  Procedure Laterality Date  . ABDOMINAL  HYSTERECTOMY    . BREAST BIOPSY     benign  . LIPOMA EXCISION    . Multiple dental extractions    . TONSILECTOMY, ADENOIDECTOMY, BILATERAL MYRINGOTOMY AND TUBES    . TOTAL KNEE ARTHROPLASTY  08/04/2012   Procedure: TOTAL KNEE ARTHROPLASTY;  Surgeon: Gearlean Alf, MD;  Location: [facility];  Service: Orthopedics;  Laterality: Left;    FAMILY HISTORY: Family History  Problem Relation Age of Onset  . Stroke Mother   . Heart failure Mother   . Hypertension Father   . Coronary artery disease Father   Maternal uncle deceased of colon cancer, age of diagnosis unknown but felt to be greater than 32 2. Maternal aunt deceased of stomach cancer, age of diagnosis unknown but again felt to be greater than 19 at diagnosis.   SOCIAL HISTORY:  reports that she has never smoked. She has never used smokeless tobacco. She reports that she does not drink alcohol or use drugs.  Social History   Social History  . Marital status: Single    Spouse name: N/A  . Number of children: N/A  . Years of education: N/A   Social History Main Topics  . Smoking status: Never Smoker  . Smokeless tobacco: Never Used  . Alcohol use No      Comment: 07/10/2016 per pt no  . Drug use: No     Comment: 8/3/2017per pt no   . Sexual activity: No   Other Topics Concern  . Not on file   Social History Narrative  . No narrative on file  The patient grew up in [place] with both parents and one younger sister. She states that she had an excellent childhood with no history of trauma or abuse. 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 She is never married and has lived with the same friend for 40 years. As a high school student and a younger person she was very active in sports such as softball and basketball and still very much enjoys watching the sports  PERFORMANCE STATUS: The patient's performance status is 1 - Symptomatic but completely ambulatory  PHYSICAL EXAM:  Vitals:   01/29/17 1149  BP: (!) 170/50  Pulse: 69  Resp: 20  Temp: 98.8 F (37.1 C)   Filed Weights   01/29/17 1149  Weight: 153 lb 4.8 oz (69.5 kg)     Physical Exam  Constitutional: She is oriented to person, place, and time and well-developed, well-nourished, and in no distress.  Uses a walker to ambulate.  HENT:  Head: Normocephalic and atraumatic.  Mouth/Throat: Oropharynx is clear and moist.  Eyes: Conjunctivae and EOM are normal. Pupils are equal, round, and reactive to light.  Neck: Normal range of motion. Neck supple.  Cardiovascular: Normal rate, regular rhythm and normal heart sounds.   Pulmonary/Chest: Effort normal and breath sounds normal.  Abdominal: Soft. Bowel sounds are normal.  Musculoskeletal: Normal range of motion.  Neurological: She is alert and oriented to person, place, and time. Gait normal.  Skin: Skin is warm and dry.  Nursing note and vitals reviewed.   LABORATORY DATA:  I have reviewed the data as listed. CBC    Component Value Date/Time   WBC 4.0 10/28/2016 1209   RBC 4.02 10/28/2016 1209   HGB 11.5 (L) 10/28/2016 1209   HCT 36.4 10/28/2016 1209   PLT  178 10/28/2016 1209   MCV 90.5 10/28/2016 1209  MCH 28.6 10/28/2016 1209   MCHC 31.6 10/28/2016 1209   RDW 13.2 10/28/2016 1209   LYMPHSABS 0.6 (L) 10/28/2016 1209   MONOABS 0.3 10/28/2016 1209   EOSABS 0.1 10/28/2016 1209   BASOSABS 0.0 10/28/2016 1209     Chemistry      Component Value Date/Time   NA 138 10/28/2016 1209   K 4.0 10/28/2016 1209   CL 102 10/28/2016 1209   CO2 30 10/28/2016 1209   BUN 16 10/28/2016 1209   CREATININE 0.73 10/28/2016 1209      Component Value Date/Time   CALCIUM 9.2 10/28/2016 1209   ALKPHOS 126 10/28/2016 1209   AST 28 10/28/2016 1209   ALT 30 10/28/2016 1209   BILITOT 0.5 10/28/2016 1209     Lab Results  Component Value Date   CA125 9.6 10/28/2016    RADIOGRAPHY: I have personally reviewed the radiological images as listed and agreed with the findings in the report.  No results found.          PATHOLOGY:  N/A   ASSESSMENT/PLAN:   Endometrial cancer  Stage IIIC1, Grade 2, endometrial cancer, treated at [facility] under the care of Dr. Tressie Stalker.  S/P systemic chemotherapy consisting Carboplatin/Paclitaxel x 3 cycles followed by EBRT (06/27/2015- 07/31/2015), then 3 more cycles of chemotherapy (04/26/2015- 10/03/2015) with vaginal cuff intracavitary brachytherapy (08/08/2015, 08/14/2015, and 08/21/2015).  Clinically NED on exam. CA-125 has been stable. Port flush today with labs CBC, CMP, CA 125.  NCCN guidelines pertaining to surveillance of endometrial cancer illustrates the following surveillance guidelines (2.2017):  A. Physical exam every 3-6 months for 2-3 years and then every 6 months or annually.  B. CA-125 (optional) if elevated initially.  C. Imaging as clinically indicated  D. Consider genetic counseling/testing for patients (<57 years old) and those with a significant family history of endometrial and/or colorectal cancer and/or selected pathologic risk features.  E. Patient education regarding symptoms of potential recurrence,  lifestyle, obesity, exercise, and nutrition.  F. Patient education regarding sexual health, vaginal dilator use, and vaginal lubricants/moisturizers.   Agoraphobia with panic attacks Agoraphobia with panic attacks, suspected to be secondary to malignancy diagnosis and treatment based upon history. Following with Dr. Harrington Challenger, she was encouraged today to continue with Dr. Harrington Challenger. I have encouraged patient to go out and be more social. I reassured her that as of now there is no evidence of recurrence and she should feel free to return to her pre-diagnosis life and levels of activity.   RTC in 6 months for follow up with repeat labs, CBC, CMP, and CA-125.  Marland Kitchen    ORDERS PLACED FOR THIS ENCOUNTER: Orders Placed This Encounter  Procedures  . CBC with Differential  . Comprehensive metabolic panel  . CA 125  . CBC with Differential  . Comprehensive metabolic panel  . CA 125    MEDICATIONS PRESCRIBED THIS ENCOUNTER: No orders of the defined types were placed in this encounter.   All questions were answered. The patient knows to call the clinic with any problems, questions or concerns. We can certainly see the patient much sooner if necessary.  This document serves as a record of services personally performed by Twana First, MD. It was created on her behalf by Shirlean Mylar, a trained medical scribe. The creation of this record is based on the scribe's personal observations and the provider's statements to them. This document has been checked and approved by the attending provider.  I have reviewed the above documentation for accuracy and  completeness and I agree with the above.  This note is electronically signed [id number] Serita Grit  01/29/2017 11:50 AM

## 2017-01-29 NOTE — Patient Instructions (Signed)
[facility] at [facility] Discharge Instructions  RECOMMENDATIONS MADE BY THE CONSULTANT AND ANY TEST RESULTS WILL BE SENT TO YOUR REFERRING PHYSICIAN.  You were seen today by Dr. Louise Zhou Follow up in 6 months with labs See Amy up front for appointments   Thank you for choosing [facility] at [facility] to provide your oncology and hematology care.  To afford each patient quality time with our provider, please arrive at least 15 minutes before your scheduled appointment time.    If you have a lab appointment with the Cancer Center please come in thru the  Main Entrance and check in at the main information desk  You need to re-schedule your appointment should you arrive 10 or more minutes late.  We strive to give you quality time with our providers, and arriving late affects you and other patients whose appointments are after yours.  Also, if you no show three or more times for appointments you may be dismissed from the clinic at the providers discretion.     Again, thank you for choosing [facility].  Our hope is that these requests will decrease the amount of time that you wait before being seen by our physicians.       _____________________________________________________________  Should you have questions after your visit to [facility], please contact our office at [phone] between the hours of 8:30 a.m. and 4:30 p.m.  Voicemails left after 4:30 p.m. will not be returned until the following business day.  For prescription refill requests, have your pharmacy contact our office.       Resources For Cancer Patients and their Caregivers ? American Cancer Society: Can assist with transportation, wigs, general needs, runs Look Good Feel Better.        1-888-227-6333 ? Cancer Care: Provides financial assistance, online support groups, medication/co-pay assistance.  1-800-813-HOPE (4673) ? [facility] Assists [place] cancer patients and their families through emotional , educational and financial support.  336-427-4357 ? [place] DSS Where to apply for food stamps, Medicaid and utility assistance. 336-342-1394 ? [facility]: Transportation to medical appointments. 336-347-2287 ? Social Security Administration: May apply for disability if have a Stage IV cancer. 336-342-7796 1-800-772-1213 ? [place] Aging, Disability and Transit Services: Assists with nutrition, care and transit needs. 336-349-2343  Cancer Center Support Programs: @10RELATIVEDAYS@ > Cancer Support Group  2nd Tuesday of the month 1pm-2pm, Journey Room  > Creative Journey  3rd Tuesday of the month 1130am-1pm, Journey Room  > Look Good Feel Better  1st Wednesday of the month 10am-12 noon, Journey Room (Call American Cancer Society to register 1-800-395-5775)    

## 2017-01-30 LAB — CA 125: CA 125: 9.8 U/mL (ref 0.0–38.1)

## 2017-02-04 ENCOUNTER — Ambulatory Visit (HOSPITAL_COMMUNITY): Payer: Self-pay | Admitting: Psychiatry

## 2017-02-10 DIAGNOSIS — H52203 Unspecified astigmatism, bilateral: Secondary | ICD-10-CM | POA: Diagnosis not present

## 2017-02-10 DIAGNOSIS — H401131 Primary open-angle glaucoma, bilateral, mild stage: Secondary | ICD-10-CM | POA: Diagnosis not present

## 2017-02-10 DIAGNOSIS — H5213 Myopia, bilateral: Secondary | ICD-10-CM | POA: Diagnosis not present

## 2017-02-10 DIAGNOSIS — H524 Presbyopia: Secondary | ICD-10-CM | POA: Diagnosis not present

## 2017-02-10 DIAGNOSIS — H2513 Age-related nuclear cataract, bilateral: Secondary | ICD-10-CM | POA: Diagnosis not present

## 2017-02-11 ENCOUNTER — Ambulatory Visit (INDEPENDENT_AMBULATORY_CARE_PROVIDER_SITE_OTHER): Payer: Medicare Other | Admitting: Psychiatry

## 2017-02-11 ENCOUNTER — Encounter (HOSPITAL_COMMUNITY): Payer: Self-pay | Admitting: Psychiatry

## 2017-02-11 DIAGNOSIS — F411 Generalized anxiety disorder: Secondary | ICD-10-CM

## 2017-02-11 NOTE — Progress Notes (Signed)
  Patient:  Yesenia Reynolds   DOB: 01/20/1941  MR Number: [id number]  Location: [place]:  [address] [place],  [place], [address]  Start:    Wednesday 02/10/2017 11:16 AM  End:  Wednesday 02/10/2017  12:00 PM        Provider/Observer:     Maurice Small, MSW, LCSW   Chief Complaint:      Chief Complaint  Patient presents with  . Anxiety    Reason For Service:     [name] is a 76 year old female who presents with symptoms of anxiety that began after she had treatment for cancer in 2016. Prior to treatment, patient reports being very independent and outgoing. After treatment, she became fearful of staying alone, driving, and being away from home. She reports excessive worry and says she developed shakiness. She also reports nervousness and decreased social involvement.  Interventions Strategy:  Supportive       Participation Level:   Active  Participation Quality:  Appropriate      Behavioral Observation:  Casual, Alert, pleasant, talkative  Current Psychosocial Factors: Patient recently moved in to a senior living community, Housemate has multiple health issues and  was admitted to rehab facility after recent hospitalization, decreased independence as patient is fearful of driving and living alone,  stressful   relationship with sister,  Content of Session:   reviewed symptoms,facilitated expression of feelings about situation with housemate, reviewed and revised treatment plan, developed stepdown plan   Current Status:   Decreased anxiety, decreased excessive worry, decreased  muscle tension,   Suicidal/Homicidal:    No  Patient Progress:   Good. Patient reports continued decreased anxiety and worry since last session. She sxpresses increased acceptance of her current residential placement. She reports improved mood and being content with her current level of activity and social involvement. She also expresses less worry about housemate remaining in the  nursing home. She reports doing well now but expresses some worry about how she will manage when her sister goes out of town for 3 weeks in April. .   Target Goals:   1. Learn and implement relapse prevention strategies for managing possible future anxiety symptoms  Last Reviewed:   02/11/2017  Goals Addressed Today:    1  Plan:                   Return again in 2 weeks. Patient agrees to implement strategies discussed in session.   Impression/Diagnosis:  Patient presents with symptoms of anxiety that began after she had treatment for cancer in 2016. She reports excessive worry, motor tension, and social withdrawal.  She worries about a variety of issues.      Diagnosis:  Axis I: Generalized anxiety disorder          Axis II: Deferred   Cathy Crounse, LCSW 02/11/2017

## 2017-02-12 DIAGNOSIS — C541 Malignant neoplasm of endometrium: Secondary | ICD-10-CM | POA: Diagnosis not present

## 2017-02-12 DIAGNOSIS — Z6827 Body mass index (BMI) 27.0-27.9, adult: Secondary | ICD-10-CM | POA: Diagnosis not present

## 2017-02-23 ENCOUNTER — Other Ambulatory Visit (HOSPITAL_COMMUNITY): Payer: Self-pay | Admitting: Adult Health

## 2017-02-23 DIAGNOSIS — C541 Malignant neoplasm of endometrium: Secondary | ICD-10-CM

## 2017-02-23 MED ORDER — FENTANYL 75 MCG/HR TD PT72: 75.0000 ug | MEDICATED_PATCH | TRANSDERMAL | 0 refills | Status: DC

## 2017-02-23 NOTE — Progress Notes (Signed)
Patient called office requesting refill for Fentanyl patch.   [place] Controlled Substance Registry reviewed and refill appropriate.   Mike Craze, NP [place] [phone]

## 2017-02-24 ENCOUNTER — Ambulatory Visit (INDEPENDENT_AMBULATORY_CARE_PROVIDER_SITE_OTHER): Payer: Medicare Other | Admitting: Psychiatry

## 2017-02-24 DIAGNOSIS — F411 Generalized anxiety disorder: Secondary | ICD-10-CM

## 2017-02-24 NOTE — Progress Notes (Signed)
  Patient:  Yesenia Reynolds   DOB: January 29, 1941  MR Number: [id number]  Location: [place]:  [address] [place],  [place], [address]  Start:    Tuesday 02/24/2017 11:12 AM  End:  Tuesday 02/24/2017 11:45 AM       Provider/Observer:     Maurice Small, MSW, LCSW   Chief Complaint:      Chief Complaint  Patient presents with  . Anxiety    Reason For Service:     [name] is a 76 year old female who presents with symptoms of anxiety that began after she had treatment for cancer in 2016. Prior to treatment, patient reports being very independent and outgoing. After treatment, she became fearful of staying alone, driving, and being away from home. She reports excessive worry and says she developed shakiness. She also reports nervousness and decreased social involvement.  Interventions Strategy:  Supportive       Participation Level:   Active  Participation Quality:  Appropriate      Behavioral Observation:  Casual, Alert, nervous, restless,   Current Psychosocial Factors: Patient recently moved in to a senior living community, Housemate has multiple health issues and  was admitted to rehab facility after recent hospitalization,   Content of Session:   reviewed symptoms,facilitated expression of feelings about situation with housemate, assisted patient identify triggers of anxiety, did problem solving with patient regarding talking with housemate regarding their housing situation, discussed rationale for and practiced a mindfulness breathing activity   Current Status:   increased anxiety,  excessive worry,increased  muscle tension,   Suicidal/Homicidal:    No  Patient Progress:   Poor. Patient reports increased anxiety and worry since last session. Per her report, friends don't visit her like they once did and she is not receiving as many calls. She states disliking her current placement. She also reports housemate informed her last night she may be coming home at  the end of the month. Patient reports constant worry about how they will be able to make it in their own home in light of both of their health issues. She fears roommate will expect her to take care of things and her like she did in the past. She admits negative spiraling thoughts.  Target Goals:   1. Learn and implement relapse prevention strategies for managing possible future anxiety symptoms  Last Reviewed:   02/11/2017  Goals Addressed Today:    1  Plan:                   Return again in 2 weeks. Patient agrees to implement strategies discussed in session.   Impression/Diagnosis:  Patient presents with symptoms of anxiety that began after she had treatment for cancer in 2016. She reports excessive worry, motor tension, and social withdrawal.  She worries about a variety of issues.      Diagnosis:  Axis I: Generalized anxiety disorder          Axis II: Deferred   Marsha Gundlach, LCSW 02/24/2017

## 2017-02-25 ENCOUNTER — Encounter (HOSPITAL_COMMUNITY): Payer: Self-pay | Admitting: Psychiatry

## 2017-02-25 ENCOUNTER — Ambulatory Visit (INDEPENDENT_AMBULATORY_CARE_PROVIDER_SITE_OTHER): Payer: Medicare Other | Admitting: Psychiatry

## 2017-02-25 VITALS — BP 160/88 | Ht 62.0 in | Wt 154.0 lb

## 2017-02-25 DIAGNOSIS — F411 Generalized anxiety disorder: Secondary | ICD-10-CM

## 2017-02-25 DIAGNOSIS — Z79899 Other long term (current) drug therapy: Secondary | ICD-10-CM | POA: Diagnosis not present

## 2017-02-25 DIAGNOSIS — Z7982 Long term (current) use of aspirin: Secondary | ICD-10-CM

## 2017-02-25 DIAGNOSIS — Z888 Allergy status to other drugs, medicaments and biological substances status: Secondary | ICD-10-CM | POA: Diagnosis not present

## 2017-02-25 MED ORDER — LAMOTRIGINE 100 MG PO TABS: ORAL_TABLET | ORAL | 2 refills | Status: DC

## 2017-02-25 MED ORDER — ESCITALOPRAM OXALATE 20 MG PO TABS: 20.0000 mg | ORAL_TABLET | Freq: Every day | ORAL | 2 refills | Status: DC

## 2017-02-25 NOTE — Progress Notes (Signed)
Psychiatric Initial Adult Assessment   Patient Identification: Yesenia Reynolds MRN:  [id number] Date of Evaluation:  02/25/2017 Referral Source: Dr. Abran Duke, oncologist at [facility] health Chief Complaint:   Chief Complaint    Depression; Anxiety; Follow-up     Visit Diagnosis:    ICD-9-CM ICD-10-CM   1. Generalized anxiety disorder 300.02 F41.1     History of Present Illness:  This patient is a 76 year old single white female who lives with a female housemate in [place]. 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  The patient was referred by her oncologist for further treatment and assessment of severe anxiety.  The patient states that she has had no prior psychiatric assessment or treatment. She began to have endometrial bleeding in April 2016. She was found to have endometrial cancer and underwent hysterectomy. Following that she had 2 bouts of chemotherapy followed by radiation and eventually intravaginal radiation. She states that she finished all these treatments in October of 2016. She states that she did very well throughout the treatments and stayed calm and did not have significant problems with nausea and vomiting.  In November 2016 she began to have significant problems with anxiety. She didn't want anyone coming around and visiting her she felt extremely anxious being around people are going out to restaurants or to church. She is been a very gregarious person all her life and her personality totally changed. She denied being depressed sad or crying. She was shaking all the time and couldn't stay alone. She still can't stay alone. She stopped driving her car. She gave up things she enjoyed like going to her nephew's baseball games. She also could not watch anything frightening like police shows on TV because she would become extremely anxious. She is eating fairly well but lost a good deal of weight during her treatments. She is sleeping well  and denies nightmares. She states that she had short-term memory loss shortly after treatment but this is gotten much better.  She was tried on Paxil but it was not helpful. Her doctor has her on Xanax 0.25 mg which does help but she takes it as needed. Her gynecologist put her on Lamictal and she is up to 150 mg a day. She thinks this is helped more than anything else because she is less anxious than she was and is slowly starting to drive a little bit and be around a few people at a time. Her hand still shake at times. She would like to get out and do all the things she used to do such as drive on her own go to baseball games enjoy her friends and church but she is beginning to realize that this is going to be a slow process. She denies suicidal ideation or auditory or visual hallucinations  The patient returns after 6 weeks. I spoke with her alone and also with her sister Fraser Din. She is very anxious and worried. Her roommate wants to leave her rehabilitation center and go back to live at the [facility]. The patient has been asked to go back with her. The patient however doesn't feel ready because she still is extremely anxious to be out in the community. She can't be alone for even 5 minutes or she starts crying. She is obviously in no shape to be living with a roommate who also has significant disabilities. Her sister is supportive but doesn't want her to feel responsible for the roommate and I  agreed. She seems very depressed and anxious today and we agreed that an increase in Lexapro would be helpful as well. For now she is going to stay at the retirement home   Associated Signs/Symptoms: Depression Symptoms:  psychomotor agitation, difficulty concentrating, panic attacks,  Anxiety Symptoms:  Agoraphobia, Excessive Worry, Panic Symptoms, Social Anxiety,   Past Psychiatric History: none  Previous Psychotropic Medications: Yes   Substance Abuse History in the last 12 months:  No.  Consequences of  Substance Abuse: NA  Past Medical History:  Past Medical History:  Diagnosis Date  . Agoraphobia with panic attacks 08/07/2016  . Arthritis   . Cancer ([place])   . Carotid artery disease ([facility])    Less than 50% ICA stenoses 6/13  . Endometrial cancer, FIGO stage IIIC ([place]) 08/08/2015  . Essential hypertension, benign   . GERD (gastroesophageal reflux disease)   . Hypothyroidism   . Mixed hyperlipidemia   . Palpitations   . Spinal stenosis   . Spinal stenosis of lumbar region at multiple levels 08/07/2016  . Type 2 diabetes mellitus ([place])     Past Surgical History:  Procedure Laterality Date  . ABDOMINAL HYSTERECTOMY    . BREAST BIOPSY     benign  . LIPOMA EXCISION    . Multiple dental extractions    . TONSILECTOMY, ADENOIDECTOMY, BILATERAL MYRINGOTOMY AND TUBES    . TOTAL KNEE ARTHROPLASTY  08/04/2012   Procedure: TOTAL KNEE ARTHROPLASTY;  Surgeon: Gearlean Alf, MD;  Location: [facility];  Service: Orthopedics;  Laterality: Left;    Family Psychiatric History: none  Family History:  Family History  Problem Relation Age of Onset  . Stroke Mother   . Heart failure Mother   . Hypertension Father   . Coronary artery disease Father     Social History:   Social History   Social History  . Marital status: Single    Spouse name: N/A  . Number of children: N/A  . Years of education: N/A   Social History Main Topics  . Smoking status: Never Smoker  . Smokeless tobacco: Never Used  . Alcohol use No     Comment: 07/10/2016 per pt no  . Drug use: No     Comment: 8/3/2017per pt no   . Sexual activity: No   Other Topics Concern  . None   Social History Narrative  . None    Additional Social History: The patient grew up in [place] with both parents and one younger sister. She states that she had an excellent childhood with no history of trauma or abuse. 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 She is never  married and has lived with the same friend for 40 years. As a high school student and a younger person she was very active in sports such as softball and basketball and still very much enjoys watching the sports  Allergies:   Allergies  Allergen Reactions  . Morphine And Related Other (See Comments)    Hypotension  . Paxil [Paroxetine Hcl]     Metabolic Disorder Labs: No results found for: HGBA1C, MPG No results found for: PROLACTIN No results found for: CHOL, TRIG, HDL, CHOLHDL, VLDL, LDLCALC   Current Medications: Current Outpatient Prescriptions  Medication Sig Dispense Refill  . aspirin 81 MG tablet Take 81 mg by mouth daily.    Marland Kitchen atorvastatin (LIPITOR) 10 MG tablet Take 10 mg by mouth every evening.     Marland Kitchen  escitalopram (LEXAPRO) 10 MG tablet Take 1 tablet (10 mg total) by mouth daily. 30 tablet 2  . escitalopram (LEXAPRO) 20 MG tablet Take 1 tablet (20 mg total) by mouth daily. 30 tablet 2  . fentaNYL (DURAGESIC - DOSED MCG/HR) 75 MCG/HR Place 1 patch (75 mcg total) onto the skin every 3 (three) days. 10 patch 0  . ibuprofen (ADVIL,MOTRIN) 200 MG tablet Take 200 mg by mouth every 6 (six) hours as needed.    . lamoTRIgine (LAMICTAL) 100 MG tablet Taking 0.5 Tablets in AM and 1 Tablet in PM 60 tablet 2  . latanoprost (XALATAN) 0.005 % ophthalmic solution Place 1 drop into both eyes at bedtime.    Marland Kitchen levothyroxine (SYNTHROID, LEVOTHROID) 88 MCG tablet Take 88 mcg by mouth daily before breakfast.     . lidocaine-prilocaine (EMLA) cream Apply 1 application topically as needed. With chemo    . lisinopril (PRINIVIL,ZESTRIL) 10 MG tablet Take 10 mg by mouth every morning.     . loratadine (CLARITIN) 10 MG tablet Take 10 mg by mouth daily as needed for allergies.    . magnesium oxide (MAG-OX) 400 MG tablet Take 400 mg by mouth 2 (two) times daily.    . metoprolol succinate (TOPROL-XL) 25 MG 24 hr tablet TAKE 1/2 TABLET BY MOUTH EVERY MORNING (PATIENT NEEDS OFFICE VISIT PER MD) 45 tablet 3   . ondansetron (ZOFRAN) 8 MG tablet Take 8 mg by mouth as needed.    . polyethylene glycol (MIRALAX / GLYCOLAX) packet Take 17 g by mouth daily as needed.     . prochlorperazine (COMPAZINE) 10 MG tablet Take 10 mg by mouth as needed.     No current facility-administered medications for this visit.     Neurologic: Headache: No Seizure: No Paresthesias:No  Musculoskeletal: Strength & Muscle Tone: within normal limits Gait & Station: unsteady Patient leans: N/A  Psychiatric Specialty Exam: Review of Systems  Musculoskeletal: Positive for back pain.  Psychiatric/Behavioral: The patient is nervous/anxious.   All other systems reviewed and are negative.   Blood pressure (!) 160/88, height 5\' 2"  (1.575 m), weight 154 lb (69.9 kg).Body mass index is 28.17 kg/m.  General Appearance: Casual, Neat and Well Groomed walking with a walker   Eye Contact:  Good  Speech:  Clear and Coherent  Volume:  Normal  Mood:  Anxious   Affect: constricted and worried, hands are shaking   Thought Process:  Goal Directed  Orientation:  Full (Time, Place, and Person)  Thought Content:  Rumination  Suicidal Thoughts:  No  Homicidal Thoughts:  No  Memory:  Immediate;   Good Recent;   Good Remote;   Good  Judgement:  Fair  Insight:  Good  Psychomotor Activity:  Normal  Concentration:  Concentration: Good and Attention Span: Fair  Recall:  Good  Fund of Knowledge:Good  Language: Good  Akathisia:  No  Handed:  Right  AIMS (if indicated):    Assets:  Communication Skills Desire for Improvement Resilience Social Support Talents/Skills  ADL's:  Intact  Cognition: WNL  Sleep:  ok    Treatment Plan Summary: Medication management   The patient will continue Lamictal 50 mg in the morning and 100 mg at bedtime. She'll increase Lexapro to 20 mg daily because of depression and anxiety symptoms. Her gynecologist has her completely off clonazepam She'll return to see me in 4 weeks and we'll continue  her counseling   [name], MD 3/21/201811:02 AM Patient ID: [name], female  DOB: July 28, 1941, 76 y.o.   MRN: [id number]

## 2017-03-02 DIAGNOSIS — Z713 Dietary counseling and surveillance: Secondary | ICD-10-CM | POA: Diagnosis not present

## 2017-03-02 DIAGNOSIS — Z299 Encounter for prophylactic measures, unspecified: Secondary | ICD-10-CM | POA: Diagnosis not present

## 2017-03-02 DIAGNOSIS — E785 Hyperlipidemia, unspecified: Secondary | ICD-10-CM | POA: Diagnosis not present

## 2017-03-02 DIAGNOSIS — E1165 Type 2 diabetes mellitus with hyperglycemia: Secondary | ICD-10-CM | POA: Diagnosis not present

## 2017-03-02 DIAGNOSIS — C55 Malignant neoplasm of uterus, part unspecified: Secondary | ICD-10-CM | POA: Diagnosis not present

## 2017-03-02 DIAGNOSIS — M6281 Muscle weakness (generalized): Secondary | ICD-10-CM | POA: Diagnosis not present

## 2017-03-02 DIAGNOSIS — R2681 Unsteadiness on feet: Secondary | ICD-10-CM | POA: Diagnosis not present

## 2017-03-02 DIAGNOSIS — E663 Overweight: Secondary | ICD-10-CM | POA: Diagnosis not present

## 2017-03-02 DIAGNOSIS — E7800 Pure hypercholesterolemia, unspecified: Secondary | ICD-10-CM | POA: Diagnosis not present

## 2017-03-10 DIAGNOSIS — M4307 Spondylolysis, lumbosacral region: Secondary | ICD-10-CM | POA: Diagnosis not present

## 2017-03-10 DIAGNOSIS — M4306 Spondylolysis, lumbar region: Secondary | ICD-10-CM | POA: Diagnosis not present

## 2017-03-10 DIAGNOSIS — M47817 Spondylosis without myelopathy or radiculopathy, lumbosacral region: Secondary | ICD-10-CM | POA: Diagnosis not present

## 2017-03-11 ENCOUNTER — Ambulatory Visit (HOSPITAL_COMMUNITY): Payer: Self-pay | Admitting: Psychiatry

## 2017-03-17 DIAGNOSIS — H2513 Age-related nuclear cataract, bilateral: Secondary | ICD-10-CM | POA: Diagnosis not present

## 2017-03-19 ENCOUNTER — Encounter (HOSPITAL_COMMUNITY): Payer: Self-pay | Admitting: Psychiatry

## 2017-03-19 ENCOUNTER — Ambulatory Visit (INDEPENDENT_AMBULATORY_CARE_PROVIDER_SITE_OTHER): Payer: Medicare Other | Admitting: Psychiatry

## 2017-03-19 DIAGNOSIS — F411 Generalized anxiety disorder: Secondary | ICD-10-CM

## 2017-03-19 NOTE — Progress Notes (Signed)
  Patient:  Yesenia Reynolds   DOB: 1941/10/22  MR Number: [id number]  Location: [facility]:  [address] [place],  [place], [address]  Start:    Thursday 03/19/2017 11:10 AM  End:  Thursday 03/19/2017 11:55 AM       Provider/Observer:     Maurice Small, MSW, LCSW   Chief Complaint:      No chief complaint on file.   Reason For Service:     [name] is a 76 year old female who presents with symptoms of anxiety that began after she had treatment for cancer in 2016. Prior to treatment, patient reports being very independent and outgoing. After treatment, she became fearful of staying alone, driving, and being away from home. She reports excessive worry and says she developed shakiness. She also reports nervousness and decreased social involvement.  Interventions Strategy:  Supportive       Participation Level:   Active  Participation Quality:  Appropriate      Behavioral Observation:  Casual, Alert, nervous, restless,   Current Psychosocial Factors: Patient recently moved in to a senior living community, Housemate has multiple health issues and  was admitted to rehab facility after recent hospitalization,   Content of Session:   reviewed symptoms, praised and reinforced patient's increased activity and social involvement, discussed ways to maintain consistency, assisted patient identify effects on her mood and behavior, discussed connection between thoughts/mood/behavior, facilitated expression of feelings about situation with housemate,    Current Status:   decreased anxiety, decreased excessive worry, hands/arms continue to tremble  Suicidal/Homicidal:    No  Patient Progress:   Fair. Patient reports decreased worry since last session. She has increased her involvement in activity including walking for exercise around her current residential placement. She has made efforts to increase social contact by staying out of her room more, talking to other residents and   going to activities at the retirement home. She also has had increased contact from friends. She has become more mindful of thought patterns and tried to reframe negative thoughts with healthy alternatives. She reports decreased worry and expresses increased acceptance of roommate not making a decision about returning to their condo. Patient reports regular conversations with roommate but says she no longer asks roommate about plans of moving back home. Patient reports she may be able to afford her current residential situation ongoing. \ Target Goals:   1. Learn and implement relapse prevention strategies for managing possible future anxiety symptoms  Last Reviewed:   02/11/2017  Goals Addressed Today:    1  Plan         :                   Return again in  -32 weeks. Patient agrees to implement strategies discussed in session.   Impression/Diagnosis:  Patient presents with symptoms of anxiety that began after she had treatment for cancer in 2016. She reports excessive worry, motor tension, and social withdrawal.  She worries about a variety of issues.      Diagnosis:  Axis I: Generalized anxiety disorder          Axis II: Deferred   [name], LCSW 03/19/2017

## 2017-03-19 NOTE — Patient Instructions (Addendum)
Yesenia Reynolds  03/19/2017     @PREFPERIOPPHARMACY @   Your procedure is scheduled on 03/31/2017.  Report to [facility] at 7:30 A.M.  Call this number if you have problems the morning of surgery:  [phone]   Remember:  Do not eat food or drink liquids after midnight.  Take these medicines the morning of surgery with A SIP OF WATER : Lexapro, Lamictal, Synthroid, Lisinopril and Toprol XL   Do not wear jewelry, make-up or nail polish.  Do not wear lotions, powders, or perfumes, or deoderant.  Do not shave 48 hours prior to surgery.  Men may shave face and neck.  Do not bring valuables to the hospital.  [facility] is not responsible for any belongings or valuables.  Contacts, dentures or bridgework may not be worn into surgery.  Leave your suitcase in the car.  After surgery it may be brought to your room.  For patients admitted to the hospital, discharge time will be determined by your treatment team.  Patients discharged the day of surgery will not be allowed to drive home.   Name and phone number of your driver:   family Special instructions:  n/a  Please read over the following fact sheets that you were given. Care and Recovery After Surgery  Cataract Surgery Cataract surgery is a procedure to remove a cataract from your eye. A cataract is cloudiness on the lens of your eye. The lens focuses light inside the eye. When a lens becomes cloudy, your vision is affected. Cataract surgery is a procedure to remove the cloudy lens. A substitute lens (intraocular lens or IOL) is usually inserted as a replacement for the cloudy lens. Tell a health care provider about:  Any allergies you have.  All medicines you are taking, including vitamins, herbs, eye drops, creams, and over-the-counter medicines.  Any problems you or family members have had with anesthetic medicines.  Any blood disorders you have.  Any surgeries you have had, especially eye surgeries that include  refractive surgery, such as PRK and LASIK.  Any medical conditions you have.  Whether you are pregnant or may be pregnant. What are the risks? Generally, this is a safe procedure. However, problems may occur, including:  Infection.  Bleeding.  Glaucoma.  Retinal detachment.  Allergic reactions to medicines.  Damage to other structures or organs.  Inflammation of the eye.  Clouding of the part of your eye that holds an IOL in place (after-cataract), if an IOL was inserted. This is fairly common.  An IOL moving out of position, if an IOL was inserted. This is very rare.  Loss of vision. This is rare. What happens before the procedure?  Follow instructions from your health care provider about eating or drinking restrictions.  Ask your health care provider about:  Changing or stopping your regular medicines, including any eye drops you have been prescribed. This is especially important if you are taking diabetes medicines or blood thinners.  Taking medicines such as aspirin and ibuprofen. These medicines can thin your blood. Do not take these medicines before your procedure if your health care provider instructs you not to.  Do not put contact lenses in either eye on the day of your surgery.  Plan for someone to drive you to and from the procedure.  If you will be going home right after the procedure, plan to have someone with you for 24 hours. What happens during the procedure?  An IV tube may be inserted into  one of your veins.  You will be given one or more of the following:  A medicine to help you relax (sedative).  A medicine to numb the area (local anesthetic). This may be numbing eye drops or an injection that is given behind the eye.  A small cut (incision) will be made to the edge of the clear, dome-shaped surface that covers the front of the eye (cornea).  A small probe will be inserted into the eye. This device gives off ultrasound waves that soften and  break up the cloudy center of the lens. This makes it easier for the cloudy lens to be removed by suction.  An IOL may be implanted.  Part of the capsule that surrounds the lens will be left in the eye to support the IOL.  Your surgeon may use stitches (sutures) to close the incision. The procedure may vary among health care providers and hospitals. What happens after the procedure?  Your blood pressure, heart rate, breathing rate, and blood oxygen level will be monitored often until the medicines you were given have worn off.  You may be given a protective shield to wear over your eyes.  Do not drive for 24 hours if you received a sedative. This information is not intended to replace advice given to you by your health care provider. Make sure you discuss any questions you have with your health care provider. Document Released: 11/13/2011 Document Revised: 05/01/2016 Document Reviewed: 10/04/2015 Elsevier Interactive Patient Education  2017 [employer].

## 2017-03-23 ENCOUNTER — Ambulatory Visit (HOSPITAL_COMMUNITY): Payer: Self-pay | Admitting: Psychiatry

## 2017-03-24 ENCOUNTER — Other Ambulatory Visit (HOSPITAL_COMMUNITY): Payer: Self-pay | Admitting: Oncology

## 2017-03-24 ENCOUNTER — Ambulatory Visit (INDEPENDENT_AMBULATORY_CARE_PROVIDER_SITE_OTHER): Payer: Medicare Other | Admitting: Psychiatry

## 2017-03-24 ENCOUNTER — Encounter (HOSPITAL_COMMUNITY): Payer: Self-pay

## 2017-03-24 ENCOUNTER — Encounter (HOSPITAL_COMMUNITY): Payer: Self-pay | Admitting: Psychiatry

## 2017-03-24 ENCOUNTER — Encounter (HOSPITAL_COMMUNITY)
Admission: RE | Admit: 2017-03-24 | Discharge: 2017-03-24 | Disposition: A | Discharge status: Home / Self Care | Payer: Medicare Other | Source: Ambulatory Visit | Attending: Ophthalmology | Admitting: Ophthalmology

## 2017-03-24 ENCOUNTER — Telehealth (HOSPITAL_COMMUNITY): Payer: Self-pay | Admitting: *Deleted

## 2017-03-24 VITALS — BP 179/73 | HR 68 | Ht 62.0 in | Wt 159.2 lb

## 2017-03-24 DIAGNOSIS — Z79899 Other long term (current) drug therapy: Secondary | ICD-10-CM | POA: Diagnosis not present

## 2017-03-24 DIAGNOSIS — Z01812 Encounter for preprocedural laboratory examination: Secondary | ICD-10-CM | POA: Insufficient documentation

## 2017-03-24 DIAGNOSIS — F411 Generalized anxiety disorder: Secondary | ICD-10-CM | POA: Diagnosis not present

## 2017-03-24 DIAGNOSIS — C541 Malignant neoplasm of endometrium: Secondary | ICD-10-CM

## 2017-03-24 HISTORY — DX: Personal history of other diseases of the circulatory system: Z86.79

## 2017-03-24 HISTORY — DX: Depression, unspecified: F32.A

## 2017-03-24 HISTORY — DX: Anxiety disorder, unspecified: F41.9

## 2017-03-24 HISTORY — DX: Major depressive disorder, single episode, unspecified: F32.9

## 2017-03-24 LAB — BASIC METABOLIC PANEL
ANION GAP: 9 (ref 5–15)
BUN: 13 mg/dL (ref 6–20)
CHLORIDE: 96 mmol/L — AB (ref 101–111)
CO2: 33 mmol/L — AB (ref 22–32)
Calcium: 9 mg/dL (ref 8.9–10.3)
Creatinine, Ser: 0.75 mg/dL (ref 0.44–1.00)
GFR calc non Af Amer: >60 mL/min (ref >60)
GLUCOSE: 108 mg/dL — AB (ref 65–99)
Potassium: 4 mmol/L (ref 3.5–5.1)
Sodium: 138 mmol/L (ref 135–145)

## 2017-03-24 LAB — CBC
HEMATOCRIT: 37.8 % (ref 36.0–46.0)
HEMOGLOBIN: 12 g/dL (ref 12.0–15.0)
MCH: 29.1 pg (ref 26.0–34.0)
MCHC: 31.7 g/dL (ref 30.0–36.0)
MCV: 91.5 fL (ref 78.0–100.0)
Platelets: 191 10*3/uL (ref 150–400)
RBC: 4.13 MIL/uL (ref 3.87–5.11)
RDW: 13.4 % (ref 11.5–15.5)
WBC: 4.5 10*3/uL (ref 4.0–10.5)

## 2017-03-24 MED ORDER — FENTANYL 75 MCG/HR TD PT72: 75.0000 ug | MEDICATED_PATCH | TRANSDERMAL | 0 refills | Status: DC

## 2017-03-24 MED ORDER — LAMOTRIGINE 100 MG PO TABS: ORAL_TABLET | ORAL | 2 refills | Status: DC

## 2017-03-24 MED ORDER — ESCITALOPRAM OXALATE 20 MG PO TABS: 20.0000 mg | ORAL_TABLET | Freq: Every day | ORAL | 2 refills | Status: DC

## 2017-03-24 NOTE — Progress Notes (Signed)
Psychiatric Initial Adult Assessment   Patient Identification: YOUSRA Reynolds MRN:  [id number] Date of Evaluation:  03/24/2017 Referral Source: Dr. Abran Duke, oncologist at [facility] health Chief Complaint:   Chief Complaint    Depression; Anxiety; Follow-up     Visit Diagnosis:    ICD-9-CM ICD-10-CM   1. Generalized anxiety disorder 300.02 F41.1     History of Present Illness:  This patient is a 76 year old single white female who lives with a female housemate in [place]. 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  The patient was referred by her oncologist for further treatment and assessment of severe anxiety.  The patient states that she has had no prior psychiatric assessment or treatment. She began to have endometrial bleeding in April 2016. She was found to have endometrial cancer and underwent hysterectomy. Following that she had 2 bouts of chemotherapy followed by radiation and eventually intravaginal radiation. She states that she finished all these treatments in October of 2016. She states that she did very well throughout the treatments and stayed calm and did not have significant problems with nausea and vomiting.  In November 2016 she began to have significant problems with anxiety. She didn't want anyone coming around and visiting her she felt extremely anxious being around people are going out to restaurants or to church. She is been a very gregarious person all her life and her personality totally changed. She denied being depressed sad or crying. She was shaking all the time and couldn't stay alone. She still can't stay alone. She stopped driving her car. She gave up things she enjoyed like going to her nephew's baseball games. She also could not watch anything frightening like police shows on TV because she would become extremely anxious. She is eating fairly well but lost a good deal of weight during her treatments. She is sleeping well  and denies nightmares. She states that she had short-term memory loss shortly after treatment but this is gotten much better.  She was tried on Paxil but it was not helpful. Her doctor has her on Xanax 0.25 mg which does help but she takes it as needed. Her gynecologist put her on Lamictal and she is up to 150 mg a day. She thinks this is helped more than anything else because she is less anxious than she was and is slowly starting to drive a little bit and be around a few people at a time. Her hand still shake at times. She would like to get out and do all the things she used to do such as drive on her own go to baseball games enjoy her friends and church but she is beginning to realize that this is going to be a slow process. She denies suicidal ideation or auditory or visual hallucinations  The patient returns after 6 weeks. She is by herself today. Last time we increased her Lexapro to 20 mg daily. She thinks it is helping a little bit. She is going to physical therapy. She [facility] in talking to more people. She feels ready to try going out to eat with a friend but is certainly not ready to manage things on her own or drive or go home. She is not crying as much. He seems more determined to get her old life back.   Associated Signs/Symptoms: Depression Symptoms:  psychomotor agitation, difficulty concentrating, panic attacks,  Anxiety Symptoms:  Agoraphobia, Excessive  Worry, Panic Symptoms, Social Anxiety,   Past Psychiatric History: none  Previous Psychotropic Medications: Yes   Substance Abuse History in the last 12 months:  No.  Consequences of Substance Abuse: NA  Past Medical History:  Past Medical History:  Diagnosis Date  . Agoraphobia with panic attacks 08/07/2016  . Anxiety   . Arthritis   . Atrial fibrillation, currently in sinus rhythm   . Cancer [facility])    Endometrial  . Depression   . Endometrial cancer, FIGO stage IIIC ([place])  08/08/2015  . Essential hypertension, benign   . GERD (gastroesophageal reflux disease)   . Hypothyroidism   . Mixed hyperlipidemia   . Palpitations   . Spinal stenosis   . Spinal stenosis of lumbar region at multiple levels 08/07/2016  . Type 2 diabetes mellitus ([place])     Past Surgical History:  Procedure Laterality Date  . ABDOMINAL HYSTERECTOMY    . BREAST BIOPSY     benign  . LIPOMA EXCISION    . Multiple dental extractions    . TONSILLECTOMY AND ADENOIDECTOMY    . TOTAL KNEE ARTHROPLASTY  08/04/2012   Procedure: TOTAL KNEE ARTHROPLASTY;  Surgeon: Gearlean Alf, MD;  Location: [facility] ORS;  Service: Orthopedics;  Laterality: Left;    Family Psychiatric History: none  Family History:  Family History  Problem Relation Age of Onset  . Stroke Mother   . Heart failure Mother   . Hypertension Father   . Coronary artery disease Father     Social History:   Social History   Social History  . Marital status: Single    Spouse name: N/A  . Number of children: N/A  . Years of education: N/A   Social History Main Topics  . Smoking status: Never Smoker  . Smokeless tobacco: Never Used  . Alcohol use No     Comment: 07/10/2016 per pt no  . Drug use: No     Comment: 8/3/2017per pt no   . Sexual activity: No   Other Topics Concern  . None   Social History Narrative  . None    Additional Social History: The patient grew up in [place] with both parents and one younger sister. She states that she had an excellent childhood with no history of trauma or abuse. 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 She is never married and has lived with the same friend for 40 years. As a high school student and a younger person she was very active in sports such as softball and basketball and still very much enjoys watching the sports  Allergies:   Allergies  Allergen Reactions  . Morphine And Related Other (See Comments)     Hypotension  . Other     Preservative in latanoprost causes redness, pt uses preservative free latanoprost   . Paxil [Paroxetine Hcl]     unknown  . Latex Rash    Metabolic Disorder Labs: No results found for: HGBA1C, MPG No results found for: PROLACTIN No results found for: CHOL, TRIG, HDL, CHOLHDL, VLDL, LDLCALC   Current Medications: Current Outpatient Prescriptions  Medication Sig Dispense Refill  . alum & mag hydroxide-simeth (MAALOX/MYLANTA) 200-200-20 MG/5ML suspension Take 30 mLs by mouth every 6 (six) hours as needed for indigestion or heartburn.    Marland Kitchen aspirin 81 MG tablet Take 81 mg by mouth at bedtime.     Marland Kitchen atorvastatin (LIPITOR) 10 MG tablet Take 10  mg by mouth every evening.     . calcium carbonate (TUMS - DOSED IN MG ELEMENTAL CALCIUM) 500 MG chewable tablet Chew 1 tablet by mouth daily as needed for indigestion or heartburn.    . escitalopram (LEXAPRO) 20 MG tablet Take 1 tablet (20 mg total) by mouth daily. 30 tablet 2  . fentaNYL (DURAGESIC - DOSED MCG/HR) 75 MCG/HR Place 1 patch (75 mcg total) onto the skin every 3 (three) days. 10 patch 0  . ibuprofen (ADVIL,MOTRIN) 200 MG tablet Take 200 mg by mouth 2 (two) times daily as needed for moderate pain.     Marland Kitchen lamoTRIgine (LAMICTAL) 100 MG tablet Taking 0.5 Tablets in AM and 1 Tablet in PM 60 tablet 2  . latanoprost (XALATAN) 0.005 % ophthalmic solution Place 1 drop into both eyes at bedtime.    Marland Kitchen levothyroxine (SYNTHROID, LEVOTHROID) 88 MCG tablet Take 88 mcg by mouth daily before breakfast.     . lidocaine-prilocaine (EMLA) cream Apply 1 application topically as needed. With chemo    . lisinopril (PRINIVIL,ZESTRIL) 10 MG tablet Take 10 mg by mouth every morning.     . loratadine (CLARITIN) 10 MG tablet Take 10 mg by mouth daily as needed for allergies.    . magnesium oxide (MAG-OX) 400 MG tablet Take 400 mg by mouth 2 (two) times daily.    . metoprolol succinate (TOPROL-XL) 25 MG 24 hr tablet TAKE 1/2 TABLET BY MOUTH  EVERY MORNING (PATIENT NEEDS OFFICE VISIT PER MD) 45 tablet 3  . ondansetron (ZOFRAN) 8 MG tablet Take 8 mg by mouth every 8 (eight) hours as needed for nausea or vomiting.     . polyethylene glycol (MIRALAX / GLYCOLAX) packet Take 17 g by mouth daily as needed for mild constipation.     . prochlorperazine (COMPAZINE) 10 MG tablet Take 10 mg by mouth as needed for nausea or vomiting.      No current facility-administered medications for this visit.     Neurologic: Headache: No Seizure: No Paresthesias:No  Musculoskeletal: Strength & Muscle Tone: within normal limits Gait & Station: unsteady Patient leans: N/A  Psychiatric Specialty Exam: Review of Systems  Musculoskeletal: Positive for back pain.  Psychiatric/Behavioral: The patient is nervous/anxious.   All other systems reviewed and are negative.   Blood pressure (!) 179/73, pulse 68, height 5\' 2"  (1.575 m), weight 159 lb 3.2 oz (72.2 kg).Body mass index is 29.12 kg/m.  General Appearance: Casual, Neat and Well Groomed walking with a walker And seems to be walking more easily   Eye Contact:  Good  Speech:  Clear and Coherent  Volume:  Normal  Mood:  Anxious Less depressed   Affect: Brighter, hands are shaking a bit but she states she is nervous about coming here   Thought Process:  Goal Directed  Orientation:  Full (Time, Place, and Person)  Thought Content:  Rumination  Suicidal Thoughts:  No  Homicidal Thoughts:  No  Memory:  Immediate;   Good Recent;   Good Remote;   Good  Judgement:  Fair  Insight:  Good  Psychomotor Activity:  Normal  Concentration:  Concentration: Good and Attention Span: Fair  Recall:  Good  Fund of Knowledge:Good  Language: Good  Akathisia:  No  Handed:  Right  AIMS (if indicated):    Assets:  Communication Skills Desire for Improvement Resilience Social Support Talents/Skills  ADL's:  Intact  Cognition: WNL  Sleep:  ok    Treatment Plan Summary: Medication management   The  patient will continue Lamictal 50 mg in the morning and 100 mg at bedtime. She'll Continue Lexapro  20 mg daily because of depression and anxiety symptoms. Her gynecologist has her completely off clonazepam She'll return to see me in 6 weeks and we'll continue her counseling   [name], MD 4/17/201811:23 AM Patient ID: Yesenia Reynolds, female   DOB: 1941-05-14, 76 y.o.   MRN: [id number]

## 2017-03-25 ENCOUNTER — Other Ambulatory Visit (HOSPITAL_COMMUNITY): Payer: Self-pay

## 2017-03-26 ENCOUNTER — Encounter (HOSPITAL_COMMUNITY): Payer: Medicare Other | Attending: Oncology

## 2017-03-26 VITALS — BP 154/54 | HR 74 | Temp 99.2°F | Resp 18

## 2017-03-26 DIAGNOSIS — C541 Malignant neoplasm of endometrium: Secondary | ICD-10-CM | POA: Insufficient documentation

## 2017-03-26 DIAGNOSIS — Z452 Encounter for adjustment and management of vascular access device: Secondary | ICD-10-CM

## 2017-03-26 DIAGNOSIS — Z95828 Presence of other vascular implants and grafts: Secondary | ICD-10-CM | POA: Insufficient documentation

## 2017-03-26 MED ORDER — SODIUM CHLORIDE 0.9% FLUSH
10.0000 mL | Freq: Once | INTRAVENOUS | Status: AC
  Administered 2017-03-26: 10 mL via INTRAVENOUS

## 2017-03-26 MED ORDER — HEPARIN SOD (PORK) LOCK FLUSH 100 UNIT/ML IV SOLN
500.0000 [IU] | Freq: Once | INTRAVENOUS | Status: AC
  Administered 2017-03-26: 500 [IU] via INTRAVENOUS
  Filled 2017-03-26: qty 5

## 2017-03-26 NOTE — Patient Instructions (Signed)
[facility] at [facility] Discharge Instructions  RECOMMENDATIONS MADE BY THE CONSULTANT AND ANY TEST RESULTS WILL BE SENT TO YOUR REFERRING PHYSICIAN.  Port flush done today as ordered. Return as scheduled.  Thank you for choosing [facility] at [facility] to provide your oncology and hematology care.  To afford each patient quality time with our provider, please arrive at least 15 minutes before your scheduled appointment time.    If you have a lab appointment with the Cancer Center please come in thru the  Main Entrance and check in at the main information desk  You need to re-schedule your appointment should you arrive 10 or more minutes late.  We strive to give you quality time with our providers, and arriving late affects you and other patients whose appointments are after yours.  Also, if you no show three or more times for appointments you may be dismissed from the clinic at the providers discretion.     Again, thank you for choosing [facility].  Our hope is that these requests will decrease the amount of time that you wait before being seen by our physicians.       _____________________________________________________________  Should you have questions after your visit to [facility], please contact our office at [phone] between the hours of 8:30 a.m. and 4:30 p.m.  Voicemails left after 4:30 p.m. will not be returned until the following business day.  For prescription refill requests, have your pharmacy contact our office.       Resources For Cancer Patients and their Caregivers ? American Cancer Society: Can assist with transportation, wigs, general needs, runs Look Good Feel Better.        1-888-227-6333 ? Cancer Care: Provides financial assistance, online support groups, medication/co-pay assistance.  1-800-813-HOPE (4673) ? [facility] Assists [place] cancer  patients and their families through emotional , educational and financial support.  336-427-4357 ? [place] DSS Where to apply for food stamps, Medicaid and utility assistance. 336-342-1394 ? [facility]: Transportation to medical appointments. 336-347-2287 ? Social Security Administration: May apply for disability if have a Stage IV cancer. 336-342-7796 1-800-772-1213 ? [place] Aging, Disability and Transit Services: Assists with nutrition, care and transit needs. 336-349-2343  Cancer Center Support Programs: @10RELATIVEDAYS@ > Cancer Support Group  2nd Tuesday of the month 1pm-2pm, Journey Room  > Creative Journey  3rd Tuesday of the month 1130am-1pm, Journey Room  > Look Good Feel Better  1st Wednesday of the month 10am-12 noon, Journey Room (Call American Cancer Society to register 1-800-395-5775)   

## 2017-03-31 ENCOUNTER — Encounter (HOSPITAL_COMMUNITY): Payer: Self-pay | Admitting: *Deleted

## 2017-03-31 ENCOUNTER — Ambulatory Visit (HOSPITAL_COMMUNITY)
Admission: RE | Admit: 2017-03-31 | Discharge: 2017-03-31 | Disposition: A | Discharge status: Home / Self Care | Payer: Medicare Other | Source: Ambulatory Visit | Attending: Ophthalmology | Admitting: Ophthalmology

## 2017-03-31 ENCOUNTER — Ambulatory Visit (HOSPITAL_COMMUNITY): Payer: Medicare Other | Admitting: Anesthesiology

## 2017-03-31 ENCOUNTER — Encounter (HOSPITAL_COMMUNITY)
Admission: RE | Disposition: A | Discharge status: Home / Self Care | Payer: Self-pay | Source: Ambulatory Visit | Attending: Ophthalmology

## 2017-03-31 DIAGNOSIS — I10 Essential (primary) hypertension: Secondary | ICD-10-CM | POA: Diagnosis not present

## 2017-03-31 DIAGNOSIS — E1136 Type 2 diabetes mellitus with diabetic cataract: Secondary | ICD-10-CM | POA: Insufficient documentation

## 2017-03-31 DIAGNOSIS — F419 Anxiety disorder, unspecified: Secondary | ICD-10-CM | POA: Diagnosis not present

## 2017-03-31 DIAGNOSIS — E669 Obesity, unspecified: Secondary | ICD-10-CM | POA: Diagnosis not present

## 2017-03-31 DIAGNOSIS — F329 Major depressive disorder, single episode, unspecified: Secondary | ICD-10-CM | POA: Insufficient documentation

## 2017-03-31 DIAGNOSIS — Z79899 Other long term (current) drug therapy: Secondary | ICD-10-CM | POA: Insufficient documentation

## 2017-03-31 DIAGNOSIS — H2511 Age-related nuclear cataract, right eye: Secondary | ICD-10-CM | POA: Diagnosis not present

## 2017-03-31 DIAGNOSIS — K219 Gastro-esophageal reflux disease without esophagitis: Secondary | ICD-10-CM | POA: Diagnosis not present

## 2017-03-31 DIAGNOSIS — I739 Peripheral vascular disease, unspecified: Secondary | ICD-10-CM | POA: Insufficient documentation

## 2017-03-31 DIAGNOSIS — E039 Hypothyroidism, unspecified: Secondary | ICD-10-CM | POA: Diagnosis not present

## 2017-03-31 DIAGNOSIS — H2512 Age-related nuclear cataract, left eye: Secondary | ICD-10-CM | POA: Diagnosis not present

## 2017-03-31 HISTORY — PX: CATARACT EXTRACTION W/PHACO: SHX586

## 2017-03-31 LAB — GLUCOSE, CAPILLARY: Glucose-Capillary: 94 mg/dL (ref 65–99)

## 2017-03-31 SURGERY — PHACOEMULSIFICATION, CATARACT, WITH IOL INSERTION
Anesthesia: Monitor Anesthesia Care | Site: Eye | Laterality: Left

## 2017-03-31 MED ORDER — TETRACAINE HCL 0.5 % OP SOLN
1.0000 [drp] | OPHTHALMIC | Status: AC
  Administered 2017-03-31 (×3): 1 [drp] via OPHTHALMIC

## 2017-03-31 MED ORDER — FENTANYL CITRATE (PF) 100 MCG/2ML IJ SOLN
INTRAMUSCULAR | Status: AC
  Filled 2017-03-31: qty 2

## 2017-03-31 MED ORDER — EPINEPHRINE PF 1 MG/ML IJ SOLN
INTRAOCULAR | Status: DC | PRN
  Administered 2017-03-31: 09:00:00

## 2017-03-31 MED ORDER — MIDAZOLAM HCL 2 MG/2ML IJ SOLN
INTRAMUSCULAR | Status: AC
  Filled 2017-03-31: qty 2

## 2017-03-31 MED ORDER — PHENYLEPHRINE HCL 2.5 % OP SOLN
1.0000 [drp] | OPHTHALMIC | Status: AC
  Administered 2017-03-31 (×3): 1 [drp] via OPHTHALMIC

## 2017-03-31 MED ORDER — BSS IO SOLN
INTRAOCULAR | Status: DC | PRN
  Administered 2017-03-31: 15 mL via INTRAOCULAR

## 2017-03-31 MED ORDER — PROVISC 10 MG/ML IO SOLN
INTRAOCULAR | Status: DC | PRN
  Administered 2017-03-31: 0.85 mL via INTRAOCULAR

## 2017-03-31 MED ORDER — LACTATED RINGERS IV SOLN
INTRAVENOUS | Status: DC
  Administered 2017-03-31: 08:00:00 via INTRAVENOUS

## 2017-03-31 MED ORDER — CYCLOPENTOLATE-PHENYLEPHRINE 0.2-1 % OP SOLN
1.0000 [drp] | OPHTHALMIC | Status: AC
  Administered 2017-03-31 (×3): 1 [drp] via OPHTHALMIC

## 2017-03-31 MED ORDER — FENTANYL CITRATE (PF) 100 MCG/2ML IJ SOLN
25.0000 ug | INTRAMUSCULAR | Status: AC
  Administered 2017-03-31: 25 ug via INTRAVENOUS

## 2017-03-31 MED ORDER — KETOROLAC TROMETHAMINE 0.5 % OP SOLN
1.0000 [drp] | OPHTHALMIC | Status: AC
  Administered 2017-03-31 (×3): 1 [drp] via OPHTHALMIC

## 2017-03-31 MED ORDER — MIDAZOLAM HCL 2 MG/2ML IJ SOLN: 1.0000 mg | INTRAMUSCULAR | Status: AC

## 2017-03-31 SURGICAL SUPPLY — 12 items
CLOTH BEACON ORANGE TIMEOUT ST (SAFETY) ×3 IMPLANT
EYE SHIELD UNIVERSAL CLEAR (GAUZE/BANDAGES/DRESSINGS) ×3 IMPLANT
GLOVE BIOGEL PI IND STRL 6.5 (GLOVE) ×1 IMPLANT
GLOVE BIOGEL PI IND STRL 7.5 (GLOVE) ×1 IMPLANT
GLOVE BIOGEL PI INDICATOR 6.5 (GLOVE) ×2
GLOVE BIOGEL PI INDICATOR 7.5 (GLOVE) ×2
GLOVE EXAM NITRILE MD LF STRL (GLOVE) ×3 IMPLANT
LENS ALC ACRYL/TECN (Ophthalmic Related) ×3 IMPLANT
PAD ARMBOARD 7.5X6 YLW CONV (MISCELLANEOUS) ×3 IMPLANT
TAPE SURG TRANSPARENT 2IN (GAUZE/BANDAGES/DRESSINGS) ×1 IMPLANT
TAPE TRANSPARENT 2IN (GAUZE/BANDAGES/DRESSINGS) ×2
WATER STERILE IRR 250ML POUR (IV SOLUTION) ×3 IMPLANT

## 2017-03-31 NOTE — Op Note (Signed)
Patient brought to the operating room and prepped and draped in the usual manner.  Lid speculum inserted in left eye.  Stab incision made at the twelve o'clock position.  Provisc instilled in the anterior chamber.   A 2.4 mm. Stab incision was made temporally.  An anterior capsulotomy was done with a bent 25 gauge needle.  The nucleus was hydrodissected.  The Phaco tip was inserted in the anterior chamber and the nucleus was emulsified.  CDE was 6.87.  The cortical material was then removed with the I and A tip.  Posterior capsule was the polished.  The anterior chamber was deepened with Provisc.  A 16.5 Diopter Alcon [id number] IOL was then inserted in the capsular bag.  Provisc was then removed with the I and A tip.  The wound was then hydrated.  Patient sent to the Recovery Room in good condition with follow up in my office.  Preoperative Diagnosis:  Nuclear Cataract OS Postoperative Diagnosis:  Same Procedure name: Kelman Phacoemulsification OS with IOL

## 2017-03-31 NOTE — Anesthesia Postprocedure Evaluation (Signed)
Anesthesia Post Note  Patient: Yesenia Reynolds  Procedure(s) Performed: Procedure(s) (LRB): CATARACT EXTRACTION PHACO AND INTRAOCULAR LENS PLACEMENT (IOC) (Left)  Patient location during evaluation: Short Stay Anesthesia Type: MAC Level of consciousness: awake and alert and oriented Pain management: pain level controlled Vital Signs Assessment: post-procedure vital signs reviewed and stable Respiratory status: spontaneous breathing Cardiovascular status: blood pressure returned to baseline Postop Assessment: no signs of nausea or vomiting Anesthetic complications: no     Last Vitals:  Vitals:   03/31/17 0850 03/31/17 0855  BP: (!) 159/66   Resp: (!) 62 (!) 38    Last Pain: There were no vitals filed for this visit.               Thresa Dozier

## 2017-03-31 NOTE — Discharge Instructions (Signed)
PATIENT INSTRUCTIONS POST-ANESTHESIA  IMMEDIATELY FOLLOWING SURGERY:  Do not drive or operate machinery for the first twenty four hours after surgery.  Do not make any important decisions for twenty four hours after surgery or while taking narcotic pain medications or sedatives.  If you develop intractable nausea and vomiting or a severe headache please notify your doctor immediately.  FOLLOW-UP:  Please make an appointment with your surgeon as instructed. You do not need to follow up with anesthesia unless specifically instructed to do so.  WOUND CARE INSTRUCTIONS (if applicable):  Keep a dry clean dressing on the anesthesia/puncture wound site if there is drainage.  Once the wound has quit draining you may leave it open to air.  Generally you should leave the bandage intact for twenty four hours unless there is drainage.  If the epidural site drains for more than 36-48 hours please call the anesthesia department.    Cataract A cataract is cloudiness on the lens of your eye. The lens is the clear part of your eye that is behind your iris and pupil. The lens focuses light on the retina, which lets you see clearly. When a lens becomes cloudy, vision may become blurry. The clouding can range from a tiny dot to complete cloudiness. As some cataracts develop, they make a person more nearsighted. Other cataracts increase glare. Cataracts can worsen over time, and sometimes the pupil can look white. Cataracts get bigger and they cloud more of the lens, making it difficult to see. Cataracts can affect one eye or both eyes. What are the causes? Most cataracts are associated with age-related eye changes. The eye lens is mostly made up of water and protein. Normally, this protein is arranged in a way that keeps the lens clear. Cataracts develop when protein begins to clump together over time. This clouds the lens and lets less light pass through to the retina, which causes blurry vision. What increases the  risk? This condition is more likely to develop in people who:  Are 47 years of age or older.  Have diabetes.  Have high blood pressure.  Takecertain medicines, such as steroids or hormone replacement therapy.  Have had an eye injury.  Have or have had eye inflammation.  Have a family history of cataracts.  Smoke.  Drink alcohol heavily.  Are frequently exposed to sun or very strong light without eye protection.  Are obese.  Have been exposed to large amounts of radiation, lead, or other toxic substances.  Have had eye surgery. What are the signs or symptoms? The main symptom of a cataract is blurry vision. Your vision may change or get worse over time. Other symptoms include:  Increased glare.  Seeing a bright ring or halo around light.  Poor night vision.  Double vision in one eye.  Having trouble seeing, even while wearing contact lenses or glasses.  Seeing colors that appear faded.  Trouble telling the difference between blue and purple.  Needing frequent changes to your prescription glasses or contacts. How is this diagnosed? This condition is diagnosed with a medical history and eye exam. You may need to see an eye specialist (optometrist or ophthalmologist). Your health care provider may enlarge (dilate) your pupils with eye drops to see the back of your eye more clearly and look for signs of cataracts or other damage. You may also have tests, including:  A visual acuity test. This uses a chart to determine the smallest letters that you can see from a specific distance.  A slit-lamp exam. This uses a microscope to examine small sections of your eye for abnormalities.  Tonometry. This test measures the pressure of the fluid inside your eye. How is this treated? Treatment depends on the stage of your cataract. For an early cataract, vision may improve by using different eyeglasses or stronger lighting. If that does not help your vision, surgery may be  recommended to remove the cataract. If your health care provider thinks your cataract may be linked to any medicines that you are taking, he or she may change your medicines. Follow these instructions at home: Lifestyle   Use stronger or brighter lighting.  Consider using a magnifying glass for reading or other activities.  Become familiar with your surroundings. Having poor vision can put you at a greater risk for tripping, falling, or bumping into things.  Wear sunglasses and a hat if you are sensitive to bright light or are having problems with glare.  Quit smoking if you smoke. If you need help quitting, talk with your health care provider. General instructions   If you are prescribed new eyeglasses, wear them as told by your health care provider.  Take over-the-counter and prescription medicines only as told by your health care provider. Do not change your medicines unless told by your health care provider.  Do not drive or operate heavy machinery if your vision is blurry, particularly at night.  Keep your blood sugar under control, if you have diabetes.  Keep all follow-up visits as told by your health care provider. This is important. Contact a health care provider if:  Your symptoms get worse.  Your vision affects your ability to perform daily activities.  You have new symptoms.  You have a fever. Get help right away if:  You have sudden vision loss.  You have redness, swelling, or increasing pain in your eye.  You develop a headache and sensitivity to light. This information is not intended to replace advice given to you by your health care provider. Make sure you discuss any questions you have with your health care provider. Document Released: 11/24/2005 Document Revised: 04/03/2016 Document Reviewed: 05/30/2015 Elsevier Interactive Patient Education  2017 [place]?:  Please feel free to call your physician or the hospital operator if you have  any questions, and they will be happy to assist you.                 [facility] Instructions [place] [address]      1. Avoid closing eyes tightly. One often closes the eye tightly when laughing, talking, sneezing, coughing or if they feel irritated. At these times, you should be careful not to close your eyes tightly.  2. Instill eye drops as instructed. To instill drops in your eye, open it, look up and have someone gently pull the lower lid down and instill a couple of drops inside the lower lid.  3. Do not touch upper lid.  4. Take Advil or Tylenol for pain.  5. You may use either eye for near work, such as reading or sewing and you may watch television.  6. You may have your hair done at the beauty parlor at any time.  7. Wear dark glasses with or without your own glasses if you are in bright light.  8. Call our office at [phone] or [phone] if you have sharp pain in your eye or unusual symptoms.  9.  FOLLOW UP WITH DR. SHAPIRO  TODAY IN HIS [place] OFFICE AT 3:15 pm.    I have received a copy of the above instructions and will follow them.     IF YOU ARE IN IMMEDIATE DANGER CALL 911!  It is important for you to keep your follow-up appointment with your physician after discharge, OR, for you /your caregiver to make a follow-up appointment with your physician / medical provider after discharge.  Show these instructions to the next healthcare provider you see.

## 2017-03-31 NOTE — H&P (Signed)
The patient was re examined and there is no change in the patients condition since the original H and P. 

## 2017-03-31 NOTE — Transfer of Care (Signed)
Immediate Anesthesia Transfer of Care Note  Patient: Yesenia Reynolds  Procedure(s) Performed: Procedure(s) with comments: CATARACT EXTRACTION PHACO AND INTRAOCULAR LENS PLACEMENT (IOC) (Left) - CDE: 8.67  Patient Location: Short Stay  Anesthesia Type:MAC  Level of Consciousness: awake, alert  and oriented  Airway & Oxygen Therapy: Patient Spontanous Breathing  Post-op Assessment: Report given to RN  Post vital signs: Reviewed  Last Vitals:  Vitals:   03/31/17 0850 03/31/17 0855  BP: (!) 159/66   Resp: (!) 62 (!) 38    Last Pain: There were no vitals filed for this visit.       Complications: No apparent anesthesia complications

## 2017-03-31 NOTE — Anesthesia Preprocedure Evaluation (Signed)
Anesthesia Evaluation  Patient identified by MRN, date of birth, ID band Patient awake    Reviewed: Allergy & Precautions, H&P , NPO status , Patient's Chart, lab work & pertinent test results  History of Anesthesia Complications (+) AWARENESS UNDER ANESTHESIA  Airway Mallampati: II  TM Distance: >3 FB Neck ROM: Full    Dental no notable dental hx.    Pulmonary neg pulmonary ROS,    Pulmonary exam normal breath sounds clear to auscultation       Cardiovascular hypertension, Pt. on home beta blockers and Pt. on medications + Peripheral Vascular Disease  Normal cardiovascular exam Rhythm:Regular Rate:Normal  Reviewed recent cardiology clearance note. Diastolic dysfunction with some valvular regurgitation.   Neuro/Psych PSYCHIATRIC DISORDERS Anxiety Depression negative neurological ROS  negative psych ROS   GI/Hepatic Neg liver ROS, GERD  Medicated,  Endo/Other  negative endocrine ROSdiabetes, Type 2Hypothyroidism   Renal/GU negative Renal ROS  negative genitourinary   Musculoskeletal negative musculoskeletal ROS (+)   Abdominal (+) + obese,   Peds negative pediatric ROS (+)  Hematology negative hematology ROS (+)   Anesthesia Other Findings   Reproductive/Obstetrics negative OB ROS                             Anesthesia Physical Anesthesia Plan  ASA: III  Anesthesia Plan: MAC   Post-op Pain Management:    Induction: Intravenous  Airway Management Planned: Nasal Cannula  Additional Equipment:   Intra-op Plan:   Post-operative Plan:   Informed Consent: I have reviewed the patients History and Physical, chart, labs and discussed the procedure including the risks, benefits and alternatives for the proposed anesthesia with the patient or authorized representative who has indicated his/her understanding and acceptance.     Plan Discussed with:   Anesthesia Plan Comments:          Anesthesia Quick Evaluation

## 2017-04-01 ENCOUNTER — Encounter (HOSPITAL_COMMUNITY): Payer: Self-pay | Admitting: Ophthalmology

## 2017-04-09 ENCOUNTER — Encounter (HOSPITAL_COMMUNITY): Payer: Self-pay | Admitting: Psychiatry

## 2017-04-09 ENCOUNTER — Ambulatory Visit (INDEPENDENT_AMBULATORY_CARE_PROVIDER_SITE_OTHER): Payer: Medicare Other | Admitting: Psychiatry

## 2017-04-09 DIAGNOSIS — F411 Generalized anxiety disorder: Secondary | ICD-10-CM

## 2017-04-09 NOTE — Progress Notes (Signed)
  Patient:  Yesenia Reynolds   DOB: 1941-08-03  MR Number: [id number]  Location: [facility]:  [address] [place],  [place], [address]  Start:    Thursday 04/09/2017 10:05 AM End:  Thursday 04/09/2017 10:55 AM     Provider/Observer:     Maurice Small, MSW, LCSW   Chief Complaint:      Chief Complaint  Patient presents with  . Anxiety    Reason For Service:     ALABAMA DOIG is a 76 year old female who presents with symptoms of anxiety that began after she had treatment for cancer in 2016. Prior to treatment, patient reports being very independent and outgoing. After treatment, she became fearful of staying alone, driving, and being away from home. She reports excessive worry and says she developed shakiness. She also reports nervousness and decreased social involvement.  Interventions Strategy:  Supportive       Participation Level:   Active  Participation Quality:  Appropriate      Behavioral Observation:  Casual, Alert, nervous, restless,   Current Psychosocial Factors: Patient recently moved in to a senior living community, Housemate has multiple health issues and  was admitted to rehab facility after recent hospitalization,   Content of Session:   reviewed symptoms, praised and reinforced patient's increased activity and social involvement, facilitated expression of feelings about situation with housemate, assisted patient identify grief/ loss issues regarding change in the relationship with her housemate, discussed effects of anxiety on her current functioning/relationships, assisted patient identify activities she used to enjoy and would like to pursue    Current Status:   decreased anxiety, decreased excessive worry, hands/arms continue to tremble  Suicidal/Homicidal:    No          Patient Progress:   Fair. Patient reports  decreased worry about situation with her roommate since last session. Patient says she has made the decision not to try to return to her  condo at the same time her housemate plans to do so which is the beginning of next month. She says she knows she is not ready to return home and she is not able to take care of her housemate as she thinks housemate expects. She also reports housemate has been negative to patient and their relationship has changed. She expresses sadness along with grief/loss issues about this and her changed functioning. She reports she does want to start pursuing activities she use to enjoy like attending church. She also expresses desire to attend great nephew's graduation in June 2018. She also says she wants to start driving again.    Target Goals:   1. Learn and implement relapse prevention strategies for managing possible future anxiety symptoms  Last Reviewed:   02/11/2017  Goals Addressed Today:    1  Plan         :                   Return again in  2 weeks.  Impression/Diagnosis:  Patient presents with symptoms of anxiety that began after she had treatment for cancer in 2016. She reports excessive worry, motor tension, and social withdrawal.  She worries about a variety of issues.      Diagnosis:  Axis I: Generalized anxiety disorder          Axis II: Deferred   [place], LCSW 04/09/2017

## 2017-04-10 ENCOUNTER — Encounter (HOSPITAL_COMMUNITY)
Admission: RE | Admit: 2017-04-10 | Discharge: 2017-04-10 | Disposition: A | Discharge status: Home / Self Care | Payer: Medicare Other | Source: Ambulatory Visit | Attending: Ophthalmology | Admitting: Ophthalmology

## 2017-04-10 ENCOUNTER — Encounter (HOSPITAL_COMMUNITY): Payer: Self-pay

## 2017-04-14 ENCOUNTER — Ambulatory Visit (HOSPITAL_COMMUNITY)
Admission: RE | Admit: 2017-04-14 | Discharge: 2017-04-14 | Disposition: A | Discharge status: Home / Self Care | Payer: Medicare Other | Source: Ambulatory Visit | Attending: Ophthalmology | Admitting: Ophthalmology

## 2017-04-14 ENCOUNTER — Ambulatory Visit (HOSPITAL_COMMUNITY): Payer: Medicare Other | Admitting: Anesthesiology

## 2017-04-14 ENCOUNTER — Encounter (HOSPITAL_COMMUNITY): Payer: Self-pay | Admitting: *Deleted

## 2017-04-14 ENCOUNTER — Encounter (HOSPITAL_COMMUNITY)
Admission: RE | Disposition: A | Discharge status: Home / Self Care | Payer: Self-pay | Source: Ambulatory Visit | Attending: Ophthalmology

## 2017-04-14 DIAGNOSIS — F419 Anxiety disorder, unspecified: Secondary | ICD-10-CM | POA: Diagnosis not present

## 2017-04-14 DIAGNOSIS — E039 Hypothyroidism, unspecified: Secondary | ICD-10-CM | POA: Insufficient documentation

## 2017-04-14 DIAGNOSIS — K219 Gastro-esophageal reflux disease without esophagitis: Secondary | ICD-10-CM | POA: Insufficient documentation

## 2017-04-14 DIAGNOSIS — H269 Unspecified cataract: Secondary | ICD-10-CM | POA: Diagnosis not present

## 2017-04-14 DIAGNOSIS — I739 Peripheral vascular disease, unspecified: Secondary | ICD-10-CM | POA: Insufficient documentation

## 2017-04-14 DIAGNOSIS — I10 Essential (primary) hypertension: Secondary | ICD-10-CM | POA: Diagnosis not present

## 2017-04-14 DIAGNOSIS — Z79899 Other long term (current) drug therapy: Secondary | ICD-10-CM | POA: Insufficient documentation

## 2017-04-14 DIAGNOSIS — E1136 Type 2 diabetes mellitus with diabetic cataract: Secondary | ICD-10-CM | POA: Diagnosis not present

## 2017-04-14 DIAGNOSIS — F329 Major depressive disorder, single episode, unspecified: Secondary | ICD-10-CM | POA: Insufficient documentation

## 2017-04-14 DIAGNOSIS — E669 Obesity, unspecified: Secondary | ICD-10-CM | POA: Diagnosis not present

## 2017-04-14 DIAGNOSIS — Z6829 Body mass index (BMI) 29.0-29.9, adult: Secondary | ICD-10-CM | POA: Diagnosis not present

## 2017-04-14 DIAGNOSIS — H2511 Age-related nuclear cataract, right eye: Secondary | ICD-10-CM | POA: Diagnosis not present

## 2017-04-14 HISTORY — PX: CATARACT EXTRACTION W/PHACO: SHX586

## 2017-04-14 LAB — GLUCOSE, CAPILLARY: Glucose-Capillary: 108 mg/dL — ABNORMAL HIGH (ref 65–99)

## 2017-04-14 SURGERY — PHACOEMULSIFICATION, CATARACT, WITH IOL INSERTION
Anesthesia: Monitor Anesthesia Care | Laterality: Right

## 2017-04-14 MED ORDER — PHENYLEPHRINE HCL 2.5 % OP SOLN
1.0000 [drp] | OPHTHALMIC | Status: AC
  Administered 2017-04-14 (×3): 1 [drp] via OPHTHALMIC

## 2017-04-14 MED ORDER — FENTANYL CITRATE (PF) 100 MCG/2ML IJ SOLN
INTRAMUSCULAR | Status: AC
  Filled 2017-04-14: qty 2

## 2017-04-14 MED ORDER — KETOROLAC TROMETHAMINE 0.5 % OP SOLN
1.0000 [drp] | OPHTHALMIC | Status: AC
Start: 2017-04-14 — End: 2017-04-14
  Administered 2017-04-14 (×3): 1 [drp] via OPHTHALMIC

## 2017-04-14 MED ORDER — MIDAZOLAM HCL 2 MG/2ML IJ SOLN
1.0000 mg | INTRAMUSCULAR | Status: AC
  Administered 2017-04-14: 2 mg via INTRAVENOUS

## 2017-04-14 MED ORDER — CYCLOPENTOLATE-PHENYLEPHRINE 0.2-1 % OP SOLN
1.0000 [drp] | OPHTHALMIC | Status: AC
Start: 2017-04-14 — End: 2017-04-14
  Administered 2017-04-14 (×3): 1 [drp] via OPHTHALMIC

## 2017-04-14 MED ORDER — BSS IO SOLN
INTRAOCULAR | Status: DC | PRN
  Administered 2017-04-14: 15 mL

## 2017-04-14 MED ORDER — PROVISC 10 MG/ML IO SOLN
INTRAOCULAR | Status: DC | PRN
  Administered 2017-04-14: 0.85 mL via INTRAOCULAR

## 2017-04-14 MED ORDER — TETRACAINE 0.5 % OP SOLN OPTIME - NO CHARGE
OPHTHALMIC | Status: DC | PRN
Start: 2017-04-14 — End: 2017-04-14
  Administered 2017-04-14: 2 [drp] via OPHTHALMIC

## 2017-04-14 MED ORDER — FENTANYL CITRATE (PF) 100 MCG/2ML IJ SOLN
25.0000 ug | Freq: Once | INTRAMUSCULAR | Status: AC
  Administered 2017-04-14: 25 ug via INTRAVENOUS

## 2017-04-14 MED ORDER — MIDAZOLAM HCL 2 MG/2ML IJ SOLN
INTRAMUSCULAR | Status: AC
  Filled 2017-04-14: qty 2

## 2017-04-14 MED ORDER — EPINEPHRINE PF 1 MG/ML IJ SOLN
INTRAMUSCULAR | Status: DC | PRN
  Administered 2017-04-14: 500 mL

## 2017-04-14 MED ORDER — LACTATED RINGERS IV SOLN
INTRAVENOUS | Status: DC
  Administered 2017-04-14: 09:00:00 via INTRAVENOUS

## 2017-04-14 MED ORDER — TETRACAINE HCL 0.5 % OP SOLN
1.0000 [drp] | OPHTHALMIC | Status: AC
Start: 2017-04-14 — End: 2017-04-14
  Administered 2017-04-14 (×3): 1 [drp] via OPHTHALMIC

## 2017-04-14 SURGICAL SUPPLY — 11 items
CLOTH BEACON ORANGE TIMEOUT ST (SAFETY) ×3 IMPLANT
EYE SHIELD UNIVERSAL CLEAR (GAUZE/BANDAGES/DRESSINGS) ×3 IMPLANT
GLOVE BIO SURGEON STRL SZ 6.5 (GLOVE) ×2 IMPLANT
GLOVE BIO SURGEONS STRL SZ 6.5 (GLOVE) ×1
GLOVE BIOGEL PI IND STRL 7.5 (GLOVE) ×1 IMPLANT
GLOVE BIOGEL PI INDICATOR 7.5 (GLOVE) ×2
LENS ALC ACRYL/TECN (Ophthalmic Related) ×3 IMPLANT
PAD ARMBOARD 7.5X6 YLW CONV (MISCELLANEOUS) ×3 IMPLANT
TAPE SURG TRANSPORE 1 IN (GAUZE/BANDAGES/DRESSINGS) ×1 IMPLANT
TAPE SURGICAL TRANSPORE 1 IN (GAUZE/BANDAGES/DRESSINGS) ×2
WATER STERILE IRR 250ML POUR (IV SOLUTION) ×3 IMPLANT

## 2017-04-14 NOTE — Discharge Instructions (Signed)
[facility] Instructions [place] [address]-[place]      1. Avoid closing eyes tightly. One often closes the eye tightly when laughing, talking, sneezing, coughing or if they feel irritated. At these times, you should be careful not to close your eyes tightly.  2. Instill eye drops as instructed. To instill drops in your eye, open it, look up and have someone gently pull the lower lid down and instill a couple of drops inside the lower lid.  3. Do not touch upper lid.  4. Take Advil or Tylenol for pain.  5. You may use either eye for near work, such as reading or sewing and you may watch television.  6. You may have your hair done at the beauty parlor at any time.  7. Wear dark glasses with or without your own glasses if you are in bright light.  8. Call our office at [phone] or [phone] if you have sharp pain in your eye or unusual symptoms.  9.  FOLLOW UP WITH DR. SHAPIRO TODAY IN HIS [place] OFFICE AT 3:15pm.    I have received a copy of the above instructions and will follow them.     IF YOU ARE IN IMMEDIATE DANGER CALL 911!  It is important for you to keep your follow-up appointment with your physician after discharge, OR, for you /your caregiver to make a follow-up appointment with your physician / medical provider after discharge.  Show these instructions to the next healthcare provider you see.   Moderate Conscious Sedation, Adult, Care After These instructions provide you with information about caring for yourself after your procedure. Your health care provider may also give you more specific instructions. Your treatment has been planned according to current medical practices, but problems sometimes occur. Call your health care provider if you have any problems or questions after your procedure. What can I expect after the procedure? After your procedure, it is common:  To feel sleepy for several  hours.  To feel clumsy and have poor balance for several hours.  To have poor judgment for several hours.  To vomit if you eat too soon. Follow these instructions at home: For at least 24 hours after the procedure:    Do not:  Participate in activities where you could fall or become injured.  Drive.  Use heavy machinery.  Drink alcohol.  Take sleeping pills or medicines that cause drowsiness.  Make important decisions or sign legal documents.  Take care of children on your own.  Rest. Eating and drinking   Follow the diet recommended by your health care provider.  If you vomit:  Drink water, juice, or soup when you can drink without vomiting.  Make sure you have little or no nausea before eating solid foods. General instructions   Have a responsible adult stay with you until you are awake and alert.  Take over-the-counter and prescription medicines only as told by your health care provider.  If you smoke, do not smoke without supervision.  Keep all follow-up visits as told by your health care provider. This is important. Contact a health care provider if:  You keep feeling nauseous or you keep vomiting.  You feel light-headed.  You develop a rash.  You have a fever. Get help right away if:  You have trouble breathing. This information is not intended to replace advice given to you by your health care provider. Make  sure you discuss any questions you have with your health care provider. Document Released: 09/14/2013 Document Revised: 04/28/2016 Document Reviewed: 03/15/2016 Elsevier Interactive Patient Education  2017 [employer].

## 2017-04-14 NOTE — Anesthesia Preprocedure Evaluation (Signed)
Anesthesia Evaluation  Patient identified by MRN, date of birth, ID band Patient awake    Reviewed: Allergy & Precautions, H&P , NPO status , Patient's Chart, lab work & pertinent test results  History of Anesthesia Complications (+) AWARENESS UNDER ANESTHESIA  Airway Mallampati: II  TM Distance: >3 FB Neck ROM: Full    Dental no notable dental hx.    Pulmonary neg pulmonary ROS,    Pulmonary exam normal breath sounds clear to auscultation       Cardiovascular hypertension, Pt. on home beta blockers and Pt. on medications + Peripheral Vascular Disease  Normal cardiovascular exam Rhythm:Regular Rate:Normal  Reviewed recent cardiology clearance note. Diastolic dysfunction with some valvular regurgitation.   Neuro/Psych PSYCHIATRIC DISORDERS Anxiety Depression negative neurological ROS  negative psych ROS   GI/Hepatic Neg liver ROS, GERD  Medicated,  Endo/Other  negative endocrine ROSdiabetes, Type 2Hypothyroidism   Renal/GU negative Renal ROS  negative genitourinary   Musculoskeletal negative musculoskeletal ROS (+)   Abdominal (+) + obese,   Peds negative pediatric ROS (+)  Hematology negative hematology ROS (+)   Anesthesia Other Findings   Reproductive/Obstetrics negative OB ROS                             Anesthesia Physical Anesthesia Plan  ASA: III  Anesthesia Plan: MAC   Post-op Pain Management:    Induction: Intravenous  Airway Management Planned: Nasal Cannula  Additional Equipment:   Intra-op Plan:   Post-operative Plan:   Informed Consent: I have reviewed the patients History and Physical, chart, labs and discussed the procedure including the risks, benefits and alternatives for the proposed anesthesia with the patient or authorized representative who has indicated his/her understanding and acceptance.     Plan Discussed with:   Anesthesia Plan Comments:          Anesthesia Quick Evaluation  

## 2017-04-14 NOTE — H&P (Signed)
The patient was re examined and there is no change in the patients condition since the original H and P. 

## 2017-04-14 NOTE — Transfer of Care (Signed)
Immediate Anesthesia Transfer of Care Note  Patient: Yesenia Reynolds  Procedure(s) Performed: Procedure(s) with comments: CATARACT EXTRACTION PHACO AND INTRAOCULAR LENS PLACEMENT RIGHT EYE (Right) - CDE: 8.52  Patient Location: Short Stay  Anesthesia Type:MAC  Level of Consciousness: awake, alert , oriented and patient cooperative  Airway & Oxygen Therapy: Patient Spontanous Breathing  Post-op Assessment: Report given to RN and Post -op Vital signs reviewed and stable  Post vital signs: Reviewed and stable  Last Vitals:  Vitals:   04/14/17 0859  BP: (!) 182/67  Pulse: 67  Resp: (!) 24  Temp: 37.1 C    Last Pain:  Vitals:   04/14/17 0859  TempSrc: Oral  PainSc: 0-No pain      Patients Stated Pain Goal: 5 (98/92/11 9417)  Complications: No apparent anesthesia complications

## 2017-04-14 NOTE — Anesthesia Procedure Notes (Signed)
Procedure Name: MAC Date/Time: 04/14/2017 9:32 AM Performed by: Andree Elk, AMY A Pre-anesthesia Checklist: Patient identified, Timeout performed, Emergency Drugs available, Suction available and Patient being monitored Oxygen Delivery Method: Nasal cannula

## 2017-04-14 NOTE — Op Note (Signed)
Patient brought to the operating room and prepped and draped in the usual manner.  Lid speculum inserted in right eye.  Stab incision made at the twelve o'clock position.  Provisc instilled in the anterior chamber.   A 2.4 mm. Stab incision was made temporally.  An anterior capsulotomy was done with a bent 25 gauge needle.  The nucleus was hydrodissected.  The Phaco tip was inserted in the anterior chamber and the nucleus was emulsified.  CDE was 8.52.  The cortical material was then removed with the I and A tip.  Posterior capsule was the polished.  The anterior chamber was deepened with Provisc.  A 16.0 Diopter Alcon [id number] IOL was then inserted in the capsular bag.  Provisc was then removed with the I and A tip.  The wound was then hydrated.  Patient sent to the Recovery Room in good condition with follow up in my office.  Preoperative Diagnosis:  Nuclear Cataract OD Postoperative Diagnosis:  Same Procedure name: Kelman Phacoemulsification OD with IOL

## 2017-04-14 NOTE — Anesthesia Postprocedure Evaluation (Signed)
Anesthesia Post Note  Patient: Yesenia Reynolds  Procedure(s) Performed: Procedure(s) (LRB): CATARACT EXTRACTION PHACO AND INTRAOCULAR LENS PLACEMENT RIGHT EYE (Right)  Patient location during evaluation: Short Stay Anesthesia Type: MAC Level of consciousness: awake and alert and oriented Pain management: pain level controlled Vital Signs Assessment: post-procedure vital signs reviewed and stable Respiratory status: spontaneous breathing and respiratory function stable Cardiovascular status: stable Postop Assessment: no signs of nausea or vomiting Anesthetic complications: no     Last Vitals:  Vitals:   04/14/17 0859  BP: (!) 182/67  Pulse: 67  Resp: (!) 24  Temp: 37.1 C    Last Pain:  Vitals:   04/14/17 0859  TempSrc: Oral  PainSc: 0-No pain                 [name] A

## 2017-04-15 ENCOUNTER — Encounter (HOSPITAL_COMMUNITY): Payer: Self-pay | Admitting: Ophthalmology

## 2017-04-20 ENCOUNTER — Other Ambulatory Visit (HOSPITAL_COMMUNITY): Payer: Self-pay | Admitting: Oncology

## 2017-04-20 ENCOUNTER — Telehealth (HOSPITAL_COMMUNITY): Payer: Self-pay | Admitting: *Deleted

## 2017-04-20 DIAGNOSIS — C541 Malignant neoplasm of endometrium: Secondary | ICD-10-CM

## 2017-04-20 MED ORDER — FENTANYL 75 MCG/HR TD PT72: 75.0000 ug | MEDICATED_PATCH | TRANSDERMAL | 0 refills | Status: DC

## 2017-04-20 NOTE — Telephone Encounter (Signed)
Rx printed

## 2017-04-24 DIAGNOSIS — R2689 Other abnormalities of gait and mobility: Secondary | ICD-10-CM | POA: Diagnosis not present

## 2017-04-24 DIAGNOSIS — M47817 Spondylosis without myelopathy or radiculopathy, lumbosacral region: Secondary | ICD-10-CM | POA: Diagnosis not present

## 2017-04-29 ENCOUNTER — Encounter (HOSPITAL_COMMUNITY): Payer: Self-pay | Admitting: Psychiatry

## 2017-04-29 ENCOUNTER — Ambulatory Visit (INDEPENDENT_AMBULATORY_CARE_PROVIDER_SITE_OTHER): Payer: Medicare Other | Admitting: Psychiatry

## 2017-04-29 VITALS — BP 169/83 | HR 94 | Ht 62.0 in | Wt 168.2 lb

## 2017-04-29 DIAGNOSIS — F411 Generalized anxiety disorder: Secondary | ICD-10-CM

## 2017-04-29 DIAGNOSIS — R2689 Other abnormalities of gait and mobility: Secondary | ICD-10-CM | POA: Diagnosis not present

## 2017-04-29 DIAGNOSIS — M47817 Spondylosis without myelopathy or radiculopathy, lumbosacral region: Secondary | ICD-10-CM | POA: Diagnosis not present

## 2017-04-29 DIAGNOSIS — F329 Major depressive disorder, single episode, unspecified: Secondary | ICD-10-CM

## 2017-04-29 MED ORDER — ESCITALOPRAM OXALATE 20 MG PO TABS: 20.0000 mg | ORAL_TABLET | Freq: Every day | ORAL | 2 refills | Status: DC

## 2017-04-29 MED ORDER — LAMOTRIGINE 100 MG PO TABS: ORAL_TABLET | ORAL | 2 refills | Status: DC

## 2017-04-29 NOTE — Progress Notes (Signed)
Psychiatric Initial Adult Assessment   Patient Identification: Yesenia Reynolds MRN:  [id number] Date of Evaluation:  04/29/2017 Referral Source: Dr. Abran Duke, oncologist at [facility] Chief Complaint:   Chief Complaint    Depression; Anxiety; Follow-up     Visit Diagnosis:    ICD-9-CM ICD-10-CM   1. Generalized anxiety disorder 300.02 F41.1     History of Present Illness:  This patient is a 75 year old single white female who lives with a female housemate in [place]. 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  The patient was referred by her oncologist for further treatment and assessment of severe anxiety.  The patient states that she has had no prior psychiatric assessment or treatment. She began to have endometrial bleeding in April 2016. She was found to have endometrial cancer and underwent hysterectomy. Following that she had 2 bouts of chemotherapy followed by radiation and eventually intravaginal radiation. She states that she finished all these treatments in October of 2016. She states that she did very well throughout the treatments and stayed calm and did not have significant problems with nausea and vomiting.  In November 2016 she began to have significant problems with anxiety. She didn't want anyone coming around and visiting her she felt extremely anxious being around people are going out to restaurants or to church. She is been a very gregarious person all her life and her personality totally changed. She denied being depressed sad or crying. She was shaking all the time and couldn't stay alone. She still can't stay alone. She stopped driving her car. She gave up things she enjoyed like going to her nephew's baseball games. She also could not watch anything frightening like police shows on TV because she would become extremely anxious. She is eating fairly well but lost a good deal of weight during her treatments. She is sleeping well  and denies nightmares. She states that she had short-term memory loss shortly after treatment but this is gotten much better.  She was tried on Paxil but it was not helpful. Her doctor has her on Xanax 0.25 mg which does help but she takes it as needed. Her gynecologist put her on Lamictal and she is up to 150 mg a day. She thinks this is helped more than anything else because she is less anxious than she was and is slowly starting to drive a little bit and be around a few people at a time. Her hand still shake at times. She would like to get out and do all the things she used to do such as drive on her own go to baseball games enjoy her friends and church but she is beginning to realize that this is going to be a slow process. She denies suicidal ideation or auditory or visual hallucinations  The patient returns after 6 weeks. She is by herself today. To be doing better. She is more upbeat and talkative. She's gone out with friends to eat a couple of times and felt comfortable. She still staying at the assisted living facility. She would like to go home but doesn't feel ready to care for her roommate so they're both still living in facilities for now. She is sleeping and eating well and continues in PT. She still little shaky today but overall is much less anxious and depressed.   Associated Signs/Symptoms: Depression Symptoms:  psychomotor agitation, difficulty concentrating, panic attacks,  Anxiety Symptoms:  Agoraphobia, Excessive Worry, Panic Symptoms,  Social Anxiety,   Past Psychiatric History: none  Previous Psychotropic Medications: Yes   Substance Abuse History in the last 12 months:  No.  Consequences of Substance Abuse: NA  Past Medical History:  Past Medical History:  Diagnosis Date  . Agoraphobia with panic attacks 08/07/2016  . Anxiety   . Arthritis   . Atrial fibrillation, currently in sinus rhythm   . Cancer [facility])    Endometrial  . Depression   . Endometrial  cancer, FIGO stage IIIC ([place]) 08/08/2015  . Essential hypertension, benign   . GERD (gastroesophageal reflux disease)   . Hypothyroidism   . Mixed hyperlipidemia   . Palpitations   . Spinal stenosis   . Spinal stenosis of lumbar region at multiple levels 08/07/2016  . Type 2 diabetes mellitus ([facility])     Past Surgical History:  Procedure Laterality Date  . ABDOMINAL HYSTERECTOMY    . BREAST BIOPSY     benign  . CATARACT EXTRACTION W/PHACO Left 03/31/2017   Procedure: CATARACT EXTRACTION PHACO AND INTRAOCULAR LENS PLACEMENT (IOC);  Surgeon: Rutherford Guys, MD;  Location: [facility];  Service: Ophthalmology;  Laterality: Left;  CDE: 8.67  . CATARACT EXTRACTION W/PHACO Right 04/14/2017   Procedure: CATARACT EXTRACTION PHACO AND INTRAOCULAR LENS PLACEMENT RIGHT EYE;  Surgeon: Rutherford Guys, MD;  Location: [facility];  Service: Ophthalmology;  Laterality: Right;  CDE: 8.52  . LIPOMA EXCISION    . Multiple dental extractions    . TONSILLECTOMY AND ADENOIDECTOMY    . TOTAL KNEE ARTHROPLASTY  08/04/2012   Procedure: TOTAL KNEE ARTHROPLASTY;  Surgeon: Gearlean Alf, MD;  Location: [facility];  Service: Orthopedics;  Laterality: Left;    Family Psychiatric History: none  Family History:  Family History  Problem Relation Age of Onset  . Stroke Mother   . Heart failure Mother   . Hypertension Father   . Coronary artery disease Father     Social History:   Social History   Social History  . Marital status: Single    Spouse name: N/A  . Number of children: N/A  . Years of education: N/A   Social History Main Topics  . Smoking status: Never Smoker  . Smokeless tobacco: Never Used  . Alcohol use No     Comment: 07/10/2016 per pt no  . Drug use: No     Comment: 8/3/2017per pt no   . Sexual activity: No   Other Topics Concern  . None   Social History Narrative  . None    Additional Social History: The patient grew up in [place] with both parents and one younger sister. She states that she had an  excellent childhood with no history of trauma or abuse. 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 She is never married and has lived with the same friend for 40 years. As a high school student and a younger person she was very active in sports such as softball and basketball and still very much enjoys watching the sports  Allergies:   Allergies  Allergen Reactions  . Morphine And Related Other (See Comments)    Hypotension  . Other     Preservative in latanoprost causes redness, pt uses preservative free latanoprost   . Paxil [Paroxetine Hcl]     unknown  . Latex Rash    Metabolic Disorder Labs: No results found for: HGBA1C, MPG No results found for: PROLACTIN No results found for:  CHOL, TRIG, HDL, CHOLHDL, VLDL, LDLCALC   Current Medications: Current Outpatient Prescriptions  Medication Sig Dispense Refill  . acetaminophen (TYLENOL 8 HOUR ARTHRITIS PAIN) 650 MG CR tablet Take 650 mg by mouth every 8 (eight) hours as needed for pain.    Marland Kitchen alum & mag hydroxide-simeth (MAALOX/MYLANTA) 200-200-20 MG/5ML suspension Take 30 mLs by mouth every 6 (six) hours as needed for indigestion or heartburn.    Marland Kitchen aspirin 81 MG tablet Take 81 mg by mouth at bedtime.     Marland Kitchen atorvastatin (LIPITOR) 10 MG tablet Take 10 mg by mouth every evening.     . calcium carbonate (TUMS - DOSED IN MG ELEMENTAL CALCIUM) 500 MG chewable tablet Chew 1 tablet by mouth daily as needed for indigestion or heartburn.    . escitalopram (LEXAPRO) 20 MG tablet Take 1 tablet (20 mg total) by mouth daily. 30 tablet 2  . fentaNYL (DURAGESIC - DOSED MCG/HR) 75 MCG/HR Place 1 patch (75 mcg total) onto the skin every 3 (three) days. 10 patch 0  . ibuprofen (ADVIL,MOTRIN) 200 MG tablet Take 200 mg by mouth 2 (two) times daily as needed for moderate pain.     Marland Kitchen lamoTRIgine (LAMICTAL) 100 MG tablet Taking 0.5 Tablets in AM and 1 Tablet in PM 60 tablet 2  . latanoprost  (XALATAN) 0.005 % ophthalmic solution Place 1 drop into both eyes at bedtime.    Marland Kitchen levothyroxine (SYNTHROID, LEVOTHROID) 88 MCG tablet Take 88 mcg by mouth daily before breakfast.     . lidocaine-prilocaine (EMLA) cream Apply 1 application topically daily as needed (prior to port being accessed/flushed).     . lisinopril (PRINIVIL,ZESTRIL) 10 MG tablet Take 10 mg by mouth every morning.     . loratadine (CLARITIN) 10 MG tablet Take 10 mg by mouth daily as needed for allergies.    . magnesium oxide (MAG-OX) 400 MG tablet Take 400 mg by mouth 2 (two) times daily.    . metoprolol succinate (TOPROL-XL) 25 MG 24 hr tablet TAKE 1/2 TABLET BY MOUTH EVERY MORNING (PATIENT NEEDS OFFICE VISIT PER MD) (Patient taking differently: TAKE 1/2 TABLET BY MOUTH EVERY MORNING) 45 tablet 3  . polyethylene glycol (MIRALAX / GLYCOLAX) packet Take 17 g by mouth daily as needed for mild constipation.      No current facility-administered medications for this visit.     Neurologic: Headache: No Seizure: No Paresthesias:No  Musculoskeletal: Strength & Muscle Tone: within normal limits Gait & Station: unsteady Patient leans: N/A  Psychiatric Specialty Exam: Review of Systems  Musculoskeletal: Positive for back pain.  Psychiatric/Behavioral: The patient is nervous/anxious.   All other systems reviewed and are negative.   Blood pressure (!) 169/83, pulse 94, height 5\' 2"  (1.575 m), weight 168 lb 3.2 oz (76.3 kg).Body mass index is 30.76 kg/m.  General Appearance: Casual, Neat and Well Groomed walking with a walker And seems to be walking more easily   Eye Contact:  Good  Speech:  Clear and Coherent  Volume:  Normal  Mood:  Good   Affect: Bright, hands are shaking a bit but she states she is nervous about coming here   Thought Process:  Goal Directed  Orientation:  Full (Time, Place, and Person)  Thought Content:  Rumination  Suicidal Thoughts:  No  Homicidal Thoughts:  No  Memory:  Immediate;    Good Recent;   Good Remote;   Good  Judgement:  Fair  Insight:  Good  Psychomotor Activity:  Normal  Concentration:  Concentration: Good and Attention Span: Fair  Recall:  Good  Fund of Knowledge:Good  Language: Good  Akathisia:  No  Handed:  Right  AIMS (if indicated):    Assets:  Communication Skills Desire for Improvement Resilience Social Support Talents/Skills  ADL's:  Intact  Cognition: WNL  Sleep:  ok    Treatment Plan Summary: Medication management   The patient will continue Lamictal 50 mg in the morning and 100 mg at bedtime. She'll Continue Lexapro  20 mg daily because of depression and anxiety symptoms. Her gynecologist has her completely off clonazepam She'll return to see me in 2 months and we'll continue her counseling   [name], MD 5/23/20188:52 AM Patient ID: [name], female   DOB: 1941/02/21, 76 y.o.   MRN: [id number]

## 2017-04-30 DIAGNOSIS — M47817 Spondylosis without myelopathy or radiculopathy, lumbosacral region: Secondary | ICD-10-CM | POA: Diagnosis not present

## 2017-04-30 DIAGNOSIS — R2689 Other abnormalities of gait and mobility: Secondary | ICD-10-CM | POA: Diagnosis not present

## 2017-05-01 ENCOUNTER — Encounter (HOSPITAL_COMMUNITY): Payer: Self-pay | Admitting: Psychiatry

## 2017-05-01 ENCOUNTER — Ambulatory Visit (INDEPENDENT_AMBULATORY_CARE_PROVIDER_SITE_OTHER): Payer: Medicare Other | Admitting: Psychiatry

## 2017-05-01 DIAGNOSIS — F411 Generalized anxiety disorder: Secondary | ICD-10-CM | POA: Diagnosis not present

## 2017-05-01 NOTE — Progress Notes (Signed)
Patient:  Yesenia Reynolds   DOB: [birth year]  MR Number: [id number]  Location: [place]:  [address] [place],  [place], [address]  Start:    Friday 05/01/2017 9:10 AM  End:  Friday 05/01/2017 9:56 AM    Provider/Observer:     Maurice Small, MSW, LCSW   Chief Complaint:                Chief Complaint  Patient presents with  . Anxiety    Reason For Service:     [name] is a 76 year old female who presents with symptoms of anxiety that began after she had treatment for cancer in 2016. Prior to treatment, patient reports being very independent and outgoing. After treatment, she became fearful of staying alone, driving, and being away from home. She reports excessive worry and says she developed shakiness. She also reports nervousness and decreased social involvement.  Interventions Strategy:  Supportive       Participation Level:   Active  Participation Quality:  Appropriate      Behavioral Observation:  Casual, Alert, nervous, restless,   Current Psychosocial Factors: Patient recently moved in to a senior living community, Housemate has multiple health issues and  was admitted to rehab facility after recent hospitalization,   Content of Session:   reviewed symptoms, praised and reinforced patient's increased activity and social involvement, assisted patient identify the effects of increased activity and social involvement on level of depression and anxiety, assisted patient identify other activity she wants to pursue which is attending church, assisted patient identify thoughts and processes regarding attending church, assisted patient identify, challenge, and replace negative thoughts with healthy alternatives, developed with patient plan to resume attending church   Current Status:   decreased anxiety, decreased excessive worry, much improved mood, hands/arms continue to tremble  Suicidal/Homicidal:    No          Patient Progress:   Good. Patient reports  much improved mood and decreased anxiety since last session. She has gone out to eat 3 x with family and/or friends since last session. She is very pleased with her efforts and reports enjoying doing this. She denies any feelings of nervousness or anxiety when going out to eat. She also is starting to feel more comfortable being at her appointments alone and is alone for today's appointment. She reports minimal anxiety about attending today's appointment. She  has decided against attending great nephew's graduation mainly due to logistics rather than anxiety. She wants to resume attending church by starting out with going to noon day Bible Study once per week. Patient reports continued  decreased worry about situation with her roommate since last session. She reports increased acceptance of her current placement.   Target Goals:   1. Learn and implement relapse prevention strategies for managing possible future anxiety symptoms  Last Reviewed:   02/11/2017  Goals Addressed Today:    1  Plan         :                   Return again in  2 weeks.  Impression/Diagnosis:  Patient presents with symptoms of anxiety that began after she had treatment for cancer in 2016. She reports excessive worry, motor tension, and social withdrawal.  She worries about a variety of issues.      Diagnosis:  Axis I: Generalized anxiety disorder          Axis II: Deferred   [name],  LCSW 05/01/2017

## 2017-05-06 DIAGNOSIS — R2689 Other abnormalities of gait and mobility: Secondary | ICD-10-CM | POA: Diagnosis not present

## 2017-05-06 DIAGNOSIS — M47817 Spondylosis without myelopathy or radiculopathy, lumbosacral region: Secondary | ICD-10-CM | POA: Diagnosis not present

## 2017-05-08 DIAGNOSIS — M47817 Spondylosis without myelopathy or radiculopathy, lumbosacral region: Secondary | ICD-10-CM | POA: Diagnosis not present

## 2017-05-08 DIAGNOSIS — R2689 Other abnormalities of gait and mobility: Secondary | ICD-10-CM | POA: Diagnosis not present

## 2017-05-12 DIAGNOSIS — R2689 Other abnormalities of gait and mobility: Secondary | ICD-10-CM | POA: Diagnosis not present

## 2017-05-12 DIAGNOSIS — M47817 Spondylosis without myelopathy or radiculopathy, lumbosacral region: Secondary | ICD-10-CM | POA: Diagnosis not present

## 2017-05-15 DIAGNOSIS — M47817 Spondylosis without myelopathy or radiculopathy, lumbosacral region: Secondary | ICD-10-CM | POA: Diagnosis not present

## 2017-05-15 DIAGNOSIS — R2689 Other abnormalities of gait and mobility: Secondary | ICD-10-CM | POA: Diagnosis not present

## 2017-05-19 DIAGNOSIS — M47817 Spondylosis without myelopathy or radiculopathy, lumbosacral region: Secondary | ICD-10-CM | POA: Diagnosis not present

## 2017-05-19 DIAGNOSIS — R2689 Other abnormalities of gait and mobility: Secondary | ICD-10-CM | POA: Diagnosis not present

## 2017-05-20 DIAGNOSIS — M47817 Spondylosis without myelopathy or radiculopathy, lumbosacral region: Secondary | ICD-10-CM | POA: Diagnosis not present

## 2017-05-20 DIAGNOSIS — R2689 Other abnormalities of gait and mobility: Secondary | ICD-10-CM | POA: Diagnosis not present

## 2017-05-21 ENCOUNTER — Other Ambulatory Visit (HOSPITAL_COMMUNITY): Payer: Self-pay | Admitting: Oncology

## 2017-05-21 DIAGNOSIS — C541 Malignant neoplasm of endometrium: Secondary | ICD-10-CM

## 2017-05-21 MED ORDER — FENTANYL 75 MCG/HR TD PT72: 75.0000 ug | MEDICATED_PATCH | TRANSDERMAL | 0 refills | Status: DC

## 2017-05-22 DIAGNOSIS — M47817 Spondylosis without myelopathy or radiculopathy, lumbosacral region: Secondary | ICD-10-CM | POA: Diagnosis not present

## 2017-05-22 DIAGNOSIS — R2689 Other abnormalities of gait and mobility: Secondary | ICD-10-CM | POA: Diagnosis not present

## 2017-05-28 ENCOUNTER — Encounter (HOSPITAL_COMMUNITY): Payer: Self-pay

## 2017-05-28 ENCOUNTER — Encounter (HOSPITAL_COMMUNITY): Payer: Medicare Other | Attending: Oncology

## 2017-05-28 VITALS — BP 137/59 | HR 73 | Temp 99.0°F | Resp 20

## 2017-05-28 DIAGNOSIS — Z452 Encounter for adjustment and management of vascular access device: Secondary | ICD-10-CM

## 2017-05-28 DIAGNOSIS — Z95828 Presence of other vascular implants and grafts: Secondary | ICD-10-CM

## 2017-05-28 DIAGNOSIS — C541 Malignant neoplasm of endometrium: Secondary | ICD-10-CM | POA: Diagnosis not present

## 2017-05-28 MED ORDER — HEPARIN SOD (PORK) LOCK FLUSH 100 UNIT/ML IV SOLN
INTRAVENOUS | Status: AC
  Filled 2017-05-28: qty 5

## 2017-05-28 MED ORDER — SODIUM CHLORIDE 0.9% FLUSH
10.0000 mL | INTRAVENOUS | Status: DC | PRN
  Administered 2017-05-28: 10 mL via INTRAVENOUS
  Filled 2017-05-28: qty 10

## 2017-05-28 MED ORDER — HEPARIN SOD (PORK) LOCK FLUSH 100 UNIT/ML IV SOLN
500.0000 [IU] | Freq: Once | INTRAVENOUS | Status: AC
  Administered 2017-05-28: 500 [IU] via INTRAVENOUS

## 2017-05-28 NOTE — Patient Instructions (Signed)
[place] at [facility] Discharge Instructions  RECOMMENDATIONS MADE BY THE CONSULTANT AND ANY TEST RESULTS WILL BE SENT TO YOUR REFERRING PHYSICIAN.  You had your port flushed today You picked up your prescription for Fentanyl Follow up as scheduled in August.  Thank you for choosing [place] at [facility] to provide your oncology and hematology care.  To afford each patient quality time with our provider, please arrive at least 15 minutes before your scheduled appointment time.    If you have a lab appointment with the [facility] please come in thru the  Main Entrance and check in at the main information desk  You need to re-schedule your appointment should you arrive 10 or more minutes late.  We strive to give you quality time with our providers, and arriving late affects you and other patients whose appointments are after yours.  Also, if you no show three or more times for appointments you may be dismissed from the clinic at the providers discretion.     Again, thank you for choosing [facility].  Our hope is that these requests will decrease the amount of time that you wait before being seen by our physicians.       _____________________________________________________________  Should you have questions after your visit to [facility], please contact our office at [phone] between the hours of 8:30 a.m. and 4:30 p.m.  Voicemails left after 4:30 p.m. will not be returned until the following business day.  For prescription refill requests, have your pharmacy contact our office.       Resources For Cancer Patients and their Caregivers ? American Cancer Society: Can assist with transportation, wigs, general needs, runs Look Good Feel Better.        315-488-2035 ? Cancer Care: Provides financial assistance, online support groups, medication/co-pay assistance.  1-800-813-HOPE 575-155-7988) ? [place] Assists [place] cancer patients and their families through emotional , educational and financial support.  514 279 1545 ? [place] DSS Where to apply for food stamps, Medicaid and utility assistance. 463-029-9235 ? [facility]: Transportation to medical appointments. 256-004-5151 ? Social Security Administration: May apply for disability if have a Stage IV cancer. 5634634547 415-783-4927 ? [employer], Disability and Transit Services: Assists with nutrition, care and transit needs. [place] Support Programs: @10RELATIVEDAYS @ > Cancer Support Group  2nd Tuesday of the month 1pm-2pm, Journey Room  > Creative Journey  3rd Tuesday of the month 1130am-1pm, Journey Room  > Look Good Feel Better  1st Wednesday of the month 10am-12 noon, Journey Room (Call [place] to register [phone])

## 2017-05-28 NOTE — Progress Notes (Signed)
Yesenia Reynolds presented for Portacath access and flush. Portacath located right chest wall accessed with  H 20 needle. Good blood return present. Portacath flushed with 74ml NS and 500U/72ml Heparin and needle removed intact. Procedure without incident. Patient tolerated procedure well. Patient discharged ambulatory via walker and in stable condition from clinic with family.

## 2017-05-29 ENCOUNTER — Encounter (HOSPITAL_COMMUNITY): Payer: Self-pay | Admitting: Psychiatry

## 2017-05-29 ENCOUNTER — Ambulatory Visit (INDEPENDENT_AMBULATORY_CARE_PROVIDER_SITE_OTHER): Payer: Medicare Other | Admitting: Psychiatry

## 2017-05-29 DIAGNOSIS — F411 Generalized anxiety disorder: Secondary | ICD-10-CM | POA: Diagnosis not present

## 2017-05-29 NOTE — Progress Notes (Signed)
  Patient:  [name]   DOB: 06/03/1941  MR Number: [id number]  Location: [facility]:  [address] [place],  [place], [address]  Start:    Friday 05/29/2017 9:15 AM  End:  Friday 05/29/2017 10:00 AM    Provider/Observer:     Maurice Small, MSW, LCSW     Chief Complaint:                Chief Complaint  Patient presents with  . Anxiety    Reason For Service:     [name] is a 76 year old female who presents with symptoms of anxiety that began after she had treatment for cancer in 2016. Prior to treatment, patient reports being very independent and outgoing. After treatment, she became fearful of staying alone, driving, and being away from home. She reports excessive worry and says she developed shakiness. She also reports nervousness and decreased social involvement.  Interventions Strategy:  Supportive       Participation Level:   Active  Participation Quality:  Appropriate      Behavioral Observation:  Casual, Alert, less anxious  Current Psychosocial Factors: Patient recently moved in to a senior living community, Housemate has multiple health issues and  was admitted to rehab facility after  hospitalization,   Content of Session:   reviewed symptoms, praised and reinforced patient's continued activity and social involvement, provided psychoeducation regarding anxiety, discussed connection between avoidance and anxiety,assisted patient identify the way she experiences anxiety in her body, discussed  fight flight response, discuss rationale for and practiced progressive muscle relaxation, assisted patient develop a schedule to practice consistently phys   Current Status:   decreased anxiety, decreased excessive worry, much improved mood, hands/arms continue to tremble  Suicidal/Homicidal:    No          Patient Progress:   Good. Patient reports much continued mood and decreased anxiety since last session. She has continued to socialize with family and  friends. She has gone out to dinner as well as gone on various outings. She has not attended church yet but says she has been busy doing other things. She has initiated contact with various people and continues to engage in various activities in her current residential placement. She reports she still is not ready to return to her home. She is contemplating meeting with her roommate to discuss future plans. Patient continues to experience nervousness but reports less avoidance of anxiety provoking situations. She reports decreased anxiety about the situations as she faces them.   Target Goals:   1. Learn and implement relapse prevention strategies for managing possible future anxiety symptoms  Last Reviewed:   02/11/2017  Goals Addressed Today:    1  Plan         :                   Return again in   3-4 weeks.  Impression/Diagnosis:  Patient presents with symptoms of anxiety that began after she had treatment for cancer in 2016. She reports excessive worry, motor tension, and social withdrawal.  She worries about a variety of issues.      Diagnosis:  Axis I: Generalized anxiety disorder          Axis II: Deferred   Thos Matsumoto, LCSW 05/29/2017

## 2017-06-04 ENCOUNTER — Encounter (HOSPITAL_COMMUNITY): Payer: Self-pay | Admitting: Psychiatry

## 2017-06-04 ENCOUNTER — Other Ambulatory Visit: Payer: Self-pay | Admitting: Cardiology

## 2017-06-08 DIAGNOSIS — R350 Frequency of micturition: Secondary | ICD-10-CM | POA: Diagnosis not present

## 2017-06-08 DIAGNOSIS — R600 Localized edema: Secondary | ICD-10-CM | POA: Diagnosis not present

## 2017-06-08 DIAGNOSIS — Z299 Encounter for prophylactic measures, unspecified: Secondary | ICD-10-CM | POA: Diagnosis not present

## 2017-06-08 DIAGNOSIS — C55 Malignant neoplasm of uterus, part unspecified: Secondary | ICD-10-CM | POA: Diagnosis not present

## 2017-06-08 DIAGNOSIS — Z6833 Body mass index (BMI) 33.0-33.9, adult: Secondary | ICD-10-CM | POA: Diagnosis not present

## 2017-06-15 DIAGNOSIS — Z8542 Personal history of malignant neoplasm of other parts of uterus: Secondary | ICD-10-CM | POA: Diagnosis not present

## 2017-06-16 DIAGNOSIS — R2689 Other abnormalities of gait and mobility: Secondary | ICD-10-CM | POA: Diagnosis not present

## 2017-06-16 DIAGNOSIS — M47817 Spondylosis without myelopathy or radiculopathy, lumbosacral region: Secondary | ICD-10-CM | POA: Diagnosis not present

## 2017-06-19 ENCOUNTER — Encounter (HOSPITAL_COMMUNITY): Payer: Self-pay | Admitting: Psychiatry

## 2017-06-19 ENCOUNTER — Ambulatory Visit (INDEPENDENT_AMBULATORY_CARE_PROVIDER_SITE_OTHER): Payer: Medicare Other | Admitting: Psychiatry

## 2017-06-19 DIAGNOSIS — F411 Generalized anxiety disorder: Secondary | ICD-10-CM

## 2017-06-19 NOTE — Progress Notes (Signed)
Patient:  Yesenia Reynolds   DOB: 02/18/1941  MR Number: [id number]  Location: [facility]:  [address] [place],  [place], [address]  Start:    Friday 06/19/2017 9:13 AM End:  Friday 06/19/2017 9:53 AM    Provider/Observer:     Maurice Small, MSW, LCSW     Chief Complaint:                Chief Complaint  Patient presents with  . Anxiety    Reason For Service:     [name] is a 76 year old female who presents with symptoms of anxiety that began after she had treatment for cancer in 2016. Prior to treatment, patient reports being very independent and outgoing. After treatment, she became fearful of staying alone, driving, and being away from home. She reports excessive worry and says she developed shakiness. She also reports nervousness and decreased so  cial involvement.  Interventions Strategy:  Supportive       Participation Level:   Active  Participation Quality:  Appropriate      Behavioral Observation:  Casual, Alert, less anxious, tearful at times  Current Psychosocial Factors: Patient recently moved in to a senior living community, Housemate has multiple health issues and  was admitted to rehab facility after  hospitalization,   Content of Session:   reviewed symptoms, praised and reinforced patient's continued activity and social involvement, praised patient's efforts to practice progressive muscle relaxation, discussed results, assigned patient to practice relaxation techniques daily, discussed stressors, facilitated expression of thoughts and feelings, encouraged patient to continue to engage in activities especially going out with others,     Current Status:   decreased anxiety, decreased excessive worry, much improved mood, hands/arms continue to tremble  Suicidal/Homicidal:    No          Patient Progress:   Good. Patient reports continued involvement in activities and increased socialization since last session. She reports still having bad days  at times normally one day out of the week. She reports sometimes feeling depressed as she continues to reside in the nursing home and states mainly staying in her room on those days. However, she reports she does remain engaged in activity in her room. She reports increased thoughts and anxiety about her future as she suspects her roommate is planning to move home soon. However, patient does not want to return to her home at this point as she suspects the roommate will expect her to do things that she is not capable of doing. She also expresses hurt, sadness, and frustration her roommate has not asked about her welfare. Patient reports stress due to to having limited options about her future  as she says she cannot afford to continue staying at the nursing home and cannot stay with friends or family.  Target Goals:   1. Learn and implement relapse prevention strategies for managing possible future anxiety symptoms  Last Reviewed:   02/11/2017  Goals Addressed Today:    1  Plan                  Return again in   3-4 weeks.  Impression/Diagnosis:  Patient presents with symptoms of anxiety that began after she had treatment for cancer in 2016. She reports excessive worry, motor tension, and social withdrawal.  She worries about a variety of issues.      Diagnosis:  Axis I: Generalized anxiety disorder          Axis II:  Deferred   BYNUM,PEGGY, LCSW 06/19/2017

## 2017-06-22 ENCOUNTER — Telehealth (HOSPITAL_COMMUNITY): Payer: Self-pay | Admitting: *Deleted

## 2017-06-22 ENCOUNTER — Other Ambulatory Visit (HOSPITAL_COMMUNITY): Payer: Self-pay | Admitting: Oncology

## 2017-06-22 DIAGNOSIS — C541 Malignant neoplasm of endometrium: Secondary | ICD-10-CM

## 2017-06-22 MED ORDER — FENTANYL 75 MCG/HR TD PT72: 75.0000 ug | MEDICATED_PATCH | TRANSDERMAL | 0 refills | Status: DC

## 2017-06-23 ENCOUNTER — Ambulatory Visit (INDEPENDENT_AMBULATORY_CARE_PROVIDER_SITE_OTHER): Payer: Medicare Other | Admitting: Psychiatry

## 2017-06-23 ENCOUNTER — Ambulatory Visit (HOSPITAL_COMMUNITY): Payer: Self-pay | Admitting: Psychiatry

## 2017-06-23 ENCOUNTER — Encounter (HOSPITAL_COMMUNITY): Payer: Self-pay | Admitting: Psychiatry

## 2017-06-23 VITALS — BP 144/60 | HR 69 | Ht 62.0 in | Wt 176.2 lb

## 2017-06-23 DIAGNOSIS — F411 Generalized anxiety disorder: Secondary | ICD-10-CM | POA: Diagnosis not present

## 2017-06-23 MED ORDER — ESCITALOPRAM OXALATE 20 MG PO TABS: 20.0000 mg | ORAL_TABLET | Freq: Every day | ORAL | 2 refills | Status: DC

## 2017-06-23 MED ORDER — LAMOTRIGINE 100 MG PO TABS: ORAL_TABLET | ORAL | 2 refills | Status: DC

## 2017-06-23 NOTE — Progress Notes (Signed)
Psychiatric Initial Adult Assessment   Patient Identification: Yesenia Reynolds MRN:  [id number] Date of Evaluation:  06/23/2017 Referral Source: Dr. Abran Duke, oncologist at [facility] health Chief Complaint:   Chief Complaint    Depression; Anxiety; Follow-up     Visit Diagnosis:    ICD-10-CM   1. Generalized anxiety disorder F41.1     History of Present Illness:  This patient is a 76 year old single white female who lives with a female housemate in [place]. 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  The patient was referred by her oncologist for further treatment and assessment of severe anxiety.  The patient states that she has had no prior psychiatric assessment or treatment. She began to have endometrial bleeding in April 2016. She was found to have endometrial cancer and underwent hysterectomy. Following that she had 2 bouts of chemotherapy followed by radiation and eventually intravaginal radiation. She states that she finished all these treatments in October of 2016. She states that she did very well throughout the treatments and stayed calm and did not have significant problems with nausea and vomiting.  In November 2016 she began to have significant problems with anxiety. She didn't want anyone coming around and visiting her she felt extremely anxious being around people are going out to restaurants or to church. She is been a very gregarious person all her life and her personality totally changed. She denied being depressed sad or crying. She was shaking all the time and couldn't stay alone. She still can't stay alone. She stopped driving her car. She gave up things she enjoyed like going to her nephew's baseball games. She also could not watch anything frightening like police shows on TV because she would become extremely anxious. She is eating fairly well but lost a good deal of weight during her treatments. She is sleeping well and denies  nightmares. She states that she had short-term memory loss shortly after treatment but this is gotten much better.  She was tried on Paxil but it was not helpful. Her doctor has her on Xanax 0.25 mg which does help but she takes it as needed. Her gynecologist put her on Lamictal and she is up to 150 mg a day. She thinks this is helped more than anything else because she is less anxious than she was and is slowly starting to drive a little bit and be around a few people at a time. Her hand still shake at times. She would like to get out and do all the things she used to do such as drive on her own go to baseball games enjoy her friends and church but she is beginning to realize that this is going to be a slow process. She denies suicidal ideation or auditory or visual hallucinations  The patient returns after 2 months. She states that she is doing better. She is walking well with her walker. She still in the assisted living facility and isn't in any rush to go home. Her old roommate apparently is going to go home fairly soon but she may let her stay there on her own for a while first. She states that her mood is good she's been getting out more with friends and family and she is even able to go to [employer]. Denies any thoughts of self-harm or crying spells. Her anxiety has lessened considerably   Associated Signs/Symptoms: Depression Symptoms:  psychomotor agitation, difficulty concentrating, panic attacks,  Anxiety Symptoms:  Agoraphobia, Excessive Worry, Panic Symptoms, Social Anxiety,   Past Psychiatric History: none  Previous Psychotropic Medications: Yes   Substance Abuse History in the last 12 months:  No.  Consequences of Substance Abuse: NA  Past Medical History:  Past Medical History:  Diagnosis Date  . Agoraphobia with panic attacks 08/07/2016  . Anxiety   . Arthritis   . Atrial fibrillation, currently in sinus rhythm   . Cancer [facility])    Endometrial  . Depression   .  Endometrial cancer, FIGO stage IIIC ([place]) 08/08/2015  . Essential hypertension, benign   . GERD (gastroesophageal reflux disease)   . Hypothyroidism   . Mixed hyperlipidemia   . Palpitations   . Spinal stenosis   . Spinal stenosis of lumbar region at multiple levels 08/07/2016  . Type 2 diabetes mellitus ([place])     Past Surgical History:  Procedure Laterality Date  . ABDOMINAL HYSTERECTOMY    . BREAST BIOPSY     benign  . CATARACT EXTRACTION W/PHACO Left 03/31/2017   Procedure: CATARACT EXTRACTION PHACO AND INTRAOCULAR LENS PLACEMENT (IOC);  Surgeon: Rutherford Guys, MD;  Location: [facility];  Service: Ophthalmology;  Laterality: Left;  CDE: 8.67  . CATARACT EXTRACTION W/PHACO Right 04/14/2017   Procedure: CATARACT EXTRACTION PHACO AND INTRAOCULAR LENS PLACEMENT RIGHT EYE;  Surgeon: Rutherford Guys, MD;  Location: [facility];  Service: Ophthalmology;  Laterality: Right;  CDE: 8.52  . LIPOMA EXCISION    . Multiple dental extractions    . TONSILLECTOMY AND ADENOIDECTOMY    . TOTAL KNEE ARTHROPLASTY  08/04/2012   Procedure: TOTAL KNEE ARTHROPLASTY;  Surgeon: Gearlean Alf, MD;  Location: [facility];  Service: Orthopedics;  Laterality: Left;    Family Psychiatric History: none  Family History:  Family History  Problem Relation Age of Onset  . Stroke Mother   . Heart failure Mother   . Hypertension Father   . Coronary artery disease Father     Social History:   Social History   Social History  . Marital status: Single    Spouse name: N/A  . Number of children: N/A  . Years of education: N/A   Social History Main Topics  . Smoking status: Never Smoker  . Smokeless tobacco: Never Used  . Alcohol use No     Comment: 07/10/2016 per pt no  . Drug use: No     Comment: 8/3/2017per pt no   . Sexual activity: No   Other Topics Concern  . None   Social History Narrative  . None    Additional Social History: The patient grew up in [place] with both parents and one younger sister. She states that  she had an excellent childhood with no history of trauma or abuse. 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 She is never married and has lived with the same friend for 40 years. As a high school student and a younger person she was very active in sports such as softball and basketball and still very much enjoys watching the sports  Allergies:   Allergies  Allergen Reactions  . Morphine And Related Other (See Comments)    Hypotension  . Other     Preservative in latanoprost causes redness, pt uses preservative free latanoprost   . Paxil [Paroxetine Hcl]     unknown  . Latex Rash    Metabolic Disorder Labs: No results found for: HGBA1C, MPG No results found for:  PROLACTIN No results found for: CHOL, TRIG, HDL, CHOLHDL, VLDL, LDLCALC   Current Medications: Current Outpatient Prescriptions  Medication Sig Dispense Refill  . acetaminophen (TYLENOL 8 HOUR ARTHRITIS PAIN) 650 MG CR tablet Take 650 mg by mouth every 8 (eight) hours as needed for pain.    Marland Kitchen alum & mag hydroxide-simeth (MAALOX/MYLANTA) 200-200-20 MG/5ML suspension Take 30 mLs by mouth every 6 (six) hours as needed for indigestion or heartburn.    Marland Kitchen aspirin 81 MG tablet Take 81 mg by mouth at bedtime.     Marland Kitchen atorvastatin (LIPITOR) 10 MG tablet Take 10 mg by mouth every evening.     . calcium carbonate (TUMS - DOSED IN MG ELEMENTAL CALCIUM) 500 MG chewable tablet Chew 1 tablet by mouth daily as needed for indigestion or heartburn.    . escitalopram (LEXAPRO) 20 MG tablet Take 1 tablet (20 mg total) by mouth daily. 30 tablet 2  . fentaNYL (DURAGESIC - DOSED MCG/HR) 75 MCG/HR Place 1 patch (75 mcg total) onto the skin every 3 (three) days. 10 patch 0  . ibuprofen (ADVIL,MOTRIN) 200 MG tablet Take 200 mg by mouth 2 (two) times daily as needed for moderate pain.     Marland Kitchen lamoTRIgine (LAMICTAL) 100 MG tablet Taking 0.5 Tablets in AM and 1 Tablet in PM 60 tablet 2  .  latanoprost (XALATAN) 0.005 % ophthalmic solution Place 1 drop into both eyes at bedtime.    Marland Kitchen levothyroxine (SYNTHROID, LEVOTHROID) 88 MCG tablet Take 88 mcg by mouth daily before breakfast.     . lidocaine-prilocaine (EMLA) cream Apply 1 application topically daily as needed (prior to port being accessed/flushed).     . lisinopril (PRINIVIL,ZESTRIL) 10 MG tablet Take 10 mg by mouth every morning.     . loratadine (CLARITIN) 10 MG tablet Take 10 mg by mouth daily as needed for allergies.    . magnesium oxide (MAG-OX) 400 MG tablet Take 400 mg by mouth 2 (two) times daily.    . metoprolol succinate (TOPROL-XL) 25 MG 24 hr tablet TAKE 1/2 TABLET BY MOUTH EVERY MORNING (PATIENT NEEDS OFFICE VISIT PER MD) 15 tablet 0  . polyethylene glycol (MIRALAX / GLYCOLAX) packet Take 17 g by mouth daily as needed for mild constipation.      No current facility-administered medications for this visit.     Neurologic: Headache: No Seizure: No Paresthesias:No  Musculoskeletal: Strength & Muscle Tone: within normal limits Gait & Station: unsteady Patient leans: N/A  Psychiatric Specialty Exam: Review of Systems  Musculoskeletal: Positive for back pain.  Psychiatric/Behavioral: The patient is nervous/anxious.   All other systems reviewed and are negative.   Blood pressure (!) 144/60, pulse 69, height 5\' 2"  (1.575 m), weight 176 lb 3.2 oz (79.9 kg).Body mass index is 32.23 kg/m.  General Appearance: Casual, Neat and Well Groomed walking with a walker And seems to be walking more easily   Eye Contact:  Good  Speech:  Clear and Coherent  Volume:  Normal  Mood:  Good   Affect: Bright   Thought Process:  Goal Directed  Orientation:  Full (Time, Place, and Person)  Thought Content:  Rumination  Suicidal Thoughts:  No  Homicidal Thoughts:  No  Memory:  Immediate;   Good Recent;   Good Remote;   Good  Judgement:  Fair  Insight:  Good  Psychomotor Activity:  Normal  Concentration:  Concentration:  Good and Attention Span: Fair  Recall:  Good  Fund of Knowledge:Good  Language: Good  Akathisia:  No  Handed:  Right  AIMS (if indicated):    Assets:  Communication Skills Desire for Improvement Resilience Social Support Talents/Skills  ADL's:  Intact  Cognition: WNL  Sleep:  ok    Treatment Plan Summary: Medication management   The patient will continue Lamictal 50 mg in the morning and 100 mg at bedtime. She'll Continue Lexapro  20 mg daily because of depression and anxiety symptoms. Her gynecologist has her completely off clonazepam She'll return to see me in 3 months and we'll continue her counseling   [name], MD 7/17/20184:24 PM Patient ID: Yesenia Reynolds, female   DOB: [birth year], 76 y.o.   MRN: [id number]

## 2017-07-02 DIAGNOSIS — M47817 Spondylosis without myelopathy or radiculopathy, lumbosacral region: Secondary | ICD-10-CM | POA: Diagnosis not present

## 2017-07-02 DIAGNOSIS — M4306 Spondylolysis, lumbar region: Secondary | ICD-10-CM | POA: Diagnosis not present

## 2017-07-06 ENCOUNTER — Other Ambulatory Visit: Payer: Self-pay | Admitting: Cardiology

## 2017-07-16 ENCOUNTER — Encounter (HOSPITAL_COMMUNITY): Payer: Self-pay | Admitting: Psychiatry

## 2017-07-16 ENCOUNTER — Ambulatory Visit (INDEPENDENT_AMBULATORY_CARE_PROVIDER_SITE_OTHER): Payer: Medicare Other | Admitting: Psychiatry

## 2017-07-16 DIAGNOSIS — F411 Generalized anxiety disorder: Secondary | ICD-10-CM | POA: Diagnosis not present

## 2017-07-16 NOTE — Progress Notes (Signed)
  Patient:  Yesenia Reynolds   DOB: 27-Nov-1941  MR Number: [id number]  Location: [facility]:  [address] [place],  [place], [address]  Start:   Thursday 07/16/2017 10:08 AM  End: Thursday 07/16/2017 11:00 AM     Provider/Observer:     Maurice Small, MSW, LCSW     Chief Complaint:                Chief Complaint  Patient presents with  . Anxiety    Reason For Service:     JOURNIEE FELDKAMP is a 76 year old female who presents with symptoms of anxiety that began after she had treatment for cancer in 2016. Prior to treatment, patient reports being very independent and outgoing. After treatment, she became fearful of staying alone, driving, and being away from home. She reports excessive worry and says she developed shakiness. She also reports nervousness and decreased so  cial involvement.  Interventions Strategy:  Supportive       Participation Level:   Active  Participation Quality:  Appropriate      Behavioral Observation:  Casual, Alert, less anxious, tearful at times  Current Psychosocial Factors: Patient recently moved in to a senior living community, Housemate has multiple health issues and  was admitted to rehab facility after  hospitalization,   Content of Session:   reviewed symptoms, praised and reinforced patient's continued activity and social involvement, praised patient's efforts to practice progressive muscle relaxation, discussed results, assigned patient to practice relaxation techniques daily, discussed stressors, facilitated expression of thoughts and feelings, assisted patient identify, challenge and replace thoughts promoting exaggerated sense of responsibility with healthy alternatives, discussed and provided handout on basic personal rights to assist patient identify thoughts that promote effective assertion encouraged patient to continue to engage in activities especially going out with others,     Current Status:   decreased anxiety, decreased  excessive worry, much improved mood, hands/arms continue to tremble  Suicidal/Homicidal:    No          Patient Progress:   Good. Patient reports continued involvement in activities and increased socialization since last session. She reports still having bad days at times normally one day out of the week. She reports increased stress and worry today as she learned her roommate is a returning to their condo next week. Patient has decided she will not be returning to the condo at this time but expresses guilt that she will not be there for her roommate. She also expresses disappointment and frustration roommate has not really asked her about her welfare. Patient also reports increased thoughts and concerns about her future.  Target Goals:   1. Learn and implement relapse prevention strategies for managing possible future anxiety symptoms  Last Reviewed:   02/11/2017  Goals Addressed Today:    1  Plan                  Return again in   3-4 weeks.  Impression/Diagnosis:  Patient presents with symptoms of anxiety that began after she had treatment for cancer in 2016. She reports excessive worry, motor tension, and social withdrawal.  She worries about a variety of issues.      Diagnosis:  Axis I: Generalized anxiety disorder          Axis II: Deferred   Vittoria Noreen, LCSW 07/16/2017

## 2017-07-21 ENCOUNTER — Other Ambulatory Visit: Payer: Self-pay | Admitting: Cardiology

## 2017-07-27 ENCOUNTER — Telehealth (HOSPITAL_COMMUNITY): Payer: Self-pay | Admitting: *Deleted

## 2017-07-28 ENCOUNTER — Other Ambulatory Visit (HOSPITAL_COMMUNITY): Payer: Self-pay | Admitting: Oncology

## 2017-07-28 DIAGNOSIS — C541 Malignant neoplasm of endometrium: Secondary | ICD-10-CM

## 2017-07-28 MED ORDER — FENTANYL 75 MCG/HR TD PT72: 75.0000 ug | MEDICATED_PATCH | TRANSDERMAL | 0 refills | Status: DC

## 2017-07-29 ENCOUNTER — Encounter (HOSPITAL_BASED_OUTPATIENT_CLINIC_OR_DEPARTMENT_OTHER): Payer: Medicare Other | Admitting: Adult Health

## 2017-07-29 ENCOUNTER — Encounter (HOSPITAL_COMMUNITY): Payer: Self-pay | Admitting: Adult Health

## 2017-07-29 ENCOUNTER — Ambulatory Visit (HOSPITAL_COMMUNITY): Payer: Self-pay | Admitting: Adult Health

## 2017-07-29 ENCOUNTER — Encounter (HOSPITAL_COMMUNITY): Payer: Medicare Other | Attending: Oncology

## 2017-07-29 VITALS — BP 155/48 | HR 68 | Temp 98.9°F | Resp 20 | Wt 184.7 lb

## 2017-07-29 DIAGNOSIS — F419 Anxiety disorder, unspecified: Secondary | ICD-10-CM

## 2017-07-29 DIAGNOSIS — N3941 Urge incontinence: Secondary | ICD-10-CM | POA: Diagnosis not present

## 2017-07-29 DIAGNOSIS — R609 Edema, unspecified: Secondary | ICD-10-CM

## 2017-07-29 DIAGNOSIS — C541 Malignant neoplasm of endometrium: Secondary | ICD-10-CM

## 2017-07-29 DIAGNOSIS — Z95828 Presence of other vascular implants and grafts: Secondary | ICD-10-CM | POA: Diagnosis not present

## 2017-07-29 DIAGNOSIS — G894 Chronic pain syndrome: Secondary | ICD-10-CM

## 2017-07-29 LAB — CBC WITH DIFFERENTIAL/PLATELET
BASOS PCT: 0 %
Basophils Absolute: 0 10*3/uL (ref 0.0–0.1)
EOS ABS: 0.1 10*3/uL (ref 0.0–0.7)
EOS PCT: 2 %
HCT: 36.3 % (ref 36.0–46.0)
HEMOGLOBIN: 11.6 g/dL — AB (ref 12.0–15.0)
LYMPHS ABS: 1 10*3/uL (ref 0.7–4.0)
Lymphocytes Relative: 21 %
MCH: 28.9 pg (ref 26.0–34.0)
MCHC: 32 g/dL (ref 30.0–36.0)
MCV: 90.3 fL (ref 78.0–100.0)
Monocytes Absolute: 0.5 10*3/uL (ref 0.1–1.0)
Monocytes Relative: 11 %
NEUTROS PCT: 66 %
Neutro Abs: 3.3 10*3/uL (ref 1.7–7.7)
PLATELETS: 194 10*3/uL (ref 150–400)
RBC: 4.02 MIL/uL (ref 3.87–5.11)
RDW: 13.2 % (ref 11.5–15.5)
WBC: 5.1 10*3/uL (ref 4.0–10.5)

## 2017-07-29 LAB — COMPREHENSIVE METABOLIC PANEL
ALK PHOS: 86 U/L (ref 38–126)
ALT: 13 U/L — ABNORMAL LOW (ref 14–54)
AST: 16 U/L (ref 15–41)
Albumin: 3.9 g/dL (ref 3.5–5.0)
Anion gap: 9 (ref 5–15)
BILIRUBIN TOTAL: 0.4 mg/dL (ref 0.3–1.2)
BUN: 14 mg/dL (ref 6–20)
CALCIUM: 8.8 mg/dL — AB (ref 8.9–10.3)
CO2: 30 mmol/L (ref 22–32)
CREATININE: 0.72 mg/dL (ref 0.44–1.00)
Chloride: 98 mmol/L — ABNORMAL LOW (ref 101–111)
GFR calc Af Amer: >60 mL/min (ref >60)
Glucose, Bld: 89 mg/dL (ref 65–99)
POTASSIUM: 4.2 mmol/L (ref 3.5–5.1)
Sodium: 137 mmol/L (ref 135–145)
TOTAL PROTEIN: 6.3 g/dL — AB (ref 6.5–8.1)

## 2017-07-29 MED ORDER — HEPARIN SOD (PORK) LOCK FLUSH 100 UNIT/ML IV SOLN
500.0000 [IU] | Freq: Once | INTRAVENOUS | Status: AC
  Administered 2017-07-29: 500 [IU] via INTRAVENOUS
  Filled 2017-07-29: qty 5

## 2017-07-29 MED ORDER — SODIUM CHLORIDE 0.9% FLUSH
10.0000 mL | Freq: Once | INTRAVENOUS | Status: AC
  Administered 2017-07-29: 10 mL via INTRAVENOUS

## 2017-07-29 NOTE — Progress Notes (Signed)
[address], [place] [address]   CLINIC:  Medical Oncology/Hematology  PCP:  Glenda Chroman, MD [place] [address] [phone]   REASON FOR VISIT:  Follow-up for Stage IIIC1 endometrial cancer   CURRENT THERAPY: Surveillance per NCCN Guidelines   BRIEF ONCOLOGIC HISTORY:    Endometrial cancer ([place])   02/16/2015 Initial Diagnosis    Endometrial cancer ([place]).  The endometrial cancer found on D&C, grade 2 by Dr. Barrie Dunker.      03/30/2015 Surgery    Robotic hysterectomy, bilateral salpingo-oophorectomy, sentinel lymph node mapping and resection, mini-laparotomy by Dr. Clenton Pare.      04/02/2015 Cancer Staging    Stage IIIc 1, grade 2      04/26/2015 - 10/03/2015 Chemotherapy    The patient had palonosetron (ALOXI) injection 0.25 mg, 0.25 mg, Intravenous,  Once, 6 of 6 cycles  CARBOplatin (PARAPLATIN) in sodium chloride 0.9 % 100 mL chemo infusion, , Intravenous,  Once, 6 of 6 cycles  PACLitaxel (TAXOL) 294 mg in dextrose 5 % 250 mL chemo infusion (> 80mg /m2), 175 mg/m2 = 294 mg, Intravenous,  Once, 6 of 6 cycles  for chemotherapy treatment.        06/27/2015 - 07/31/2015 Radiation Therapy    External beam radiation therapy      08/08/2015 - 08/21/2015 Radiation Therapy    Vaginal cuff intracavitary brachytherapy delivered, August 31, September 6, 08/21/2015. 18 gray in 3 fractions.      02/20/2016 Imaging    CT Abd/Pelvis- no evidence of metastatic disease status post hysterectomy. Improving fluid collection along the right pelvic sidewall, probably postoperative lymphocele or seroma.         INTERVAL HISTORY:  Yesenia Reynolds 76 y.o. female presents for routine follow-up for endometrial cancer.   She is here today with her sister. Ambulates with walker.   Overall, she tells me she has been feeling "pretty good."  Appetite 100%; energy levels 75%. She attributes her occasional fatigue "because there's not a whole lot to do  where I'm living." Currently is living in assisted living (was previously in retirement/more independent living); only living in assisted living for now because her house-mate was placed in rehab, so she is in assisted living for continued monitoring.  Denies any abdominal pain or distention, N&V, bowel changes, or pelvic pain/pressure.   Endorses urinary urgency/urge incontinence recently. Denies dysuria. She has some BLE edema, with her left ankle worse than her right. Her PCP gave her a "fluid pill that I took for 3 days, but I didn't lose more than 3 lbs so I don't know if it was really fluid or not."  Left ankle with mild pain as well. Denies any recent injury or falls to left ankle.   Reports recent urge urinary incontinence. "I dribble a little bit before I can get to the bathroom to pee."  Denies dysuria, frequency, or hematuria.  Denies any fever/chills.   She continues to see Dr. Harrington Challenger with [facility] for her anxiety/agoraphobia. Her sister is proud to report that Yesenia Reynolds has been able to be part of many more family activities in recent months; she went to a graduation party after a family member's graduation and stayed for the entirety of the party. This is new and pleasant change for patient.  She has been more social lately by playing [place] at her retirement community as well.      REVIEW OF SYSTEMS:  Review of Systems  Constitutional: Positive for fatigue. Negative for chills and fever.  HENT:  Negative.   Eyes: Negative.   Respiratory: Negative.   Cardiovascular: Positive for leg swelling.  Gastrointestinal: Negative.  Negative for abdominal pain, blood in stool, constipation, diarrhea, nausea and vomiting.  Endocrine: Negative.   Genitourinary: Positive for bladder incontinence. Negative for dysuria, hematuria and vaginal bleeding.   Musculoskeletal: Positive for back pain.       Chronic pain managed by Dr. Harrington Challenger   Skin: Negative.  Negative for rash.  Neurological:  Positive for dizziness (occasional). Negative for headaches.  Hematological: Negative.   Psychiatric/Behavioral: The patient is nervous/anxious.      PAST MEDICAL/SURGICAL HISTORY:  Past Medical History:  Diagnosis Date  . Agoraphobia with panic attacks 08/07/2016  . Anxiety   . Arthritis   . Atrial fibrillation, currently in sinus rhythm   . Cancer [facility])    Endometrial  . Depression   . Endometrial cancer, FIGO stage IIIC ([place]) 08/08/2015  . Essential hypertension, benign   . GERD (gastroesophageal reflux disease)   . Hypothyroidism   . Mixed hyperlipidemia   . Palpitations   . Spinal stenosis   . Spinal stenosis of lumbar region at multiple levels 08/07/2016  . Type 2 diabetes mellitus ([place])    Past Surgical History:  Procedure Laterality Date  . ABDOMINAL HYSTERECTOMY    . BREAST BIOPSY     benign  . CATARACT EXTRACTION W/PHACO Left 03/31/2017   Procedure: CATARACT EXTRACTION PHACO AND INTRAOCULAR LENS PLACEMENT (IOC);  Surgeon: Rutherford Guys, MD;  Location: [facility];  Service: Ophthalmology;  Laterality: Left;  CDE: 8.67  . CATARACT EXTRACTION W/PHACO Right 04/14/2017   Procedure: CATARACT EXTRACTION PHACO AND INTRAOCULAR LENS PLACEMENT RIGHT EYE;  Surgeon: Rutherford Guys, MD;  Location: [facility];  Service: Ophthalmology;  Laterality: Right;  CDE: 8.52  . LIPOMA EXCISION    . Multiple dental extractions    . TONSILLECTOMY AND ADENOIDECTOMY    . TOTAL KNEE ARTHROPLASTY  08/04/2012   Procedure: TOTAL KNEE ARTHROPLASTY;  Surgeon: Gearlean Alf, MD;  Location: [facility];  Service: Orthopedics;  Laterality: Left;     SOCIAL HISTORY:  Social History   Social History  . Marital status: Single    Spouse name: N/A  . Number of children: N/A  . Years of education: N/A   Occupational History  . Not on file.   Social History Main Topics  . Smoking status: Never Smoker  . Smokeless tobacco: Never Used  . Alcohol use No     Comment: 07/10/2016 per pt no  . Drug use: No     Comment:  8/3/2017per pt no   . Sexual activity: No   Other Topics Concern  . Not on file   Social History Narrative  . No narrative on file    FAMILY HISTORY:  Family History  Problem Relation Age of Onset  . Stroke Mother   . Heart failure Mother   . Hypertension Father   . Coronary artery disease Father     CURRENT MEDICATIONS:  Outpatient Encounter Prescriptions as of 07/29/2017  Medication Sig Note  . acetaminophen (TYLENOL 8 HOUR ARTHRITIS PAIN) 650 MG CR tablet Take 650 mg by mouth every 8 (eight) hours as needed for pain.   Marland Kitchen alum & mag hydroxide-simeth (MAALOX/MYLANTA) 200-200-20 MG/5ML suspension Take 30 mLs by mouth every 6 (six) hours as needed for indigestion or heartburn.   Marland Kitchen aspirin 81 MG tablet Take 81 mg by mouth at bedtime.    Marland Kitchen  atorvastatin (LIPITOR) 10 MG tablet Take 10 mg by mouth every evening.    . calcium carbonate (TUMS - DOSED IN MG ELEMENTAL CALCIUM) 500 MG chewable tablet Chew 1 tablet by mouth daily as needed for indigestion or heartburn.   . escitalopram (LEXAPRO) 20 MG tablet Take 1 tablet (20 mg total) by mouth daily.   . fentaNYL (DURAGESIC - DOSED MCG/HR) 75 MCG/HR Place 1 patch (75 mcg total) onto the skin every 3 (three) days.   . furosemide (LASIX) 20 MG tablet    . ibuprofen (ADVIL,MOTRIN) 200 MG tablet Take 200 mg by mouth 2 (two) times daily as needed for moderate pain.    Marland Kitchen lamoTRIgine (LAMICTAL) 100 MG tablet Taking 0.5 Tablets in AM and 1 Tablet in PM   . latanoprost (XALATAN) 0.005 % ophthalmic solution Place 1 drop into both eyes at bedtime. 03/20/2017: Preservative free  . levothyroxine (SYNTHROID, LEVOTHROID) 88 MCG tablet Take 88 mcg by mouth daily before breakfast.    . lidocaine-prilocaine (EMLA) cream Apply 1 application topically daily as needed (prior to port being accessed/flushed).    . lisinopril (PRINIVIL,ZESTRIL) 10 MG tablet Take 10 mg by mouth every morning.    . loratadine (CLARITIN) 10 MG tablet Take 10 mg by mouth daily as needed  for allergies.   . magnesium oxide (MAG-OX) 400 MG tablet Take 400 mg by mouth 2 (two) times daily.   . metoprolol succinate (TOPROL-XL) 25 MG 24 hr tablet TAKE 1/2 TABLET BY MOUTH EVERY MORNING - must schedule office visit   . polyethylene glycol (MIRALAX / GLYCOLAX) packet Take 17 g by mouth daily as needed for mild constipation.    . potassium chloride (K-DUR,KLOR-CON) 10 MEQ tablet     No facility-administered encounter medications on file as of 07/29/2017.     ALLERGIES:  Allergies  Allergen Reactions  . Morphine And Related Other (See Comments)    Hypotension  . Other     Preservative in latanoprost causes redness, pt uses preservative free latanoprost   . Paxil [Paroxetine Hcl]     unknown  . Latex Rash     PHYSICAL EXAM:  ECOG Performance status: 2 - Symptomatic; requires occasional assistance.  Vitals:   07/29/17 1144  BP: (!) 155/48  Pulse: 68  Resp: 20  Temp: 98.9 F (37.2 C)  SpO2: 96%   Filed Weights   07/29/17 1144  Weight: 184 lb 11.2 oz (83.8 kg)    Physical Exam  Constitutional: She is oriented to person, place, and time and well-developed, well-nourished, and in no distress.  HENT:  Head: Normocephalic.  Mouth/Throat: Oropharynx is clear and moist. No oropharyngeal exudate.  Eyes: Pupils are equal, round, and reactive to light. Conjunctivae are normal. No scleral icterus.  Neck: Normal range of motion. Neck supple.  Cardiovascular: Normal rate and regular rhythm.   Pulmonary/Chest: Effort normal and breath sounds normal. No respiratory distress. She has no wheezes.  Abdominal: Soft. Bowel sounds are normal. She exhibits no distension. There is no tenderness. There is no rebound and no guarding.  Musculoskeletal: Normal range of motion.  1+ BLE/ankle pitting edema (left worse than right)   Lymphadenopathy:    She has no cervical adenopathy.       Right: No inguinal and no supraclavicular adenopathy present.       Left: No inguinal and no  supraclavicular adenopathy present.  Neurological: She is alert and oriented to person, place, and time.  -Ambulates with walker; steady gait  -Bilateral hand tremors (  chronic)   Skin: Skin is warm and dry. No rash noted.  Psychiatric: Mood, memory, affect and judgment normal.  Nursing note and vitals reviewed.    LABORATORY DATA:  I have reviewed the labs as listed.  CBC    Component Value Date/Time   WBC 4.5 03/24/2017 0800   RBC 4.13 03/24/2017 0800   HGB 12.0 03/24/2017 0800   HCT 37.8 03/24/2017 0800   PLT 191 03/24/2017 0800   MCV 91.5 03/24/2017 0800   MCH 29.1 03/24/2017 0800   MCHC 31.7 03/24/2017 0800   RDW 13.4 03/24/2017 0800   LYMPHSABS 0.8 01/29/2017 1140   MONOABS 0.4 01/29/2017 1140   EOSABS 0.1 01/29/2017 1140   BASOSABS 0.0 01/29/2017 1140   CMP Latest Ref Rng & Units 03/24/2017 01/29/2017 10/28/2016  Glucose 65 - 99 mg/dL 108(H) 99 107(H)  BUN 6 - 20 mg/dL 13 15 16   Creatinine 0.44 - 1.00 mg/dL 0.75 0.73 0.73  Sodium 135 - 145 mmol/L 138 137 138  Potassium 3.5 - 5.1 mmol/L 4.0 4.4 4.0  Chloride 101 - 111 mmol/L 96(L) 100(L) 102  CO2 22 - 32 mmol/L 33(H) 32 30  Calcium 8.9 - 10.3 mg/dL 9.0 8.7(L) 9.2  Total Protein 6.5 - 8.1 g/dL - 5.9(L) 6.4(L)  Total Bilirubin 0.3 - 1.2 mg/dL - 0.4 0.5  Alkaline Phos 38 - 126 U/L - 89 126  AST 15 - 41 U/L - 19 28  ALT 14 - 54 U/L - 22 30    PENDING LABS:    DIAGNOSTIC IMAGING:  *The following radiologic images and reports have been reviewed independently and agree with below findings.  Last CT abd/pelvis: 02/20/16      PATHOLOGY:     ASSESSMENT & PLAN:   Stage IIIC1 endometrial cancer :  -Diagnosed in 02/2015. Treated at [facility] in [place], [place] under the care of Dr. Tressie Stalker. Completed chemo with Carbo/Taxol x 3 cycles, followed by EBRT which completed on 07/31/15.  Went on to complete 3 additional cycles of chemo through 10/03/15. She also had vaginal cuff brachytherapy x 3 treatments  on 08/08/15, 08/14/15, & 08/21/15.  -Last CT abd/pelvis in 02/2016 without evidence of disease. Discussed that routine surveillance imaging is not recommended; we would order scans in the future for any new/concerning symptoms or physical exam findings.   -Will continue with serial CA-125 monitoring. We discussed the role that tumor markers, history, and physical exam play in her cancer surveillance.   -Clinically, no evidence of recurrence today.  -Return to cancer center in 6 months for follow-up with labs.  Will plan on seeing her at least every 6 months through 5 years, then can see her annually vs "graduation" from cancer center after 5 years.       Port-a-cath:  -She is now 2+ years out from her initial diagnosis and it has been nearly 2 years since she completed treatment.   -Discussed the option of having her port-a-cath removed, as she relies on family to drive her to her port flush appointments from [facility] and this is sometimes difficult. We discussed the general procedure for port-a-cath removal, which is an outpatient procedure. She understands that if her cancer recurs, then we can have port replaced if needed.  She agreed to proceed with port removal at this time.  She had her port placed in [facility] initially; I will refer her to Dr. Arnoldo Morale here in [place] to see if he can help remove port when he has  availability to save a patient a trip back to [place].  Patient and family agree with this plan.    Urge urinary incontinence:  -Denies dysuria or hematuria. Could be UTI. Attempted to collect UA today, but she "missed" while collecting specimen.  Urge incontinence could certainly be secondary to previous gynecologic surgery/radiation and associated pelvic floor weakness.  -Encouraged her to follow-up with her PCP in [place], as it is easier for her to get to their office than it is to come to [place] to the cancer center. Their office can collect UA if they feel it is clinically  appropriate at that time.    LE edema:  -Bilateral LE pitting edema, left ankle/leg worse than right. Mild pain with flexion/extension.  Encouraged her to follow-up with her PCP for continued evaluation and management.  I have very low clinical suspicion of DVT; Homan's sign negative today and no calf pain or erythema/warmth.    Anxiety/Chronic pain disorder:  -Managed by Dr. Harrington Challenger with psychiatry. Overall, she feels like her mood is improving, particularly in the past 3 months.  -Encouraged her to increase her physical exercise as tolerated as well to help boost mood as well.   -Dr. Harrington Challenger manages her chronic pain disorder as well.    Health maintenance:  -Overdue for mammogram. She agreed to have mammogram done, but would like to go to the [facility] in [place] for imaging. I provided her with a paper prescription/order for bilateral screening mammogram for breast cancer screening.   -Encouraged increased physical activity with walking and chair aerobics at her facility, as tolerated.  Her meals are prepared at her facility; encouraged her to drink plenty of fluids.        Dispo:  -Referral to Dr. Arnoldo Morale for consideration of port-a-cath removal.  -Paper orders for screening mammogram to be done at [facility] in [place].  -Return to cancer center in 6 months for continued follow-up with labs.    All questions were answered to patient's stated satisfaction. Encouraged patient to call with any new concerns or questions before her next visit to the cancer center and we can certain see her sooner, if needed.    Plan of care discussed with Dr. Talbert Cage, who agrees with the above aforementioned.    A total of 30 minutes was spent in face-to-face care of this patient, with greater than 50% of that time spent in counseling and care-coordination.     Orders placed this encounter:  Orders Placed This Encounter  Procedures  . CBC with Differential/Platelet  . Comprehensive metabolic panel  .  [place], NP [place] [phone]

## 2017-07-29 NOTE — Patient Instructions (Signed)
[place] at [facility] Discharge Instructions  RECOMMENDATIONS MADE BY THE CONSULTANT AND ANY TEST RESULTS WILL BE SENT TO YOUR REFERRING PHYSICIAN.  Port flush with lab work today as ordered.  Thank you for choosing [place] at [facility] to provide your oncology and hematology care.  To afford each patient quality time with our provider, please arrive at least 15 minutes before your scheduled appointment time.    If you have a lab appointment with the [place] please come in thru the  Main Entrance and check in at the main information desk  You need to re-schedule your appointment should you arrive 10 or more minutes late.  We strive to give you quality time with our providers, and arriving late affects you and other patients whose appointments are after yours.  Also, if you no show three or more times for appointments you may be dismissed from the clinic at the providers discretion.     Again, thank you for choosing [facility].  Our hope is that these requests will decrease the amount of time that you wait before being seen by our physicians.       _____________________________________________________________  Should you have questions after your visit to [facility], please contact our office at [phone] between the hours of 8:30 a.m. and 4:30 p.m.  Voicemails left after 4:30 p.m. will not be returned until the following business day.  For prescription refill requests, have your pharmacy contact our office.       Resources For Cancer Patients and their Caregivers ? American Cancer Society: Can assist with transportation, wigs, general needs, runs Look Good Feel Better.        810-646-2454 ? Cancer Care: Provides financial assistance, online support groups, medication/co-pay assistance.  1-800-813-HOPE 629-346-6459) ? [place] Assists [place] cancer patients and their  families through emotional , educational and financial support.  680 102 3944 ? [place] DSS Where to apply for food stamps, Medicaid and utility assistance. (330) 098-1556 ? RCATS: Transportation to medical appointments. 365-003-5606 ? Social Security Administration: May apply for disability if have a Stage IV cancer. 775-872-3279 814-196-9247 ? [employer], Disability and Transit Services: Assists with nutrition, care and transit needs. [place] Support Programs: @10RELATIVEDAYS @ > Cancer Support Group  2nd Tuesday of the month 1pm-2pm, Journey Room  > Creative Journey  3rd Tuesday of the month 1130am-1pm, Journey Room  > Look Good Feel Better  1st Wednesday of the month 10am-12 noon, Journey Room (Call [place] to register [phone])

## 2017-07-29 NOTE — Patient Instructions (Addendum)
[place] at [facility] Discharge Instructions  RECOMMENDATIONS MADE BY THE CONSULTANT AND ANY TEST RESULTS WILL BE SENT TO YOUR REFERRING PHYSICIAN.  You were seen today by Mike Craze, NP Be sure to take your order with you to the [facility] for your mammogram We will get you an appointment with Dr. Arnoldo Morale to have your port removed Follow up in 6 months with lab work See schedulers up front for appointments    Thank you for choosing [place] at [facility] to provide your oncology and hematology care.  To afford each patient quality time with our provider, please arrive at least 15 minutes before your scheduled appointment time.    If you have a lab appointment with the [place] please come in thru the  Main Entrance and check in at the main information desk  You need to re-schedule your appointment should you arrive 10 or more minutes late.  We strive to give you quality time with our providers, and arriving late affects you and other patients whose appointments are after yours.  Also, if you no show three or more times for appointments you may be dismissed from the clinic at the providers discretion.     Again, thank you for choosing [facility].  Our hope is that these requests will decrease the amount of time that you wait before being seen by our physicians.       _____________________________________________________________  Should you have questions after your visit to [facility], please contact our office at [phone] between the hours of 8:30 a.m. and 4:30 p.m.  Voicemails left after 4:30 p.m. will not be returned until the following business day.  For prescription refill requests, have your pharmacy contact our office.       Resources For Cancer Patients and their Caregivers ? American Cancer Society: Can assist with transportation, wigs, general needs, runs Look Good Feel Better.         786-167-6793 ? Cancer Care: Provides financial assistance, online support groups, medication/co-pay assistance.  1-800-813-HOPE (559)213-7044) ? [place] Assists [place] cancer patients and their families through emotional , educational and financial support.  (564)758-6943 ? [place] DSS Where to apply for food stamps, Medicaid and utility assistance. 5412063330 ? RCATS: Transportation to medical appointments. 681 722 3415 ? Social Security Administration: May apply for disability if have a Stage IV cancer. (403) 531-4003 (212) 355-3069 ? [employer], Disability and Transit Services: Assists with nutrition, care and transit needs. [place] Support Programs: @10RELATIVEDAYS @ > Cancer Support Group  2nd Tuesday of the month 1pm-2pm, Journey Room  > Creative Journey  3rd Tuesday of the month 1130am-1pm, Journey Room  > Look Good Feel Better  1st Wednesday of the month 10am-12 noon, Journey Room (Call [place] to register [phone])

## 2017-07-29 NOTE — Progress Notes (Signed)
[name] presented for Portacath access and flush. Proper placement of portacath confirmed by CXR. Portacath located right chest wall accessed with  H 20 needle. Good blood return present. Portacath flushed with 67ml NS and 500U/55ml Heparin and needle removed intact. Procedure without incident. Patient tolerated procedure well.  Stable on discharge home with walker and caregiver.

## 2017-07-30 LAB — CA 125: CANCER ANTIGEN (CA) 125: 11.7 U/mL (ref 0.0–38.1)

## 2017-08-03 ENCOUNTER — Other Ambulatory Visit: Payer: Self-pay | Admitting: Cardiology

## 2017-08-11 DIAGNOSIS — Z961 Presence of intraocular lens: Secondary | ICD-10-CM | POA: Diagnosis not present

## 2017-08-11 DIAGNOSIS — H401131 Primary open-angle glaucoma, bilateral, mild stage: Secondary | ICD-10-CM | POA: Diagnosis not present

## 2017-08-13 ENCOUNTER — Encounter (HOSPITAL_COMMUNITY): Payer: Self-pay | Admitting: Psychiatry

## 2017-08-13 ENCOUNTER — Ambulatory Visit (INDEPENDENT_AMBULATORY_CARE_PROVIDER_SITE_OTHER): Payer: Medicare Other | Admitting: Psychiatry

## 2017-08-13 DIAGNOSIS — F411 Generalized anxiety disorder: Secondary | ICD-10-CM

## 2017-08-13 NOTE — Progress Notes (Signed)
  Patient:  Yesenia Reynolds   DOB: 05-09-1941  MR Number: [id number]  Location: [facility]:  [address] [place],  [place], [address]  Start:   Thursday 08/13/2017 10:10 AM  End: Thursday 08/13/2017 11:00 AM     Provider/Observer:     Maurice Small, MSW, LCSW     Chief Complaint:                Chief Complaint  Patient presents with  . Anxiety    Reason For Service:     Yesenia Reynolds is a 76 year old female who presents with symptoms of anxiety that began after she had treatment for cancer in 2016. Prior to treatment, patient reports being very independent and outgoing. After treatment, she became fearful of staying alone, driving, and being away from home. She reports excessive worry and says she developed shakiness. She also reports nervousness and decreased so  cial involvement.  Interventions Strategy:  Supportive       Participation Level:   Active  Participation Quality:  Appropriate      Behavioral Observation:  Casual, Alert, less anxious, tearful at times  Current Psychosocial Factors: Patient recently moved in to a senior living community, Housemate has multiple health issues and  was admitted to rehab facility after  hospitalization,   Content of Session:   reviewed symptoms, praised and reinforced patient's continued activity and social involvement, discussed stressors, facilitated expression of thoughts and feelings, assisted patient identify, praised and reinforced patient's use of assertiveness skills, assisted patient identify ways to  improve assertive communication and set/maintain boundaries in the relationship with her housemate  Current Status:   decreased anxiety, decreased excessive worry, much improved mood, hands/arms continue to tremble  Suicidal/Homicidal:    No          Patient Progress:   Good. Patient reports continued involvement in activities and increased socialization since last session. She reports no periods of depression but  says she may have one day every 4 or 5 days in which she is very nervous. She reports her housemate has returned to their condo and has hired someone to help with household responsibilities 2 hours per day 5 days per week. Patient plans to try to return to their condo the end of October. She still expresses concern regarding housemate possibly having unrealistic expectations of patient when she does return to their home.   Target Goals:   1. Learn and implement relapse prevention strategies for managing possible future anxiety symptoms  Last Reviewed:   02/11/2017  Goals Addressed Today:    1  Plan                  Return again in   3-4 weeks.  Impression/Diagnosis:  Patient presents with symptoms of anxiety that began after she had treatment for cancer in 2016. She reports excessive worry, motor tension, and social withdrawal.  She worries about a variety of issues.      Diagnosis:  Axis I: Generalized anxiety disorder          Axis II: Deferred   Yesenia Chipley, LCSW 08/13/2017

## 2017-08-18 ENCOUNTER — Ambulatory Visit (INDEPENDENT_AMBULATORY_CARE_PROVIDER_SITE_OTHER): Payer: Medicare Other | Admitting: General Surgery

## 2017-08-18 ENCOUNTER — Encounter: Payer: Self-pay | Admitting: General Surgery

## 2017-08-18 VITALS — BP 171/82 | HR 81 | Temp 98.9°F | Resp 18 | Ht 62.0 in | Wt 187.0 lb

## 2017-08-18 DIAGNOSIS — C541 Malignant neoplasm of endometrium: Secondary | ICD-10-CM | POA: Diagnosis not present

## 2017-08-18 NOTE — Patient Instructions (Signed)
Implanted Port Removal Implanted port removal is a procedure to remove the port and catheter (port-a-cath) that is implanted under your skin. The port is a small disc under your skin that can be punctured with a needle. It is connected to a vein in your chest or neck by a small flexible tube (catheter). The port-a-cath is used for treatment through an IV tube and for taking blood samples. Your health care provider will remove the port-a-cath if:  You no longer need it for treatment.  It is not working properly.  The area around it gets infected.  Tell a health care provider about:  Any allergies you have.  All medicines you are taking, including vitamins, herbs, eye drops, creams, and over-the-counter medicines.  Any problems you or family members have had with anesthetic medicines.  Any blood disorders you have.  Any surgeries you have had.  Any medical conditions you have.  Whether you are pregnant or may be pregnant. What are the risks? Generally, this is a safe procedure. However, problems may occur, including:  Infection.  Bleeding.  Allergic reactions to anesthetic medicines.  Damage to nerves or blood vessels.  What happens before the procedure?  You will have: ? A physical exam. ? Blood tests. ? Imaging tests, including a chest X-ray.  Follow instructions from your health care provider about eating or drinking restrictions.  Ask your health care provider about: ? Changing or stopping your regular medicines. This is especially important if you are taking diabetes medicines or blood thinners. ? Taking medicines such as aspirin and ibuprofen. These medicines can thin your blood. Do not take these medicines before your procedure if your surgeon instructs you not to.  Ask your health care provider how your surgical site will be marked or identified.  You may be given antibiotic medicine to help prevent infection.  Plan to have someone take you home after the  procedure.  If you will be going home right after the procedure, plan to have someone stay with you for 24 hours. What happens during the procedure?  To reduce your risk of infection: ? Your health care team will wash or sanitize their hands. ? Your skin will be washed with soap.  You may be given one or more of the following: ? A medicine to help you relax (sedative). ? A medicine to numb the area (local anesthetic).  A small cut (incision) will be made at the site of your port-a-cath.  The port-a-cath and the catheter that has been inside your vein will gently be removed.  The incision will be closed with stitches (sutures), adhesive strips, or skin glue.  A bandage (dressing) will be placed over the incision. The procedure may vary among health care providers and hospitals. What happens after the procedure?  Your blood pressure, heart rate, breathing rate, and blood oxygen level will be monitored often until the medicines you were given have worn off.  Do not drive for 24 hours if you received a sedative. This information is not intended to replace advice given to you by your health care provider. Make sure you discuss any questions you have with your health care provider. Document Released: 11/05/2015 Document Revised: 05/01/2016 Document Reviewed: 08/29/2015 Elsevier Interactive Patient Education  2018 Elsevier Inc.  

## 2017-08-18 NOTE — H&P (Signed)
Yesenia Reynolds; [id number]; 11-16-1941   HPI  patient is a 76 year old white female with a history of endometrial carcinoma who was referred to my care for Port-A-Cath removal.  She has finished chemotherapy.  She is not using her Port-A-Cath.  She currently has no pain.  No fevers are noted. Past Medical History:  Diagnosis Date  . Agoraphobia with panic attacks 08/07/2016  . Anxiety   . Arthritis   . Atrial fibrillation, currently in sinus rhythm   . Cancer [facility])    Endometrial  . Depression   . Endometrial cancer, FIGO stage IIIC ([place]) 08/08/2015  . Essential hypertension, benign   . GERD (gastroesophageal reflux disease)   . Hypothyroidism   . Mixed hyperlipidemia   . Palpitations   . Spinal stenosis   . Spinal stenosis of lumbar region at multiple levels 08/07/2016  . Type 2 diabetes mellitus ([place])     Past Surgical History:  Procedure Laterality Date  . ABDOMINAL HYSTERECTOMY    . BREAST BIOPSY     benign  . CATARACT EXTRACTION W/PHACO Left 03/31/2017   Procedure: CATARACT EXTRACTION PHACO AND INTRAOCULAR LENS PLACEMENT (IOC);  Surgeon: Rutherford Guys, MD;  Location: [facility];  Service: Ophthalmology;  Laterality: Left;  CDE: 8.67  . CATARACT EXTRACTION W/PHACO Right 04/14/2017   Procedure: CATARACT EXTRACTION PHACO AND INTRAOCULAR LENS PLACEMENT RIGHT EYE;  Surgeon: Rutherford Guys, MD;  Location: [facility];  Service: Ophthalmology;  Laterality: Right;  CDE: 8.52  . LIPOMA EXCISION    . Multiple dental extractions    . TONSILLECTOMY AND ADENOIDECTOMY    . TOTAL KNEE ARTHROPLASTY  08/04/2012   Procedure: TOTAL KNEE ARTHROPLASTY;  Surgeon: Gearlean Alf, MD;  Location: [facility];  Service: Orthopedics;  Laterality: Left;    Family History  Problem Relation Age of Onset  . Stroke Mother   . Heart failure Mother   . Hypertension Father   . Coronary artery disease Father     Current Outpatient Prescriptions on File Prior to Visit  Medication Sig Dispense Refill  . acetaminophen  (TYLENOL 8 HOUR ARTHRITIS PAIN) 650 MG CR tablet Take 650 mg by mouth every 8 (eight) hours as needed for pain.    Marland Kitchen alum & mag hydroxide-simeth (MAALOX/MYLANTA) 200-200-20 MG/5ML suspension Take 30 mLs by mouth every 6 (six) hours as needed for indigestion or heartburn.    Marland Kitchen aspirin 81 MG tablet Take 81 mg by mouth at bedtime.     Marland Kitchen atorvastatin (LIPITOR) 10 MG tablet Take 10 mg by mouth every evening.     . calcium carbonate (TUMS - DOSED IN MG ELEMENTAL CALCIUM) 500 MG chewable tablet Chew 1 tablet by mouth daily as needed for indigestion or heartburn.    . escitalopram (LEXAPRO) 20 MG tablet Take 1 tablet (20 mg total) by mouth daily. 30 tablet 2  . fentaNYL (DURAGESIC - DOSED MCG/HR) 75 MCG/HR Place 1 patch (75 mcg total) onto the skin every 3 (three) days. 10 patch 0  . furosemide (LASIX) 20 MG tablet     . ibuprofen (ADVIL,MOTRIN) 200 MG tablet Take 200 mg by mouth 2 (two) times daily as needed for moderate pain.     Marland Kitchen lamoTRIgine (LAMICTAL) 100 MG tablet Taking 0.5 Tablets in AM and 1 Tablet in PM 60 tablet 2  . latanoprost (XALATAN) 0.005 % ophthalmic solution Place 1 drop into both eyes at bedtime.    Marland Kitchen levothyroxine (SYNTHROID, LEVOTHROID) 88 MCG tablet Take 88 mcg by mouth daily before breakfast.     .  lidocaine-prilocaine (EMLA) cream Apply 1 application topically daily as needed (prior to port being accessed/flushed).     . lisinopril (PRINIVIL,ZESTRIL) 10 MG tablet Take 10 mg by mouth every morning.     . loratadine (CLARITIN) 10 MG tablet Take 10 mg by mouth daily as needed for allergies.    . magnesium oxide (MAG-OX) 400 MG tablet Take 400 mg by mouth 2 (two) times daily.    . metoprolol succinate (TOPROL-XL) 25 MG 24 hr tablet TAKE 1/2 TABLET BY MOUTH EVERY MORNING 15 tablet 0  . polyethylene glycol (MIRALAX / GLYCOLAX) packet Take 17 g by mouth daily as needed for mild constipation.     . potassium chloride (K-DUR,KLOR-CON) 10 MEQ tablet      No current facility-administered  medications on file prior to visit.     Allergies  Allergen Reactions  . Morphine And Related Other (See Comments)    Hypotension  . Other     Preservative in latanoprost causes redness, pt uses preservative free latanoprost   . Paxil [Paroxetine Hcl]     unknown  . Latex Rash    History  Alcohol Use No    Comment: 07/10/2016 per pt no    History  Smoking Status  . Never Smoker  Smokeless Tobacco  . Never Used    Review of Systems  Constitutional: Positive for malaise/fatigue.  HENT: Negative.   Eyes: Negative.   Respiratory: Negative.   Cardiovascular: Negative.   Gastrointestinal: Negative.   Genitourinary: Negative.   Musculoskeletal: Positive for back pain and joint pain.  Skin: Negative.   Neurological: Negative.   Psychiatric/Behavioral: Negative.     Objective   Vitals:   08/18/17 1353  BP: (!) 171/82  Pulse: 81  Resp: 18  Temp: 98.9 F (37.2 C)    Physical Exam  Constitutional: She is oriented to person, place, and time and well-developed, well-nourished, and in no distress.  HENT:  Head: Normocephalic and atraumatic.  Cardiovascular: Normal rate, regular rhythm and normal heart sounds.  Exam reveals no friction rub.   No murmur heard. Pulmonary/Chest: Effort normal and breath sounds normal. No respiratory distress. She has no wheezes. She has no rales.  Port-A-Cath in place in right upper chest.  Neurological: She is alert and oriented to person, place, and time.  Skin: Skin is warm and dry.  Vitals reviewed.   Assessment    Endometrial carcinoma, finished with chemotherapy Plan    patient is scheduled for Port-A-Cath removal in the minor procedure room on 09/02/2017.  The risks and benefits of the procedure including bleeding, infection, and bruising were fully explained to the patient, who gave informed consent.

## 2017-08-18 NOTE — Progress Notes (Signed)
Yesenia Reynolds; [id number]; May 06, 1941   HPI  patient is a 76 year old white female with a history of endometrial carcinoma who was referred to my care for Port-A-Cath removal.  She has finished chemotherapy.  She is not using her Port-A-Cath.  She currently has no pain.  No fevers are noted. Past Medical History:  Diagnosis Date  . Agoraphobia with panic attacks 08/07/2016  . Anxiety   . Arthritis   . Atrial fibrillation, currently in sinus rhythm   . Cancer [facility])    Endometrial  . Depression   . Endometrial cancer, FIGO stage IIIC ([place]) 08/08/2015  . Essential hypertension, benign   . GERD (gastroesophageal reflux disease)   . Hypothyroidism   . Mixed hyperlipidemia   . Palpitations   . Spinal stenosis   . Spinal stenosis of lumbar region at multiple levels 08/07/2016  . Type 2 diabetes mellitus ([place])     Past Surgical History:  Procedure Laterality Date  . ABDOMINAL HYSTERECTOMY    . BREAST BIOPSY     benign  . CATARACT EXTRACTION W/PHACO Left 03/31/2017   Procedure: CATARACT EXTRACTION PHACO AND INTRAOCULAR LENS PLACEMENT (IOC);  Surgeon: Rutherford Guys, MD;  Location: [facility];  Service: Ophthalmology;  Laterality: Left;  CDE: 8.67  . CATARACT EXTRACTION W/PHACO Right 04/14/2017   Procedure: CATARACT EXTRACTION PHACO AND INTRAOCULAR LENS PLACEMENT RIGHT EYE;  Surgeon: Rutherford Guys, MD;  Location: [facility];  Service: Ophthalmology;  Laterality: Right;  CDE: 8.52  . LIPOMA EXCISION    . Multiple dental extractions    . TONSILLECTOMY AND ADENOIDECTOMY    . TOTAL KNEE ARTHROPLASTY  08/04/2012   Procedure: TOTAL KNEE ARTHROPLASTY;  Surgeon: Gearlean Alf, MD;  Location: [facility];  Service: Orthopedics;  Laterality: Left;    Family History  Problem Relation Age of Onset  . Stroke Mother   . Heart failure Mother   . Hypertension Father   . Coronary artery disease Father     Current Outpatient Prescriptions on File Prior to Visit  Medication Sig Dispense Refill  . acetaminophen  (TYLENOL 8 HOUR ARTHRITIS PAIN) 650 MG CR tablet Take 650 mg by mouth every 8 (eight) hours as needed for pain.    Marland Kitchen alum & mag hydroxide-simeth (MAALOX/MYLANTA) 200-200-20 MG/5ML suspension Take 30 mLs by mouth every 6 (six) hours as needed for indigestion or heartburn.    Marland Kitchen aspirin 81 MG tablet Take 81 mg by mouth at bedtime.     Marland Kitchen atorvastatin (LIPITOR) 10 MG tablet Take 10 mg by mouth every evening.     . calcium carbonate (TUMS - DOSED IN MG ELEMENTAL CALCIUM) 500 MG chewable tablet Chew 1 tablet by mouth daily as needed for indigestion or heartburn.    . escitalopram (LEXAPRO) 20 MG tablet Take 1 tablet (20 mg total) by mouth daily. 30 tablet 2  . fentaNYL (DURAGESIC - DOSED MCG/HR) 75 MCG/HR Place 1 patch (75 mcg total) onto the skin every 3 (three) days. 10 patch 0  . furosemide (LASIX) 20 MG tablet     . ibuprofen (ADVIL,MOTRIN) 200 MG tablet Take 200 mg by mouth 2 (two) times daily as needed for moderate pain.     Marland Kitchen lamoTRIgine (LAMICTAL) 100 MG tablet Taking 0.5 Tablets in AM and 1 Tablet in PM 60 tablet 2  . latanoprost (XALATAN) 0.005 % ophthalmic solution Place 1 drop into both eyes at bedtime.    Marland Kitchen levothyroxine (SYNTHROID, LEVOTHROID) 88 MCG tablet Take 88 mcg by mouth daily before breakfast.     .  lidocaine-prilocaine (EMLA) cream Apply 1 application topically daily as needed (prior to port being accessed/flushed).     . lisinopril (PRINIVIL,ZESTRIL) 10 MG tablet Take 10 mg by mouth every morning.     . loratadine (CLARITIN) 10 MG tablet Take 10 mg by mouth daily as needed for allergies.    . magnesium oxide (MAG-OX) 400 MG tablet Take 400 mg by mouth 2 (two) times daily.    . metoprolol succinate (TOPROL-XL) 25 MG 24 hr tablet TAKE 1/2 TABLET BY MOUTH EVERY MORNING 15 tablet 0  . polyethylene glycol (MIRALAX / GLYCOLAX) packet Take 17 g by mouth daily as needed for mild constipation.     . potassium chloride (K-DUR,KLOR-CON) 10 MEQ tablet      No current facility-administered  medications on file prior to visit.     Allergies  Allergen Reactions  . Morphine And Related Other (See Comments)    Hypotension  . Other     Preservative in latanoprost causes redness, pt uses preservative free latanoprost   . Paxil [Paroxetine Hcl]     unknown  . Latex Rash    History  Alcohol Use No    Comment: 07/10/2016 per pt no    History  Smoking Status  . Never Smoker  Smokeless Tobacco  . Never Used    Review of Systems  Constitutional: Positive for malaise/fatigue.  HENT: Negative.   Eyes: Negative.   Respiratory: Negative.   Cardiovascular: Negative.   Gastrointestinal: Negative.   Genitourinary: Negative.   Musculoskeletal: Positive for back pain and joint pain.  Skin: Negative.   Neurological: Negative.   Psychiatric/Behavioral: Negative.     Objective   Vitals:   08/18/17 1353  BP: (!) 171/82  Pulse: 81  Resp: 18  Temp: 98.9 F (37.2 C)    Physical Exam  Constitutional: She is oriented to person, place, and time and well-developed, well-nourished, and in no distress.  HENT:  Head: Normocephalic and atraumatic.  Cardiovascular: Normal rate, regular rhythm and normal heart sounds.  Exam reveals no friction rub.   No murmur heard. Pulmonary/Chest: Effort normal and breath sounds normal. No respiratory distress. She has no wheezes. She has no rales.  Port-A-Cath in place in right upper chest.  Neurological: She is alert and oriented to person, place, and time.  Skin: Skin is warm and dry.  Vitals reviewed.   Assessment    Endometrial carcinoma, finished with chemotherapy Plan    patient is scheduled for Port-A-Cath removal in the minor procedure room on 09/02/2017.  The risks and benefits of the procedure including bleeding, infection, and bruising were fully explained to the patient, who gave informed consent.

## 2017-08-25 ENCOUNTER — Other Ambulatory Visit (HOSPITAL_COMMUNITY): Payer: Self-pay | Admitting: Oncology

## 2017-08-25 ENCOUNTER — Telehealth (HOSPITAL_COMMUNITY): Payer: Self-pay | Admitting: *Deleted

## 2017-08-25 DIAGNOSIS — C541 Malignant neoplasm of endometrium: Secondary | ICD-10-CM

## 2017-08-25 MED ORDER — FENTANYL 75 MCG/HR TD PT72: 75.0000 ug | MEDICATED_PATCH | TRANSDERMAL | 0 refills | Status: DC

## 2017-09-02 ENCOUNTER — Encounter (HOSPITAL_COMMUNITY): Payer: Self-pay | Admitting: Anesthesiology

## 2017-09-02 ENCOUNTER — Encounter (HOSPITAL_COMMUNITY)
Admission: RE | Disposition: A | Discharge status: Home / Self Care | Payer: Self-pay | Source: Ambulatory Visit | Attending: General Surgery

## 2017-09-02 ENCOUNTER — Ambulatory Visit (HOSPITAL_COMMUNITY)
Admission: RE | Admit: 2017-09-02 | Discharge: 2017-09-02 | Disposition: A | Discharge status: Home / Self Care | Payer: Medicare Other | Source: Ambulatory Visit | Attending: General Surgery | Admitting: General Surgery

## 2017-09-02 DIAGNOSIS — E782 Mixed hyperlipidemia: Secondary | ICD-10-CM | POA: Insufficient documentation

## 2017-09-02 DIAGNOSIS — I10 Essential (primary) hypertension: Secondary | ICD-10-CM | POA: Diagnosis not present

## 2017-09-02 DIAGNOSIS — I4891 Unspecified atrial fibrillation: Secondary | ICD-10-CM | POA: Diagnosis not present

## 2017-09-02 DIAGNOSIS — K219 Gastro-esophageal reflux disease without esophagitis: Secondary | ICD-10-CM | POA: Diagnosis not present

## 2017-09-02 DIAGNOSIS — F419 Anxiety disorder, unspecified: Secondary | ICD-10-CM | POA: Insufficient documentation

## 2017-09-02 DIAGNOSIS — F329 Major depressive disorder, single episode, unspecified: Secondary | ICD-10-CM | POA: Diagnosis not present

## 2017-09-02 DIAGNOSIS — E119 Type 2 diabetes mellitus without complications: Secondary | ICD-10-CM | POA: Diagnosis not present

## 2017-09-02 DIAGNOSIS — Z7982 Long term (current) use of aspirin: Secondary | ICD-10-CM | POA: Insufficient documentation

## 2017-09-02 DIAGNOSIS — Z8589 Personal history of malignant neoplasm of other organs and systems: Secondary | ICD-10-CM | POA: Diagnosis not present

## 2017-09-02 DIAGNOSIS — C541 Malignant neoplasm of endometrium: Secondary | ICD-10-CM | POA: Diagnosis not present

## 2017-09-02 DIAGNOSIS — E039 Hypothyroidism, unspecified: Secondary | ICD-10-CM | POA: Insufficient documentation

## 2017-09-02 DIAGNOSIS — Z79899 Other long term (current) drug therapy: Secondary | ICD-10-CM | POA: Insufficient documentation

## 2017-09-02 DIAGNOSIS — Z452 Encounter for adjustment and management of vascular access device: Secondary | ICD-10-CM | POA: Diagnosis not present

## 2017-09-02 HISTORY — PX: PORT-A-CATH REMOVAL: SHX5289

## 2017-09-02 SURGERY — MINOR REMOVAL PORT-A-CATH
Anesthesia: LOCAL | Laterality: Right

## 2017-09-02 MED ORDER — LIDOCAINE HCL (PF) 1 % IJ SOLN
INTRAMUSCULAR | Status: DC | PRN
  Administered 2017-09-02: 5 mL

## 2017-09-02 MED ORDER — CHLORHEXIDINE GLUCONATE CLOTH 2 % EX PADS: 6.0000 | MEDICATED_PAD | Freq: Once | CUTANEOUS | Status: DC

## 2017-09-02 MED ORDER — LIDOCAINE HCL (PF) 1 % IJ SOLN
INTRAMUSCULAR | Status: AC
  Filled 2017-09-02: qty 30

## 2017-09-02 MED ORDER — CHLORHEXIDINE GLUCONATE CLOTH 2 % EX PADS
6.0000 | MEDICATED_PAD | Freq: Once | CUTANEOUS | Status: DC
Start: 2017-09-02 — End: 2017-09-04

## 2017-09-02 SURGICAL SUPPLY — 20 items
CHLORAPREP W/TINT 10.5 ML (MISCELLANEOUS) ×2 IMPLANT
CLOTH BEACON ORANGE TIMEOUT ST (SAFETY) ×2 IMPLANT
DECANTER SPIKE VIAL GLASS SM (MISCELLANEOUS) ×2 IMPLANT
DERMABOND ADVANCED (GAUZE/BANDAGES/DRESSINGS) ×1
DERMABOND ADVANCED .7 DNX12 (GAUZE/BANDAGES/DRESSINGS) ×1 IMPLANT
DRAPE PROXIMA HALF (DRAPES) ×2 IMPLANT
ELECT REM PT RETURN 9FT ADLT (ELECTROSURGICAL) ×2
ELECTRODE REM PT RTRN 9FT ADLT (ELECTROSURGICAL) ×1 IMPLANT
GLOVE BIOGEL PI IND STRL 7.0 (GLOVE) ×1 IMPLANT
GLOVE BIOGEL PI INDICATOR 7.0 (GLOVE) ×1
GLOVE SURG SS PI 7.5 STRL IVOR (GLOVE) ×2 IMPLANT
GOWN STRL REUS W/TWL LRG LVL3 (GOWN DISPOSABLE) ×2 IMPLANT
NEEDLE HYPO 25X1 1.5 SAFETY (NEEDLE) ×2 IMPLANT
PENCIL HANDSWITCHING (ELECTRODE) ×2 IMPLANT
SPONGE GAUZE 2X2 8PLY STRL LF (GAUZE/BANDAGES/DRESSINGS) ×2 IMPLANT
SUT VIC AB 3-0 SH 27 (SUTURE) ×1
SUT VIC AB 3-0 SH 27X BRD (SUTURE) ×1 IMPLANT
SUT VIC AB 4-0 PS2 27 (SUTURE) ×2 IMPLANT
SYR CONTROL 10ML LL (SYRINGE) ×2 IMPLANT
TOWEL OR 17X26 4PK STRL BLUE (TOWEL DISPOSABLE) ×2 IMPLANT

## 2017-09-02 NOTE — Discharge Instructions (Signed)
Implanted Port Removal, Care After °Refer to this sheet in the next few weeks. These instructions provide you with information about caring for yourself after your procedure. Your health care provider may also give you more specific instructions. Your treatment has been planned according to current medical practices, but problems sometimes occur. Call your health care provider if you have any problems or questions after your procedure. °What can I expect after the procedure? °After the procedure, it is common to have: °· Soreness or pain near your incision. °· Some swelling or bruising near your incision. ° °Follow these instructions at home: °Medicines °· Take over-the-counter and prescription medicines only as told by your health care provider. °· If you were prescribed an antibiotic medicine, take it as told by your health care provider. Do not stop taking the antibiotic even if you start to feel better. °Bathing °· Do not take baths, swim, or use a hot tub until your health care provider approves. Ask your health care provider if you can take showers. You may only be allowed to take sponge baths for bathing. °Incision care °· Follow instructions from your health care provider about how to take care of your incision. Make sure you: °? Wash your hands with soap and water before you change your bandage (dressing). If soap and water are not available, use hand sanitizer. °? Change your dressing as told by your health care provider. °? Keep your dressing dry. °? Leave stitches (sutures), skin glue, or adhesive strips in place. These skin closures may need to stay in place for 2 weeks or longer. If adhesive strip edges start to loosen and curl up, you may trim the loose edges. Do not remove adhesive strips completely unless your health care provider tells you to do that. °· Check your incision area every day for signs of infection. Check for: °? More redness, swelling, or pain. °? More fluid or  blood. °? Warmth. °? Pus or a bad smell. °Driving °· If you received a sedative, do not drive for 24 hours after the procedure. °· If you did not receive a sedative, ask your health care provider when it is safe to drive. °Activity °· Return to your normal activities as told by your health care provider. Ask your health care provider what activities are safe for you. °· Until your health care provider says it is safe: °? Do not lift anything that is heavier than 10 lb (4.5 kg). °? Do not do activities that involve lifting your arms over your head. °General instructions °· Do not use any tobacco products, such as cigarettes, chewing tobacco, and e-cigarettes. Tobacco can delay healing. If you need help quitting, ask your health care provider. °· Keep all follow-up visits as told by your health care provider. This is important. °Contact a health care provider if: °· You have more redness, swelling, or pain around your incision. °· You have more fluid or blood coming from your incision. °· Your incision feels warm to the touch. °· You have pus or a bad smell coming from your incision. °· You have a fever. °· You have pain that is not relieved by your pain medicine. °Get help right away if: °· You have chest pain. °· You have difficulty breathing. °This information is not intended to replace advice given to you by your health care provider. Make sure you discuss any questions you have with your health care provider. °Document Released: 11/05/2015 Document Revised: 05/01/2016 Document Reviewed: 08/29/2015 °Elsevier Interactive Patient   Education © 2018 Elsevier Inc. ° °

## 2017-09-02 NOTE — Op Note (Signed)
Patient:  Yesenia Reynolds  DOB:  [birth year]  MRN:  [id number]   Preop Diagnosis:  Endometrial carcinoma, finished with chemotherapy  Postop Diagnosis:  Same  Procedure:  Port-A-Cath removal  Surgeon:  Aviva Signs, M.D.  Anes:  Local  Indications:  Patient is a 76 year old white female who presents for Port-A-Cath removal. The risks and benefits of the procedure including bleeding and infection were fully explained to the patient, who gave informed consent.  Procedure note:  The patient was placed the supine position. The right upper chest was prepped and draped using the usual sterile technique with DuraPrep. Surgical site confirmation was performed. One percent Xylocaine was used for local anesthesia.  An incision was made through the previous surgical incision site. This was taken down to the port. The port was removed in total without difficulty. It was disposed of. The fibrinous tunnel was occluded using a 3-0 Vicryl suture. The subcutaneous layer was reapproximated using a 3-0 Vicryl interrupted suture. The skin was closed using a 4-0 Vicryl subcuticular suture. Dermabond was applied.  All tape and needle counts were correct at the end of the procedure. The patient was discharged from the minor procedure room in good and stable condition.  Complications:  None  EBL:  Minimal  Specimen:  None

## 2017-09-02 NOTE — Interval H&P Note (Signed)
History and Physical Interval Note:  09/02/2017 9:18 AM  Yesenia Reynolds  has presented today for surgery, with the diagnosis of endometrial cancer  The various methods of treatment have been discussed with the patient and family. After consideration of risks, benefits and other options for treatment, the patient has consented to  Procedure(s): MINOR REMOVAL PORT-A-CATH (Right) as a surgical intervention .  The patient's history has been reviewed, patient examined, no change in status, stable for surgery.  I have reviewed the patient's chart and labs.  Questions were answered to the patient's satisfaction.     Aviva Signs

## 2017-09-03 ENCOUNTER — Encounter (HOSPITAL_COMMUNITY): Payer: Self-pay | Admitting: General Surgery

## 2017-09-03 DIAGNOSIS — Z299 Encounter for prophylactic measures, unspecified: Secondary | ICD-10-CM | POA: Diagnosis not present

## 2017-09-03 DIAGNOSIS — M25572 Pain in left ankle and joints of left foot: Secondary | ICD-10-CM | POA: Diagnosis not present

## 2017-09-03 DIAGNOSIS — E7800 Pure hypercholesterolemia, unspecified: Secondary | ICD-10-CM | POA: Diagnosis not present

## 2017-09-03 DIAGNOSIS — C55 Malignant neoplasm of uterus, part unspecified: Secondary | ICD-10-CM | POA: Diagnosis not present

## 2017-09-03 DIAGNOSIS — I10 Essential (primary) hypertension: Secondary | ICD-10-CM | POA: Diagnosis not present

## 2017-09-03 DIAGNOSIS — E039 Hypothyroidism, unspecified: Secondary | ICD-10-CM | POA: Diagnosis not present

## 2017-09-03 DIAGNOSIS — M7732 Calcaneal spur, left foot: Secondary | ICD-10-CM | POA: Diagnosis not present

## 2017-09-03 DIAGNOSIS — M19072 Primary osteoarthritis, left ankle and foot: Secondary | ICD-10-CM | POA: Diagnosis not present

## 2017-09-03 DIAGNOSIS — R600 Localized edema: Secondary | ICD-10-CM | POA: Diagnosis not present

## 2017-09-03 DIAGNOSIS — E669 Obesity, unspecified: Secondary | ICD-10-CM | POA: Diagnosis not present

## 2017-09-10 ENCOUNTER — Encounter (HOSPITAL_COMMUNITY): Payer: Self-pay | Admitting: Psychiatry

## 2017-09-10 ENCOUNTER — Ambulatory Visit (INDEPENDENT_AMBULATORY_CARE_PROVIDER_SITE_OTHER): Payer: Medicare Other | Admitting: Psychiatry

## 2017-09-10 DIAGNOSIS — F411 Generalized anxiety disorder: Secondary | ICD-10-CM | POA: Diagnosis not present

## 2017-09-10 NOTE — Progress Notes (Signed)
  Patient:  Yesenia Reynolds   DOB: 1941-07-31  MR Number: [id number]  Location: [facility]:  [address] [place],  [place], [address]  Start:   Thursday 09/10/2017 10:07 AM   End: Thursday 09/10/2017 10:55 AM    Provider/Observer:     Maurice Small, MSW, LCSW     Chief Complaint:                Chief Complaint  Patient presents with  . Anxiety    Reason For Service:     Yesenia Reynolds is a 76 year old female who presents with symptoms of anxiety that began after she had treatment for cancer in 2016. Prior to treatment, patient reports being very independent and outgoing. After treatment, she became fearful of staying alone, driving, and being away from home. She reports excessive worry and says she developed shakiness. She also reports nervousness and decreased so  cial involvement.  Interventions Strategy:  Supportive       Participation Level:   Active  Participation Quality:  Appropriate      Behavioral Observation:  Casual, Alert, anxious, tearful at times  Current Psychosocial Factors: Patient recently moved in to a senior living community, Housemate has multiple health issues and  was admitted to rehab facility after  hospitalization,   Content of Session:   reviewed symptoms, Assisted patient practice controlled breathing technique to decrease anxiety, assisted patient identify triggers of increased anxiety and worry, discussed connection between thoughts/mood/and behavior, assisted patient with problem solving regarding talking with her housemate    Current Status:   increased anxiety, excessive worry, intense trembling of hands/arms   Suicidal/Homicidal:    No          Patient Progress:    Patient reports increased stress and anxiety. She is very distraught and tearful today. She reports increased worry about moving back to her home and expresses ambivalent feelings. She is tired of where she is living now in the facility but also fears going home as  she fears the situation may not work out. She has a lot of  "what if" statements and reports procrastinating regarding having a meeting with her housemate. They talk on the phone daily but have not discussed future plans. Patient also reports negative spiraling thoughts about other situations in her life.  Target Goals:   1. Learn and implement relapse prevention strategies for managing possible future anxiety symptoms  Last Reviewed:   02/11/2017  Goals Addressed Today:    1  Plan                  Return again in   3-4 weeks.  Impression/Diagnosis:  Patient presents with symptoms of anxiety that began after she had treatment for cancer in 2016. She reports excessive worry, motor tension, and social withdrawal.  She worries about a variety of issues.      Diagnosis:  Axis I: Generalized anxiety disorder          Axis II: Deferred   BYNUM,PEGGY, LCSW 09/10/2017

## 2017-09-18 ENCOUNTER — Other Ambulatory Visit (HOSPITAL_COMMUNITY): Payer: Self-pay | Admitting: Adult Health

## 2017-09-18 ENCOUNTER — Telehealth (HOSPITAL_COMMUNITY): Payer: Self-pay | Admitting: *Deleted

## 2017-09-18 ENCOUNTER — Encounter (HOSPITAL_COMMUNITY): Payer: Self-pay | Admitting: Adult Health

## 2017-09-18 DIAGNOSIS — C541 Malignant neoplasm of endometrium: Secondary | ICD-10-CM

## 2017-09-18 MED ORDER — FENTANYL 75 MCG/HR TD PT72: 75.0000 ug | MEDICATED_PATCH | TRANSDERMAL | 0 refills | Status: DC

## 2017-09-18 NOTE — Telephone Encounter (Signed)
Rx printed.   gwd 

## 2017-09-18 NOTE — Progress Notes (Signed)
Patient called cancer center requesting refill of Fentanyl patch.   [place] Controlled Substance Reporting System reviewed and refill is appropriate on or after 09/17/17; last Rx was written by Dr. Talbert Cage on 08/25/17, but NCCSRS does not show that it was filled. Paper prescription printed & post-dated; Rx left at cancer center front desk for patient to retrieve after showing photo ID per clinic policy.   NCCSRS reviewed:     Mike Craze, NP [place] [phone]

## 2017-09-22 ENCOUNTER — Encounter (HOSPITAL_COMMUNITY): Payer: Self-pay | Admitting: Psychiatry

## 2017-09-22 ENCOUNTER — Ambulatory Visit (INDEPENDENT_AMBULATORY_CARE_PROVIDER_SITE_OTHER): Payer: Medicare Other | Admitting: Psychiatry

## 2017-09-22 VITALS — BP 175/82 | HR 80 | Ht 62.0 in | Wt 185.0 lb

## 2017-09-22 DIAGNOSIS — F4010 Social phobia, unspecified: Secondary | ICD-10-CM

## 2017-09-22 DIAGNOSIS — M549 Dorsalgia, unspecified: Secondary | ICD-10-CM

## 2017-09-22 DIAGNOSIS — Z8589 Personal history of malignant neoplasm of other organs and systems: Secondary | ICD-10-CM | POA: Diagnosis not present

## 2017-09-22 DIAGNOSIS — F4001 Agoraphobia with panic disorder: Secondary | ICD-10-CM | POA: Diagnosis not present

## 2017-09-22 DIAGNOSIS — F411 Generalized anxiety disorder: Secondary | ICD-10-CM | POA: Diagnosis not present

## 2017-09-22 DIAGNOSIS — R450 Nervousness: Secondary | ICD-10-CM

## 2017-09-22 DIAGNOSIS — Z90710 Acquired absence of both cervix and uterus: Secondary | ICD-10-CM | POA: Diagnosis not present

## 2017-09-22 MED ORDER — LAMOTRIGINE 100 MG PO TABS: ORAL_TABLET | ORAL | 2 refills | Status: DC

## 2017-09-22 MED ORDER — ESCITALOPRAM OXALATE 20 MG PO TABS: 20.0000 mg | ORAL_TABLET | Freq: Two times a day (BID) | ORAL | 2 refills | Status: DC

## 2017-09-22 NOTE — Progress Notes (Signed)
Psychiatric Initial Adult Assessment   Patient Identification: Yesenia Reynolds MRN:  [id number] Date of Evaluation:  09/22/2017 Referral Source: Dr. Abran Duke, oncologist at [facility] health Chief Complaint:   Chief Complaint    Depression; Anxiety; Follow-up     Visit Diagnosis:    ICD-10-CM   1. Generalized anxiety disorder F41.1     History of Present Illness:  This patient is a 76 year old single white female who lives with a female housemate in [place]. 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  The patient was referred by her oncologist for further treatment and assessment of severe anxiety.  The patient states that she has had no prior psychiatric assessment or treatment. She began to have endometrial bleeding in April 2016. She was found to have endometrial cancer and underwent hysterectomy. Following that she had 2 bouts of chemotherapy followed by radiation and eventually intravaginal radiation. She states that she finished all these treatments in October of 2016. She states that she did very well throughout the treatments and stayed calm and did not have significant problems with nausea and vomiting.  In November 2016 she began to have significant problems with anxiety. She didn't want anyone coming around and visiting her she felt extremely anxious being around people are going out to restaurants or to church. She is been a very gregarious person all her life and her personality totally changed. She denied being depressed sad or crying. She was shaking all the time and couldn't stay alone. She still can't stay alone. She stopped driving her car. She gave up things she enjoyed like going to her nephew's baseball games. She also could not watch anything frightening like police shows on TV because she would become extremely anxious. She is eating fairly well but lost a good deal of weight during her treatments. She is sleeping well and denies  nightmares. She states that she had short-term memory loss shortly after treatment but this is gotten much better.  She was tried on Paxil but it was not helpful. Her doctor has her on Xanax 0.25 mg which does help but she takes it as needed. Her gynecologist put her on Lamictal and she is up to 150 mg a day. She thinks this is helped more than anything else because she is less anxious than she was and is slowly starting to drive a little bit and be around a few people at a time. Her hand still shake at times. She would like to get out and do all the things she used to do such as drive on her own go to baseball games enjoy her friends and church but she is beginning to realize that this is going to be a slow process. She denies suicidal ideation or auditory or visual hallucinations  The patient returns after 2 months. She states that she  has been more anxious. Her mind has moved back to their home. She herself cannot decide if she wants to move back or not. She's not happy at the rest home because a lot of the patients have Alzheimer's and they make no sense and she can't have a conversation with them. He's very shaky and anxious today and she states that it's because "I had to come to see a doctor." She has been somewhat more depressed and anxious lately in general. She is going to visit her roommate and see how comfortable she feels back at home and I think this  is a good idea. Since she is more depressed we will go ahead and increase the Celexa to twice a day.   Associated Signs/Symptoms: Depression Symptoms:  psychomotor agitation, difficulty concentrating, panic attacks,  Anxiety Symptoms:  Agoraphobia, Excessive Worry, Panic Symptoms, Social Anxiety,   Past Psychiatric History: none  Previous Psychotropic Medications: Yes   Substance Abuse History in the last 12 months:  No.  Consequences of Substance Abuse: NA  Past Medical History:  Past Medical History:  Diagnosis Date  .  Agoraphobia with panic attacks 08/07/2016  . Anxiety   . Arthritis   . Atrial fibrillation, currently in sinus rhythm   . Cancer [facility])    Endometrial  . Depression   . Endometrial cancer, FIGO stage IIIC ([place]) 08/08/2015  . Essential hypertension, benign   . GERD (gastroesophageal reflux disease)   . Hypothyroidism   . Mixed hyperlipidemia   . Palpitations   . Spinal stenosis   . Spinal stenosis of lumbar region at multiple levels 08/07/2016  . Type 2 diabetes mellitus ([place])     Past Surgical History:  Procedure Laterality Date  . ABDOMINAL HYSTERECTOMY    . BREAST BIOPSY     benign  . CATARACT EXTRACTION W/PHACO Left 03/31/2017   Procedure: CATARACT EXTRACTION PHACO AND INTRAOCULAR LENS PLACEMENT (IOC);  Surgeon: Rutherford Guys, MD;  Location: [facility];  Service: Ophthalmology;  Laterality: Left;  CDE: 8.67  . CATARACT EXTRACTION W/PHACO Right 04/14/2017   Procedure: CATARACT EXTRACTION PHACO AND INTRAOCULAR LENS PLACEMENT RIGHT EYE;  Surgeon: Rutherford Guys, MD;  Location: [facility];  Service: Ophthalmology;  Laterality: Right;  CDE: 8.52  . LIPOMA EXCISION    . Multiple dental extractions    . PORT-A-CATH REMOVAL Right 09/02/2017   Procedure: MINOR REMOVAL PORT-A-CATH;  Surgeon: Aviva Signs, MD;  Location: [facility];  Service: General;  Laterality: Right;  . TONSILLECTOMY AND ADENOIDECTOMY    . TOTAL KNEE ARTHROPLASTY  08/04/2012   Procedure: TOTAL KNEE ARTHROPLASTY;  Surgeon: Gearlean Alf, MD;  Location: [facility];  Service: Orthopedics;  Laterality: Left;    Family Psychiatric History: none  Family History:  Family History  Problem Relation Age of Onset  . Stroke Mother   . Heart failure Mother   . Hypertension Father   . Coronary artery disease Father     Social History:   Social History   Social History  . Marital status: Single    Spouse name: N/A  . Number of children: N/A  . Years of education: N/A   Social History Main Topics  . Smoking status: Never Smoker  .  Smokeless tobacco: Never Used  . Alcohol use No     Comment: 07/10/2016 per pt no  . Drug use: No     Comment: 8/3/2017per pt no   . Sexual activity: No   Other Topics Concern  . None   Social History Narrative  . None    Additional Social History: The patient grew up in [place] with both parents and one younger sister. She states that she had an excellent childhood with no history of trauma or abuse. 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 She is never married and has lived with the same friend for 40 years. As a high school student and a younger person she was very active in sports such as softball and basketball and still very much enjoys watching the sports  Allergies:  Allergies  Allergen Reactions  . Morphine And Related Other (See Comments)    Hypotension  . Other     Preservative in latanoprost causes redness, pt uses preservative free latanoprost   . Paxil [Paroxetine Hcl]     unknown  . Latex Rash    Metabolic Disorder Labs: No results found for: HGBA1C, MPG No results found for: PROLACTIN No results found for: CHOL, TRIG, HDL, CHOLHDL, VLDL, LDLCALC   Current Medications: Current Outpatient Prescriptions  Medication Sig Dispense Refill  . acetaminophen (TYLENOL 8 HOUR ARTHRITIS PAIN) 650 MG CR tablet Take 650 mg by mouth every 8 (eight) hours as needed for pain.    Marland Kitchen alum & mag hydroxide-simeth (MAALOX/MYLANTA) 200-200-20 MG/5ML suspension Take 30 mLs by mouth every 6 (six) hours as needed for indigestion or heartburn.    Marland Kitchen aspirin 81 MG tablet Take 81 mg by mouth at bedtime.     Marland Kitchen atorvastatin (LIPITOR) 10 MG tablet Take 10 mg by mouth every evening.     . calcium carbonate (TUMS - DOSED IN MG ELEMENTAL CALCIUM) 500 MG chewable tablet Chew 1 tablet by mouth daily as needed for indigestion or heartburn.    . escitalopram (LEXAPRO) 20 MG tablet Take 1 tablet (20 mg total) by mouth 2 (two) times daily.  60 tablet 2  . fentaNYL (DURAGESIC - DOSED MCG/HR) 75 MCG/HR Place 1 patch (75 mcg total) onto the skin every 3 (three) days. 10 patch 0  . ibuprofen (ADVIL,MOTRIN) 200 MG tablet Take 200 mg by mouth 2 (two) times daily as needed for moderate pain.     Marland Kitchen lamoTRIgine (LAMICTAL) 100 MG tablet Taking 0.5 Tablets in AM and 1 Tablet in PM 60 tablet 2  . latanoprost (XALATAN) 0.005 % ophthalmic solution Place 1 drop into both eyes at bedtime.    Marland Kitchen levothyroxine (SYNTHROID, LEVOTHROID) 88 MCG tablet Take 88 mcg by mouth daily before breakfast.     . lisinopril (PRINIVIL,ZESTRIL) 10 MG tablet Take 10 mg by mouth every morning.     . loratadine (CLARITIN) 10 MG tablet Take 10 mg by mouth daily as needed for allergies.    . magnesium oxide (MAG-OX) 400 MG tablet Take 400 mg by mouth 2 (two) times daily.    . polyethylene glycol (MIRALAX / GLYCOLAX) packet Take 17 g by mouth daily as needed for mild constipation.     . metoprolol succinate (TOPROL-XL) 25 MG 24 hr tablet TAKE 1/2 TABLET BY MOUTH EVERY MORNING (Patient not taking: Reported on 09/22/2017) 15 tablet 0   No current facility-administered medications for this visit.     Neurologic: Headache: No Seizure: No Paresthesias:No  Musculoskeletal: Strength & Muscle Tone: within normal limits Gait & Station: unsteady Patient leans: N/A  Psychiatric Specialty Exam: Review of Systems  Musculoskeletal: Positive for back pain.  Psychiatric/Behavioral: The patient is nervous/anxious.   All other systems reviewed and are negative.   Blood pressure (!) 175/82, pulse 80, height 5\' 2"  (1.575 m), weight 185 lb (83.9 kg).Body mass index is 33.84 kg/m.  General Appearance: Casual, Neat and Well Groomed walking with a walker    Eye Contact:  Good  Speech:  Clear and Coherent  Volume:  Normal  Mood:  Anxious   Affect: Congruent, very shaky   Thought Process:  Goal Directed  Orientation:  Full (Time, Place, and Person)  Thought Content:  Rumination   Suicidal Thoughts:  No  Homicidal Thoughts:  No  Memory:  Immediate;   Good Recent;  Good Remote;   Good  Judgement:  Fair  Insight:  Good  Psychomotor Activity:  Normal  Concentration:  Concentration: Good and Attention Span: Fair  Recall:  Good  Fund of Knowledge:Good  Language: Good  Akathisia:  No  Handed:  Right  AIMS (if indicated):    Assets:  Communication Skills Desire for Improvement Resilience Social Support Talents/Skills  ADL's:  Intact  Cognition: WNL  Sleep:  ok    Treatment Plan Summary: Medication management   The patient will continue Lamictal 50 mg in the morning and 100 mg at bedtime. She'll Continue Lexapro  And increase the dosage to 20 g twice a day because of depression and anxiety symptoms. Her gynecologist has her completely off clonazepam She'll return to see me in 6 weeks and we'll continue her counseling   [name], MD 10/16/201811:50 AM Patient ID: [name], female   DOB: 1941/09/20, 76 y.o.   MRN: [id number]

## 2017-09-23 ENCOUNTER — Ambulatory Visit (HOSPITAL_COMMUNITY): Payer: Self-pay | Admitting: Psychiatry

## 2017-09-28 ENCOUNTER — Ambulatory Visit (HOSPITAL_COMMUNITY): Payer: Self-pay | Admitting: Psychiatry

## 2017-10-01 ENCOUNTER — Encounter (HOSPITAL_COMMUNITY): Payer: Self-pay | Admitting: Psychiatry

## 2017-10-01 ENCOUNTER — Ambulatory Visit (INDEPENDENT_AMBULATORY_CARE_PROVIDER_SITE_OTHER): Payer: Medicare Other | Admitting: Psychiatry

## 2017-10-01 DIAGNOSIS — F411 Generalized anxiety disorder: Secondary | ICD-10-CM

## 2017-10-01 DIAGNOSIS — Z23 Encounter for immunization: Secondary | ICD-10-CM | POA: Diagnosis not present

## 2017-10-01 NOTE — Progress Notes (Addendum)
  Patient:  Yesenia Reynolds   DOB: 02-17-1941  MR Number: [id number]  Location: [facility]:  [address] [place],  [place], [address]  Start:   Thursday 10/01/2017 9:00 AM   End: Thursday 10/01/2017 10:01 AM     Provider/Observer:     Maurice Small, MSW, LCSW     Chief Complaint:                Chief Complaint  Patient presents with  . Anxiety    Reason For Service:     SAMONA CHIHUAHUA is a 75 year old female who presents with symptoms of anxiety that began after she had treatment for cancer in 2016. Prior to treatment, patient reports being very independent and outgoing. After treatment, she became fearful of staying alone, driving, and being away from home. She reports excessive worry and says she developed shakiness. She also reports nervousness and decreased so  cial involvement.  Interventions Strategy:  Supportive       Participation Level:   Active  Participation Quality:  Appropriate      Behavioral Observation:  Casual, Alert, anxious,  Current Psychosocial Factors: Patient resides in a senior living community, Conflict with housemate   Content of Session:   reviewed symptoms, facilitated expression of thoughts and feelings, assisted patient with problem solving  regarding talking with her housemate about moving back home, assisted patient identify and address negative thoughts about meeting with housemate, assisted patient identify and develop a list of items she wishes to discuss, identify ways to set clear expectations, discussed ways with patient to improve assertive communication with her housemate,  provided patient with handouts on assertiveness skills and assigned her to review, reviewed relaxation techniques   Current Status:   decreased anxiety, excessive worry, intense trembling of hands/arms   Suicidal/Homicidal:    No          Patient Progress:    Patient reports continued stress and anxiety also says she has experienced some really good  days. She still wants to move back to her home but expresses anxiety about potential outcome. She still has not yet met with her  housemate to discuss concerns and expectations should she move home. She expresses continued frustration about resided in her current placement. She is maintaining contact with friends and family and continues involvement in activity.. Target Goals:   1. Learn and implement relapse prevention strategies for managing possible future anxiety symptoms  Last Reviewed:   02/11/2017  Goals Addressed Today:    1  Plan                  Return again in   3-4 weeks.  Impression/Diagnosis:  Patient presents with symptoms of anxiety that began after she had treatment for cancer in 2016. She reports excessive worry, motor tension, and social withdrawal.  She worries about a variety of issues.      Diagnosis:  Axis I: Generalized anxiety disorder          Axis II: Deferred   Quinnley Colasurdo, LCSW 10/01/2017

## 2017-10-12 DIAGNOSIS — Z1231 Encounter for screening mammogram for malignant neoplasm of breast: Secondary | ICD-10-CM | POA: Diagnosis not present

## 2017-10-16 DIAGNOSIS — M19072 Primary osteoarthritis, left ankle and foot: Secondary | ICD-10-CM | POA: Insufficient documentation

## 2017-10-21 ENCOUNTER — Encounter (HOSPITAL_COMMUNITY): Payer: Self-pay | Admitting: Adult Health

## 2017-10-21 ENCOUNTER — Other Ambulatory Visit (HOSPITAL_COMMUNITY): Payer: Self-pay | Admitting: Oncology

## 2017-10-21 ENCOUNTER — Telehealth (HOSPITAL_COMMUNITY): Payer: Self-pay | Admitting: *Deleted

## 2017-10-21 ENCOUNTER — Other Ambulatory Visit (HOSPITAL_COMMUNITY): Payer: Self-pay | Admitting: Adult Health

## 2017-10-21 DIAGNOSIS — C541 Malignant neoplasm of endometrium: Secondary | ICD-10-CM

## 2017-10-21 MED ORDER — FENTANYL 75 MCG/HR TD PT72: 75.0000 ug | MEDICATED_PATCH | TRANSDERMAL | 0 refills | Status: DC

## 2017-10-21 NOTE — Progress Notes (Signed)
Patient called cancer center requesting refill of Fentanyl patch.   [place] Controlled Substance Reporting System reviewed and refill is appropriate on or after 10/25/17. Paper prescription printed & post-dated; Rx left at cancer center front desk for patient to retrieve after showing photo ID per clinic policy.   NCCSRS reviewed:     Mike Craze, NP [place] [phone]

## 2017-10-22 ENCOUNTER — Encounter (HOSPITAL_COMMUNITY): Payer: Self-pay | Admitting: Psychiatry

## 2017-10-22 ENCOUNTER — Ambulatory Visit (INDEPENDENT_AMBULATORY_CARE_PROVIDER_SITE_OTHER): Payer: Medicare Other | Admitting: Psychiatry

## 2017-10-22 DIAGNOSIS — F411 Generalized anxiety disorder: Secondary | ICD-10-CM | POA: Diagnosis not present

## 2017-10-22 NOTE — Progress Notes (Signed)
Patient:  Yesenia Reynolds   DOB: [birth year]  MR Number: [id number]  Location: [facility]:  [address] [place],  [place], [address]  Start:   Thursday 10/22/2017 11:09 AM  End: Thursday 10/22/2017 12:04 AM     Provider/Observer:     Maurice Small, MSW, LCSW     Chief Complaint:               Anxiety, stress          Reason For Service:     FRANCETTA ILG is a 76 year old female who presents with symptoms of anxiety that began after she had treatment for cancer in 2016. Prior to treatment, patient reports being very independent and outgoing. After treatment, she became fearful of staying alone, driving, and being away from home. She reports excessive worry and says she developed shakiness. She also reports nervousness and decreased so  cial involvement.  Interventions Strategy:  Supportive/CBT       Participation Level:   Active  Participation Quality:  Appropriate      Behavioral Observation:  Casual, Alert, anxious,  Current Psychosocial Factors: Patient resides in a senior living community, Conflict with housemate   Content of Session:   reviewed symptoms, facilitated expression of thoughts and feelings, assisted patient identify triggers of increased anxiety, praise and reinforced patient's efforts to use assertiveness skills in communicating with her housemate, discussed effects on patient's mood and behavior, discussed ways patient can continue assertive communication with housemate to negotiate difficult issues, discussed possible transition plan for patient  moving back to her home, reviewedconnection between thoughts/mood/behaviors using examples from patient's life, reviewe relaxation techniques  and assigned patient to practice daily reviewed/revised treatment plan   Current Status:   Increased anxiety, excessive worry, intense trembling of hands/arms, feeling a little down some days  Suicidal/Homicidal:    No          Patient Progress:    Patient  reports increased nervousness, becoming more withdrawn, and increased drowsiness since taking increased dosage of Lexapro per her report. Upon father discussion, she also reports increased stress triggered by several factors including a new resident recently being placed in the nursing home facility. This resident has been assigned to patient's table and patient reports resident constantly cries and complains about wanting to go back home. Patient also reports 2 of her friends have continued to call her frequently but have not been able to visit her or take her out on trips as frequently as they have in the past due to their schedules. Patient reports she did have conversation with her roommate regarding plans and expectations about patient's move back home. She reports conversation went better than she thought it would. There are still some areas in which she and roommate have not come to an agreement. She still has not set a date to move home but is considering the possibility of having some overnight visits before moving home.    Target Goals:   1. Learn and implement coping strategies to reduce/manage anxiety.     2. Identify/challenge/and replace negative anxiety provoking self talk with realistic healthy alternatives ,    3, Identify relapse prevention strategies for managing possible future anxiety symptoms  Last Reviewed:   10/22/2017  Goals Addressed Today:    1,2,3  Plan                  Return again in   3-4 weeks.  Impression/Diagnosis:  Patient  presents with symptoms of anxiety that began after she had treatment for cancer in 2016. She reports excessive worry, motor tension, and social withdrawal.  She worries about a variety of issues.      Diagnosis:  Axis I: Generalized anxiety disorder          Axis II: Deferred   Kaizen Ibsen, LCSW 10/22/2017

## 2017-10-28 DIAGNOSIS — R928 Other abnormal and inconclusive findings on diagnostic imaging of breast: Secondary | ICD-10-CM | POA: Diagnosis not present

## 2017-10-28 DIAGNOSIS — R921 Mammographic calcification found on diagnostic imaging of breast: Secondary | ICD-10-CM | POA: Diagnosis not present

## 2017-11-04 ENCOUNTER — Ambulatory Visit (INDEPENDENT_AMBULATORY_CARE_PROVIDER_SITE_OTHER): Payer: Medicare Other | Admitting: Psychiatry

## 2017-11-04 ENCOUNTER — Encounter (HOSPITAL_COMMUNITY): Payer: Self-pay | Admitting: Psychiatry

## 2017-11-04 VITALS — BP 160/78 | HR 95 | Ht 62.0 in | Wt 186.0 lb

## 2017-11-04 DIAGNOSIS — R450 Nervousness: Secondary | ICD-10-CM

## 2017-11-04 DIAGNOSIS — F332 Major depressive disorder, recurrent severe without psychotic features: Secondary | ICD-10-CM | POA: Diagnosis not present

## 2017-11-04 DIAGNOSIS — F411 Generalized anxiety disorder: Secondary | ICD-10-CM | POA: Diagnosis not present

## 2017-11-04 MED ORDER — ESCITALOPRAM OXALATE 20 MG PO TABS: 20.0000 mg | ORAL_TABLET | Freq: Two times a day (BID) | ORAL | 2 refills | Status: DC

## 2017-11-04 MED ORDER — LAMOTRIGINE 100 MG PO TABS: ORAL_TABLET | ORAL | 2 refills | Status: DC

## 2017-11-04 MED ORDER — ESCITALOPRAM OXALATE 20 MG PO TABS: 20.0000 mg | ORAL_TABLET | Freq: Every day | ORAL | 2 refills | Status: DC

## 2017-11-04 NOTE — Progress Notes (Signed)
[facility] MD/PA/NP OP Progress Note  11/04/2017 11:24 AM Yesenia Reynolds  MRN:  [id number]  Chief Complaint:  Chief Complaint    Depression; Anxiety; Follow-up     BJS:EGBT patient is a 76 year old single white female who lives  In a rest home. 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  The patient was referred by her oncologist for further treatment and assessment of severe anxiety.  The patient states that she has had no prior psychiatric assessment or treatment. She began to have endometrial bleeding in April 2016. She was found to have endometrial cancer and underwent hysterectomy. Following that she had 2 bouts of chemotherapy followed by radiation and eventually intravaginal radiation. She states that she finished all these treatments in October of 2016. She states that she did very well throughout the treatments and stayed calm and did not have significant problems with nausea and vomiting.  In November 2016 she began to have significant problems with anxiety. She didn't want anyone coming around and visiting her she felt extremely anxious being around people are going out to restaurants or to church. She is been a very gregarious person all her life and her personality totally changed. She denied being depressed sad or crying. She was shaking all the time and couldn't stay alone. She still can't stay alone. She stopped driving her car. She gave up things she enjoyed like going to her nephew's baseball games. She also could not watch anything frightening like police shows on TV because she would become extremely anxious. She is eating fairly well but lost a good deal of weight during her treatments. She is sleeping well and denies nightmares. She states that she had short-term memory loss shortly after treatment but this is gotten much better.  She was tried on Paxil but it was not helpful. Her doctor has her on Xanax 0.25 mg which does help but she  takes it as needed. Her gynecologist put her on Lamictal and she is up to 150 mg a day. She thinks this is helped more than anything else because she is less anxious than she was and is slowly starting to drive a little bit and be around a few people at a time. Her hand still shake at times. She would like to get out and do all the things she used to do such as drive on her own go to baseball games enjoy her friends and church but she is beginning to realize that this is going to be a slow process. She denies suicidal ideation or auditory or visual hallucinations  She returns after 2 months.  She states that since we increase Lexapro to 40 mg daily she has been feeling more dizzy and unsteady and is difficult for her to walk.  She is slightly less depressed but not noticeably so.  She still has to make decisions about leaving the rest home and going back to living with her roommate and eating.  She thinks she is going to do this right after Christmas.  She still does not get out much but she does have friends visiting her.  She is sleeping well and eating well she still has a tremor in her hands and gets very anxious when she has to go out like she did today.  She denies suicidal ideation Visit Diagnosis:    ICD-10-CM   1. Generalized anxiety disorder F41.1     Past Psychiatric History: none  Past Medical History:  Past Medical History:  Diagnosis Date  . Agoraphobia with panic attacks 08/07/2016  . Anxiety   . Arthritis   . Atrial fibrillation, currently in sinus rhythm   . Cancer [facility])    Endometrial  . Depression   . Endometrial cancer, FIGO stage IIIC ([place]) 08/08/2015  . Essential hypertension, benign   . GERD (gastroesophageal reflux disease)   . Hypothyroidism   . Mixed hyperlipidemia   . Palpitations   . Spinal stenosis   . Spinal stenosis of lumbar region at multiple levels 08/07/2016  . Type 2 diabetes mellitus ([place])     Past Surgical History:  Procedure Laterality Date  .  ABDOMINAL HYSTERECTOMY    . BREAST BIOPSY     benign  . CATARACT EXTRACTION W/PHACO Left 03/31/2017   Procedure: CATARACT EXTRACTION PHACO AND INTRAOCULAR LENS PLACEMENT (IOC);  Surgeon: Rutherford Guys, MD;  Location: [facility];  Service: Ophthalmology;  Laterality: Left;  CDE: 8.67  . CATARACT EXTRACTION W/PHACO Right 04/14/2017   Procedure: CATARACT EXTRACTION PHACO AND INTRAOCULAR LENS PLACEMENT RIGHT EYE;  Surgeon: Rutherford Guys, MD;  Location: [facility];  Service: Ophthalmology;  Laterality: Right;  CDE: 8.52  . LIPOMA EXCISION    . Multiple dental extractions    . PORT-A-CATH REMOVAL Right 09/02/2017   Procedure: MINOR REMOVAL PORT-A-CATH;  Surgeon: Aviva Signs, MD;  Location: [facility];  Service: General;  Laterality: Right;  . TONSILLECTOMY AND ADENOIDECTOMY    . TOTAL KNEE ARTHROPLASTY  08/04/2012   Procedure: TOTAL KNEE ARTHROPLASTY;  Surgeon: Gearlean Alf, MD;  Location: [facility];  Service: Orthopedics;  Laterality: Left;    Family Psychiatric History: none Family History:  Family History  Problem Relation Age of Onset  . Stroke Mother   . Heart failure Mother   . Hypertension Father   . Coronary artery disease Father     Social History:  Social History   Socioeconomic History  . Marital status: Single    Spouse name: None  . Number of children: None  . Years of education: None  . Highest education level: None  Social Needs  . Financial resource strain: None  . Food insecurity - worry: None  . Food insecurity - inability: None  . Transportation needs - medical: None  . Transportation needs - non-medical: None  Occupational History  . None  Tobacco Use  . Smoking status: Never Smoker  . Smokeless tobacco: Never Used  Substance and Sexual Activity  . Alcohol use: No    Comment: 07/10/2016 per pt no  . Drug use: No    Comment: 8/3/2017per pt no   . Sexual activity: No  Other Topics Concern  . None  Social History Narrative  . None    Allergies:  Allergies   Allergen Reactions  . Morphine And Related Other (See Comments)    Hypotension  . Other     Preservative in latanoprost causes redness, pt uses preservative free latanoprost   . Paxil [Paroxetine Hcl]     unknown  . Latex Rash    Metabolic Disorder Labs: No results found for: HGBA1C, MPG No results found for: PROLACTIN No results found for: CHOL, TRIG, HDL, CHOLHDL, VLDL, LDLCALC No results found for: TSH  Therapeutic Level Labs: No results found for: LITHIUM No results found for: VALPROATE No components found for:  CBMZ  Current Medications: Current Outpatient Medications  Medication Sig Dispense Refill  . acetaminophen (TYLENOL 8 HOUR ARTHRITIS PAIN) 650 MG CR tablet Take  650 mg by mouth every 8 (eight) hours as needed for pain.    Marland Kitchen alum & mag hydroxide-simeth (MAALOX/MYLANTA) 200-200-20 MG/5ML suspension Take 30 mLs by mouth every 6 (six) hours as needed for indigestion or heartburn.    Marland Kitchen aspirin 81 MG tablet Take 81 mg by mouth at bedtime.     Marland Kitchen atorvastatin (LIPITOR) 10 MG tablet Take 10 mg by mouth every evening.     . calcium carbonate (TUMS - DOSED IN MG ELEMENTAL CALCIUM) 500 MG chewable tablet Chew 1 tablet by mouth daily as needed for indigestion or heartburn.    . escitalopram (LEXAPRO) 20 MG tablet Take 1 tablet (20 mg total) by mouth daily. 30 tablet 2  . fentaNYL (DURAGESIC - DOSED MCG/HR) 75 MCG/HR Place 1 patch (75 mcg total) every 3 (three) days onto the skin. 10 patch 0  . ibuprofen (ADVIL,MOTRIN) 200 MG tablet Take 200 mg by mouth 2 (two) times daily as needed for moderate pain.     Marland Kitchen lamoTRIgine (LAMICTAL) 100 MG tablet Taking 0.5 Tablets in AM and 1 Tablet in PM 60 tablet 2  . latanoprost (XALATAN) 0.005 % ophthalmic solution Place 1 drop into both eyes at bedtime.    Marland Kitchen levothyroxine (SYNTHROID, LEVOTHROID) 88 MCG tablet Take 88 mcg by mouth daily before breakfast.     . lisinopril (PRINIVIL,ZESTRIL) 10 MG tablet Take 10 mg by mouth every morning.     .  loratadine (CLARITIN) 10 MG tablet Take 10 mg by mouth daily as needed for allergies.    . magnesium oxide (MAG-OX) 400 MG tablet Take 400 mg by mouth 2 (two) times daily.    . metoprolol succinate (TOPROL-XL) 25 MG 24 hr tablet TAKE 1/2 TABLET BY MOUTH EVERY MORNING 15 tablet 0  . polyethylene glycol (MIRALAX / GLYCOLAX) packet Take 17 g by mouth daily as needed for mild constipation.      No current facility-administered medications for this visit.      Musculoskeletal: Strength & Muscle Tone: decreased Gait & Station: unsteady Patient leans: N/A  Psychiatric Specialty Exam: Review of Systems  Constitutional: Positive for malaise/fatigue.  Musculoskeletal: Positive for back pain and joint pain.  Psychiatric/Behavioral: Positive for depression. The patient is nervous/anxious.   All other systems reviewed and are negative.   Blood pressure (!) 160/78, pulse 95, height 5\' 2"  (1.575 m), weight 186 lb (84.4 kg), SpO2 97 %.Body mass index is 34.02 kg/m.  General Appearance: Casual and Fairly Groomed walking with the aid of a walker  Eye Contact:  Good  Speech:  Clear and Coherent  Volume:  Normal  Mood:  Anxious  Affect:  Constricted  Thought Process:  Goal Directed  Orientation:  Full (Time, Place, and Person)  Thought Content: Rumination   Suicidal Thoughts:  No  Homicidal Thoughts:  No  Memory:  Immediate;   Good Recent;   Good Remote;   Fair  Judgement:  Fair  Insight:  Fair  Psychomotor Activity:  Tremor  Concentration:  Concentration: Poor and Attention Span: Poor  Recall:  Good  Fund of Knowledge: Good  Language: Good  Akathisia:  No  Handed:  Right  AIMS (if indicated): not done  Assets:  Communication Skills Desire for Improvement Resilience Social Support Talents/Skills  ADL's:  Intact  Cognition: WNL  Sleep:  Good   Screenings: GAD-7     Counselor from 09/15/2016 in [facility]  Total GAD-7 Score  10  Assessment and Plan: Patient is a 76 year old female with a history of anxiety and depression that started months after her cancer treatment.  She is now living in a rest home and having mixed feelings about being there.  Her inability to make a decision causes even more anxiety.  We discussed all of this at length today.  She thinks she will feel better once she gets back to her own home.  She is hoping to be back in the next month.  Since the increase Lexapro was causing more unsteadiness we will decrease it back to 20 mg daily and continue Lamictal 50 mg in the morning and 100 mg at bedtime for mood stabilization.  She will return to see me in 6 weeks   [name], MD 11/04/2017, 11:24 AM

## 2017-11-04 NOTE — Patient Instructions (Signed)
Lexapro 20 mg decreased to once a day

## 2017-11-10 DIAGNOSIS — H401131 Primary open-angle glaucoma, bilateral, mild stage: Secondary | ICD-10-CM | POA: Diagnosis not present

## 2017-11-12 ENCOUNTER — Encounter (HOSPITAL_COMMUNITY): Payer: Self-pay | Admitting: Psychiatry

## 2017-11-12 ENCOUNTER — Ambulatory Visit (INDEPENDENT_AMBULATORY_CARE_PROVIDER_SITE_OTHER): Payer: Medicare Other | Admitting: Psychiatry

## 2017-11-12 DIAGNOSIS — F411 Generalized anxiety disorder: Secondary | ICD-10-CM | POA: Diagnosis not present

## 2017-11-12 NOTE — Progress Notes (Signed)
  Patient:  Yesenia Reynolds   DOB: 1940-12-10  MR Number: [id number]  Location: [facility]:  [address] [place],  [place], [address]  Start:   Thursday 11/12/2017 3:05 PM  End: Thursday 11/12/2017 3:55 PM    Provider/Observer:     Maurice Small, MSW, LCSW     Chief Complaint:               Anxiety, stress          Reason For Service:     AMRIE GURGANUS is a 76 year old female who presents with symptoms of anxiety that began after she had treatment for cancer in 2016. Prior to treatment, patient reports being very independent and outgoing. After treatment, she became fearful of staying alone, driving, and being away from home. She reports excessive worry and says she developed shakiness. She also reports nervousness and decreased so  cial involvement.  Interventions Strategy:  Supportive/CBT       Participation Level:   Active  Participation Quality:  Appropriate      Behavioral Observation:  Casual, Alert, anxious,  Current Psychosocial Factors: Patient resides in a senior living community, Conflict with housemate   Content of Session:   reviewed symptoms, facilitated expression of thoughts and feelings, assisted patient identify triggers of anxiety, facilitated expression of thoughts and feelings about moving back to her home, assisted patient identify/challenge/and replace negative thought patterns with more realistic healthy alternatives, assisted patient identify ways to have assertive communication with housemate about her concerns regarding moving back home.   Current Status:   Increased anxiety, excessive worry, intense trembling of hands/arms, feeling a little down some days  Suicidal/Homicidal:    No          Patient Progress:    Patient reports increased social involvement but continued nervousness and worry. She is tired of staying in current housing situation but is ambivalent about moving back home. She fears roommate will be upset because patient is  not able to do things at home like she used to. She has visited roommate twice for the afternoon since last session and reports visits went well. She reports having positive interaction and being comfortable at her home.   Target Goals:   1. Learn and implement coping strategies to reduce/manage anxiety.     2. Identify/challenge/and replace negative anxiety provoking self talk with realistic healthy alternatives ,    3, Identify relapse prevention strategies for managing possible future anxiety symptoms  Last Reviewed:   10/22/2017  Goals Addressed Today:    1,2,3  Plan                  Return again in   3-4 weeks.  Impression/Diagnosis:  Patient presents with symptoms of anxiety that began after she had treatment for cancer in 2016. She reports excessive worry, motor tension, and social withdrawal.  She worries about a variety of issues.      Diagnosis:  Axis I: Generalized anxiety disorder          Axis II: Deferred   BYNUM,PEGGY, LCSW 11/12/2017

## 2017-11-23 ENCOUNTER — Other Ambulatory Visit (HOSPITAL_COMMUNITY): Payer: Self-pay | Admitting: Oncology

## 2017-11-23 DIAGNOSIS — C541 Malignant neoplasm of endometrium: Secondary | ICD-10-CM

## 2017-11-23 MED ORDER — FENTANYL 75 MCG/HR TD PT72: 75.0000 ug | MEDICATED_PATCH | TRANSDERMAL | 0 refills | Status: DC

## 2017-12-07 ENCOUNTER — Telehealth (HOSPITAL_COMMUNITY): Payer: Self-pay | Admitting: *Deleted

## 2017-12-07 NOTE — Telephone Encounter (Signed)
left voice message, provider out of office 12/10/16.  please call to reschedule.

## 2017-12-10 ENCOUNTER — Ambulatory Visit (HOSPITAL_COMMUNITY): Payer: Medicare Other | Admitting: Psychiatry

## 2017-12-16 ENCOUNTER — Ambulatory Visit (INDEPENDENT_AMBULATORY_CARE_PROVIDER_SITE_OTHER): Payer: Medicare Other | Admitting: Psychiatry

## 2017-12-16 ENCOUNTER — Encounter (HOSPITAL_COMMUNITY): Payer: Self-pay | Admitting: Psychiatry

## 2017-12-16 VITALS — BP 188/76 | HR 79 | Ht 62.0 in | Wt 190.0 lb

## 2017-12-16 DIAGNOSIS — R251 Tremor, unspecified: Secondary | ICD-10-CM

## 2017-12-16 DIAGNOSIS — R450 Nervousness: Secondary | ICD-10-CM | POA: Diagnosis not present

## 2017-12-16 DIAGNOSIS — Z818 Family history of other mental and behavioral disorders: Secondary | ICD-10-CM

## 2017-12-16 DIAGNOSIS — F411 Generalized anxiety disorder: Secondary | ICD-10-CM

## 2017-12-16 DIAGNOSIS — M549 Dorsalgia, unspecified: Secondary | ICD-10-CM | POA: Diagnosis not present

## 2017-12-16 MED ORDER — ALPRAZOLAM 0.25 MG PO TABS: 0.2500 mg | ORAL_TABLET | Freq: Every day | ORAL | 2 refills | Status: DC | PRN

## 2017-12-16 MED ORDER — LAMOTRIGINE 100 MG PO TABS: 100.0000 mg | ORAL_TABLET | Freq: Every day | ORAL | 2 refills | Status: DC

## 2017-12-16 MED ORDER — ESCITALOPRAM OXALATE 20 MG PO TABS: 20.0000 mg | ORAL_TABLET | Freq: Every day | ORAL | 2 refills | Status: DC

## 2017-12-16 NOTE — Progress Notes (Signed)
[place] MD/PA/NP OP Progress Note  12/16/2017 11:20 AM Yesenia Reynolds  MRN:  [id number]  Chief Complaint:  Chief Complaint    Depression; Anxiety; Follow-up     HPI: This patient is a 77 year old single white female who lives  In a rest home. 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  The patient was referred by her oncologist for further treatment and assessment of severe anxiety.  The patient states that she has had no prior psychiatric assessment or treatment. She began to have endometrial bleeding in April 2016. She was found to have endometrial cancer and underwent hysterectomy. Following that she had 2 bouts of chemotherapy followed by radiation and eventually intravaginal radiation. She states that she finished all these treatments in October of 2016. She states that she did very well throughout the treatments and stayed calm and did not have significant problems with nausea and vomiting.  In November 2016 she began to have significant problems with anxiety. She didn't want anyone coming around and visiting her she felt extremely anxious being around people are going out to restaurants or to church. She is been a very gregarious person all her life and her personality totally changed. She denied being depressed sad or crying. She was shaking all the time and couldn't stay alone. She still can't stay alone. She stopped driving her car. She gave up things she enjoyed like going to her nephew's baseball games. She also could not watch anything frightening like police shows on TV because she would become extremely anxious. She is eating fairly well but lost a good deal of weight during her treatments. She is sleeping well and denies nightmares. She states that she had short-term memory loss shortly after treatment but this is gotten much better.  She was tried on Paxil but it was not helpful. Her doctor has her on Xanax 0.25 mg which does help but she  takes it as needed. Her gynecologist put her on Lamictal and she is up to 150 mg a day. She thinks this is helped more than anything else because she is less anxious than she was and is slowly starting to drive a little bit and be around a few people at a time. Her hand still shake at times. She would like to get out and do all the things she used to do such as drive on her own go to baseball games enjoy her friends and church but she is beginning to realize that this is going to be a slow process. She denies suicidal ideation or auditory or visual hallucinations  Returns after 2 months.  I spoke to both her and her sister today.  She is having a lot of tremor in her hands.  She states that she is very anxious and the anxiety gets better as the day goes on.  She does not like where she is living because so many of the people have Alzheimer's and she cannot talk to them.  She is thinking about going back to live with her roommate for 2 weeks and see how it goes.  She sort of dragging this to because they do not really get along that well and neither of them drive or cook.  He feels stuck because she does not know where else to go.  Her sisters convinced that the Lamictal has caused a tremor.  I am not so sure but I am willing to start tapering it  down to see if the tremor gets better.  I will also give her a little bit of Xanax to take only as needed because it causes drowsiness for her. Visit Diagnosis:    ICD-10-CM   1. Generalized anxiety disorder F41.1     Past Psychiatric History: None until she went through cancer treatment  Past Medical History:  Past Medical History:  Diagnosis Date  . Agoraphobia with panic attacks 08/07/2016  . Anxiety   . Arthritis   . Atrial fibrillation, currently in sinus rhythm   . Cancer [facility])    Endometrial  . Depression   . Endometrial cancer, FIGO stage IIIC ([place]) 08/08/2015  . Essential hypertension, benign   . GERD (gastroesophageal reflux disease)   .  Hypothyroidism   . Mixed hyperlipidemia   . Palpitations   . Spinal stenosis   . Spinal stenosis of lumbar region at multiple levels 08/07/2016  . Type 2 diabetes mellitus ([place])     Past Surgical History:  Procedure Laterality Date  . ABDOMINAL HYSTERECTOMY    . BREAST BIOPSY     benign  . CATARACT EXTRACTION W/PHACO Left 03/31/2017   Procedure: CATARACT EXTRACTION PHACO AND INTRAOCULAR LENS PLACEMENT (IOC);  Surgeon: Rutherford Guys, MD;  Location: [facility];  Service: Ophthalmology;  Laterality: Left;  CDE: 8.67  . CATARACT EXTRACTION W/PHACO Right 04/14/2017   Procedure: CATARACT EXTRACTION PHACO AND INTRAOCULAR LENS PLACEMENT RIGHT EYE;  Surgeon: Rutherford Guys, MD;  Location: [facility];  Service: Ophthalmology;  Laterality: Right;  CDE: 8.52  . LIPOMA EXCISION    . Multiple dental extractions    . PORT-A-CATH REMOVAL Right 09/02/2017   Procedure: MINOR REMOVAL PORT-A-CATH;  Surgeon: Aviva Signs, MD;  Location: [facility];  Service: General;  Laterality: Right;  . TONSILLECTOMY AND ADENOIDECTOMY    . TOTAL KNEE ARTHROPLASTY  08/04/2012   Procedure: TOTAL KNEE ARTHROPLASTY;  Surgeon: Gearlean Alf, MD;  Location: [facility];  Service: Orthopedics;  Laterality: Left;    Family Psychiatric History: Sister reports she went through severe postpartum depression at one time  Family History:  Family History  Problem Relation Age of Onset  . Stroke Mother   . Heart failure Mother   . Hypertension Father   . Coronary artery disease Father     Social History:  Social History   Socioeconomic History  . Marital status: Single    Spouse name: None  . Number of children: None  . Years of education: None  . Highest education level: None  Social Needs  . Financial resource strain: None  . Food insecurity - worry: None  . Food insecurity - inability: None  . Transportation needs - medical: None  . Transportation needs - non-medical: None  Occupational History  . None  Tobacco Use  . Smoking  status: Never Smoker  . Smokeless tobacco: Never Used  Substance and Sexual Activity  . Alcohol use: No    Comment: 07/10/2016 per pt no  . Drug use: No    Comment: 8/3/2017per pt no   . Sexual activity: No  Other Topics Concern  . None  Social History Narrative  . None    Allergies:  Allergies  Allergen Reactions  . Morphine And Related Other (See Comments)    Hypotension  . Other     Preservative in latanoprost causes redness, pt uses preservative free latanoprost   . Paxil [Paroxetine Hcl]     unknown  . Latex Rash    Metabolic Disorder Labs: No  results found for: HGBA1C, MPG No results found for: PROLACTIN No results found for: CHOL, TRIG, HDL, CHOLHDL, VLDL, LDLCALC No results found for: TSH  Therapeutic Level Labs: No results found for: LITHIUM No results found for: VALPROATE No components found for:  CBMZ  Current Medications: Current Outpatient Medications  Medication Sig Dispense Refill  . acetaminophen (TYLENOL 8 HOUR ARTHRITIS PAIN) 650 MG CR tablet Take 650 mg by mouth every 8 (eight) hours as needed for pain.    Marland Kitchen alum & mag hydroxide-simeth (MAALOX/MYLANTA) 200-200-20 MG/5ML suspension Take 30 mLs by mouth every 6 (six) hours as needed for indigestion or heartburn.    Marland Kitchen aspirin 81 MG tablet Take 81 mg by mouth at bedtime.     Marland Kitchen atorvastatin (LIPITOR) 10 MG tablet Take 10 mg by mouth every evening.     . calcium carbonate (TUMS - DOSED IN MG ELEMENTAL CALCIUM) 500 MG chewable tablet Chew 1 tablet by mouth daily as needed for indigestion or heartburn.    . escitalopram (LEXAPRO) 20 MG tablet Take 1 tablet (20 mg total) by mouth daily. 30 tablet 2  . fentaNYL (DURAGESIC - DOSED MCG/HR) 75 MCG/HR Place 1 patch (75 mcg total) onto the skin every 3 (three) days. 10 patch 0  . ibuprofen (ADVIL,MOTRIN) 200 MG tablet Take 200 mg by mouth 2 (two) times daily as needed for moderate pain.     Marland Kitchen lamoTRIgine (LAMICTAL) 100 MG tablet Take 1 tablet (100 mg total) by mouth  at bedtime. 60 tablet 2  . latanoprost (XALATAN) 0.005 % ophthalmic solution Place 1 drop into both eyes at bedtime.    Marland Kitchen levothyroxine (SYNTHROID, LEVOTHROID) 88 MCG tablet Take 88 mcg by mouth daily before breakfast.     . lisinopril (PRINIVIL,ZESTRIL) 10 MG tablet Take 10 mg by mouth every morning.     . loratadine (CLARITIN) 10 MG tablet Take 10 mg by mouth daily as needed for allergies.    . magnesium oxide (MAG-OX) 400 MG tablet Take 400 mg by mouth 2 (two) times daily.    . metoprolol succinate (TOPROL-XL) 25 MG 24 hr tablet TAKE 1/2 TABLET BY MOUTH EVERY MORNING 15 tablet 0  . polyethylene glycol (MIRALAX / GLYCOLAX) packet Take 17 g by mouth daily as needed for mild constipation.     Marland Kitchen ALPRAZolam (XANAX) 0.25 MG tablet Take 1 tablet (0.25 mg total) by mouth daily as needed for anxiety. 30 tablet 2   No current facility-administered medications for this visit.      Musculoskeletal: Strength & Muscle Tone: Decreased Gait & Station: unsteady Patient leans: N/A  Psychiatric Specialty Exam: Review of Systems  Musculoskeletal: Positive for back pain.  Neurological: Positive for tremors.  Psychiatric/Behavioral: The patient is nervous/anxious.   All other systems reviewed and are negative.   Blood pressure (!) 188/76, pulse 79, height 5\' 2"  (1.575 m), weight 190 lb (86.2 kg), SpO2 95 %.Body mass index is 34.75 kg/m.  General Appearance: Casual, Neat and Well Groomed  Eye Contact:  Good  Speech:  Clear and Coherent  Volume:  Normal  Mood:  Anxious and Dysphoric  Affect:  Congruent  Thought Process:  Goal Directed  Orientation:  Full (Time, Place, and Person)  Thought Content: Rumination   Suicidal Thoughts:  No  Homicidal Thoughts:  No  Memory:  Immediate;   Good Recent;   Fair Remote;   Fair  Judgement:  Fair  Insight:  Lacking  Psychomotor Activity:  Tremor  Concentration:  Concentration: Fair and  Attention Span: Fair  Recall:  Good  Fund of Knowledge: Good   Language: Good  Akathisia:  No  Handed:  Right  AIMS (if indicated): not done  Assets:  Communication Skills Desire for Improvement Resilience Social Support Talents/Skills  ADL's:  Intact  Cognition: WNL  Sleep:  Good   Screenings: GAD-7     Counselor from 09/15/2016 in [facility]  Total GAD-7 Score  10       Assessment and Plan: This patient is a 77 year old female who developed significant anxiety after going through cancer treatment.  She still very anxious and shaky.  She and her sister think this is secondary to the Lamictal so we will begin a taper.  She will decrease from 150 mg daily to 100 mg only at bedtime.  She will continue Lexapro 20 mg daily to help with depression and anxiety.  She will also start back on Xanax 0.25 mg daily only as needed for anxiety.  She will continue her counseling and return to see me in 4 weeks   [name], MD 12/16/2017, 11:20 AM

## 2017-12-18 ENCOUNTER — Encounter (HOSPITAL_COMMUNITY): Payer: Self-pay | Admitting: Psychiatry

## 2017-12-18 ENCOUNTER — Ambulatory Visit (INDEPENDENT_AMBULATORY_CARE_PROVIDER_SITE_OTHER): Payer: Medicare Other | Admitting: Psychiatry

## 2017-12-18 DIAGNOSIS — F411 Generalized anxiety disorder: Secondary | ICD-10-CM | POA: Diagnosis not present

## 2017-12-18 NOTE — Progress Notes (Signed)
  Patient:  Yesenia Reynolds   DOB: 10-06-1941  MR Number: [id number]  Location: [facility]:  [address] [place],  [place], [address]  Start:   Friday 12/18/2017 10:09 AM  End: Friday 12/18/2017 10:59 AM    Provider/Observer:     Maurice Small, MSW, LCSW     Chief Complaint:               Anxiety, stress          Reason For Service:     Yesenia Reynolds is a 77 year old female who presents with symptoms of anxiety that began after she had treatment for cancer in 2016. Prior to treatment, patient reports being very independent and outgoing. After treatment, she became fearful of staying alone, driving, and being away from home. She reports excessive worry and says she developed shakiness. She also reports nervousness and decreased so  cial involvement.  Interventions Strategy:  Supportive/CBT       Participation Level:   Active  Participation Quality:  Appropriate      Behavioral Observation:  Casual, Alert, anxious,  Current Psychosocial Factors: Patient resides in a senior living community, Conflict with housemate   Content of Session:   reviewed symptoms, facilitated expression of thoughts and feelings about moving back to her home, discussed steps to plan meeting with roommate, assisted patient identify realistic expectations and ways to improve  assertive communication with housemate about her concerns regarding moving back home, assisted patient identify and worry plays thoughts but inhibit effective assertion with healthy alternatives   Current Status:   decreased anxiety, decreased excessive worry, decreased trembling of hands/arms,  Suicidal/Homicidal:    No          Patient Progress:    Patient reports decreased trembling since taking decreased dosage of Lamictal as instructed by psychiatrist Dr. Harrington Challenger. She reports decreased worry since deciding to have a meeting with her roommate and a mutual friend to discuss possibility of patient moving back home. Patient  has concerns roommate expects her to do tasks beyond her capabilities. She also reports roommate has a history of making hurtful comments to patient in the past. Meeting has not been scheduled yet but patient is experiencing some relief. She reports enjoying celebrating the holidays with her family. She also reports enjoying being at home with her roommate on Christmas Day.   Target Goals:   1. Learn and implement coping strategies to reduce/manage anxiety.     2. Identify/challenge/and replace negative anxiety provoking self talk with realistic healthy alternatives ,    3, Identify relapse prevention strategies for managing possible future anxiety symptoms  Last Reviewed:   10/22/2017  Goals Addressed Today:    1,2,3  Plan                  Return again in   3-4 weeks.  Impression/Diagnosis:  Patient presents with symptoms of anxiety that began after she had treatment for cancer in 2016. She reports excessive worry, motor tension, and social withdrawal.  She worries about a variety of issues.      Diagnosis:  Axis I: Generalized anxiety disorder          Axis II: Deferred   Sanii Kukla, LCSW 12/18/2017

## 2017-12-23 ENCOUNTER — Other Ambulatory Visit (HOSPITAL_COMMUNITY): Payer: Self-pay | Admitting: Adult Health

## 2017-12-23 ENCOUNTER — Encounter (HOSPITAL_COMMUNITY): Payer: Self-pay | Admitting: Adult Health

## 2017-12-23 DIAGNOSIS — C541 Malignant neoplasm of endometrium: Secondary | ICD-10-CM

## 2017-12-23 MED ORDER — FENTANYL 75 MCG/HR TD PT72: 75.0000 ug | MEDICATED_PATCH | TRANSDERMAL | 0 refills | Status: DC

## 2017-12-23 NOTE — Progress Notes (Signed)
Patient called cancer center requesting refill of Fentanyl patch.   [place] Controlled Substance Reporting System reviewed and refill is appropriate on or after 12/25/17. Paper prescription printed & post-dated; Rx left at cancer center front desk for patient to retrieve after showing photo ID per clinic policy.   NCCSRS reviewed:     Mike Craze, NP [place] [phone]

## 2017-12-24 ENCOUNTER — Other Ambulatory Visit (HOSPITAL_COMMUNITY): Payer: Self-pay | Admitting: Adult Health

## 2017-12-24 DIAGNOSIS — C541 Malignant neoplasm of endometrium: Secondary | ICD-10-CM

## 2017-12-24 MED ORDER — FENTANYL 75 MCG/HR TD PT72: 75.0000 ug | MEDICATED_PATCH | TRANSDERMAL | 0 refills | Status: DC

## 2018-01-18 ENCOUNTER — Ambulatory Visit (INDEPENDENT_AMBULATORY_CARE_PROVIDER_SITE_OTHER): Payer: Medicare Other | Admitting: Psychiatry

## 2018-01-18 ENCOUNTER — Encounter (HOSPITAL_COMMUNITY): Payer: Self-pay | Admitting: Psychiatry

## 2018-01-18 DIAGNOSIS — F411 Generalized anxiety disorder: Secondary | ICD-10-CM | POA: Diagnosis not present

## 2018-01-18 NOTE — Progress Notes (Signed)
  Patient:  Yesenia Reynolds   DOB: 04-Dec-1941  MR Number: [id number]  Location: [facility]:  [address] [place],  [place], [address]  Start:   Monday 01/18/2018 11:15 AM  End: Monday 01/18/2018 11:48 AM     Provider/Observer:     Maurice Small, MSW, LCSW     Chief Complaint:               Anxiety, stress          Reason For Service:     Yesenia Reynolds is a 77 year old female who presents with symptoms of anxiety that began after she had treatment for cancer in 2016. Prior to treatment, patient reports being very independent and outgoing. After treatment, she became fearful of staying alone, driving, and being away from home. She reports excessive worry and says she developed shakiness. She also reports nervousness and decreased so  cial involvement.  Interventions Strategy:  Supportive/ACT       Participation Level:   Active  Participation Quality:  Appropriate      Behavioral Observation:  Casual, Alert, anxious,  Current Psychosocial Factors: Patient resides in a senior living community, Conflict with housemate   Content of Session:   reviewed symptoms, facilitated expression of thoughts and feelings regarding recent 4 day visit to her home, praised and reinforced patient's use of assertiveness skills, assisted patient identify triggers of increased anxiety, used cognitive defusion technique to assist patient cope with ruminating thoughts, reviewed mindfulness technique and encouraged patient to use daily    Current Status:   increased anxiety, increased excessive worry, increased trembling of hands/arms, depressed mood  Suicidal/Homicidal:    No          Patient Progress:    Patient reports increased anxiety and worry since last session as well as increased depressed mood. She states now having more bad days than good days. She reports she did use assertiveness skills in recent conversation with her housemate and had a 4 day visit which went well. Patient  states she still is not ready to return to her home but is  unable to specify a reason. She also reports increased thoughts of not wanting to bother her sister to take her for medical appointments and feelings of guilt saying she is taking up too much of her sister's time although sister has made no complaints per patient's report. Patient is not feeling well today she reports significant sleep difficulty last night.   Target Goals:   1. Learn and implement coping strategies to reduce/manage anxiety.     2. Identify/challenge/and replace negative anxiety provoking self talk with realistic healthy alternatives ,    3, Identify relapse prevention strategies for managing possible future anxiety symptoms  Last Reviewed:   10/22/2017  Goals Addressed Today:    1,2,3  Plan                  Return again in   3-4 weeks.  Impression/Diagnosis:  Patient presents with symptoms of anxiety that began after she had treatment for cancer in 2016. She reports excessive worry, motor tension, and social withdrawal.  She worries about a variety of issues.      Diagnosis:  Axis I: Generalized anxiety disorder          Axis II: Deferred   Brei Pociask, LCSW 01/18/2018

## 2018-01-20 ENCOUNTER — Other Ambulatory Visit (HOSPITAL_COMMUNITY): Payer: Self-pay | Admitting: Adult Health

## 2018-01-20 DIAGNOSIS — C541 Malignant neoplasm of endometrium: Secondary | ICD-10-CM

## 2018-01-21 ENCOUNTER — Encounter (HOSPITAL_COMMUNITY): Payer: Self-pay | Admitting: Adult Health

## 2018-01-21 NOTE — Progress Notes (Signed)
Received refill request from pt's pharmacy for Fentanyl.   [place] Controlled Substance Reporting System reviewed and refill is appropriate on or after 01/24/18. Medication e-scribed to her pharmacy [facility]) using [employer]'s 2-step verification process.    NCCSRS reviewed:     Mike Craze, NP [place] [phone]

## 2018-01-26 ENCOUNTER — Encounter (HOSPITAL_COMMUNITY): Payer: Self-pay | Admitting: Psychiatry

## 2018-01-26 ENCOUNTER — Ambulatory Visit (INDEPENDENT_AMBULATORY_CARE_PROVIDER_SITE_OTHER): Payer: Medicare Other | Admitting: Psychiatry

## 2018-01-26 VITALS — BP 166/69 | HR 77 | Ht 62.0 in | Wt 198.0 lb

## 2018-01-26 DIAGNOSIS — R450 Nervousness: Secondary | ICD-10-CM

## 2018-01-26 DIAGNOSIS — F411 Generalized anxiety disorder: Secondary | ICD-10-CM

## 2018-01-26 DIAGNOSIS — M549 Dorsalgia, unspecified: Secondary | ICD-10-CM | POA: Diagnosis not present

## 2018-01-26 MED ORDER — ALPRAZOLAM 0.25 MG PO TABS: 0.2500 mg | ORAL_TABLET | Freq: Every day | ORAL | 2 refills | Status: DC | PRN

## 2018-01-26 MED ORDER — SERTRALINE HCL 50 MG PO TABS: 50.0000 mg | ORAL_TABLET | Freq: Every day | ORAL | 2 refills | Status: DC

## 2018-01-26 MED ORDER — LAMOTRIGINE 25 MG PO TABS: 50.0000 mg | ORAL_TABLET | Freq: Every day | ORAL | 2 refills | Status: DC

## 2018-01-26 NOTE — Progress Notes (Signed)
[place] MD/PA/NP OP Progress Note  01/26/2018 10:46 AM LENISE JR  MRN:  [id number]  Chief Complaint:  Chief Complaint    Depression; Anxiety; Follow-up     HPI: This patient is a 77 year old single white female who livesIn a rest home. 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  The patient was referred by her oncologist for further treatment and assessment of severe anxiety.  The patient states that she has had no prior psychiatric assessment or treatment. She began to have endometrial bleeding in April 2016. She was found to have endometrial cancer and underwent hysterectomy. Following that she had 2 bouts of chemotherapy followed by radiation and eventually intravaginal radiation. She states that she finished all these treatments in October of 2016. She states that she did very well throughout the treatments and stayed calm and did not have significant problems with nausea and vomiting.  In November 2016 she began to have significant problems with anxiety. She didn't want anyone coming around and visiting her she felt extremely anxious being around people are going out to restaurants or to church. She is been a very gregarious person all her life and her personality totally changed. She denied being depressed sad or crying. She was shaking all the time and couldn't stay alone. She still can't stay alone. She stopped driving her car. She gave up things she enjoyed like going to her nephew's baseball games. She also could not watch anything frightening like police shows on TV because she would become extremely anxious. She is eating fairly well but lost a good deal of weight during her treatments. She is sleeping well and denies nightmares. She states that she had short-term memory loss shortly after treatment but this is gotten much better.  She was tried on Paxil but it was not helpful. Her doctor has her on Xanax 0.25 mg which does help but she  takes it as needed. Her gynecologist put her on Lamictal and she is up to 150 mg a day. She thinks this is helped more than anything else because she is less anxious than she was and is slowly starting to drive a little bit and be around a few people at a time. Her hand still shake at times. She would like to get out and do all the things she used to do such as drive on her own go to baseball games enjoy her friends and church but she is beginning to realize that this is going to be a slow process. She denies suicidal ideation or auditory or visual hallucinations  Patient returns after 2 months with her sister.  Last time we cut down her Lamictal and she is shaking a lot less.  I gave her a very low dose of Xanax, 0.25 mg and she is not sure it is doing much of anything for but she does not take it very often.  She is sleeping well but her energy is low and she is having "a lot of bad days."  By bad she means more anxious than depressed.  She was spent 1 week and at home with her roommate but stated that by the third day it went downhill and she went back to the rest home.  She has gone out with friends and out with her sister a couple of times and has gone well.  She seems scared to push herself to do much more than she is doing.  She still needs to make a decision about going back home and she seems closer to it and she was in the past.  Her sisters convinced that the Lamictal is still causing some tremor so we will cut it down to 50 mg.  Since she is still anxious and somewhat depressed we will switch from Lexapro to Zoloft. Visit Diagnosis:    ICD-10-CM   1. Generalized anxiety disorder F41.1     Past Psychiatric History: none  Past Medical History:  Past Medical History:  Diagnosis Date  . Agoraphobia with panic attacks 08/07/2016  . Anxiety   . Arthritis   . Atrial fibrillation, currently in sinus rhythm   . Cancer [facility])    Endometrial  . Depression   . Endometrial cancer, FIGO stage IIIC  ([place]) 08/08/2015  . Essential hypertension, benign   . GERD (gastroesophageal reflux disease)   . Hypothyroidism   . Mixed hyperlipidemia   . Palpitations   . Spinal stenosis   . Spinal stenosis of lumbar region at multiple levels 08/07/2016  . Type 2 diabetes mellitus ([place])     Past Surgical History:  Procedure Laterality Date  . ABDOMINAL HYSTERECTOMY    . BREAST BIOPSY     benign  . CATARACT EXTRACTION W/PHACO Left 03/31/2017   Procedure: CATARACT EXTRACTION PHACO AND INTRAOCULAR LENS PLACEMENT (IOC);  Surgeon: Rutherford Guys, MD;  Location: [facility];  Service: Ophthalmology;  Laterality: Left;  CDE: 8.67  . CATARACT EXTRACTION W/PHACO Right 04/14/2017   Procedure: CATARACT EXTRACTION PHACO AND INTRAOCULAR LENS PLACEMENT RIGHT EYE;  Surgeon: Rutherford Guys, MD;  Location: [facility];  Service: Ophthalmology;  Laterality: Right;  CDE: 8.52  . LIPOMA EXCISION    . Multiple dental extractions    . PORT-A-CATH REMOVAL Right 09/02/2017   Procedure: MINOR REMOVAL PORT-A-CATH;  Surgeon: Aviva Signs, MD;  Location: [facility];  Service: General;  Laterality: Right;  . TONSILLECTOMY AND ADENOIDECTOMY    . TOTAL KNEE ARTHROPLASTY  08/04/2012   Procedure: TOTAL KNEE ARTHROPLASTY;  Surgeon: Gearlean Alf, MD;  Location: [facility];  Service: Orthopedics;  Laterality: Left;    Family Psychiatric History: none  Family History:  Family History  Problem Relation Age of Onset  . Stroke Mother   . Heart failure Mother   . Hypertension Father   . Coronary artery disease Father     Social History:  Social History   Socioeconomic History  . Marital status: Single    Spouse name: None  . Number of children: None  . Years of education: None  . Highest education level: None  Social Needs  . Financial resource strain: None  . Food insecurity - worry: None  . Food insecurity - inability: None  . Transportation needs - medical: None  . Transportation needs - non-medical: None  Occupational History  . None   Tobacco Use  . Smoking status: Never Smoker  . Smokeless tobacco: Never Used  Substance and Sexual Activity  . Alcohol use: No    Comment: 07/10/2016 per pt no  . Drug use: No    Comment: 8/3/2017per pt no   . Sexual activity: No  Other Topics Concern  . None  Social History Narrative  . None    Allergies:  Allergies  Allergen Reactions  . Morphine And Related Other (See Comments)    Hypotension  . Other     Preservative in latanoprost causes redness, pt uses preservative free latanoprost   . Paxil [Paroxetine Hcl]  unknown  . Latex Rash    Metabolic Disorder Labs: No results found for: HGBA1C, MPG No results found for: PROLACTIN No results found for: CHOL, TRIG, HDL, CHOLHDL, VLDL, LDLCALC No results found for: TSH  Therapeutic Level Labs: No results found for: LITHIUM No results found for: VALPROATE No components found for:  CBMZ  Current Medications: Current Outpatient Medications  Medication Sig Dispense Refill  . acetaminophen (TYLENOL 8 HOUR ARTHRITIS PAIN) 650 MG CR tablet Take 650 mg by mouth every 8 (eight) hours as needed for pain.    Marland Kitchen ALPRAZolam (XANAX) 0.25 MG tablet Take 1 tablet (0.25 mg total) by mouth daily as needed for anxiety. 30 tablet 2  . alum & mag hydroxide-simeth (MAALOX/MYLANTA) 200-200-20 MG/5ML suspension Take 30 mLs by mouth every 6 (six) hours as needed for indigestion or heartburn.    Marland Kitchen aspirin 81 MG tablet Take 81 mg by mouth at bedtime.     Marland Kitchen atorvastatin (LIPITOR) 10 MG tablet Take 10 mg by mouth every evening.     . calcium carbonate (TUMS - DOSED IN MG ELEMENTAL CALCIUM) 500 MG chewable tablet Chew 1 tablet by mouth daily as needed for indigestion or heartburn.    . fentaNYL (DURAGESIC - DOSED MCG/HR) 75 MCG/HR PLACE ONE PATCH ONTO THE SKIN EVERY 3 DAYS 10 patch 0  . ibuprofen (ADVIL,MOTRIN) 200 MG tablet Take 200 mg by mouth 2 (two) times daily as needed for moderate pain.     Marland Kitchen latanoprost (XALATAN) 0.005 % ophthalmic solution  Place 1 drop into both eyes at bedtime.    Marland Kitchen levothyroxine (SYNTHROID, LEVOTHROID) 88 MCG tablet Take 88 mcg by mouth daily before breakfast.     . lisinopril (PRINIVIL,ZESTRIL) 10 MG tablet Take 10 mg by mouth every morning.     . loratadine (CLARITIN) 10 MG tablet Take 10 mg by mouth daily as needed for allergies.    . magnesium oxide (MAG-OX) 400 MG tablet Take 400 mg by mouth 2 (two) times daily.    . metoprolol succinate (TOPROL-XL) 25 MG 24 hr tablet TAKE 1/2 TABLET BY MOUTH EVERY MORNING 15 tablet 0  . polyethylene glycol (MIRALAX / GLYCOLAX) packet Take 17 g by mouth daily as needed for mild constipation.     Marland Kitchen lamoTRIgine (LAMICTAL) 25 MG tablet Take 2 tablets (50 mg total) by mouth at bedtime. 30 tablet 2  . sertraline (ZOLOFT) 50 MG tablet Take 1 tablet (50 mg total) by mouth daily. 30 tablet 2   No current facility-administered medications for this visit.      Musculoskeletal: Strength & Muscle Tone: decreased Gait & Station: unsteady Patient leans: N/A  Psychiatric Specialty Exam: Review of Systems  Musculoskeletal: Positive for back pain.  Psychiatric/Behavioral: Positive for depression. The patient is nervous/anxious.   All other systems reviewed and are negative.   Blood pressure (!) 166/69, pulse 77, height 5\' 2"  (1.575 m), weight 198 lb (89.8 kg), SpO2 94 %.Body mass index is 36.21 kg/m.  General Appearance: Casual and Fairly Groomed  Eye Contact:  Good  Speech:  Clear and Coherent  Volume:  Decreased  Mood:  Anxious and Dysphoric  Affect:  Constricted and Tearful  Thought Process:  Goal Directed  Orientation:  Full (Time, Place, and Person)  Thought Content: Rumination   Suicidal Thoughts:  No  Homicidal Thoughts:  No  Memory:  Immediate;   Good Recent;   Good Remote;   Fair  Judgement:  Fair  Insight:  Lacking  Psychomotor Activity:  Decreased  Concentration:  Concentration: Fair and Attention Span: Fair  Recall:  Good  Fund of Knowledge: Good   Language: Good  Akathisia:  No  Handed:  Right  AIMS (if indicated): not done  Assets:  Communication Skills Desire for Improvement Resilience Social Support Talents/Skills  ADL's:  Intact  Cognition: WNL  Sleep:  Good   Screenings: GAD-7     Counselor from 09/15/2016 in [facility] [place]  Total GAD-7 Score  10       Assessment and Plan: This 77 year old female with recent history of significant depression and anxiety.  She still has important decisions to make about her living situation and these decisions are making her very anxious.  She is having more bad days than good so we will discontinue Lexapro and start Zoloft 50 mg daily.  She will decrease Lamictal to 50 mg daily due to tremor and she will continue Xanax 0.25 mg daily as needed for anxiety.  She will continue her counseling here and return to see me in 6 weeks   [name], MD 01/26/2018, 10:46 AM

## 2018-01-28 ENCOUNTER — Ambulatory Visit (HOSPITAL_COMMUNITY): Payer: Self-pay | Admitting: Adult Health

## 2018-01-28 ENCOUNTER — Other Ambulatory Visit (HOSPITAL_COMMUNITY): Payer: Self-pay

## 2018-02-08 ENCOUNTER — Other Ambulatory Visit (HOSPITAL_COMMUNITY): Payer: Self-pay | Admitting: Psychiatry

## 2018-02-08 ENCOUNTER — Telehealth (HOSPITAL_COMMUNITY): Payer: Self-pay | Admitting: *Deleted

## 2018-02-08 NOTE — Telephone Encounter (Signed)
Instructed pt to stop zoloft, go back on lexapro 20 mg

## 2018-02-08 NOTE — Telephone Encounter (Signed)
Dr Harrington Challenger Patient called stating that all weekend has been rough for her. Stated that since starting the Zoloft  She has been  shaky inside & out & is losing her appetite. She  Is looking for direction  # [phone]

## 2018-02-18 ENCOUNTER — Inpatient Hospital Stay (HOSPITAL_COMMUNITY): Payer: Medicare Other

## 2018-02-18 ENCOUNTER — Encounter (INDEPENDENT_AMBULATORY_CARE_PROVIDER_SITE_OTHER): Payer: Self-pay | Admitting: *Deleted

## 2018-02-18 ENCOUNTER — Encounter (HOSPITAL_COMMUNITY): Payer: Self-pay | Admitting: Adult Health

## 2018-02-18 ENCOUNTER — Inpatient Hospital Stay (HOSPITAL_COMMUNITY): Payer: Medicare Other | Attending: Adult Health | Admitting: Adult Health

## 2018-02-18 ENCOUNTER — Other Ambulatory Visit: Payer: Self-pay

## 2018-02-18 VITALS — BP 172/70 | HR 93 | Temp 99.5°F | Resp 18 | Wt 195.0 lb

## 2018-02-18 DIAGNOSIS — G8929 Other chronic pain: Secondary | ICD-10-CM | POA: Diagnosis not present

## 2018-02-18 DIAGNOSIS — C541 Malignant neoplasm of endometrium: Secondary | ICD-10-CM | POA: Diagnosis not present

## 2018-02-18 DIAGNOSIS — F418 Other specified anxiety disorders: Secondary | ICD-10-CM | POA: Insufficient documentation

## 2018-02-18 DIAGNOSIS — R197 Diarrhea, unspecified: Secondary | ICD-10-CM | POA: Insufficient documentation

## 2018-02-18 DIAGNOSIS — N898 Other specified noninflammatory disorders of vagina: Secondary | ICD-10-CM | POA: Diagnosis not present

## 2018-02-18 DIAGNOSIS — F419 Anxiety disorder, unspecified: Secondary | ICD-10-CM

## 2018-02-18 DIAGNOSIS — R921 Mammographic calcification found on diagnostic imaging of breast: Secondary | ICD-10-CM | POA: Diagnosis not present

## 2018-02-18 DIAGNOSIS — F329 Major depressive disorder, single episode, unspecified: Secondary | ICD-10-CM

## 2018-02-18 LAB — COMPREHENSIVE METABOLIC PANEL
ALBUMIN: 3.9 g/dL (ref 3.5–5.0)
ALK PHOS: 108 U/L (ref 38–126)
ALT: 15 U/L (ref 14–54)
ANION GAP: 11 (ref 5–15)
AST: 21 U/L (ref 15–41)
BUN: 16 mg/dL (ref 6–20)
CALCIUM: 9.1 mg/dL (ref 8.9–10.3)
CHLORIDE: 98 mmol/L — AB (ref 101–111)
CO2: 30 mmol/L (ref 22–32)
Creatinine, Ser: 0.79 mg/dL (ref 0.44–1.00)
GFR calc Af Amer: >60 mL/min (ref >60)
GFR calc non Af Amer: >60 mL/min (ref >60)
Glucose, Bld: 146 mg/dL — ABNORMAL HIGH (ref 65–99)
Potassium: 3.7 mmol/L (ref 3.5–5.1)
SODIUM: 139 mmol/L (ref 135–145)
Total Bilirubin: 0.7 mg/dL (ref 0.3–1.2)
Total Protein: 6.6 g/dL (ref 6.5–8.1)

## 2018-02-18 LAB — CBC WITH DIFFERENTIAL/PLATELET
BASOS PCT: 0 %
Basophils Absolute: 0 10*3/uL (ref 0.0–0.1)
EOS ABS: 0.1 10*3/uL (ref 0.0–0.7)
Eosinophils Relative: 2 %
HCT: 38.9 % (ref 36.0–46.0)
HEMOGLOBIN: 11.9 g/dL — AB (ref 12.0–15.0)
Lymphocytes Relative: 17 %
Lymphs Abs: 1.1 10*3/uL (ref 0.7–4.0)
MCH: 27.5 pg (ref 26.0–34.0)
MCHC: 30.6 g/dL (ref 30.0–36.0)
MCV: 90 fL (ref 78.0–100.0)
MONOS PCT: 10 %
Monocytes Absolute: 0.6 10*3/uL (ref 0.1–1.0)
NEUTROS PCT: 71 %
Neutro Abs: 4.5 10*3/uL (ref 1.7–7.7)
Platelets: 177 10*3/uL (ref 150–400)
RBC: 4.32 MIL/uL (ref 3.87–5.11)
RDW: 13.1 % (ref 11.5–15.5)
WBC: 6.4 10*3/uL (ref 4.0–10.5)

## 2018-02-18 NOTE — Progress Notes (Signed)
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).  The endometrial cancer found on D&C, grade 2 by Dr. Barrie Dunker.      03/30/2015 Surgery    Robotic hysterectomy, bilateral salpingo-oophorectomy, sentinel lymph node mapping and resection, mini-laparotomy by Dr. Clenton Pare.      04/02/2015 Cancer Staging    Stage IIIc 1, grade 2      04/26/2015 - 10/03/2015 Chemotherapy    The patient had palonosetron (ALOXI) injection 0.25 mg, 0.25 mg, Intravenous,  Once, 6 of 6 cycles  CARBOplatin (PARAPLATIN) in sodium chloride 0.9 % 100 mL chemo infusion, , Intravenous,  Once, 6 of 6 cycles  PACLitaxel (TAXOL) 294 mg in dextrose 5 % 250 mL chemo infusion (> 80mg /m2), 175 mg/m2 = 294 mg, Intravenous,  Once, 6 of 6 cycles  for chemotherapy treatment.        06/27/2015 - 07/31/2015 Radiation Therapy    External beam radiation therapy      08/08/2015 - 08/21/2015 Radiation Therapy    Vaginal cuff intracavitary brachytherapy delivered, August 31, September 6, 08/21/2015. 18 gray in 3 fractions.      02/20/2016 Imaging    CT Abd/Pelvis- no evidence of metastatic disease status post hysterectomy. Improving fluid collection along the right pelvic sidewall, probably postoperative lymphocele or seroma.         INTERVAL HISTORY:  Yesenia Reynolds 77 y.o. female presents for routine follow-up for endometrial cancer.   Here today with her sister. Ambulates with walker.    Overall, she tells me she has been physically been feeling "okay." Appetite and energy levels both 75%. She reports occasional fatigue, dizziness, and diarrhea.  States that  she sometimes does not feel like she has complete control of her bowels, "the stools are formed, but not hard."  She also reports a vaginal odor, which is intermittent.   Denies any vaginal bleeding.   Emotionally, she tells me she has been struggling.  She has been seeing her psychiatrist for anxiety and depression "for 2 years and we don't feel like things are getting any better."  They are worried about her medications; "her tremors are getting better since we cut down on the Lamictal."       REVIEW OF SYSTEMS:  Review of Systems  Constitutional: Positive for fatigue.  HENT:  Negative.   Eyes: Negative.   Respiratory: Negative.   Cardiovascular: Negative.   Gastrointestinal: Positive for diarrhea.  Endocrine: Negative.   Genitourinary: Positive for vaginal discharge. Negative for hematuria and vaginal bleeding.   Skin: Negative.   Neurological: Positive for dizziness.  Hematological: Negative.   Psychiatric/Behavioral: Positive for depression. The patient is nervous/anxious.      PAST MEDICAL/SURGICAL HISTORY:  Past Medical History:  Diagnosis Date  . Agoraphobia with panic attacks 08/07/2016  . Anxiety   . Arthritis   . Atrial fibrillation, currently in sinus rhythm   . Cancer [facility])    Endometrial  . Depression   . Endometrial cancer, FIGO stage IIIC ([facility]) 08/08/2015  . Essential hypertension, benign   . GERD (gastroesophageal reflux disease)   . Hypothyroidism   . Mixed  hyperlipidemia   . Palpitations   . Spinal stenosis   . Spinal stenosis of lumbar region at multiple levels 08/07/2016  . Type 2 diabetes mellitus ([facility])    Past Surgical History:  Procedure Laterality Date  . ABDOMINAL HYSTERECTOMY    . BREAST BIOPSY     benign  . CATARACT EXTRACTION W/PHACO Left 03/31/2017   Procedure: CATARACT EXTRACTION PHACO AND INTRAOCULAR LENS PLACEMENT (IOC);  Surgeon: Rutherford Guys, MD;  Location: [facility];  Service: Ophthalmology;  Laterality: Left;  CDE: 8.67  . CATARACT  EXTRACTION W/PHACO Right 04/14/2017   Procedure: CATARACT EXTRACTION PHACO AND INTRAOCULAR LENS PLACEMENT RIGHT EYE;  Surgeon: Rutherford Guys, MD;  Location: [facility];  Service: Ophthalmology;  Laterality: Right;  CDE: 8.52  . LIPOMA EXCISION    . Multiple dental extractions    . PORT-A-CATH REMOVAL Right 09/02/2017   Procedure: MINOR REMOVAL PORT-A-CATH;  Surgeon: Aviva Signs, MD;  Location: [facility];  Service: General;  Laterality: Right;  . TONSILLECTOMY AND ADENOIDECTOMY    . TOTAL KNEE ARTHROPLASTY  08/04/2012   Procedure: TOTAL KNEE ARTHROPLASTY;  Surgeon: Gearlean Alf, MD;  Location: [facility];  Service: Orthopedics;  Laterality: Left;     SOCIAL HISTORY:  Social History   Socioeconomic History  . Marital status: Single    Spouse name: Not on file  . Number of children: Not on file  . Years of education: Not on file  . Highest education level: Not on file  Social Needs  . Financial resource strain: Not on file  . Food insecurity - worry: Not on file  . Food insecurity - inability: Not on file  . Transportation needs - medical: Not on file  . Transportation needs - non-medical: Not on file  Occupational History  . Not on file  Tobacco Use  . Smoking status: Never Smoker  . Smokeless tobacco: Never Used  Substance and Sexual Activity  . Alcohol use: No    Comment: 07/10/2016 per pt no  . Drug use: No    Comment: 8/3/2017per pt no   . Sexual activity: No  Other Topics Concern  . Not on file  Social History Narrative  . Not on file    FAMILY HISTORY:  Family History  Problem Relation Age of Onset  . Stroke Mother   . Heart failure Mother   . Hypertension Father   . Coronary artery disease Father     CURRENT MEDICATIONS:  Outpatient Encounter Medications as of 02/18/2018  Medication Sig Note  . acetaminophen (TYLENOL 8 HOUR ARTHRITIS PAIN) 650 MG CR tablet Take 650 mg by mouth every 8 (eight) hours as needed for pain.   Marland Kitchen ALPRAZolam (XANAX) 0.25 MG tablet Take 1  tablet (0.25 mg total) by mouth daily as needed for anxiety.   Marland Kitchen alum & mag hydroxide-simeth (MAALOX/MYLANTA) 200-200-20 MG/5ML suspension Take 30 mLs by mouth every 6 (six) hours as needed for indigestion or heartburn.   Marland Kitchen aspirin 81 MG tablet Take 81 mg by mouth at bedtime.    Marland Kitchen atorvastatin (LIPITOR) 10 MG tablet Take 10 mg by mouth every evening.    . calcium carbonate (TUMS - DOSED IN MG ELEMENTAL CALCIUM) 500 MG chewable tablet Chew 1 tablet by mouth daily as needed for indigestion or heartburn.   Marland Kitchen ibuprofen (ADVIL,MOTRIN) 200 MG tablet Take 200 mg by mouth 2 (two) times daily as needed for moderate pain.    Marland Kitchen lamoTRIgine (LAMICTAL) 25 MG tablet Take 2 tablets (50 mg total)  by mouth at bedtime.   Marland Kitchen latanoprost (XALATAN) 0.005 % ophthalmic solution Place 1 drop into both eyes at bedtime. 03/20/2017: Preservative free  . levothyroxine (SYNTHROID, LEVOTHROID) 88 MCG tablet Take 88 mcg by mouth daily before breakfast.    . lisinopril (PRINIVIL,ZESTRIL) 10 MG tablet Take 10 mg by mouth every morning.    . loratadine (CLARITIN) 10 MG tablet Take 10 mg by mouth daily as needed for allergies.   . metoprolol succinate (TOPROL-XL) 25 MG 24 hr tablet TAKE 1/2 TABLET BY MOUTH EVERY MORNING   . polyethylene glycol (MIRALAX / GLYCOLAX) packet Take 17 g by mouth daily as needed for mild constipation.    . sertraline (ZOLOFT) 50 MG tablet Take 1 tablet (50 mg total) by mouth daily.   . [DISCONTINUED] fentaNYL (DURAGESIC - DOSED MCG/HR) 75 MCG/HR PLACE ONE PATCH ONTO THE SKIN EVERY 3 DAYS   . [DISCONTINUED] magnesium oxide (MAG-OX) 400 MG tablet Take 400 mg by mouth 2 (two) times daily.    No facility-administered encounter medications on file as of 02/18/2018.     ALLERGIES:  Allergies  Allergen Reactions  . Morphine And Related Other (See Comments)    Hypotension  . Other     Preservative in latanoprost causes redness, pt uses preservative free latanoprost   . Paxil [Paroxetine Hcl]     unknown  .  Latex Rash     PHYSICAL EXAM:  ECOG Performance status: 2 - Symptomatic; requires occasional assistance.  Vitals:   02/18/18 1104  BP: (!) 172/70  Pulse: 93  Resp: 18  Temp: 99.5 F (37.5 C)  SpO2: 96%   Filed Weights   02/18/18 1104  Weight: 195 lb (88.5 kg)    Physical Exam  Constitutional: She is oriented to person, place, and time and well-developed, well-nourished, and in no distress.  HENT:  Head: Normocephalic.  Mouth/Throat: Oropharynx is clear and moist. No oropharyngeal exudate.  Eyes: Conjunctivae are normal. Pupils are equal, round, and reactive to light. No scleral icterus.  Neck: Normal range of motion. Neck supple.  Cardiovascular: Normal rate and regular rhythm.  Pulmonary/Chest: Effort normal and breath sounds normal. No respiratory distress. She has no wheezes.  Abdominal: Soft. Bowel sounds are normal. There is no tenderness. There is no rebound.  Musculoskeletal: Normal range of motion. She exhibits edema (Trace BLE bilat ).  Ambulates with walker   Lymphadenopathy:    She has no cervical adenopathy.       Right: No supraclavicular adenopathy present.       Left: No supraclavicular adenopathy present.  Neurological: She is alert and oriented to person, place, and time.  Bilateral hand tremors (chronic)  Skin: Skin is warm and dry. No rash noted.  Psychiatric: Memory and judgment normal.  Both anxious and depressed mood/affect   Nursing note and vitals reviewed.    LABORATORY DATA:  I have reviewed the labs as listed.  CBC    Component Value Date/Time   WBC 6.4 02/18/2018 0945   RBC 4.32 02/18/2018 0945   HGB 11.9 (L) 02/18/2018 0945   HCT 38.9 02/18/2018 0945   PLT 177 02/18/2018 0945   MCV 90.0 02/18/2018 0945   MCH 27.5 02/18/2018 0945   MCHC 30.6 02/18/2018 0945   RDW 13.1 02/18/2018 0945   LYMPHSABS 1.1 02/18/2018 0945   MONOABS 0.6 02/18/2018 0945   EOSABS 0.1 02/18/2018 0945   BASOSABS 0.0 02/18/2018 0945   CMP Latest Ref Rng  & Units 02/18/2018 07/29/2017 03/24/2017  Glucose  65 - 99 mg/dL 146(H) 89 108(H)  BUN 6 - 20 mg/dL 16 14 13   Creatinine 0.44 - 1.00 mg/dL 0.79 0.72 0.75  Sodium 135 - 145 mmol/L 139 137 138  Potassium 3.5 - 5.1 mmol/L 3.7 4.2 4.0  Chloride 101 - 111 mmol/L 98(L) 98(L) 96(L)  CO2 22 - 32 mmol/L 30 30 33(H)  Calcium 8.9 - 10.3 mg/dL 9.1 8.8(L) 9.0  Total Protein 6.5 - 8.1 g/dL 6.6 6.3(L) -  Total Bilirubin 0.3 - 1.2 mg/dL 0.7 0.4 -  Alkaline Phos 38 - 126 U/L 108 86 -  AST 15 - 41 U/L 21 16 -  ALT 14 - 54 U/L 15 13(L) -    PENDING LABS:    DIAGNOSTIC IMAGING:  *The following radiologic images and reports have been reviewed independently and agree with below findings.  Last CT abd/pelvis: 02/20/16      PATHOLOGY:     ASSESSMENT & PLAN:   Stage IIIC1 endometrial cancer :  -Diagnosed in 02/2015. Treated at [facility] in [place], [place] under the care of Dr. Tressie Stalker. Completed chemo with Carbo/Taxol x 3 cycles, followed by EBRT which completed on 07/31/15.  Went on to complete 3 additional cycles of chemo through 10/03/15. She also had vaginal cuff brachytherapy x 3 treatments on 08/08/15, 08/14/15, & 08/21/15.  -Last CT abd/pelvis in 02/2016 without evidence of disease. No role for additional imaging, except as clinically indicated.  -Clinically, no evidence of recurrence today. Physically, she is doing well. Emotionally, she is suffering (see below).  -Return to cancer center in 6 months for follow-up with labs.  Will plan on seeing her at least every 6 months through 5 years, then can see her annually vs "graduation" from cancer center after 5 years.       Vaginal odor/discharge:  -This may be secondary to vaginal changes with radiation therapy.  -Recommended she see her benign gynecologist for follow-up and evaluation; she sees Dr. Barrie Dunker in [place].     Anxiety/Depression/Chronic pain disorder:  -Anxiety/depression managed by Dr. Harrington Challenger here in [place].  Patient and  her sister are concerned that her mental health is continuing to decline.  -Currently on Zoloft 50 mg daily, Lamictal 50 mg daily, and Xanax 0.25 mg PRN.  Patient's sister is concerned that this combination of medication is "making my sister worse."  -Patient and sister report their concerns re: her mental health. Yesenia Reynolds is very fearful "of everything." She has someone come to her home twice daily to put together her meds because she is afraid she will take the wrong things. Per pt's sister, "She cannot even make a sandwich by herself because she is afraid she will do something wrong."  Yesenia Reynolds historically has struggled with agoraphobia as well; she is able to leave the house with her sister and feels comfortable doing so.   -They are asking for recommendations on what to do.  Discussed the option of 2nd opinion with a different psychiatrist; they are able to travel to [place].  Shared with them that I would recommend they see Dr. Daron Offer with behavioral health in [place].  They are interested in this referral and I will help facilitate this today.   -We manage her chronic pain with Fentanyl patch 75 mcg alone; she has not required any breakthrough pain medication refills in quite some time. Pain is generally well-controlled.   -Provided support with active listening, validation of concerns, expressive supportive counseling, and problem-solving.    (R) breast  calcifications:  -Records from [facility] reviewed (copies sent to HIM to be scanned into pt's chart).   -Screening mammogram bilat breasts on 10/12/17 showed possible mass and separate area of calcifications in (R) breast. Diagnostic (R) breast mammogram showed likely benign right breast calcifications; radiologist recommends repeat (R) breast mammogram in 6 months to ensure stability. These orders were placed today. Patient prefers to have her mammograms done at the [facility] in [place]. We will help coordinate this  for her.     Colon cancer screening:  -Needs screening colonoscopy. Will place referral to Dr. Laural Golden with GI for consideration for colonoscopy.  -Reports frequent diarrhea. Recommended she stop oral magnesium supplements for now, as they are likely contributing to her diarrhea.  We'll see if her symptoms improve with stopping this supplement.       Dispo:  -Refer to Dr. Daron Offer with psychiatry in [place].  -Refer to Dr. Laural Golden with GI for colonoscopy consideration.  -(R) breast diagnostic mammogram in 04/2018.  -Return to cancer center in 6 months for follow-up with labs.   All questions were answered to patient's stated satisfaction. Encouraged patient to call with any new concerns or questions before her next visit to the cancer center and we can certain see her sooner, if needed.      A total of 30 minutes was spent in face-to-face care of this patient, with greater than 50% of that time spent in counseling and care-coordination.     Orders placed this encounter:  Orders Placed This Encounter  Procedures  . MM DIAG BREAST TOMO UNI RIGHT      Mike Craze, NP [place] [phone]

## 2018-02-18 NOTE — Patient Instructions (Signed)
[place] at [facility]  Discharge Instructions:  We will refer you for a psych consult today in [place]. We will refer you to a GI doctor for a colonoscopy.  Please, Call Dr. Barrie Dunker for a follow up exam. Stop taking your oral magnesium due to diarrhea.  _______________________________________________________________  Thank you for choosing [place] at [facility] to provide your oncology and hematology care.  To afford each patient quality time with our providers, please arrive at least 15 minutes before your scheduled appointment.  You need to re-schedule your appointment if you arrive 10 or more minutes late.  We strive to give you quality time with our providers, and arriving late affects you and other patients whose appointments are after yours.  Also, if you no show three or more times for appointments you may be dismissed from the clinic.  Again, thank you for choosing [facility] at [facility] hope is that these requests will allow you access to exceptional care and in a timely manner. _______________________________________________________________  If you have questions after your visit, please contact our office at [phone] between the hours of 8:30 a.m. and 5:00 p.m. Voicemails left after 4:30 p.m. will not be returned until the following business day. _______________________________________________________________  For prescription refill requests, have your pharmacy contact our office. _______________________________________________________________  Recommendations made by the consultant and any test results will be sent to your referring physician. _______________________________________________________________

## 2018-02-19 ENCOUNTER — Other Ambulatory Visit (HOSPITAL_COMMUNITY): Payer: Self-pay | Admitting: Adult Health

## 2018-02-19 ENCOUNTER — Encounter (HOSPITAL_COMMUNITY): Payer: Self-pay | Admitting: Adult Health

## 2018-02-19 DIAGNOSIS — C541 Malignant neoplasm of endometrium: Secondary | ICD-10-CM

## 2018-02-19 LAB — CA 125: Cancer Antigen (CA) 125: 11.9 U/mL (ref 0.0–38.1)

## 2018-02-19 NOTE — Progress Notes (Signed)
Received pharmacy refill request for Fentanyl.   [place] Controlled Substance Reporting System reviewed and refill is appropriate on or after 02/23/18. Medication e-scribed to her pharmacy [facility]) using [employer]'s 2-step verification process.    NCCSRS reviewed:     Mike Craze, NP [place] [phone]

## 2018-02-22 ENCOUNTER — Encounter (HOSPITAL_COMMUNITY): Payer: Self-pay | Admitting: Psychiatry

## 2018-02-22 ENCOUNTER — Ambulatory Visit (INDEPENDENT_AMBULATORY_CARE_PROVIDER_SITE_OTHER): Payer: Medicare Other | Admitting: Psychiatry

## 2018-02-22 DIAGNOSIS — F411 Generalized anxiety disorder: Secondary | ICD-10-CM | POA: Diagnosis not present

## 2018-02-22 NOTE — Progress Notes (Signed)
  Patient:  SAINA WAAGE   DOB: Aug 15, 1941  MR Number: [id number]  Location: [facility]:  [address] [place],  [place], [address]  Start:   Monday 02/22/2018 11:15 AM  End: Monday 02/22/2018 11:55 AM     Provider/Observer:     Maurice Small, MSW, LCSW     Chief Complaint:               Anxiety, stress          Reason For Service:     KEITH CANCIO is a 77 year old female who presents with symptoms of anxiety that began after she had treatment for cancer in 2016. Prior to treatment, patient reports being very independent and outgoing. After treatment, she became fearful of staying alone, driving, and being away from home. She reports excessive worry and says she developed shakiness. She also reports nervousness and decreased so  cial involvement.  Interventions Strategy:  Supportive/ACT       Participation Level:   Active  Participation Quality:  Appropriate      Behavioral Observation:  Casual, Alert, anxious,  Current Psychosocial Factors: Patient resides in a senior living community, Conflict with housemate   Content of Session:   reviewed symptoms, facilitated expression of thoughts and feelings, assisted patient identify triggers of anxiety and worry, explained observing mind and thinking mind, used cognitive deusion technique to help patient cope with ruminating thoughts and fear about upcoming colonoscopy, reviewed relaxation technique   Current Status:   anxiety, worry, less depressed mood  Suicidal/Homicidal:    No          Patient Progress:    Patient reports less depressed mood and less worry about possibly returning to her home. She states being okay for right now with things the way they are. She reports feeling less pressure from sister about making a decision and says this has helped. She reports she continues to worry about a variety of other issues. Her most prominent worry now is about an upcoming colonoscopy. Patient fears she will  pass out during the prep process as this happened when she was preparing for another colonoscopy. She reports thoughts associated with this tend to  spiral as she fears she may fall.   Target Goals:   1. Learn and implement coping strategies to reduce/manage anxiety.     2. Identify/challenge/and replace negative anxiety provoking self talk with realistic healthy alternatives ,    3, Identify relapse prevention strategies for managing possible future anxiety symptoms  Last Reviewed:   10/22/2017  Goals Addressed Today:    1,2,3  Plan                  Return again in   3-4 weeks.  Impression/Diagnosis:  Patient presents with symptoms of anxiety that began after she had treatment for cancer in 2016. She reports excessive worry, motor tension, and social withdrawal.  She worries about a variety of issues.      Diagnosis:  Axis I: Generalized anxiety disorder          Axis II: Deferred   Lukah Goswami, LCSW 02/22/2018

## 2018-03-04 DIAGNOSIS — H401131 Primary open-angle glaucoma, bilateral, mild stage: Secondary | ICD-10-CM | POA: Diagnosis not present

## 2018-03-09 ENCOUNTER — Ambulatory Visit (HOSPITAL_COMMUNITY): Payer: Self-pay | Admitting: Psychiatry

## 2018-03-10 ENCOUNTER — Encounter (HOSPITAL_COMMUNITY): Payer: Self-pay | Admitting: Psychiatry

## 2018-03-10 ENCOUNTER — Ambulatory Visit (INDEPENDENT_AMBULATORY_CARE_PROVIDER_SITE_OTHER): Payer: Medicare Other | Admitting: Psychiatry

## 2018-03-10 VITALS — BP 162/77 | HR 86 | Ht 62.0 in | Wt 197.0 lb

## 2018-03-10 DIAGNOSIS — R450 Nervousness: Secondary | ICD-10-CM | POA: Diagnosis not present

## 2018-03-10 DIAGNOSIS — F411 Generalized anxiety disorder: Secondary | ICD-10-CM

## 2018-03-10 DIAGNOSIS — Z8589 Personal history of malignant neoplasm of other organs and systems: Secondary | ICD-10-CM | POA: Diagnosis not present

## 2018-03-10 DIAGNOSIS — M549 Dorsalgia, unspecified: Secondary | ICD-10-CM | POA: Diagnosis not present

## 2018-03-10 MED ORDER — ALPRAZOLAM 0.25 MG PO TABS
0.2500 mg | ORAL_TABLET | Freq: Every day | ORAL | 2 refills | Status: DC | PRN
Start: 2018-03-10 — End: 2018-04-19

## 2018-03-10 MED ORDER — ESCITALOPRAM OXALATE 10 MG PO TABS: 10.0000 mg | ORAL_TABLET | Freq: Every day | ORAL | 2 refills | Status: DC

## 2018-03-10 MED ORDER — LAMOTRIGINE 25 MG PO TABS: 25.0000 mg | ORAL_TABLET | Freq: Every day | ORAL | 2 refills | Status: DC

## 2018-03-10 NOTE — Progress Notes (Signed)
BH MD/PA/NP OP Progress Note  03/10/2018 2:33 PM Yesenia Reynolds  MRN:  [id number]  Chief Complaint:  Chief Complaint    Depression; Anxiety; Follow-up     HPI: This patient is a 77 year old single white female who livesIn a rest home. 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  The patient was referred by her oncologist for further treatment and assessment of severe anxiety.  The patient states that she has had no prior psychiatric assessment or treatment. She began to have endometrial bleeding in April 2016. She was found to have endometrial cancer and underwent hysterectomy. Following that she had 2 bouts of chemotherapy followed by radiation and eventually intravaginal radiation. She states that she finished all these treatments in October of 2016. She states that she did very well throughout the treatments and stayed calm and did not have significant problems with nausea and vomiting.  In November 2016 she began to have significant problems with anxiety. She didn't want anyone coming around and visiting her she felt extremely anxious being around people are going out to restaurants or to church. She is been a very gregarious person all her life and her personality totally changed. She denied being depressed sad or crying. She was shaking all the time and couldn't stay alone. She still can't stay alone. She stopped driving her car. She gave up things she enjoyed like going to her nephew's baseball games. She also could not watch anything frightening like police shows on TV because she would become extremely anxious. She is eating fairly well but lost a good deal of weight during her treatments. She is sleeping well and denies nightmares. She states that she had short-term memory loss shortly after treatment but this is gotten much better.  She was tried on Paxil but it was not helpful. Her doctor has her on Xanax 0.25 mg which does help but she  takes it as needed. Her gynecologist put her on Lamictal and she is up to 150 mg a day. She thinks this is helped more than anything else because she is less anxious than she was and is slowly starting to drive a little bit and be around a few people at a time. Her hand still shake at times. She would like to get out and do all the things she used to do such as drive on her own go to baseball games enjoy her friends and church but she is beginning to realize that this is going to be a slow process. She denies suicidal ideation or auditory or visual hallucinations  The patient and her sister return after 2 months.  She expressed her frustration with her psychiatric care to her oncology PA and she had been referred to Dr. Daron Offer in our [place] office.  I explained that we also have another doctor at our own office so she does not have to travel that far but for now she would like to stay with me as we are going to try to ameliorate her anxiety as best we can.  We tried Zoloft but that made her feel worse.  She is gone back to Lexapro 20 mg daily.  Her shaking is gotten better since we decreased the Lamictal and I suggest we decrease it some more and go down to 25 mg daily.  I also suggested we drop down the Lexapro to 10 mg daily as some of the shaking and anxiety may be  side effects rather than good effects of the medication.  One thing we know for sure is that the Xanax 0.25 mg helps her is a very small dose so I suggested she take this every day for a while.  She still having a lot of difficulty making a decision about whether to go back to live with her roommate in the [place].  Longer she avoids it the more anxious she gets.  Her sister and I strongly urged her to go spend a few days or so she can "tested out."  She agrees to try  Visit Diagnosis:    ICD-10-CM   1. Generalized anxiety disorder F41.1     Past Psychiatric History: none  Past Medical History:  Past Medical History:  Diagnosis Date  .  Agoraphobia with panic attacks 08/07/2016  . Anxiety   . Arthritis   . Atrial fibrillation, currently in sinus rhythm   . Cancer [facility])    Endometrial  . Depression   . Endometrial cancer, FIGO stage IIIC ([place]) 08/08/2015  . Essential hypertension, benign   . GERD (gastroesophageal reflux disease)   . Hypothyroidism   . Mixed hyperlipidemia   . Palpitations   . Spinal stenosis   . Spinal stenosis of lumbar region at multiple levels 08/07/2016  . Type 2 diabetes mellitus ([place])     Past Surgical History:  Procedure Laterality Date  . ABDOMINAL HYSTERECTOMY    . BREAST BIOPSY     benign  . CATARACT EXTRACTION W/PHACO Left 03/31/2017   Procedure: CATARACT EXTRACTION PHACO AND INTRAOCULAR LENS PLACEMENT (IOC);  Surgeon: Rutherford Guys, MD;  Location: [facility];  Service: Ophthalmology;  Laterality: Left;  CDE: 8.67  . CATARACT EXTRACTION W/PHACO Right 04/14/2017   Procedure: CATARACT EXTRACTION PHACO AND INTRAOCULAR LENS PLACEMENT RIGHT EYE;  Surgeon: Rutherford Guys, MD;  Location: [facility];  Service: Ophthalmology;  Laterality: Right;  CDE: 8.52  . LIPOMA EXCISION    . Multiple dental extractions    . PORT-A-CATH REMOVAL Right 09/02/2017   Procedure: MINOR REMOVAL PORT-A-CATH;  Surgeon: Aviva Signs, MD;  Location: [facility];  Service: General;  Laterality: Right;  . TONSILLECTOMY AND ADENOIDECTOMY    . TOTAL KNEE ARTHROPLASTY  08/04/2012   Procedure: TOTAL KNEE ARTHROPLASTY;  Surgeon: Gearlean Alf, MD;  Location: [facility];  Service: Orthopedics;  Laterality: Left;    Family Psychiatric History: none  Family History:  Family History  Problem Relation Age of Onset  . Stroke Mother   . Heart failure Mother   . Hypertension Father   . Coronary artery disease Father     Social History:  Social History   Socioeconomic History  . Marital status: Single    Spouse name: Not on file  . Number of children: Not on file  . Years of education: Not on file  . Highest education level: Not on file   Occupational History  . Not on file  Social Needs  . Financial resource strain: Not on file  . Food insecurity:    Worry: Not on file    Inability: Not on file  . Transportation needs:    Medical: Not on file    Non-medical: Not on file  Tobacco Use  . Smoking status: Never Smoker  . Smokeless tobacco: Never Used  Substance and Sexual Activity  . Alcohol use: No    Comment: 07/10/2016 per pt no  . Drug use: No    Comment: 8/3/2017per pt no   . Sexual activity: Never  Lifestyle  . Physical activity:    Days per week: Not on file    Minutes per session: Not on file  . Stress: Not on file  Relationships  . Social connections:    Talks on phone: Not on file    Gets together: Not on file    Attends religious service: Not on file    Active member of club or organization: Not on file    Attends meetings of clubs or organizations: Not on file    Relationship status: Not on file  Other Topics Concern  . Not on file  Social History Narrative  . Not on file    Allergies:  Allergies  Allergen Reactions  . Morphine And Related Other (See Comments)    Hypotension  . Other     Preservative in latanoprost causes redness, pt uses preservative free latanoprost   . Paxil [Paroxetine Hcl]     unknown  . Latex Rash    Metabolic Disorder Labs: No results found for: HGBA1C, MPG No results found for: PROLACTIN No results found for: CHOL, TRIG, HDL, CHOLHDL, VLDL, LDLCALC No results found for: TSH  Therapeutic Level Labs: No results found for: LITHIUM No results found for: VALPROATE No components found for:  CBMZ  Current Medications: Current Outpatient Medications  Medication Sig Dispense Refill  . acetaminophen (TYLENOL 8 HOUR ARTHRITIS PAIN) 650 MG CR tablet Take 650 mg by mouth every 8 (eight) hours as needed for pain.    Marland Kitchen ALPRAZolam (XANAX) 0.25 MG tablet Take 1 tablet (0.25 mg total) by mouth daily as needed for anxiety. 30 tablet 2  . alum & mag hydroxide-simeth  (MAALOX/MYLANTA) 200-200-20 MG/5ML suspension Take 30 mLs by mouth every 6 (six) hours as needed for indigestion or heartburn.    Marland Kitchen aspirin 81 MG tablet Take 81 mg by mouth at bedtime.     Marland Kitchen atorvastatin (LIPITOR) 10 MG tablet Take 10 mg by mouth every evening.     . calcium carbonate (TUMS - DOSED IN MG ELEMENTAL CALCIUM) 500 MG chewable tablet Chew 1 tablet by mouth daily as needed for indigestion or heartburn.    . fentaNYL (DURAGESIC - DOSED MCG/HR) 75 MCG/HR APPLY ONE PATCH TO SKIN EVERY THREE DAYS 10 patch 0  . ibuprofen (ADVIL,MOTRIN) 200 MG tablet Take 200 mg by mouth 2 (two) times daily as needed for moderate pain.     Marland Kitchen lamoTRIgine (LAMICTAL) 25 MG tablet Take 1 tablet (25 mg total) by mouth at bedtime. 30 tablet 2  . latanoprost (XALATAN) 0.005 % ophthalmic solution Place 1 drop into both eyes at bedtime.    Marland Kitchen levothyroxine (SYNTHROID, LEVOTHROID) 88 MCG tablet Take 88 mcg by mouth daily before breakfast.     . lisinopril (PRINIVIL,ZESTRIL) 10 MG tablet Take 10 mg by mouth every morning.     . loratadine (CLARITIN) 10 MG tablet Take 10 mg by mouth daily as needed for allergies.    . metoprolol succinate (TOPROL-XL) 25 MG 24 hr tablet TAKE 1/2 TABLET BY MOUTH EVERY MORNING 15 tablet 0  . polyethylene glycol (MIRALAX / GLYCOLAX) packet Take 17 g by mouth daily as needed for mild constipation.     Marland Kitchen escitalopram (LEXAPRO) 10 MG tablet Take 1 tablet (10 mg total) by mouth daily. 30 tablet 2   No current facility-administered medications for this visit.      Musculoskeletal: Strength & Muscle Tone: decreased Gait & Station: broad based Patient leans: N/A  Psychiatric Specialty Exam: Review of Systems  Constitutional: Positive for malaise/fatigue.  Musculoskeletal: Positive for back pain.  Neurological: Positive for weakness.  Psychiatric/Behavioral: Positive for depression. The patient is nervous/anxious.   All other systems reviewed and are negative.   Blood pressure (!)  162/77, pulse 86, height 5\' 2"  (1.575 m), weight 197 lb (89.4 kg), SpO2 95 %.Body mass index is 36.03 kg/m.  General Appearance: Casual and Fairly Groomed  Eye Contact:  Good  Speech:  Clear and Coherent  Volume:  Normal  Mood:  Anxious and Dysphoric  Affect:  Constricted  Thought Process:  Goal Directed  Orientation:  Full (Time, Place, and Person)  Thought Content: Rumination   Suicidal Thoughts:  No  Homicidal Thoughts:  No  Memory:  Immediate;   Good Recent;   Good Remote;   Fair  Judgement:  Fair  Insight:  Fair  Psychomotor Activity:  Decreased and Tremor however tremor is much improved  Concentration:  Concentration: Poor and Attention Span: Poor  Recall:  Good  Fund of Knowledge: Good  Language: Good  Akathisia:  No  Handed:  Right  AIMS (if indicated): not done  Assets:  Communication Skills Desire for Improvement Resilience Social Support Talents/Skills  ADL's:  Intact  Cognition: WNL  Sleep:  Good   Screenings: GAD-7     Counselor from 09/15/2016 in [place] [facility]  Total GAD-7 Score  10       Assessment and Plan: Patient is a 77 year old female who has significant anxiety and some depression.  Much of this has to do with her inability to make a decision about returning to her prior residence.  I think when she makes a decision she is going to feel much better.  The medication trials include Zoloft Lexapro Paxil and Lamictal have not been particularly helpful in some ways seem to have worsened things.  Her sister also agrees that perhaps we need to try getting off some of these things so we will cut back Lexapro to 10 mg daily and Lamictal to 25 mg daily but continue the Xanax 0.25 mg daily.  If she feels worse with the decrease she can call at any time.  She will continue to see her counselor here and return to see me in 4 weeks   [name], MD 03/10/2018, 2:33 PM

## 2018-03-11 DIAGNOSIS — C541 Malignant neoplasm of endometrium: Secondary | ICD-10-CM | POA: Diagnosis not present

## 2018-03-22 ENCOUNTER — Other Ambulatory Visit (HOSPITAL_COMMUNITY): Payer: Self-pay | Admitting: Adult Health

## 2018-03-22 DIAGNOSIS — C541 Malignant neoplasm of endometrium: Secondary | ICD-10-CM

## 2018-03-23 ENCOUNTER — Encounter (HOSPITAL_COMMUNITY): Payer: Self-pay | Admitting: Adult Health

## 2018-03-23 NOTE — Progress Notes (Signed)
Received refill request from pharmacy for refill for Fentanyl patch.   [place] Controlled Substance Reporting System reviewed and refill is appropriate on or after 03/25/18. Medication e-scribed to her pharmacy [facility]) using [employer]'s 2-step verification process.    [place] reviewed:     Mike Craze, NP [place] [phone]

## 2018-04-02 ENCOUNTER — Ambulatory Visit (HOSPITAL_COMMUNITY): Payer: Self-pay | Admitting: Psychiatry

## 2018-04-19 ENCOUNTER — Encounter (HOSPITAL_COMMUNITY): Payer: Self-pay | Admitting: Psychiatry

## 2018-04-19 ENCOUNTER — Ambulatory Visit (INDEPENDENT_AMBULATORY_CARE_PROVIDER_SITE_OTHER): Payer: Medicare Other | Admitting: Psychiatry

## 2018-04-19 VITALS — BP 180/70 | HR 94 | Ht 62.0 in | Wt 203.0 lb

## 2018-04-19 DIAGNOSIS — F411 Generalized anxiety disorder: Secondary | ICD-10-CM

## 2018-04-19 DIAGNOSIS — F4010 Social phobia, unspecified: Secondary | ICD-10-CM

## 2018-04-19 DIAGNOSIS — M2550 Pain in unspecified joint: Secondary | ICD-10-CM | POA: Diagnosis not present

## 2018-04-19 DIAGNOSIS — F339 Major depressive disorder, recurrent, unspecified: Secondary | ICD-10-CM | POA: Diagnosis not present

## 2018-04-19 DIAGNOSIS — R450 Nervousness: Secondary | ICD-10-CM

## 2018-04-19 DIAGNOSIS — M549 Dorsalgia, unspecified: Secondary | ICD-10-CM

## 2018-04-19 MED ORDER — BUSPIRONE HCL 5 MG PO TABS: 5.0000 mg | ORAL_TABLET | Freq: Two times a day (BID) | ORAL | 2 refills | Status: DC

## 2018-04-19 MED ORDER — ESCITALOPRAM OXALATE 10 MG PO TABS: 10.0000 mg | ORAL_TABLET | Freq: Every day | ORAL | 2 refills | Status: DC

## 2018-04-19 MED ORDER — ALPRAZOLAM 0.25 MG PO TABS: 0.2500 mg | ORAL_TABLET | Freq: Two times a day (BID) | ORAL | 2 refills | Status: DC | PRN

## 2018-04-19 NOTE — Progress Notes (Signed)
BH MD/PA/NP OP Progress Note  04/19/2018 11:35 AM Yesenia Reynolds  MRN:  [id number]  Chief Complaint:  Chief Complaint    Depression; Anxiety; Follow-up     NOB:SJGG patient is a 77 year old single white female who livesIn a rest home. 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  The patient was referred by her oncologist for further treatment and assessment of severe anxiety.  The patient states that she has had no prior psychiatric assessment or treatment. She began to have endometrial bleeding in April 2016. She was found to have endometrial cancer and underwent hysterectomy. Following that she had 2 bouts of chemotherapy followed by radiation and eventually intravaginal radiation. She states that she finished all these treatments in October of 2016. She states that she did very well throughout the treatments and stayed calm and did not have significant problems with nausea and vomiting.  In November 2016 she began to have significant problems with anxiety. She didn't want anyone coming around and visiting her she felt extremely anxious being around people are going out to restaurants or to church. She is been a very gregarious person all her life and her personality totally changed. She denied being depressed sad or crying. She was shaking all the time and couldn't stay alone. She still can't stay alone. She stopped driving her car. She gave up things she enjoyed like going to her nephew's baseball games. She also could not watch anything frightening like police shows on TV because she would become extremely anxious. She is eating fairly well but lost a good deal of Reynolds during her treatments. She is sleeping well and denies nightmares. She states that she had short-term memory loss shortly after treatment but this is gotten much better.  She was tried on Paxil but it was not helpful. Her doctor has her on Xanax 0.25 mg which does help but she  takes it as needed. Her gynecologist put her on Lamictal and she is up to 150 mg a day. She thinks this is helped more than anything else because she is less anxious than she was and is slowly starting to drive a little bit and be around a few people at a time. Her hand still shake at times. She would like to get out and do all the things she used to do such as drive on her own go to baseball games enjoy her friends and church but she is beginning to realize that this is going to be a slow process. She denies suicidal ideation or auditory or visual hallucinations   The patient and her sister return after 6 weeks.  She states that she is not doing all that much better.  We are slowly getting her off Lamictal and she is less shaky.  She still takes the Lexapro 10 mg daily and Xanax 0.25 mg daily.  She try to go out with her sister for Mother's Day but "just could not."  She still having a lot of trouble making decisions and getting out of the rest home.  She realizes she has to go back to her condo at some point.  She is thinks that the anxiety is a little bit worse than the depression so I suggested we add just a low-dose of BuSpar to her regimen and she agrees.  We discussed at length some of the opportunities to get out in her area and she claims that she is going to  try these with either her sister or friend Visit Diagnosis:    ICD-10-CM   1. Generalized anxiety disorder F41.1 ALPRAZolam (XANAX) 0.25 MG tablet    Past Psychiatric History: none  Past Medical History:  Past Medical History:  Diagnosis Date  . Agoraphobia with panic attacks 08/07/2016  . Anxiety   . Arthritis   . Atrial fibrillation, currently in sinus rhythm   . Cancer [facility])    Endometrial  . Depression   . Endometrial cancer, FIGO stage IIIC ([place]) 08/08/2015  . Essential hypertension, benign   . GERD (gastroesophageal reflux disease)   . Hypothyroidism   . Mixed hyperlipidemia   . Palpitations   . Spinal stenosis   .  Spinal stenosis of lumbar region at multiple levels 08/07/2016  . Type 2 diabetes mellitus ([place])     Past Surgical History:  Procedure Laterality Date  . ABDOMINAL HYSTERECTOMY    . BREAST BIOPSY     benign  . CATARACT EXTRACTION W/PHACO Left 03/31/2017   Procedure: CATARACT EXTRACTION PHACO AND INTRAOCULAR LENS PLACEMENT (IOC);  Surgeon: Rutherford Guys, MD;  Location: [facility];  Service: Ophthalmology;  Laterality: Left;  CDE: 8.67  . CATARACT EXTRACTION W/PHACO Right 04/14/2017   Procedure: CATARACT EXTRACTION PHACO AND INTRAOCULAR LENS PLACEMENT RIGHT EYE;  Surgeon: Rutherford Guys, MD;  Location: [facility];  Service: Ophthalmology;  Laterality: Right;  CDE: 8.52  . LIPOMA EXCISION    . Multiple dental extractions    . PORT-A-CATH REMOVAL Right 09/02/2017   Procedure: MINOR REMOVAL PORT-A-CATH;  Surgeon: Aviva Signs, MD;  Location: [facility];  Service: General;  Laterality: Right;  . TONSILLECTOMY AND ADENOIDECTOMY    . TOTAL KNEE ARTHROPLASTY  08/04/2012   Procedure: TOTAL KNEE ARTHROPLASTY;  Surgeon: Gearlean Alf, MD;  Location: [facility];  Service: Orthopedics;  Laterality: Left;    Family Psychiatric History: None  Family History:  Family History  Problem Relation Age of Onset  . Stroke Mother   . Heart failure Mother   . Hypertension Father   . Coronary artery disease Father     Social History:  Social History   Socioeconomic History  . Marital status: Single    Spouse name: Not on file  . Number of children: Not on file  . Years of education: Not on file  . Highest education level: Not on file  Occupational History  . Not on file  Social Needs  . Financial resource strain: Not on file  . Food insecurity:    Worry: Not on file    Inability: Not on file  . Transportation needs:    Medical: Not on file    Non-medical: Not on file  Tobacco Use  . Smoking status: Never Smoker  . Smokeless tobacco: Never Used  Substance and Sexual Activity  . Alcohol use: No    Comment:  07/10/2016 per pt no  . Drug use: No    Comment: 8/3/2017per pt no   . Sexual activity: Never  Lifestyle  . Physical activity:    Days per week: Not on file    Minutes per session: Not on file  . Stress: Not on file  Relationships  . Social connections:    Talks on phone: Not on file    Gets together: Not on file    Attends religious service: Not on file    Active member of club or organization: Not on file    Attends meetings of clubs or organizations: Not on file  Relationship status: Not on file  Other Topics Concern  . Not on file  Social History Narrative  . Not on file    Allergies:  Allergies  Allergen Reactions  . Morphine And Related Other (See Comments)    Hypotension  . Other     Preservative in latanoprost causes redness, pt uses preservative free latanoprost   . Paxil [Paroxetine Hcl]     unknown  . Latex Rash    Metabolic Disorder Labs: No results found for: HGBA1C, MPG No results found for: PROLACTIN No results found for: CHOL, TRIG, HDL, CHOLHDL, VLDL, LDLCALC No results found for: TSH  Therapeutic Level Labs: No results found for: LITHIUM No results found for: VALPROATE No components found for:  CBMZ  Current Medications: Current Outpatient Medications  Medication Sig Dispense Refill  . acetaminophen (TYLENOL 8 HOUR ARTHRITIS PAIN) 650 MG CR tablet Take 650 mg by mouth every 8 (eight) hours as needed for pain.    Marland Kitchen ALPRAZolam (XANAX) 0.25 MG tablet Take 1 tablet (0.25 mg total) by mouth 2 (two) times daily as needed for anxiety. 60 tablet 2  . alum & mag hydroxide-simeth (MAALOX/MYLANTA) 200-200-20 MG/5ML suspension Take 30 mLs by mouth every 6 (six) hours as needed for indigestion or heartburn.    Marland Kitchen aspirin 81 MG tablet Take 81 mg by mouth at bedtime.     Marland Kitchen atorvastatin (LIPITOR) 10 MG tablet Take 10 mg by mouth every evening.     . calcium carbonate (TUMS - DOSED IN MG ELEMENTAL CALCIUM) 500 MG chewable tablet Chew 1 tablet by mouth daily as  needed for indigestion or heartburn.    . escitalopram (LEXAPRO) 10 MG tablet Take 1 tablet (10 mg total) by mouth daily. 30 tablet 2  . fentaNYL (DURAGESIC - DOSED MCG/HR) 75 MCG/HR apply ONE PATCH TO SKIN EVERY THREE DAYS 10 patch 0  . ibuprofen (ADVIL,MOTRIN) 200 MG tablet Take 200 mg by mouth 2 (two) times daily as needed for moderate pain.     Marland Kitchen latanoprost (XALATAN) 0.005 % ophthalmic solution Place 1 drop into both eyes at bedtime.    Marland Kitchen levothyroxine (SYNTHROID, LEVOTHROID) 88 MCG tablet Take 88 mcg by mouth daily before breakfast.     . lisinopril (PRINIVIL,ZESTRIL) 10 MG tablet Take 10 mg by mouth every morning.     . loratadine (CLARITIN) 10 MG tablet Take 10 mg by mouth daily as needed for allergies.    . metoprolol succinate (TOPROL-XL) 25 MG 24 hr tablet TAKE 1/2 TABLET BY MOUTH EVERY MORNING 15 tablet 0  . polyethylene glycol (MIRALAX / GLYCOLAX) packet Take 17 g by mouth daily as needed for mild constipation.     . busPIRone (BUSPAR) 5 MG tablet Take 1 tablet (5 mg total) by mouth 2 (two) times daily. 60 tablet 2   No current facility-administered medications for this visit.      Musculoskeletal: Strength & Muscle Tone: decreased Gait & Station: unsteady Patient leans: N/A  Psychiatric Specialty Exam: Review of Systems  Musculoskeletal: Positive for back pain and joint pain.  Psychiatric/Behavioral: Positive for depression. The patient is nervous/anxious.   All other systems reviewed and are negative.   Blood pressure (!) 180/70, pulse 94, height 5\' 2"  (1.575 m), Reynolds 203 lb (92.1 kg), SpO2 94 %.Body mass index is 37.13 kg/m.  General Appearance: Casual and Fairly Groomed  Eye Contact:  Fair  Speech:  Clear and Coherent  Volume:  Normal  Mood:  Anxious and Dysphoric  Affect:  Constricted  Thought Process:  Goal Directed  Orientation:  Full (Time, Place, and Person)  Thought Content: Rumination   Suicidal Thoughts:  No  Homicidal Thoughts:  No  Memory:   Immediate;   Good Recent;   Good Remote;   Fair  Judgement:  Fair  Insight:  Fair  Psychomotor Activity:  Decreased  Concentration:  Concentration: Fair and Attention Span: Fair  Recall:  Good  Fund of Knowledge: Good  Language: Good  Akathisia:  No  Handed:  Right  AIMS (if indicated): not done  Assets:  Communication Skills Desire for Improvement Resilience Social Support Talents/Skills  ADL's:  Intact  Cognition: WNL  Sleep:  Good   Screenings: GAD-7     Counselor from 09/15/2016 in [place] [facility]-  Total GAD-7 Score  10       Assessment and Plan: This patient is a 77 year old female with a history of depression but primarily significant anxiety particularly social phobia.  Her sister and I have encouraged her to practice a little bit each day with getting out around people.  We will add BuSpar 5 mg twice daily to help with anxiety.  She will continue Xanax and may increase the dosage to 0.25 mg twice a day as needed.  She will discontinue Lamictal and continue Lexapro 10 mg daily.  She will return to see me in 6 weeks and will continue her counseling here   [name], MD 04/19/2018, 11:35 AM

## 2018-04-20 ENCOUNTER — Ambulatory Visit (INDEPENDENT_AMBULATORY_CARE_PROVIDER_SITE_OTHER): Payer: Medicare Other | Admitting: Psychiatry

## 2018-04-20 ENCOUNTER — Encounter (HOSPITAL_COMMUNITY): Payer: Self-pay | Admitting: Psychiatry

## 2018-04-20 DIAGNOSIS — F411 Generalized anxiety disorder: Secondary | ICD-10-CM | POA: Diagnosis not present

## 2018-04-20 NOTE — Progress Notes (Signed)
  Patient:  Yesenia Reynolds   DOB: 09/19/1941  MR Number: [id number]  Location: [place]:  [address] [place],  [place], [address]  Start:   Tuesday 04/20/2018 10:10 AM End: Tuesday 04/20/2018 11:00 AM     Provider/Observer:     Maurice Small, MSW, LCSW     Chief Complaint:               Anxiety, stress          Reason For Service:     MEREDITH KILBRIDE is a 77 year old female who presents with symptoms of anxiety that began after she had treatment for cancer in 2016. Prior to treatment, patient reports being very independent and outgoing. After treatment, she became fearful of staying alone, driving, and being away from home. She reports excessive worry and says she developed shakiness. She also reports nervousness and decreased so  cial involvement.  Interventions Strategy:  Supportive/ACT       Participation Level:   Active  Participation Quality:  Appropriate      Behavioral Observation:  Casual, Alert, anxious,  Current Psychosocial Factors: Patient resides in a senior living community, Conflict with housemate   Content of Session:   reviewed symptoms, administered PHQ-9 and GAD-7, facilitated expression of thoughts and feelings, assisted patient identify triggers of anxiety and worry, assisted patient identify ways to increase behavioral activation regarding researching information related to moving home,assigned patient to contact [employer] [facility] and [facility], reviewed treatment plan      Current Status:   anxiety, worry, depressed mood, irritability,eecreased involvement in activities  Suicidal/Homicidal:    No          Patient Progress:    Patient last was seen in March 2019. She reports having 2  3-4 day visits at home with her roommate since last session. She says visits were okay but reports increased anxiety about possibly returning to her home. She reports being unable to afford her current placement and eventually having to move  back home. She fears roommate will be negative and bossy. She also is concerned about her needs regarding meals being met. She also expresses concern about roommates aide providing services for her and patient. Patient thought patterns reflect what if statements and anxiety.   Target Goals:   1. Learn and implement coping strategies to reduce/manage anxiety.     2. Identify/challenge/and replace negative anxiety provoking self talk with realistic healthy alternatives ,    3, Identify relapse prevention strategies for managing possible future anxiety symptoms  Last Reviewed:   10/22/2017  Goals Addressed Today:    1,2,3  Plan                  Return again in   3-4 weeks.  Impression/Diagnosis:  Patient presents with symptoms of anxiety that began after she had treatment for cancer in 2016. She reports excessive worry, motor tension, and social withdrawal.  She worries about a variety of issues.      Diagnosis:  Axis I: Generalized anxiety disorder          Axis II: Deferred   Taveon Enyeart, LCSW 04/20/2018

## 2018-04-22 ENCOUNTER — Other Ambulatory Visit (HOSPITAL_COMMUNITY): Payer: Self-pay | Admitting: Adult Health

## 2018-04-22 ENCOUNTER — Other Ambulatory Visit (HOSPITAL_COMMUNITY): Payer: Self-pay | Admitting: Hematology

## 2018-04-22 DIAGNOSIS — C541 Malignant neoplasm of endometrium: Secondary | ICD-10-CM

## 2018-04-22 MED ORDER — FENTANYL 75 MCG/HR TD PT72: MEDICATED_PATCH | TRANSDERMAL | 0 refills | Status: DC

## 2018-04-22 NOTE — Telephone Encounter (Signed)
Yesenia Reynolds,   Please help me have Dr. Raliegh Ip refill her Fentanyl patches so subsequent refill requests will come to him after I leave [facility].    Thanks! Mike Craze, NP [place] [phone]

## 2018-04-28 DIAGNOSIS — R922 Inconclusive mammogram: Secondary | ICD-10-CM | POA: Diagnosis not present

## 2018-04-28 DIAGNOSIS — R921 Mammographic calcification found on diagnostic imaging of breast: Secondary | ICD-10-CM | POA: Diagnosis not present

## 2018-05-24 ENCOUNTER — Other Ambulatory Visit (HOSPITAL_COMMUNITY): Payer: Self-pay | Admitting: Hematology

## 2018-05-24 DIAGNOSIS — C541 Malignant neoplasm of endometrium: Secondary | ICD-10-CM

## 2018-05-24 MED ORDER — FENTANYL 75 MCG/HR TD PT72: MEDICATED_PATCH | TRANSDERMAL | 0 refills | Status: DC

## 2018-06-03 ENCOUNTER — Other Ambulatory Visit: Payer: Self-pay

## 2018-06-03 ENCOUNTER — Encounter (HOSPITAL_COMMUNITY): Payer: Self-pay | Admitting: Emergency Medicine

## 2018-06-03 ENCOUNTER — Emergency Department (HOSPITAL_COMMUNITY)
Admission: EM | Admit: 2018-06-03 | Discharge: 2018-06-03 | Disposition: A | Discharge status: Home / Self Care | Payer: Medicare Other | Attending: Emergency Medicine | Admitting: Emergency Medicine

## 2018-06-03 DIAGNOSIS — F4001 Agoraphobia with panic disorder: Secondary | ICD-10-CM | POA: Diagnosis not present

## 2018-06-03 DIAGNOSIS — Z7982 Long term (current) use of aspirin: Secondary | ICD-10-CM | POA: Insufficient documentation

## 2018-06-03 DIAGNOSIS — I10 Essential (primary) hypertension: Secondary | ICD-10-CM | POA: Insufficient documentation

## 2018-06-03 DIAGNOSIS — F419 Anxiety disorder, unspecified: Secondary | ICD-10-CM | POA: Insufficient documentation

## 2018-06-03 DIAGNOSIS — Z79899 Other long term (current) drug therapy: Secondary | ICD-10-CM | POA: Diagnosis not present

## 2018-06-03 DIAGNOSIS — F329 Major depressive disorder, single episode, unspecified: Secondary | ICD-10-CM | POA: Diagnosis present

## 2018-06-03 DIAGNOSIS — E039 Hypothyroidism, unspecified: Secondary | ICD-10-CM | POA: Diagnosis not present

## 2018-06-03 NOTE — ED Provider Notes (Signed)
[facility] EMERGENCY DEPARTMENT Provider Note   CSN: [id number] Arrival date & time: 06/03/18  1638     History   Chief Complaint Chief Complaint  Patient presents with  . Depression    HPI Yesenia Reynolds is a 77 y.o. female.  HPI   76yF with anxiety/depression. Ongoing for several years around time of treatment for endometrial cancer although she has the understanding her long term prognosis is very good.  Increasingly isolated and withdrawn. Has been seeing counselor and psychiatrist but feels like she continues to spiral out of control. She feels depressed but denies SI or HI. She reports compliance with her medications. Her sister is with her today and is also very upset/frustrated herself to the point of tears.  Past Medical History:  Diagnosis Date  . Agoraphobia with panic attacks 08/07/2016  . Anxiety   . Arthritis   . Atrial fibrillation, currently in sinus rhythm   . Cancer [facility] [place])    Endometrial  . Depression   . Endometrial cancer, FIGO stage IIIC ([place]) 08/08/2015  . Essential hypertension, benign   . GERD (gastroesophageal reflux disease)   . Hypothyroidism   . Mixed hyperlipidemia   . Palpitations   . Spinal stenosis   . Spinal stenosis of lumbar region at multiple levels 08/07/2016  . Type 2 diabetes mellitus [facility])     Patient Active Problem List   Diagnosis Date Noted  . Primary osteoarthritis of left ankle 10/16/2017  . Primary open angle glaucoma of both eyes, mild stage 02/10/2017  . Agoraphobia with panic attacks 08/07/2016  . Spinal stenosis of lumbar region at multiple levels 08/07/2016  . Generalized anxiety disorder 05/29/2016  . Tick bite of neck 05/06/2016  . Anxiety and depression 01/01/2016  . Hypomagnesemia 01/01/2016  . Port-A-Cath in place 12/04/2015  . Antineoplastic chemotherapy induced anemia 11/21/2015  . Neoplastic malignant related fatigue 11/21/2015  . Reactive depression 11/21/2015  . Anemia due to multiple mechanisms  10/15/2015  . Neuropathy 10/15/2015  . 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 Claustrophobia 06/18/2015  . Leg swelling 05/14/2015  . Bilateral sciatica 04/23/2015  . Functional constipation 04/23/2015  . Benign essential hypertension 07/05/2012  . Mixed hyperlipidemia 07/05/2012  . Palpitations 07/05/2012  . Carotid artery disease ([place]) 07/05/2012    Past Surgical History:  Procedure Laterality Date  . ABDOMINAL HYSTERECTOMY    . BREAST BIOPSY     benign  . CATARACT EXTRACTION W/PHACO Left 03/31/2017   Procedure: CATARACT EXTRACTION PHACO AND INTRAOCULAR LENS PLACEMENT (IOC);  Surgeon: Rutherford Guys, MD;  Location: [facility];  Service: Ophthalmology;  Laterality: Left;  CDE: 8.67  . CATARACT EXTRACTION W/PHACO Right 04/14/2017   Procedure: CATARACT EXTRACTION PHACO AND INTRAOCULAR LENS PLACEMENT RIGHT EYE;  Surgeon: Rutherford Guys, MD;  Location: [facility];  Service: Ophthalmology;  Laterality: Right;  CDE: 8.52  . LIPOMA EXCISION    . Multiple dental extractions    . PORT-A-CATH REMOVAL Right 09/02/2017   Procedure: MINOR REMOVAL PORT-A-CATH;  Surgeon: Aviva Signs, MD;  Location: [facility];  Service: General;  Laterality: Right;  . TONSILLECTOMY AND ADENOIDECTOMY    . TOTAL KNEE ARTHROPLASTY  08/04/2012   Procedure: TOTAL KNEE ARTHROPLASTY;  Surgeon: Gearlean Alf, MD;  Location: [facility];  Service: Orthopedics;  Laterality: Left;     OB History   None      Home Medications    Prior to Admission medications   Medication Sig Start Date End Date Taking? Authorizing Provider  acetaminophen (TYLENOL  8 HOUR ARTHRITIS PAIN) 650 MG CR tablet Take 650 mg by mouth every 8 (eight) hours as needed for pain.    [provider]  ALPRAZolam Duanne Moron) 0.25 MG tablet Take 1 tablet (0.25 mg total) by mouth 2 (two) times daily as needed for anxiety. 04/19/18 04/19/19  Cloria Spring, MD  alum & mag hydroxide-simeth (MAALOX/MYLANTA) 200-200-20 MG/5ML suspension Take 30 mLs by mouth every 6 (six)  hours as needed for indigestion or heartburn.    [provider]  aspirin 81 MG tablet Take 81 mg by mouth at bedtime.     [provider]  atorvastatin (LIPITOR) 10 MG tablet Take 10 mg by mouth every evening.     [provider]  busPIRone (BUSPAR) 5 MG tablet Take 1 tablet (5 mg total) by mouth 2 (two) times daily. 04/19/18   Cloria Spring, MD  calcium carbonate (TUMS - DOSED IN MG ELEMENTAL CALCIUM) 500 MG chewable tablet Chew 1 tablet by mouth daily as needed for indigestion or heartburn.    [provider]  escitalopram (LEXAPRO) 10 MG tablet Take 1 tablet (10 mg total) by mouth daily. 04/19/18 04/19/19  Cloria Spring, MD  fentaNYL (DURAGESIC - DOSED MCG/HR) 75 MCG/HR apply ONE PATCH TO SKIN EVERY THREE DAYS 05/24/18   Derek Jack, MD  ibuprofen (ADVIL,MOTRIN) 200 MG tablet Take 200 mg by mouth 2 (two) times daily as needed for moderate pain.     [provider]  latanoprost (XALATAN) 0.005 % ophthalmic solution Place 1 drop into both eyes at bedtime. 01/12/15   [provider]  levothyroxine (SYNTHROID, LEVOTHROID) 88 MCG tablet Take 88 mcg by mouth daily before breakfast.     [provider]  lisinopril (PRINIVIL,ZESTRIL) 10 MG tablet Take 10 mg by mouth every morning.     [provider]  loratadine (CLARITIN) 10 MG tablet Take 10 mg by mouth daily as needed for allergies.    [provider]  metoprolol succinate (TOPROL-XL) 25 MG 24 hr tablet TAKE 1/2 TABLET BY MOUTH EVERY MORNING 08/03/17   Satira Sark, MD  polyethylene glycol [facility] / Floria Raveling) packet Take 17 g by mouth daily as needed for mild constipation.     [provider]    Family History Family History  Problem Relation Age of Onset  . Stroke Mother   . Heart failure Mother   . Hypertension Father   . Coronary artery disease Father     Social History Social History   Tobacco Use  . Smoking status: Never Smoker  .  Smokeless tobacco: Never Used  Substance Use Topics  . Alcohol use: No    Comment: 07/10/2016 per pt no  . Drug use: No    Comment: 8/3/2017per pt no      Allergies   Morphine and related; Other; Paxil [paroxetine hcl]; and Latex   Review of Systems Review of Systems  All systems reviewed and negative, other than as noted in HPI.  Physical Exam Updated Vital Signs BP (!) 165/75 (BP Location: Right Arm)   Pulse 92   Temp 99.6 F (37.6 C) (Oral)   Resp 20   Ht 5\' 1"  (1.549 m)   Wt 92.1 kg (203 lb)   SpO2 96%   BMI 38.36 kg/m   Physical Exam  Constitutional: She appears well-developed and well-nourished. No distress.  HENT:  Head: Normocephalic and atraumatic.  Eyes: Conjunctivae are normal. Right eye exhibits no discharge. Left eye exhibits no discharge.  Neck: Neck supple.  Pulmonary/Chest: Effort normal.  Neurological: She is alert.  Skin: Skin is dry.  Psychiatric:  Flat affect. Poor eye contact. Tries to defer many questions to her sister or her sister will simply start to answer questions for her.   Nursing note and vitals reviewed.    ED Treatments / Results  Labs (all labs ordered are listed, but only abnormal results are displayed) Labs Reviewed - No data to display  EKG None  Radiology No results found.  Procedures Procedures (including critical care time)  Medications Ordered in ED Medications - No data to display   Initial Impression / Assessment and Plan / ED Course  I have reviewed the triage vital signs and the nursing notes.  Pertinent labs & imaging results that were available during my care of the patient were reviewed by me and considered in my medical decision making (see chart for details).     76yF with anxiety. She has no SI or HI. She is not psychotic.   Ms Minner and her sister strike me as having significant issues with codependency. I am not qualified to adequately address this and the ER in general is not the appropriate  place for the help she needs. Encouraged her to follow-up with her counselor or psychiatrist and I think her sister may benefit from speaking with someone as well.   Final Clinical Impressions(s) / ED Diagnoses   Final diagnoses:  Anxiety    ED Discharge Orders    None       Virgel Manifold, MD 06/06/18 0003

## 2018-06-03 NOTE — Discharge Instructions (Addendum)
I am not a psychiatrist or a Social worker. I think you and your sister have significant issues with co-dependency though. I would encourage you to at least read about this. Your sister may benefit from counseling as well or possibly group sessions together. Unfortunately the ER is just not the appropriate place to address issues like this.

## 2018-06-03 NOTE — ED Triage Notes (Signed)
Pt states she is "feeling down and I cant stand anything" pt's meds were changed in May and states to decline then.   Pt could not get an appt with psychiatrist til middle of July.  Pt Denies SI/HI

## 2018-06-04 ENCOUNTER — Telehealth (HOSPITAL_COMMUNITY): Payer: Self-pay | Admitting: *Deleted

## 2018-06-04 NOTE — Telephone Encounter (Signed)
Discussed with Ms. Yesenia Reynolds, RMA for clarification. The home health nurse reports the patient is "in crisis," meaning that the patient endorses anxiety. No SI, HI. Patient visited ED last night, and no labs taken. TSH not available in Epic; may consider check it if it is not recently done. Would advise uptitration of buspar, and has sooner appointment with Dr. Harrington Challenger. - Increase buspar 5 mg three times a day - Schedule sooner appointment with Dr. Harrington Challenger

## 2018-06-04 NOTE — Telephone Encounter (Signed)
Dr Modesta Messing Dr Harrington Challenger patient went to the ER @ [facility] on yesterday  06/03/18. The home health nurse called  stating feels as though she's in crisis " she states she feels as though she is about to burst" (patients words). Patient stated ER visit was a useless due to the fact person seeing her told her that there wasn't anything they could  do. Patient said on  Last visit Lamictal was d/c'd & she started on Buspar. She continues taking the Xanax as prescribed.

## 2018-06-07 ENCOUNTER — Ambulatory Visit (HOSPITAL_COMMUNITY): Payer: Medicare Other | Admitting: Psychiatry

## 2018-06-07 DIAGNOSIS — F410 Panic disorder [episodic paroxysmal anxiety] without agoraphobia: Secondary | ICD-10-CM | POA: Diagnosis not present

## 2018-06-07 DIAGNOSIS — G629 Polyneuropathy, unspecified: Secondary | ICD-10-CM | POA: Diagnosis not present

## 2018-06-07 DIAGNOSIS — F329 Major depressive disorder, single episode, unspecified: Secondary | ICD-10-CM | POA: Diagnosis not present

## 2018-06-07 DIAGNOSIS — R4589 Other symptoms and signs involving emotional state: Secondary | ICD-10-CM | POA: Diagnosis not present

## 2018-06-07 DIAGNOSIS — R9389 Abnormal findings on diagnostic imaging of other specified body structures: Secondary | ICD-10-CM | POA: Diagnosis not present

## 2018-06-07 DIAGNOSIS — F332 Major depressive disorder, recurrent severe without psychotic features: Secondary | ICD-10-CM | POA: Diagnosis not present

## 2018-06-07 DIAGNOSIS — J189 Pneumonia, unspecified organism: Secondary | ICD-10-CM | POA: Diagnosis not present

## 2018-06-08 DIAGNOSIS — G629 Polyneuropathy, unspecified: Secondary | ICD-10-CM | POA: Diagnosis not present

## 2018-06-08 DIAGNOSIS — F410 Panic disorder [episodic paroxysmal anxiety] without agoraphobia: Secondary | ICD-10-CM | POA: Diagnosis not present

## 2018-06-08 DIAGNOSIS — F332 Major depressive disorder, recurrent severe without psychotic features: Secondary | ICD-10-CM | POA: Diagnosis not present

## 2018-06-09 DIAGNOSIS — F332 Major depressive disorder, recurrent severe without psychotic features: Secondary | ICD-10-CM | POA: Diagnosis not present

## 2018-06-09 DIAGNOSIS — I10 Essential (primary) hypertension: Secondary | ICD-10-CM | POA: Diagnosis not present

## 2018-06-09 DIAGNOSIS — K219 Gastro-esophageal reflux disease without esophagitis: Secondary | ICD-10-CM | POA: Diagnosis not present

## 2018-06-09 DIAGNOSIS — E785 Hyperlipidemia, unspecified: Secondary | ICD-10-CM | POA: Diagnosis not present

## 2018-06-09 DIAGNOSIS — E118 Type 2 diabetes mellitus with unspecified complications: Secondary | ICD-10-CM | POA: Diagnosis not present

## 2018-06-09 DIAGNOSIS — I480 Paroxysmal atrial fibrillation: Secondary | ICD-10-CM | POA: Diagnosis not present

## 2018-06-09 DIAGNOSIS — E039 Hypothyroidism, unspecified: Secondary | ICD-10-CM | POA: Diagnosis not present

## 2018-06-09 DIAGNOSIS — G894 Chronic pain syndrome: Secondary | ICD-10-CM | POA: Diagnosis not present

## 2018-06-09 DIAGNOSIS — Z8542 Personal history of malignant neoplasm of other parts of uterus: Secondary | ICD-10-CM | POA: Diagnosis not present

## 2018-06-09 DIAGNOSIS — M48061 Spinal stenosis, lumbar region without neurogenic claudication: Secondary | ICD-10-CM | POA: Diagnosis not present

## 2018-06-10 DIAGNOSIS — E039 Hypothyroidism, unspecified: Secondary | ICD-10-CM | POA: Diagnosis not present

## 2018-06-10 DIAGNOSIS — Z8542 Personal history of malignant neoplasm of other parts of uterus: Secondary | ICD-10-CM | POA: Diagnosis not present

## 2018-06-10 DIAGNOSIS — E118 Type 2 diabetes mellitus with unspecified complications: Secondary | ICD-10-CM | POA: Diagnosis not present

## 2018-06-10 DIAGNOSIS — I480 Paroxysmal atrial fibrillation: Secondary | ICD-10-CM | POA: Diagnosis not present

## 2018-06-10 DIAGNOSIS — G894 Chronic pain syndrome: Secondary | ICD-10-CM | POA: Diagnosis not present

## 2018-06-10 DIAGNOSIS — I10 Essential (primary) hypertension: Secondary | ICD-10-CM | POA: Diagnosis not present

## 2018-06-10 DIAGNOSIS — M48061 Spinal stenosis, lumbar region without neurogenic claudication: Secondary | ICD-10-CM | POA: Diagnosis not present

## 2018-06-10 DIAGNOSIS — F332 Major depressive disorder, recurrent severe without psychotic features: Secondary | ICD-10-CM | POA: Diagnosis not present

## 2018-06-10 DIAGNOSIS — K219 Gastro-esophageal reflux disease without esophagitis: Secondary | ICD-10-CM | POA: Diagnosis not present

## 2018-06-10 DIAGNOSIS — E785 Hyperlipidemia, unspecified: Secondary | ICD-10-CM | POA: Diagnosis not present

## 2018-06-11 DIAGNOSIS — F332 Major depressive disorder, recurrent severe without psychotic features: Secondary | ICD-10-CM | POA: Diagnosis not present

## 2018-06-12 DIAGNOSIS — K219 Gastro-esophageal reflux disease without esophagitis: Secondary | ICD-10-CM | POA: Diagnosis not present

## 2018-06-12 DIAGNOSIS — E785 Hyperlipidemia, unspecified: Secondary | ICD-10-CM | POA: Diagnosis not present

## 2018-06-12 DIAGNOSIS — E118 Type 2 diabetes mellitus with unspecified complications: Secondary | ICD-10-CM | POA: Diagnosis not present

## 2018-06-12 DIAGNOSIS — I480 Paroxysmal atrial fibrillation: Secondary | ICD-10-CM | POA: Diagnosis not present

## 2018-06-12 DIAGNOSIS — E538 Deficiency of other specified B group vitamins: Secondary | ICD-10-CM | POA: Diagnosis not present

## 2018-06-12 DIAGNOSIS — I10 Essential (primary) hypertension: Secondary | ICD-10-CM | POA: Diagnosis not present

## 2018-06-12 DIAGNOSIS — F332 Major depressive disorder, recurrent severe without psychotic features: Secondary | ICD-10-CM | POA: Diagnosis not present

## 2018-06-12 DIAGNOSIS — M48061 Spinal stenosis, lumbar region without neurogenic claudication: Secondary | ICD-10-CM | POA: Diagnosis not present

## 2018-06-12 DIAGNOSIS — E559 Vitamin D deficiency, unspecified: Secondary | ICD-10-CM | POA: Diagnosis not present

## 2018-06-12 DIAGNOSIS — E039 Hypothyroidism, unspecified: Secondary | ICD-10-CM | POA: Diagnosis not present

## 2018-06-12 DIAGNOSIS — Z8542 Personal history of malignant neoplasm of other parts of uterus: Secondary | ICD-10-CM | POA: Diagnosis not present

## 2018-06-13 DIAGNOSIS — E538 Deficiency of other specified B group vitamins: Secondary | ICD-10-CM | POA: Diagnosis not present

## 2018-06-13 DIAGNOSIS — M48061 Spinal stenosis, lumbar region without neurogenic claudication: Secondary | ICD-10-CM | POA: Diagnosis not present

## 2018-06-13 DIAGNOSIS — E559 Vitamin D deficiency, unspecified: Secondary | ICD-10-CM | POA: Diagnosis not present

## 2018-06-13 DIAGNOSIS — E118 Type 2 diabetes mellitus with unspecified complications: Secondary | ICD-10-CM | POA: Diagnosis not present

## 2018-06-13 DIAGNOSIS — I10 Essential (primary) hypertension: Secondary | ICD-10-CM | POA: Diagnosis not present

## 2018-06-13 DIAGNOSIS — Z8542 Personal history of malignant neoplasm of other parts of uterus: Secondary | ICD-10-CM | POA: Diagnosis not present

## 2018-06-13 DIAGNOSIS — E785 Hyperlipidemia, unspecified: Secondary | ICD-10-CM | POA: Diagnosis not present

## 2018-06-13 DIAGNOSIS — I480 Paroxysmal atrial fibrillation: Secondary | ICD-10-CM | POA: Diagnosis not present

## 2018-06-13 DIAGNOSIS — F332 Major depressive disorder, recurrent severe without psychotic features: Secondary | ICD-10-CM | POA: Diagnosis not present

## 2018-06-13 DIAGNOSIS — E039 Hypothyroidism, unspecified: Secondary | ICD-10-CM | POA: Diagnosis not present

## 2018-06-13 DIAGNOSIS — K219 Gastro-esophageal reflux disease without esophagitis: Secondary | ICD-10-CM | POA: Diagnosis not present

## 2018-06-14 ENCOUNTER — Ambulatory Visit (HOSPITAL_COMMUNITY): Payer: Medicare Other | Admitting: Psychiatry

## 2018-06-14 DIAGNOSIS — E1159 Type 2 diabetes mellitus with other circulatory complications: Secondary | ICD-10-CM | POA: Diagnosis not present

## 2018-06-14 DIAGNOSIS — F332 Major depressive disorder, recurrent severe without psychotic features: Secondary | ICD-10-CM | POA: Diagnosis not present

## 2018-06-14 DIAGNOSIS — E118 Type 2 diabetes mellitus with unspecified complications: Secondary | ICD-10-CM | POA: Diagnosis not present

## 2018-06-14 DIAGNOSIS — E538 Deficiency of other specified B group vitamins: Secondary | ICD-10-CM | POA: Diagnosis not present

## 2018-06-14 DIAGNOSIS — I480 Paroxysmal atrial fibrillation: Secondary | ICD-10-CM | POA: Diagnosis not present

## 2018-06-14 DIAGNOSIS — I10 Essential (primary) hypertension: Secondary | ICD-10-CM | POA: Diagnosis not present

## 2018-06-15 ENCOUNTER — Ambulatory Visit (HOSPITAL_COMMUNITY): Payer: Medicare Other | Admitting: Psychiatry

## 2018-06-15 DIAGNOSIS — F332 Major depressive disorder, recurrent severe without psychotic features: Secondary | ICD-10-CM | POA: Diagnosis not present

## 2018-06-16 DIAGNOSIS — E118 Type 2 diabetes mellitus with unspecified complications: Secondary | ICD-10-CM | POA: Diagnosis not present

## 2018-06-16 DIAGNOSIS — F332 Major depressive disorder, recurrent severe without psychotic features: Secondary | ICD-10-CM | POA: Diagnosis not present

## 2018-06-16 DIAGNOSIS — J029 Acute pharyngitis, unspecified: Secondary | ICD-10-CM | POA: Diagnosis not present

## 2018-06-16 DIAGNOSIS — E1159 Type 2 diabetes mellitus with other circulatory complications: Secondary | ICD-10-CM | POA: Diagnosis not present

## 2018-06-16 DIAGNOSIS — I10 Essential (primary) hypertension: Secondary | ICD-10-CM | POA: Diagnosis not present

## 2018-06-16 DIAGNOSIS — I480 Paroxysmal atrial fibrillation: Secondary | ICD-10-CM | POA: Diagnosis not present

## 2018-06-17 DIAGNOSIS — I480 Paroxysmal atrial fibrillation: Secondary | ICD-10-CM | POA: Diagnosis not present

## 2018-06-17 DIAGNOSIS — K219 Gastro-esophageal reflux disease without esophagitis: Secondary | ICD-10-CM | POA: Diagnosis not present

## 2018-06-17 DIAGNOSIS — E118 Type 2 diabetes mellitus with unspecified complications: Secondary | ICD-10-CM | POA: Diagnosis not present

## 2018-06-17 DIAGNOSIS — E785 Hyperlipidemia, unspecified: Secondary | ICD-10-CM | POA: Diagnosis not present

## 2018-06-17 DIAGNOSIS — J029 Acute pharyngitis, unspecified: Secondary | ICD-10-CM | POA: Diagnosis not present

## 2018-06-17 DIAGNOSIS — I10 Essential (primary) hypertension: Secondary | ICD-10-CM | POA: Diagnosis not present

## 2018-06-17 DIAGNOSIS — F332 Major depressive disorder, recurrent severe without psychotic features: Secondary | ICD-10-CM | POA: Diagnosis not present

## 2018-06-18 DIAGNOSIS — F332 Major depressive disorder, recurrent severe without psychotic features: Secondary | ICD-10-CM | POA: Diagnosis not present

## 2018-06-19 DIAGNOSIS — F332 Major depressive disorder, recurrent severe without psychotic features: Secondary | ICD-10-CM | POA: Diagnosis not present

## 2018-06-19 DIAGNOSIS — E118 Type 2 diabetes mellitus with unspecified complications: Secondary | ICD-10-CM | POA: Diagnosis not present

## 2018-06-19 DIAGNOSIS — E1159 Type 2 diabetes mellitus with other circulatory complications: Secondary | ICD-10-CM | POA: Diagnosis not present

## 2018-06-19 DIAGNOSIS — J029 Acute pharyngitis, unspecified: Secondary | ICD-10-CM | POA: Diagnosis not present

## 2018-06-19 DIAGNOSIS — I10 Essential (primary) hypertension: Secondary | ICD-10-CM | POA: Diagnosis not present

## 2018-06-20 DIAGNOSIS — F332 Major depressive disorder, recurrent severe without psychotic features: Secondary | ICD-10-CM | POA: Diagnosis not present

## 2018-06-21 DIAGNOSIS — E039 Hypothyroidism, unspecified: Secondary | ICD-10-CM | POA: Diagnosis not present

## 2018-06-21 DIAGNOSIS — E538 Deficiency of other specified B group vitamins: Secondary | ICD-10-CM | POA: Diagnosis not present

## 2018-06-21 DIAGNOSIS — I10 Essential (primary) hypertension: Secondary | ICD-10-CM | POA: Diagnosis not present

## 2018-06-21 DIAGNOSIS — F329 Major depressive disorder, single episode, unspecified: Secondary | ICD-10-CM | POA: Diagnosis not present

## 2018-06-21 DIAGNOSIS — I480 Paroxysmal atrial fibrillation: Secondary | ICD-10-CM | POA: Diagnosis not present

## 2018-06-21 DIAGNOSIS — Z8542 Personal history of malignant neoplasm of other parts of uterus: Secondary | ICD-10-CM | POA: Diagnosis not present

## 2018-06-21 DIAGNOSIS — E118 Type 2 diabetes mellitus with unspecified complications: Secondary | ICD-10-CM | POA: Diagnosis not present

## 2018-06-21 DIAGNOSIS — K219 Gastro-esophageal reflux disease without esophagitis: Secondary | ICD-10-CM | POA: Diagnosis not present

## 2018-06-21 DIAGNOSIS — M48061 Spinal stenosis, lumbar region without neurogenic claudication: Secondary | ICD-10-CM | POA: Diagnosis not present

## 2018-06-21 DIAGNOSIS — E559 Vitamin D deficiency, unspecified: Secondary | ICD-10-CM | POA: Diagnosis not present

## 2018-06-21 DIAGNOSIS — E785 Hyperlipidemia, unspecified: Secondary | ICD-10-CM | POA: Diagnosis not present

## 2018-06-21 DIAGNOSIS — F332 Major depressive disorder, recurrent severe without psychotic features: Secondary | ICD-10-CM | POA: Diagnosis not present

## 2018-06-22 DIAGNOSIS — I480 Paroxysmal atrial fibrillation: Secondary | ICD-10-CM | POA: Diagnosis not present

## 2018-06-22 DIAGNOSIS — E118 Type 2 diabetes mellitus with unspecified complications: Secondary | ICD-10-CM | POA: Diagnosis not present

## 2018-06-22 DIAGNOSIS — Z8542 Personal history of malignant neoplasm of other parts of uterus: Secondary | ICD-10-CM | POA: Diagnosis not present

## 2018-06-22 DIAGNOSIS — E559 Vitamin D deficiency, unspecified: Secondary | ICD-10-CM | POA: Diagnosis not present

## 2018-06-22 DIAGNOSIS — I10 Essential (primary) hypertension: Secondary | ICD-10-CM | POA: Diagnosis not present

## 2018-06-22 DIAGNOSIS — K219 Gastro-esophageal reflux disease without esophagitis: Secondary | ICD-10-CM | POA: Diagnosis not present

## 2018-06-22 DIAGNOSIS — F332 Major depressive disorder, recurrent severe without psychotic features: Secondary | ICD-10-CM | POA: Diagnosis not present

## 2018-06-22 DIAGNOSIS — M48061 Spinal stenosis, lumbar region without neurogenic claudication: Secondary | ICD-10-CM | POA: Diagnosis not present

## 2018-06-22 DIAGNOSIS — E039 Hypothyroidism, unspecified: Secondary | ICD-10-CM | POA: Diagnosis not present

## 2018-06-22 DIAGNOSIS — E785 Hyperlipidemia, unspecified: Secondary | ICD-10-CM | POA: Diagnosis not present

## 2018-06-22 DIAGNOSIS — E538 Deficiency of other specified B group vitamins: Secondary | ICD-10-CM | POA: Diagnosis not present

## 2018-06-23 ENCOUNTER — Ambulatory Visit (HOSPITAL_COMMUNITY): Payer: Medicare Other | Admitting: Psychiatry

## 2018-06-23 DIAGNOSIS — M48061 Spinal stenosis, lumbar region without neurogenic claudication: Secondary | ICD-10-CM | POA: Diagnosis not present

## 2018-06-23 DIAGNOSIS — I10 Essential (primary) hypertension: Secondary | ICD-10-CM | POA: Diagnosis not present

## 2018-06-23 DIAGNOSIS — E559 Vitamin D deficiency, unspecified: Secondary | ICD-10-CM | POA: Diagnosis not present

## 2018-06-23 DIAGNOSIS — E538 Deficiency of other specified B group vitamins: Secondary | ICD-10-CM | POA: Diagnosis not present

## 2018-06-23 DIAGNOSIS — E039 Hypothyroidism, unspecified: Secondary | ICD-10-CM | POA: Diagnosis not present

## 2018-06-23 DIAGNOSIS — E118 Type 2 diabetes mellitus with unspecified complications: Secondary | ICD-10-CM | POA: Diagnosis not present

## 2018-06-23 DIAGNOSIS — Z8542 Personal history of malignant neoplasm of other parts of uterus: Secondary | ICD-10-CM | POA: Diagnosis not present

## 2018-06-23 DIAGNOSIS — K219 Gastro-esophageal reflux disease without esophagitis: Secondary | ICD-10-CM | POA: Diagnosis not present

## 2018-06-23 DIAGNOSIS — E785 Hyperlipidemia, unspecified: Secondary | ICD-10-CM | POA: Diagnosis not present

## 2018-06-23 DIAGNOSIS — I480 Paroxysmal atrial fibrillation: Secondary | ICD-10-CM | POA: Diagnosis not present

## 2018-06-23 DIAGNOSIS — F329 Major depressive disorder, single episode, unspecified: Secondary | ICD-10-CM | POA: Diagnosis not present

## 2018-06-23 DIAGNOSIS — F332 Major depressive disorder, recurrent severe without psychotic features: Secondary | ICD-10-CM | POA: Diagnosis not present

## 2018-06-24 DIAGNOSIS — I10 Essential (primary) hypertension: Secondary | ICD-10-CM | POA: Diagnosis not present

## 2018-06-24 DIAGNOSIS — M48061 Spinal stenosis, lumbar region without neurogenic claudication: Secondary | ICD-10-CM | POA: Diagnosis not present

## 2018-06-24 DIAGNOSIS — K219 Gastro-esophageal reflux disease without esophagitis: Secondary | ICD-10-CM | POA: Diagnosis not present

## 2018-06-24 DIAGNOSIS — E559 Vitamin D deficiency, unspecified: Secondary | ICD-10-CM | POA: Diagnosis not present

## 2018-06-24 DIAGNOSIS — E538 Deficiency of other specified B group vitamins: Secondary | ICD-10-CM | POA: Diagnosis not present

## 2018-06-24 DIAGNOSIS — E039 Hypothyroidism, unspecified: Secondary | ICD-10-CM | POA: Diagnosis not present

## 2018-06-24 DIAGNOSIS — F332 Major depressive disorder, recurrent severe without psychotic features: Secondary | ICD-10-CM | POA: Diagnosis not present

## 2018-06-24 DIAGNOSIS — Z8542 Personal history of malignant neoplasm of other parts of uterus: Secondary | ICD-10-CM | POA: Diagnosis not present

## 2018-06-24 DIAGNOSIS — E785 Hyperlipidemia, unspecified: Secondary | ICD-10-CM | POA: Diagnosis not present

## 2018-06-24 DIAGNOSIS — I480 Paroxysmal atrial fibrillation: Secondary | ICD-10-CM | POA: Diagnosis not present

## 2018-06-24 DIAGNOSIS — E118 Type 2 diabetes mellitus with unspecified complications: Secondary | ICD-10-CM | POA: Diagnosis not present

## 2018-06-25 DIAGNOSIS — K219 Gastro-esophageal reflux disease without esophagitis: Secondary | ICD-10-CM | POA: Diagnosis not present

## 2018-06-25 DIAGNOSIS — I10 Essential (primary) hypertension: Secondary | ICD-10-CM | POA: Diagnosis not present

## 2018-06-25 DIAGNOSIS — E785 Hyperlipidemia, unspecified: Secondary | ICD-10-CM | POA: Diagnosis not present

## 2018-06-25 DIAGNOSIS — E118 Type 2 diabetes mellitus with unspecified complications: Secondary | ICD-10-CM | POA: Diagnosis not present

## 2018-06-25 DIAGNOSIS — E538 Deficiency of other specified B group vitamins: Secondary | ICD-10-CM | POA: Diagnosis not present

## 2018-06-25 DIAGNOSIS — M48061 Spinal stenosis, lumbar region without neurogenic claudication: Secondary | ICD-10-CM | POA: Diagnosis not present

## 2018-06-25 DIAGNOSIS — E039 Hypothyroidism, unspecified: Secondary | ICD-10-CM | POA: Diagnosis not present

## 2018-06-25 DIAGNOSIS — F332 Major depressive disorder, recurrent severe without psychotic features: Secondary | ICD-10-CM | POA: Diagnosis not present

## 2018-06-25 DIAGNOSIS — F329 Major depressive disorder, single episode, unspecified: Secondary | ICD-10-CM | POA: Diagnosis not present

## 2018-06-25 DIAGNOSIS — E559 Vitamin D deficiency, unspecified: Secondary | ICD-10-CM | POA: Diagnosis not present

## 2018-06-25 DIAGNOSIS — Z8542 Personal history of malignant neoplasm of other parts of uterus: Secondary | ICD-10-CM | POA: Diagnosis not present

## 2018-06-25 DIAGNOSIS — I480 Paroxysmal atrial fibrillation: Secondary | ICD-10-CM | POA: Diagnosis not present

## 2018-06-26 DIAGNOSIS — F332 Major depressive disorder, recurrent severe without psychotic features: Secondary | ICD-10-CM | POA: Diagnosis not present

## 2018-06-27 DIAGNOSIS — I10 Essential (primary) hypertension: Secondary | ICD-10-CM | POA: Diagnosis not present

## 2018-06-27 DIAGNOSIS — E538 Deficiency of other specified B group vitamins: Secondary | ICD-10-CM | POA: Diagnosis not present

## 2018-06-27 DIAGNOSIS — M48061 Spinal stenosis, lumbar region without neurogenic claudication: Secondary | ICD-10-CM | POA: Diagnosis not present

## 2018-06-27 DIAGNOSIS — I480 Paroxysmal atrial fibrillation: Secondary | ICD-10-CM | POA: Diagnosis not present

## 2018-06-27 DIAGNOSIS — E785 Hyperlipidemia, unspecified: Secondary | ICD-10-CM | POA: Diagnosis not present

## 2018-06-27 DIAGNOSIS — E118 Type 2 diabetes mellitus with unspecified complications: Secondary | ICD-10-CM | POA: Diagnosis not present

## 2018-06-27 DIAGNOSIS — K219 Gastro-esophageal reflux disease without esophagitis: Secondary | ICD-10-CM | POA: Diagnosis not present

## 2018-06-27 DIAGNOSIS — Z8542 Personal history of malignant neoplasm of other parts of uterus: Secondary | ICD-10-CM | POA: Diagnosis not present

## 2018-06-27 DIAGNOSIS — F332 Major depressive disorder, recurrent severe without psychotic features: Secondary | ICD-10-CM | POA: Diagnosis not present

## 2018-06-27 DIAGNOSIS — E559 Vitamin D deficiency, unspecified: Secondary | ICD-10-CM | POA: Diagnosis not present

## 2018-06-27 DIAGNOSIS — E039 Hypothyroidism, unspecified: Secondary | ICD-10-CM | POA: Diagnosis not present

## 2018-06-28 DIAGNOSIS — F332 Major depressive disorder, recurrent severe without psychotic features: Secondary | ICD-10-CM | POA: Diagnosis not present

## 2018-06-29 DIAGNOSIS — I10 Essential (primary) hypertension: Secondary | ICD-10-CM | POA: Diagnosis not present

## 2018-06-29 DIAGNOSIS — F332 Major depressive disorder, recurrent severe without psychotic features: Secondary | ICD-10-CM | POA: Diagnosis not present

## 2018-06-29 DIAGNOSIS — E1159 Type 2 diabetes mellitus with other circulatory complications: Secondary | ICD-10-CM | POA: Diagnosis not present

## 2018-06-29 DIAGNOSIS — E118 Type 2 diabetes mellitus with unspecified complications: Secondary | ICD-10-CM | POA: Diagnosis not present

## 2018-06-29 DIAGNOSIS — J029 Acute pharyngitis, unspecified: Secondary | ICD-10-CM | POA: Diagnosis not present

## 2018-06-29 DIAGNOSIS — I480 Paroxysmal atrial fibrillation: Secondary | ICD-10-CM | POA: Diagnosis not present

## 2018-06-29 DIAGNOSIS — R300 Dysuria: Secondary | ICD-10-CM | POA: Diagnosis not present

## 2018-06-30 DIAGNOSIS — F329 Major depressive disorder, single episode, unspecified: Secondary | ICD-10-CM | POA: Diagnosis not present

## 2018-06-30 DIAGNOSIS — F332 Major depressive disorder, recurrent severe without psychotic features: Secondary | ICD-10-CM | POA: Diagnosis not present

## 2018-07-01 DIAGNOSIS — F332 Major depressive disorder, recurrent severe without psychotic features: Secondary | ICD-10-CM | POA: Diagnosis not present

## 2018-07-02 DIAGNOSIS — F332 Major depressive disorder, recurrent severe without psychotic features: Secondary | ICD-10-CM | POA: Diagnosis not present

## 2018-07-02 DIAGNOSIS — F329 Major depressive disorder, single episode, unspecified: Secondary | ICD-10-CM | POA: Diagnosis not present

## 2018-07-03 DIAGNOSIS — F332 Major depressive disorder, recurrent severe without psychotic features: Secondary | ICD-10-CM | POA: Diagnosis not present

## 2018-07-04 DIAGNOSIS — F332 Major depressive disorder, recurrent severe without psychotic features: Secondary | ICD-10-CM | POA: Diagnosis not present

## 2018-07-05 DIAGNOSIS — F329 Major depressive disorder, single episode, unspecified: Secondary | ICD-10-CM | POA: Diagnosis not present

## 2018-07-05 DIAGNOSIS — F332 Major depressive disorder, recurrent severe without psychotic features: Secondary | ICD-10-CM | POA: Diagnosis not present

## 2018-07-06 DIAGNOSIS — Z8542 Personal history of malignant neoplasm of other parts of uterus: Secondary | ICD-10-CM | POA: Diagnosis not present

## 2018-07-06 DIAGNOSIS — F332 Major depressive disorder, recurrent severe without psychotic features: Secondary | ICD-10-CM | POA: Diagnosis not present

## 2018-07-06 DIAGNOSIS — E785 Hyperlipidemia, unspecified: Secondary | ICD-10-CM | POA: Diagnosis not present

## 2018-07-06 DIAGNOSIS — E559 Vitamin D deficiency, unspecified: Secondary | ICD-10-CM | POA: Diagnosis not present

## 2018-07-06 DIAGNOSIS — E039 Hypothyroidism, unspecified: Secondary | ICD-10-CM | POA: Diagnosis not present

## 2018-07-06 DIAGNOSIS — I480 Paroxysmal atrial fibrillation: Secondary | ICD-10-CM | POA: Diagnosis not present

## 2018-07-06 DIAGNOSIS — M48061 Spinal stenosis, lumbar region without neurogenic claudication: Secondary | ICD-10-CM | POA: Diagnosis not present

## 2018-07-06 DIAGNOSIS — E118 Type 2 diabetes mellitus with unspecified complications: Secondary | ICD-10-CM | POA: Diagnosis not present

## 2018-07-06 DIAGNOSIS — I10 Essential (primary) hypertension: Secondary | ICD-10-CM | POA: Diagnosis not present

## 2018-07-06 DIAGNOSIS — E538 Deficiency of other specified B group vitamins: Secondary | ICD-10-CM | POA: Diagnosis not present

## 2018-07-06 DIAGNOSIS — K219 Gastro-esophageal reflux disease without esophagitis: Secondary | ICD-10-CM | POA: Diagnosis not present

## 2018-07-07 DIAGNOSIS — F332 Major depressive disorder, recurrent severe without psychotic features: Secondary | ICD-10-CM | POA: Diagnosis not present

## 2018-07-07 DIAGNOSIS — F329 Major depressive disorder, single episode, unspecified: Secondary | ICD-10-CM | POA: Diagnosis not present

## 2018-07-08 DIAGNOSIS — I480 Paroxysmal atrial fibrillation: Secondary | ICD-10-CM | POA: Diagnosis not present

## 2018-07-08 DIAGNOSIS — I10 Essential (primary) hypertension: Secondary | ICD-10-CM | POA: Diagnosis not present

## 2018-07-08 DIAGNOSIS — E039 Hypothyroidism, unspecified: Secondary | ICD-10-CM | POA: Diagnosis not present

## 2018-07-08 DIAGNOSIS — F332 Major depressive disorder, recurrent severe without psychotic features: Secondary | ICD-10-CM | POA: Diagnosis not present

## 2018-07-08 DIAGNOSIS — K219 Gastro-esophageal reflux disease without esophagitis: Secondary | ICD-10-CM | POA: Diagnosis not present

## 2018-07-08 DIAGNOSIS — Z8542 Personal history of malignant neoplasm of other parts of uterus: Secondary | ICD-10-CM | POA: Diagnosis not present

## 2018-07-08 DIAGNOSIS — E538 Deficiency of other specified B group vitamins: Secondary | ICD-10-CM | POA: Diagnosis not present

## 2018-07-08 DIAGNOSIS — E785 Hyperlipidemia, unspecified: Secondary | ICD-10-CM | POA: Diagnosis not present

## 2018-07-08 DIAGNOSIS — E118 Type 2 diabetes mellitus with unspecified complications: Secondary | ICD-10-CM | POA: Diagnosis not present

## 2018-07-08 DIAGNOSIS — M48061 Spinal stenosis, lumbar region without neurogenic claudication: Secondary | ICD-10-CM | POA: Diagnosis not present

## 2018-07-08 DIAGNOSIS — E559 Vitamin D deficiency, unspecified: Secondary | ICD-10-CM | POA: Diagnosis not present

## 2018-07-09 ENCOUNTER — Other Ambulatory Visit: Payer: Self-pay | Admitting: Cardiology

## 2018-07-13 ENCOUNTER — Ambulatory Visit (HOSPITAL_COMMUNITY): Payer: Medicare Other | Admitting: Psychiatry

## 2018-07-13 DIAGNOSIS — F329 Major depressive disorder, single episode, unspecified: Secondary | ICD-10-CM | POA: Diagnosis not present

## 2018-07-13 DIAGNOSIS — E039 Hypothyroidism, unspecified: Secondary | ICD-10-CM | POA: Diagnosis not present

## 2018-07-13 DIAGNOSIS — Z299 Encounter for prophylactic measures, unspecified: Secondary | ICD-10-CM | POA: Diagnosis not present

## 2018-07-13 DIAGNOSIS — E538 Deficiency of other specified B group vitamins: Secondary | ICD-10-CM | POA: Diagnosis not present

## 2018-07-13 DIAGNOSIS — E7800 Pure hypercholesterolemia, unspecified: Secondary | ICD-10-CM | POA: Diagnosis not present

## 2018-07-13 DIAGNOSIS — I10 Essential (primary) hypertension: Secondary | ICD-10-CM | POA: Diagnosis not present

## 2018-07-13 DIAGNOSIS — Z6834 Body mass index (BMI) 34.0-34.9, adult: Secondary | ICD-10-CM | POA: Diagnosis not present

## 2018-07-14 ENCOUNTER — Ambulatory Visit (INDEPENDENT_AMBULATORY_CARE_PROVIDER_SITE_OTHER): Payer: Medicare Other | Admitting: Psychiatry

## 2018-07-14 ENCOUNTER — Encounter (HOSPITAL_COMMUNITY): Payer: Self-pay | Admitting: Psychiatry

## 2018-07-14 DIAGNOSIS — F411 Generalized anxiety disorder: Secondary | ICD-10-CM

## 2018-07-14 MED ORDER — BUSPIRONE HCL 15 MG PO TABS: 15.0000 mg | ORAL_TABLET | Freq: Three times a day (TID) | ORAL | 2 refills | Status: DC

## 2018-07-14 MED ORDER — BUSPIRONE HCL 15 MG PO TABS: 15.0000 mg | ORAL_TABLET | Freq: Two times a day (BID) | ORAL | 2 refills | Status: DC

## 2018-07-14 MED ORDER — VENLAFAXINE HCL ER 150 MG PO CP24: 150.0000 mg | ORAL_CAPSULE | Freq: Every day | ORAL | 2 refills | Status: DC

## 2018-07-14 MED ORDER — ALPRAZOLAM 0.25 MG PO TABS: 0.2500 mg | ORAL_TABLET | Freq: Three times a day (TID) | ORAL | 2 refills | Status: DC

## 2018-07-14 NOTE — Progress Notes (Signed)
BH MD/PA/NP OP Progress Note  07/14/2018 2:55 PM Yesenia Reynolds  MRN:  [id number]  Chief Complaint:  Chief Complaint    Depression; Anxiety; Follow-up     HPI: This patient is a 77 year old single white female who livesIn a rest home. 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  The patient was referred by her oncologist for further treatment and assessment of severe anxiety.  The patient states that she has had no prior psychiatric assessment or treatment. She began to have endometrial bleeding in April 2016. She was found to have endometrial cancer and underwent hysterectomy. Following that she had 2 bouts of chemotherapy followed by radiation and eventually intravaginal radiation. She states that she finished all these treatments in October of 2016. She states that she did very well throughout the treatments and stayed calm and did not have significant problems with nausea and vomiting.  In November 2016 she began to have significant problems with anxiety. She didn't want anyone coming around and visiting her she felt extremely anxious being around people are going out to restaurants or to church. She is been a very gregarious person all her life and her personality totally changed. She denied being depressed sad or crying. She was shaking all the time and couldn't stay alone. She still can't stay alone. She stopped driving her car. She gave up things she enjoyed like going to her nephew's baseball games. She also could not watch anything frightening like police shows on TV because she would become extremely anxious. She is eating fairly well but lost a good deal of weight during her treatments. She is sleeping well and denies nightmares. She states that she had short-term memory loss shortly after treatment but this is gotten much better.  She was tried on Paxil but it was not helpful. Her doctor has her on Xanax 0.25 mg which does help but she  takes it as needed. Her gynecologist put her on Lamictal and she is up to 150 mg a day. She thinks this is helped more than anything else because she is less anxious than she was and is slowly starting to drive a little bit and be around a few people at a time. Her hand still shake at times. She would like to get out and do all the things she used to do such as drive on her own go to baseball games enjoy her friends and church but she is beginning to realize that this is going to be a slow process. She denies suicidal ideation or auditory or visual hallucinations  The patient and sister return for follow-up after 2 months.  The patient was admitted to [facility] in [place] on 06/08/2018 and stayed until 07/08/2018.  She got in on the advice of a friend because her anxiety had gotten so bad that she could barely function.  While there she underwent ECT for total of 7 treatments.  She seems to be doing much better in terms of mood.  She was also taken off the fentanyl patch and her antidepressant was changed to Effexor XR 150 mg daily.  She is still on low-dose Xanax and her BuSpar has been increased to 15 mg twice a day.  She has been back home for under a week and is still in the retirement home.  She states that she likes it there and some new people have been in residence and she is making new friends.  She feels very weak but it was determined that while in the hospital her B12 and vitamin D are low and she is taking vitamin D and getting B12 injections twice a week.  She is eating well and sleeping well and she seems to be walking well with her walker.  She has far less anxiety and is no longer shaky or crying.  She went out with her sister and is feeling comfortable out in the community.  She denies any thoughts of suicide and she is logical and coherent.  Visit Diagnosis:    ICD-10-CM   1. Generalized anxiety disorder F41.1 ALPRAZolam (XANAX) 0.25 MG tablet    Past Psychiatric History: Recent  psychiatric hospitalization for depression and anxiety  Past Medical History:  Past Medical History:  Diagnosis Date  . Agoraphobia with panic attacks 08/07/2016  . Anxiety   . Arthritis   . Atrial fibrillation, currently in sinus rhythm   . Cancer [facility])    Endometrial  . Depression   . Endometrial cancer, FIGO stage IIIC ([place]) 08/08/2015  . Essential hypertension, benign   . GERD (gastroesophageal reflux disease)   . Hypothyroidism   . Mixed hyperlipidemia   . Palpitations   . Spinal stenosis   . Spinal stenosis of lumbar region at multiple levels 08/07/2016  . Type 2 diabetes mellitus ([place])     Past Surgical History:  Procedure Laterality Date  . ABDOMINAL HYSTERECTOMY    . BREAST BIOPSY     benign  . CATARACT EXTRACTION W/PHACO Left 03/31/2017   Procedure: CATARACT EXTRACTION PHACO AND INTRAOCULAR LENS PLACEMENT (IOC);  Surgeon: Rutherford Guys, MD;  Location: [facility];  Service: Ophthalmology;  Laterality: Left;  CDE: 8.67  . CATARACT EXTRACTION W/PHACO Right 04/14/2017   Procedure: CATARACT EXTRACTION PHACO AND INTRAOCULAR LENS PLACEMENT RIGHT EYE;  Surgeon: Rutherford Guys, MD;  Location: [facility];  Service: Ophthalmology;  Laterality: Right;  CDE: 8.52  . LIPOMA EXCISION    . Multiple dental extractions    . PORT-A-CATH REMOVAL Right 09/02/2017   Procedure: MINOR REMOVAL PORT-A-CATH;  Surgeon: Aviva Signs, MD;  Location: [facility];  Service: General;  Laterality: Right;  . TONSILLECTOMY AND ADENOIDECTOMY    . TOTAL KNEE ARTHROPLASTY  08/04/2012   Procedure: TOTAL KNEE ARTHROPLASTY;  Surgeon: Gearlean Alf, MD;  Location: [facility];  Service: Orthopedics;  Laterality: Left;    Family Psychiatric History: none  Family History:  Family History  Problem Relation Age of Onset  . Stroke Mother   . Heart failure Mother   . Hypertension Father   . Coronary artery disease Father     Social History:  Social History   Socioeconomic History  . Marital status: Single    Spouse name:  Not on file  . Number of children: Not on file  . Years of education: Not on file  . Highest education level: Not on file  Occupational History  . Not on file  Social Needs  . Financial resource strain: Not on file  . Food insecurity:    Worry: Not on file    Inability: Not on file  . Transportation needs:    Medical: Not on file    Non-medical: Not on file  Tobacco Use  . Smoking status: Never Smoker  . Smokeless tobacco: Never Used  Substance and Sexual Activity  . Alcohol use: No    Comment: 07/10/2016 per pt no  . Drug use: No    Comment: 8/3/2017per pt no   .  Sexual activity: Never  Lifestyle  . Physical activity:    Days per week: Not on file    Minutes per session: Not on file  . Stress: Not on file  Relationships  . Social connections:    Talks on phone: Not on file    Gets together: Not on file    Attends religious service: Not on file    Active member of club or organization: Not on file    Attends meetings of clubs or organizations: Not on file    Relationship status: Not on file  Other Topics Concern  . Not on file  Social History Narrative  . Not on file    Allergies:  Allergies  Allergen Reactions  . Morphine And Related Other (See Comments)    Hypotension  . Other     Preservative in latanoprost causes redness, pt uses preservative free latanoprost   . Paxil [Paroxetine Hcl]     unknown  . Latex Rash    Metabolic Disorder Labs: No results found for: HGBA1C, MPG No results found for: PROLACTIN No results found for: CHOL, TRIG, HDL, CHOLHDL, VLDL, LDLCALC No results found for: TSH  Therapeutic Level Labs: No results found for: LITHIUM No results found for: VALPROATE No components found for:  CBMZ  Current Medications: Current Outpatient Medications  Medication Sig Dispense Refill  . acetaminophen (TYLENOL 8 HOUR ARTHRITIS PAIN) 650 MG CR tablet Take 650 mg by mouth every 8 (eight) hours as needed for pain.    Marland Kitchen ALPRAZolam (XANAX) 0.25 MG  tablet Take 1 tablet (0.25 mg total) by mouth 3 (three) times daily. 90 tablet 2  . alum & mag hydroxide-simeth (MAALOX/MYLANTA) 200-200-20 MG/5ML suspension Take 30 mLs by mouth every 6 (six) hours as needed for indigestion or heartburn.    Marland Kitchen aspirin 81 MG tablet Take 81 mg by mouth at bedtime.     Marland Kitchen atorvastatin (LIPITOR) 10 MG tablet Take 10 mg by mouth every evening.     . calcium carbonate (TUMS - DOSED IN MG ELEMENTAL CALCIUM) 500 MG chewable tablet Chew 1 tablet by mouth daily as needed for indigestion or heartburn.    . cholecalciferol (VITAMIN D) 1000 units tablet Take by mouth.    Marland Kitchen ibuprofen (ADVIL,MOTRIN) 200 MG tablet Take 200 mg by mouth 2 (two) times daily as needed for moderate pain.     Marland Kitchen latanoprost (XALATAN) 0.005 % ophthalmic solution Place 1 drop into both eyes at bedtime.    Marland Kitchen levothyroxine (SYNTHROID, LEVOTHROID) 88 MCG tablet Take 88 mcg by mouth daily before breakfast.     . lisinopril (PRINIVIL,ZESTRIL) 10 MG tablet Take 10 mg by mouth every morning.     . loratadine (CLARITIN) 10 MG tablet Take 10 mg by mouth daily as needed for allergies.    . metFORMIN (GLUCOPHAGE) 500 MG tablet Take by mouth.    . metoprolol succinate (TOPROL-XL) 25 MG 24 hr tablet take 1/2 tablet BY MOUTH every morning 3 tablet 0  . polyethylene glycol (MIRALAX / GLYCOLAX) packet Take 17 g by mouth daily as needed for mild constipation.     Marland Kitchen venlafaxine XR (EFFEXOR-XR) 150 MG 24 hr capsule Take 1 capsule (150 mg total) by mouth daily with breakfast. 30 capsule 2  . busPIRone (BUSPAR) 15 MG tablet Take 1 tablet (15 mg total) by mouth 3 (three) times daily. 90 tablet 2  . [START ON 07/27/2018] cyanocobalamin (,VITAMIN B-12,) 1000 MCG/ML injection Inject into the muscle.     No  current facility-administered medications for this visit.      Musculoskeletal: Strength & Muscle Tone: decreased Gait & Station: unsteady Patient leans: N/A  Psychiatric Specialty Exam: Review of Systems   Musculoskeletal: Positive for back pain.  Neurological: Positive for weakness.  All other systems reviewed and are negative.   Blood pressure 137/71, pulse 79, height 5\' 1"  (1.549 m), weight 184 lb (83.5 kg), SpO2 95 %.Body mass index is 34.77 kg/m.  General Appearance: Casual and Fairly Groomed  Eye Contact:  Good  Speech:  Clear and Coherent  Volume:  Normal  Mood:  Euthymic  Affect:  Congruent  Thought Process:  Goal Directed  Orientation:  Full (Time, Place, and Person)  Thought Content: WDL   Suicidal Thoughts:  No  Homicidal Thoughts:  No  Memory:  Immediate;   Good Recent;   Fair Remote;   Fair  Judgement:  Fair  Insight:  Fair  Psychomotor Activity:  Decreased  Concentration:  Concentration: Good and Attention Span: Good  Recall:  Good  Fund of Knowledge: Good  Language: Good  Akathisia:  No  Handed:  Right  AIMS (if indicated): not done  Assets:  Communication Skills Desire for Improvement Resilience Social Support Talents/Skills  ADL's:  Intact  Cognition: WNL  Sleep:  Good   Screenings: GAD-7     Counselor from 04/20/2018 in [place] Counselor from 09/15/2016 in [place] [facility]  Total GAD-7 Score  14  10    PHQ2-9     Counselor from 04/20/2018 in [place] [facility]  PHQ-2 Total Score  5  PHQ-9 Total Score  14       Assessment and Plan: This patient is a 77 year old female with a history of depression and anxiety since cancer treatment a few years ago.  We have tried numerous medications on outpatient basis but she has finally responded well to ECT.  She is doing much better now than I have seen her in a couple of years.  She will be maintained on Effexor XR 150 mg daily for depression, BuSpar 15 mg twice a day for anxiety and Xanax 0.25 mg 3 times daily for anxiety.  She does not want to continue counseling and does not feel like she  needs it.  She will return to see me in 4 weeks   [name], MD 07/14/2018, 2:55 PM

## 2018-08-03 ENCOUNTER — Ambulatory Visit (HOSPITAL_COMMUNITY): Payer: Self-pay | Admitting: Psychiatry

## 2018-08-13 ENCOUNTER — Inpatient Hospital Stay (HOSPITAL_COMMUNITY): Payer: Medicare Other | Attending: Hematology

## 2018-08-13 DIAGNOSIS — E119 Type 2 diabetes mellitus without complications: Secondary | ICD-10-CM | POA: Diagnosis not present

## 2018-08-13 DIAGNOSIS — I10 Essential (primary) hypertension: Secondary | ICD-10-CM | POA: Diagnosis not present

## 2018-08-13 DIAGNOSIS — C541 Malignant neoplasm of endometrium: Secondary | ICD-10-CM | POA: Insufficient documentation

## 2018-08-13 LAB — COMPREHENSIVE METABOLIC PANEL
ALT: 21 U/L (ref 0–44)
AST: 19 U/L (ref 15–41)
Albumin: 3.9 g/dL (ref 3.5–5.0)
Alkaline Phosphatase: 91 U/L (ref 38–126)
Anion gap: 8 (ref 5–15)
BUN: 19 mg/dL (ref 8–23)
CHLORIDE: 104 mmol/L (ref 98–111)
CO2: 28 mmol/L (ref 22–32)
CREATININE: 0.82 mg/dL (ref 0.44–1.00)
Calcium: 9 mg/dL (ref 8.9–10.3)
GFR calc Af Amer: >60 mL/min (ref >60)
GFR calc non Af Amer: >60 mL/min (ref >60)
GLUCOSE: 120 mg/dL — AB (ref 70–99)
POTASSIUM: 4 mmol/L (ref 3.5–5.1)
Sodium: 140 mmol/L (ref 135–145)
Total Bilirubin: 0.6 mg/dL (ref 0.3–1.2)
Total Protein: 6.7 g/dL (ref 6.5–8.1)

## 2018-08-13 LAB — CBC WITH DIFFERENTIAL/PLATELET
BASOS ABS: 0 10*3/uL (ref 0.0–0.1)
BASOS PCT: 0 %
EOS PCT: 2 %
Eosinophils Absolute: 0.2 10*3/uL (ref 0.0–0.7)
HCT: 40.2 % (ref 36.0–46.0)
Hemoglobin: 12.6 g/dL (ref 12.0–15.0)
LYMPHS PCT: 14 %
Lymphs Abs: 1.2 10*3/uL (ref 0.7–4.0)
MCH: 28.1 pg (ref 26.0–34.0)
MCHC: 31.3 g/dL (ref 30.0–36.0)
MCV: 89.7 fL (ref 78.0–100.0)
MONO ABS: 0.6 10*3/uL (ref 0.1–1.0)
Monocytes Relative: 8 %
NEUTROS ABS: 6.4 10*3/uL (ref 1.7–7.7)
Neutrophils Relative %: 76 %
PLATELETS: 198 10*3/uL (ref 150–400)
RBC: 4.48 MIL/uL (ref 3.87–5.11)
RDW: 13.3 % (ref 11.5–15.5)
WBC: 8.4 10*3/uL (ref 4.0–10.5)

## 2018-08-14 LAB — CA 125: CANCER ANTIGEN (CA) 125: 10.9 U/mL (ref 0.0–38.1)

## 2018-08-17 ENCOUNTER — Ambulatory Visit (INDEPENDENT_AMBULATORY_CARE_PROVIDER_SITE_OTHER): Payer: Medicare Other | Admitting: Psychiatry

## 2018-08-17 ENCOUNTER — Encounter (HOSPITAL_COMMUNITY): Payer: Self-pay | Admitting: Psychiatry

## 2018-08-17 DIAGNOSIS — F411 Generalized anxiety disorder: Secondary | ICD-10-CM | POA: Diagnosis not present

## 2018-08-17 DIAGNOSIS — M549 Dorsalgia, unspecified: Secondary | ICD-10-CM | POA: Diagnosis not present

## 2018-08-17 MED ORDER — VENLAFAXINE HCL ER 150 MG PO CP24: 150.0000 mg | ORAL_CAPSULE | Freq: Every day | ORAL | 2 refills | Status: DC

## 2018-08-17 MED ORDER — BUSPIRONE HCL 15 MG PO TABS: 15.0000 mg | ORAL_TABLET | Freq: Two times a day (BID) | ORAL | 2 refills | Status: DC

## 2018-08-17 MED ORDER — ALPRAZOLAM 0.25 MG PO TABS: 0.2500 mg | ORAL_TABLET | Freq: Three times a day (TID) | ORAL | 2 refills | Status: DC

## 2018-08-17 NOTE — Progress Notes (Signed)
[place] MD/PA/NP OP Progress Note  08/17/2018 10:31 AM Yesenia Reynolds  MRN:  [id number]  Chief Complaint:  Chief Complaint    Depression; Anxiety; Follow-up     HPI: This patient is a 77 year old single white female who livesIn a rest home. 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  The patient was referred by her oncologist for further treatment and assessment of severe anxiety.  The patient states that she has had no prior psychiatric assessment or treatment. She began to have endometrial bleeding in April 2016. She was found to have endometrial cancer and underwent hysterectomy. Following that she had 2 bouts of chemotherapy followed by radiation and eventually intravaginal radiation. She states that she finished all these treatments in October of 2016. She states that she did very well throughout the treatments and stayed calm and did not have significant problems with nausea and vomiting.  In November 2016 she began to have significant problems with anxiety. She didn't want anyone coming around and visiting her she felt extremely anxious being around people are going out to restaurants or to church. She is been a very gregarious person all her life and her personality totally changed. She denied being depressed sad or crying. She was shaking all the time and couldn't stay alone. She still can't stay alone. She stopped driving her car. She gave up things she enjoyed like going to her nephew's baseball games. She also could not watch anything frightening like police shows on TV because she would become extremely anxious. She is eating fairly well but lost a good deal of weight during her treatments. She is sleeping well and denies nightmares. She states that she had short-term memory loss shortly after treatment but this is gotten much better.  She was tried on Paxil but it was not helpful. Her doctor has her on Xanax 0.25 mg which does help but she  takes it as needed. Her gynecologist put her on Lamictal and she is up to 150 mg a day. She thinks this is helped more than anything else because she is less anxious than she was and is slowly starting to drive a little bit and be around a few people at a time. Her hand still shake at times. She would like to get out and do all the things she used to do such as drive on her own go to baseball games enjoy her friends and church but she is beginning to realize that this is going to be a slow process. She denies suicidal ideation or auditory or visual hallucinations  The patient returns after 1 month.  Continues to do well on her current regimen.  She is pleasant and upbeat today.  She is been getting out with friends and family.  She is been having lunch with people.  She is trying to walk every day and her walking is improved.  She is going to continue her B12 injections because he really helped her energy.  She [facility] in [place] and is made a world of difference.  She continues with his Xanax and BuSpar and they have really helped her anxiety.  She is no longer shaking.  For the most part she is sleeping well.  Visit Diagnosis:    ICD-10-CM   1. Generalized anxiety disorder F41.1 ALPRAZolam (XANAX) 0.25 MG tablet    Past Psychiatric History: Recent psychiatric hospitalization for depression and  anxiety with ECT treatments  Past Medical History:  Past Medical History:  Diagnosis Date  . Agoraphobia with panic attacks 08/07/2016  . Anxiety   . Arthritis   . Atrial fibrillation, currently in sinus rhythm   . Cancer [facility])    Endometrial  . Depression   . Endometrial cancer, FIGO stage IIIC ([place]) 08/08/2015  . Essential hypertension, benign   . GERD (gastroesophageal reflux disease)   . Hypothyroidism   . Mixed hyperlipidemia   . Palpitations   . Spinal stenosis   . Spinal stenosis of lumbar region at multiple levels 08/07/2016  . Type 2  diabetes mellitus ([place])     Past Surgical History:  Procedure Laterality Date  . ABDOMINAL HYSTERECTOMY    . BREAST BIOPSY     benign  . CATARACT EXTRACTION W/PHACO Left 03/31/2017   Procedure: CATARACT EXTRACTION PHACO AND INTRAOCULAR LENS PLACEMENT (IOC);  Surgeon: Rutherford Guys, MD;  Location: [facility];  Service: Ophthalmology;  Laterality: Left;  CDE: 8.67  . CATARACT EXTRACTION W/PHACO Right 04/14/2017   Procedure: CATARACT EXTRACTION PHACO AND INTRAOCULAR LENS PLACEMENT RIGHT EYE;  Surgeon: Rutherford Guys, MD;  Location: [facility];  Service: Ophthalmology;  Laterality: Right;  CDE: 8.52  . LIPOMA EXCISION    . Multiple dental extractions    . PORT-A-CATH REMOVAL Right 09/02/2017   Procedure: MINOR REMOVAL PORT-A-CATH;  Surgeon: Aviva Signs, MD;  Location: [facility];  Service: General;  Laterality: Right;  . TONSILLECTOMY AND ADENOIDECTOMY    . TOTAL KNEE ARTHROPLASTY  08/04/2012   Procedure: TOTAL KNEE ARTHROPLASTY;  Surgeon: Gearlean Alf, MD;  Location: [facility];  Service: Orthopedics;  Laterality: Left;    Family Psychiatric History: None  Family History:  Family History  Problem Relation Age of Onset  . Stroke Mother   . Heart failure Mother   . Hypertension Father   . Coronary artery disease Father     Social History:  Social History   Socioeconomic History  . Marital status: Single    Spouse name: Not on file  . Number of children: Not on file  . Years of education: Not on file  . Highest education level: Not on file  Occupational History  . Not on file  Social Needs  . Financial resource strain: Not on file  . Food insecurity:    Worry: Not on file    Inability: Not on file  . Transportation needs:    Medical: Not on file    Non-medical: Not on file  Tobacco Use  . Smoking status: Never Smoker  . Smokeless tobacco: Never Used  Substance and Sexual Activity  . Alcohol use: No    Comment: 07/10/2016 per pt no  . Drug use: No    Comment: 8/3/2017per pt no   .  Sexual activity: Never  Lifestyle  . Physical activity:    Days per week: Not on file    Minutes per session: Not on file  . Stress: Not on file  Relationships  . Social connections:    Talks on phone: Not on file    Gets together: Not on file    Attends religious service: Not on file    Active member of club or organization: Not on file    Attends meetings of clubs or organizations: Not on file    Relationship status: Not on file  Other Topics Concern  . Not on file  Social History Narrative  . Not on file    Allergies:  Allergies  Allergen Reactions  . Morphine And Related Other (See Comments)    Hypotension  . Other     Preservative in latanoprost causes redness, pt uses preservative free latanoprost   . Paxil [Paroxetine Hcl]     unknown  . Latex Rash    Metabolic Disorder Labs: No results found for: HGBA1C, MPG No results found for: PROLACTIN No results found for: CHOL, TRIG, HDL, CHOLHDL, VLDL, LDLCALC No results found for: TSH  Therapeutic Level Labs: No results found for: LITHIUM No results found for: VALPROATE No components found for:  CBMZ  Current Medications: Current Outpatient Medications  Medication Sig Dispense Refill  . acetaminophen (TYLENOL 8 HOUR ARTHRITIS PAIN) 650 MG CR tablet Take 650 mg by mouth every 8 (eight) hours as needed for pain.    Marland Kitchen ALPRAZolam (XANAX) 0.25 MG tablet Take 1 tablet (0.25 mg total) by mouth 3 (three) times daily. 90 tablet 2  . alum & mag hydroxide-simeth (MAALOX/MYLANTA) 200-200-20 MG/5ML suspension Take 30 mLs by mouth every 6 (six) hours as needed for indigestion or heartburn.    Marland Kitchen aspirin 81 MG tablet Take 81 mg by mouth at bedtime.     Marland Kitchen atorvastatin (LIPITOR) 10 MG tablet Take 10 mg by mouth every evening.     . busPIRone (BUSPAR) 15 MG tablet Take 1 tablet (15 mg total) by mouth 2 (two) times daily. 60 tablet 2  . calcium carbonate (TUMS - DOSED IN MG ELEMENTAL CALCIUM) 500 MG chewable tablet Chew 1 tablet by  mouth daily as needed for indigestion or heartburn.    . cyanocobalamin (,VITAMIN B-12,) 1000 MCG/ML injection Inject into the muscle.    . ibuprofen (ADVIL,MOTRIN) 200 MG tablet Take 200 mg by mouth 2 (two) times daily as needed for moderate pain.     Marland Kitchen latanoprost (XALATAN) 0.005 % ophthalmic solution Place 1 drop into both eyes at bedtime.    Marland Kitchen levothyroxine (SYNTHROID, LEVOTHROID) 88 MCG tablet Take 88 mcg by mouth daily before breakfast.     . lisinopril (PRINIVIL,ZESTRIL) 10 MG tablet Take 10 mg by mouth every morning.     . loratadine (CLARITIN) 10 MG tablet Take 10 mg by mouth daily as needed for allergies.    . metoprolol succinate (TOPROL-XL) 25 MG 24 hr tablet take 1/2 tablet BY MOUTH every morning 3 tablet 0  . polyethylene glycol (MIRALAX / GLYCOLAX) packet Take 17 g by mouth daily as needed for mild constipation.     Marland Kitchen venlafaxine XR (EFFEXOR-XR) 150 MG 24 hr capsule Take 1 capsule (150 mg total) by mouth daily with breakfast. 30 capsule 2  . metFORMIN (GLUCOPHAGE) 500 MG tablet Take by mouth.     No current facility-administered medications for this visit.      Musculoskeletal: Strength & Muscle Tone: decreased Gait & Station: unsteady Patient leans:no  Psychiatric Specialty Exam: Review of Systems  Musculoskeletal: Positive for back pain.  All other systems reviewed and are negative.   Blood pressure (!) 168/82, pulse 100, height 5\' 1"  (1.549 m), weight 182 lb (82.6 kg), SpO2 97 %.Body mass index is 34.39 kg/m.  General Appearance: Casual  Eye Contact:  Good  Speech:  Clear and Coherent  Volume:  Normal  Mood:  Euthymic  Affect:  Congruent  Thought Process:  Goal Directed  Orientation:  Full (Time, Place, and Person)  Thought Content: WDL   Suicidal Thoughts:  No  Homicidal Thoughts:  No  Memory:  Immediate;   Good Recent;  Good Remote;   Good  Judgement:  Good  Insight:  Fair  Psychomotor Activity:  Decreased  Concentration:  Concentration: Good and  Attention Span: Good  Recall:  Good  Fund of Knowledge: Good  Language: Good  Akathisia:  No  Handed:  Right  AIMS (if indicated): not done  Assets:  Communication Skills Desire for Improvement Resilience Social Support Talents/Skills  ADL's:  Intact  Cognition: WNL  Sleep:  Good   Screenings: GAD-7     Counselor from 04/20/2018 in [place] from 09/15/2016 in [place] [facility]  Total GAD-7 Score  14  10    PHQ2-9     Counselor from 04/20/2018 in [place] [facility]  PHQ-2 Total Score  5  PHQ-9 Total Score  14       Assessment and Plan: This patient is a 77 year old female with a history of severe depression and anxiety.  After having ECT treatment her mood and energy have improved as well as her anxiety.  She will continue Effexor XR 150 mg every morning for depression and BuSpar and Xanax as prescribed for anxiety.  She will return to see me in 2 months   [name], MD 08/17/2018, 10:31 AM

## 2018-08-19 DIAGNOSIS — M9901 Segmental and somatic dysfunction of cervical region: Secondary | ICD-10-CM | POA: Diagnosis not present

## 2018-08-19 DIAGNOSIS — M531 Cervicobrachial syndrome: Secondary | ICD-10-CM | POA: Diagnosis not present

## 2018-08-19 DIAGNOSIS — M9902 Segmental and somatic dysfunction of thoracic region: Secondary | ICD-10-CM | POA: Diagnosis not present

## 2018-08-19 DIAGNOSIS — M47816 Spondylosis without myelopathy or radiculopathy, lumbar region: Secondary | ICD-10-CM | POA: Diagnosis not present

## 2018-08-19 DIAGNOSIS — M9903 Segmental and somatic dysfunction of lumbar region: Secondary | ICD-10-CM | POA: Diagnosis not present

## 2018-08-19 DIAGNOSIS — M546 Pain in thoracic spine: Secondary | ICD-10-CM | POA: Diagnosis not present

## 2018-08-19 DIAGNOSIS — M47812 Spondylosis without myelopathy or radiculopathy, cervical region: Secondary | ICD-10-CM | POA: Diagnosis not present

## 2018-08-20 ENCOUNTER — Ambulatory Visit (HOSPITAL_COMMUNITY): Payer: Self-pay | Admitting: Adult Health

## 2018-08-20 ENCOUNTER — Inpatient Hospital Stay (HOSPITAL_BASED_OUTPATIENT_CLINIC_OR_DEPARTMENT_OTHER): Payer: Medicare Other | Admitting: Internal Medicine

## 2018-08-20 ENCOUNTER — Encounter (HOSPITAL_COMMUNITY): Payer: Self-pay | Admitting: Internal Medicine

## 2018-08-20 VITALS — BP 169/70 | HR 70 | Temp 98.7°F | Resp 16 | Wt 183.3 lb

## 2018-08-20 DIAGNOSIS — C541 Malignant neoplasm of endometrium: Secondary | ICD-10-CM

## 2018-08-20 DIAGNOSIS — I10 Essential (primary) hypertension: Secondary | ICD-10-CM

## 2018-08-20 DIAGNOSIS — E119 Type 2 diabetes mellitus without complications: Secondary | ICD-10-CM | POA: Diagnosis not present

## 2018-08-20 NOTE — Progress Notes (Signed)
Diagnosis Endometrial cancer ([place]) - Plan: CBC with Differential/Platelet, Comprehensive metabolic panel, Lactate dehydrogenase, CA 125  Staging Cancer Staging No matching staging information was found for the patient.  Assessment and Plan:  1.  Stage IIIC1 endometrial cancer : Pt was diagnosed in 02/2015. Treated at [facility] in [place], [place] under the care of Dr. Tressie Stalker. Completed chemo with Carbo/Taxol x 3 cycles, followed by EBRT which completed on 07/31/15.  Went on to complete 3 additional cycles of chemo through 10/03/15. She also had vaginal cuff brachytherapy x 3 treatments on 08/08/15, 08/14/15, & 08/21/15.  -Last CT abd/pelvis in 02/2016 without evidence of disease. No role for additional imaging, except as clinically indicated.   Pt doing well today.  She denies any abdominal bloating, pain.    Labs done 08/13/2018 reviewed and showed WBC 8.4 HB 12.6 plts 198,000.  Chemistries WNL with Cr 0.82, K+ 4, normal LFTs.  CA 125 11 which is WNL.    Pt will RTC in March 2020 for follow-up and labs.  She will be seen every 6 months until 5 years (2021) and than can go to yearly evaluation.    2.  (R) breast calcifications.  Pt had diagnostic right mammogram done at [facility] on 04/28/2018 that showed benign right breast calcifications.  She is recommended for follow-up imaging in 6 months which would be in 10/2018.  Will obtain results for review.  Pt denies any breast complaints on today's visit.    3.  HTN.  BP is 162/70.  Follow-up with PCP for monitoring.    4.  DM.  Follow-up with PCP as directed.    More than 25 minutes spent with 50% spent in counseling and coordination of care.    Interval History:  Historical data obtained from note dated 02/18/2018.  Stage IIIC1 endometrial cancer : Pt was diagnosed in 02/2015. Treated at [facility] in [place], [place] under the care of Dr. Tressie Stalker. Completed chemo with Carbo/Taxol x 3 cycles, followed by EBRT which  completed on 07/31/15.  Went on to complete 3 additional cycles of chemo through 10/03/15. She also had vaginal cuff brachytherapy x 3 treatments on 08/08/15, 08/14/15, & 08/21/15.  -Last CT abd/pelvis in 02/2016 without evidence of disease. No role for additional imaging, except as clinically indicated.   Current Status:  Pt is seen today for follow-up.  She denies any abdominal bloating or pain.  She had recent mammogram.      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  The endometrial cancer found on D&C, grade 2 by Dr. Barrie Dunker.    03/30/2015 Surgery    Robotic hysterectomy, bilateral salpingo-oophorectomy, sentinel lymph node mapping and resection, mini-laparotomy by Dr. Clenton Pare.    04/02/2015 Cancer Staging    Stage IIIc 1, grade 2    04/26/2015 - 10/03/2015 Chemotherapy    The patient had palonosetron (ALOXI) injection 0.25 mg, 0.25 mg, Intravenous,  Once, 6 of 6 cycles  CARBOplatin (PARAPLATIN) in sodium chloride 0.9 % 100 mL chemo infusion, , Intravenous,  Once, 6 of 6 cycles  PACLitaxel (TAXOL) 294 mg in dextrose 5 % 250 mL chemo infusion (> 42m/m2), 175 mg/m2 = 294 mg, Intravenous,  Once, 6 of 6 cycles  for chemotherapy treatment.      06/27/2015 - 07/31/2015 Radiation Therapy    External beam radiation therapy    08/08/2015 - 08/21/2015 Radiation Therapy    Vaginal cuff intracavitary brachytherapy delivered,  August 31, September 6, 08/21/2015. 18 gray in 3 fractions.    02/20/2016 Imaging    CT Abd/Pelvis- no evidence of metastatic disease status post hysterectomy. Improving fluid collection along the right pelvic sidewall, probably postoperative lymphocele or seroma.      Problem List Patient Active Problem List   Diagnosis Date Noted  . Primary osteoarthritis of left ankle [M19.072] 10/16/2017  . Primary open angle glaucoma of both eyes, mild stage [H40.1131] 02/10/2017  . 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  Spinal stenosis of lumbar region at multiple levels [M48.061] 08/07/2016  . Generalized anxiety disorder [F41.1] 05/29/2016  . Tick bite of neck [S10.96XA, W57.XXXA] 05/06/2016  . Anxiety and depression [F41.9, F32.9] 01/01/2016  . Hypomagnesemia [E83.42] 01/01/2016  . Port-A-Cath in place [facility] 12/04/2015  . Antineoplastic chemotherapy induced anemia [D64.81, T45.1X5A] 11/21/2015  . Neoplastic malignant related fatigue [R53.0] 11/21/2015  . Reactive depression [F32.9] 11/21/2015  . Anemia due to multiple mechanisms [D64.9] 10/15/2015  . Neuropathy [G62.9] 10/15/2015  . 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 Claustrophobia [F40.240] 06/18/2015  . Leg swelling [M79.89] 05/14/2015  . Bilateral sciatica [M54.31, M54.32] 04/23/2015  . Functional constipation [K59.04] 04/23/2015  . Benign essential hypertension [I10] 07/05/2012  . Mixed hyperlipidemia [E78.2] 07/05/2012  . Palpitations [R00.2] 07/05/2012  . Carotid artery disease ([place]) [I77.9] 07/05/2012    Past Medical History Past Medical History:  Diagnosis Date  . Agoraphobia with panic attacks 08/07/2016  . Anxiety   . Arthritis   . Atrial fibrillation, currently in sinus rhythm   . Cancer [facility])    Endometrial  . Depression   . Endometrial cancer, FIGO stage IIIC ([place]) 08/08/2015  . Essential hypertension, benign   . GERD (gastroesophageal reflux disease)   . Hypothyroidism   . Mixed hyperlipidemia   . Palpitations   . Spinal stenosis   . Spinal stenosis of lumbar region at multiple levels 08/07/2016  . Type 2 diabetes mellitus [facility])     Past Surgical History Past Surgical History:  Procedure Laterality Date  . ABDOMINAL HYSTERECTOMY    . BREAST BIOPSY     benign  . CATARACT EXTRACTION W/PHACO Left 03/31/2017   Procedure: CATARACT EXTRACTION PHACO AND INTRAOCULAR LENS PLACEMENT (IOC);  Surgeon: Rutherford Guys, MD;  Location: [facility];  Service: Ophthalmology;  Laterality: Left;  CDE: 8.67  . CATARACT EXTRACTION  W/PHACO Right 04/14/2017   Procedure: CATARACT EXTRACTION PHACO AND INTRAOCULAR LENS PLACEMENT RIGHT EYE;  Surgeon: Rutherford Guys, MD;  Location: [facility];  Service: Ophthalmology;  Laterality: Right;  CDE: 8.52  . LIPOMA EXCISION    . Multiple dental extractions    . PORT-A-CATH REMOVAL Right 09/02/2017   Procedure: MINOR REMOVAL PORT-A-CATH;  Surgeon: Aviva Signs, MD;  Location: [facility];  Service: General;  Laterality: Right;  . TONSILLECTOMY AND ADENOIDECTOMY    . TOTAL KNEE ARTHROPLASTY  08/04/2012   Procedure: TOTAL KNEE ARTHROPLASTY;  Surgeon: Gearlean Alf, MD;  Location: [facility];  Service: Orthopedics;  Laterality: Left;    Family History Family History  Problem Relation Age of Onset  . Stroke Mother   . Heart failure Mother   . Hypertension Father   . Coronary artery disease Father      Social History  reports that she has never smoked. She has never used smokeless tobacco. She reports that she does not drink alcohol or use drugs.  Medications  Current Outpatient Medications:  .  acetaminophen (TYLENOL 8 HOUR ARTHRITIS PAIN) 650 MG CR tablet,  Take 650 mg by mouth every 8 (eight) hours as needed for pain., Disp: , Rfl:  .  ALPRAZolam (XANAX) 0.25 MG tablet, Take 1 tablet (0.25 mg total) by mouth 3 (three) times daily., Disp: 90 tablet, Rfl: 2 .  alum & mag hydroxide-simeth (MAALOX/MYLANTA) 200-200-20 MG/5ML suspension, Take 30 mLs by mouth every 6 (six) hours as needed for indigestion or heartburn., Disp: , Rfl:  .  aspirin 81 MG tablet, Take 81 mg by mouth at bedtime. , Disp: , Rfl:  .  atorvastatin (LIPITOR) 10 MG tablet, Take 10 mg by mouth every evening. , Disp: , Rfl:  .  busPIRone (BUSPAR) 15 MG tablet, Take 1 tablet (15 mg total) by mouth 2 (two) times daily., Disp: 60 tablet, Rfl: 2 .  calcium carbonate (TUMS - DOSED IN MG ELEMENTAL CALCIUM) 500 MG chewable tablet, Chew 1 tablet by mouth daily as needed for indigestion or heartburn., Disp: , Rfl:  .  cyanocobalamin  (,VITAMIN B-12,) 1000 MCG/ML injection, Inject into the muscle., Disp: , Rfl:  .  ibuprofen (ADVIL,MOTRIN) 200 MG tablet, Take 200 mg by mouth 2 (two) times daily as needed for moderate pain. , Disp: , Rfl:  .  latanoprost (XALATAN) 0.005 % ophthalmic solution, Place 1 drop into both eyes at bedtime., Disp: , Rfl:  .  levothyroxine (SYNTHROID, LEVOTHROID) 88 MCG tablet, Take 88 mcg by mouth daily before breakfast. , Disp: , Rfl:  .  lisinopril (PRINIVIL,ZESTRIL) 10 MG tablet, Take 10 mg by mouth every morning. , Disp: , Rfl:  .  loratadine (CLARITIN) 10 MG tablet, Take 10 mg by mouth daily as needed for allergies., Disp: , Rfl:  .  metoprolol succinate (TOPROL-XL) 25 MG 24 hr tablet, take 1/2 tablet BY MOUTH every morning, Disp: 3 tablet, Rfl: 0 .  polyethylene glycol (MIRALAX / GLYCOLAX) packet, Take 17 g by mouth daily as needed for mild constipation. , Disp: , Rfl:  .  venlafaxine XR (EFFEXOR-XR) 150 MG 24 hr capsule, Take 1 capsule (150 mg total) by mouth daily with breakfast., Disp: 30 capsule, Rfl: 2 .  metFORMIN (GLUCOPHAGE) 500 MG tablet, Take by mouth., Disp: , Rfl:   Allergies Morphine and related; Other; Paxil [paroxetine hcl]; and Latex  Review of Systems Review of Systems - Oncology ROS negative   Physical Exam  Vitals Wt Readings from Last 3 Encounters:  08/20/18 183 lb 4.8 oz (83.1 kg)  06/03/18 203 lb (92.1 kg)  02/18/18 195 lb (88.5 kg)   Temp Readings from Last 3 Encounters:  08/20/18 98.7 F (37.1 C) (Oral)  06/03/18 99.6 F (37.6 C) (Oral)  02/18/18 99.5 F (37.5 C) (Oral)   BP Readings from Last 3 Encounters:  08/20/18 (!) 169/70  06/03/18 (!) 165/75  02/18/18 (!) 172/70   Pulse Readings from Last 3 Encounters:  08/20/18 70  06/03/18 92  02/18/18 93   Constitutional: Well-developed, well-nourished, and in no distress.   HENT: Head: Normocephalic and atraumatic.  Mouth/Throat: No oropharyngeal exudate. Mucosa moist. Eyes: Pupils are equal, round,  and reactive to light. Conjunctivae are normal. No scleral icterus.  Neck: Normal range of motion. Neck supple. No JVD present.  Cardiovascular: Normal rate, regular rhythm and normal heart sounds.  Exam reveals no gallop and no friction rub.   No murmur heard. Pulmonary/Chest: Effort normal and breath sounds normal. No respiratory distress. No wheezes.No rales.  Abdominal: Soft. Bowel sounds are normal. No distension. There is no tenderness. There is no guarding.  Musculoskeletal: No edema  or tenderness.  Lymphadenopathy: No cervical, axillary or supraclavicular adenopathy.  Neurological: Alert and oriented to person, place, and time. No cranial nerve deficit.  Skin: Skin is warm and dry. No rash noted. No erythema. No pallor.  Psychiatric: Affect and judgment normal.   Labs No visits with results within 3 Day(s) from this visit.  Latest known visit with results is:  Appointment on 08/13/2018  Component Date Value Ref Range Status  . WBC 08/13/2018 8.4  4.0 - 10.5 K/uL Final  . RBC 08/13/2018 4.48  3.87 - 5.11 MIL/uL Final  . Hemoglobin 08/13/2018 12.6  12.0 - 15.0 g/dL Final  . HCT 08/13/2018 40.2  36.0 - 46.0 % Final  . MCV 08/13/2018 89.7  78.0 - 100.0 fL Final  . MCH 08/13/2018 28.1  26.0 - 34.0 pg Final  . MCHC 08/13/2018 31.3  30.0 - 36.0 g/dL Final  . RDW 08/13/2018 13.3  11.5 - 15.5 % Final  . Platelets 08/13/2018 198  150 - 400 K/uL Final  . Neutrophils Relative % 08/13/2018 76  % Final  . Neutro Abs 08/13/2018 6.4  1.7 - 7.7 K/uL Final  . Lymphocytes Relative 08/13/2018 14  % Final  . Lymphs Abs 08/13/2018 1.2  0.7 - 4.0 K/uL Final  . Monocytes Relative 08/13/2018 8  % Final  . Monocytes Absolute 08/13/2018 0.6  0.1 - 1.0 K/uL Final  . Eosinophils Relative 08/13/2018 2  % Final  . Eosinophils Absolute 08/13/2018 0.2  0.0 - 0.7 K/uL Final  . Basophils Relative 08/13/2018 0  % Final  . Basophils Absolute 08/13/2018 0.0  0.0 - 0.1 K/uL Final   Performed at [facility], [address], [place], [place] [address]  . Sodium 08/13/2018 140  135 - 145 mmol/L Final  . Potassium 08/13/2018 4.0  3.5 - 5.1 mmol/L Final  . Chloride 08/13/2018 104  98 - 111 mmol/L Final  . CO2 08/13/2018 28  22 - 32 mmol/L Final  . Glucose, Bld 08/13/2018 120* 70 - 99 mg/dL Final  . BUN 08/13/2018 19  8 - 23 mg/dL Final  . Creatinine, Ser 08/13/2018 0.82  0.44 - 1.00 mg/dL Final  . Calcium 08/13/2018 9.0  8.9 - 10.3 mg/dL Final  . Total Protein 08/13/2018 6.7  6.5 - 8.1 g/dL Final  . Albumin 08/13/2018 3.9  3.5 - 5.0 g/dL Final  . AST 08/13/2018 19  15 - 41 U/L Final  . ALT 08/13/2018 21  0 - 44 U/L Final  . Alkaline Phosphatase 08/13/2018 91  38 - 126 U/L Final  . Total Bilirubin 08/13/2018 0.6  0.3 - 1.2 mg/dL Final  . GFR calc non Af Amer 08/13/2018 >60  >60 mL/min Final  . GFR calc Af Amer 08/13/2018 >60  >60 mL/min Final   Comment: (NOTE) The eGFR has been calculated using the CKD EPI equation. This calculation has not been validated in all clinical situations. eGFR's persistently <60 mL/min signify possible Chronic Kidney Disease.   Georgiann Hahn gap 08/13/2018 8  5 - 15 Final   Performed at [facility], [address], [place], [place] [address]  . Cancer Antigen (CA) 125 08/13/2018 10.9  0.0 - 38.1 U/mL Final   Comment: (NOTE) Roche [employer] Electrochemiluminescence Immunoassay (ECLIA) Values obtained with different assay methods or kits cannot be used interchangeably.  Results cannot be interpreted as absolute evidence of the presence or absence of malignant disease. Performed At: [facility] [place], [place] [id number] Rush Farmer MD  MN:[id number]      Pathology Orders Placed This Encounter  Procedures  . CBC with Differential/Platelet    Standing Status:   Future    Standing Expiration Date:   08/20/2020  . Comprehensive metabolic panel    Standing Status:   Future    Standing Expiration Date:   08/20/2020  . Lactate  dehydrogenase    Standing Status:   Future    Standing Expiration Date:   08/20/2020  . CA 125    Standing Status:   Future    Standing Expiration Date:   08/20/2020       Zoila Shutter MD

## 2018-08-24 ENCOUNTER — Ambulatory Visit (HOSPITAL_COMMUNITY): Payer: Self-pay | Admitting: Psychiatry

## 2018-08-24 DIAGNOSIS — S134XXA Sprain of ligaments of cervical spine, initial encounter: Secondary | ICD-10-CM | POA: Diagnosis not present

## 2018-08-24 DIAGNOSIS — M47816 Spondylosis without myelopathy or radiculopathy, lumbar region: Secondary | ICD-10-CM | POA: Diagnosis not present

## 2018-08-24 DIAGNOSIS — M9901 Segmental and somatic dysfunction of cervical region: Secondary | ICD-10-CM | POA: Diagnosis not present

## 2018-08-24 DIAGNOSIS — M9902 Segmental and somatic dysfunction of thoracic region: Secondary | ICD-10-CM | POA: Diagnosis not present

## 2018-08-24 DIAGNOSIS — M546 Pain in thoracic spine: Secondary | ICD-10-CM | POA: Diagnosis not present

## 2018-08-24 DIAGNOSIS — M9903 Segmental and somatic dysfunction of lumbar region: Secondary | ICD-10-CM | POA: Diagnosis not present

## 2018-08-27 DIAGNOSIS — S134XXA Sprain of ligaments of cervical spine, initial encounter: Secondary | ICD-10-CM | POA: Diagnosis not present

## 2018-08-27 DIAGNOSIS — M9901 Segmental and somatic dysfunction of cervical region: Secondary | ICD-10-CM | POA: Diagnosis not present

## 2018-08-27 DIAGNOSIS — M546 Pain in thoracic spine: Secondary | ICD-10-CM | POA: Diagnosis not present

## 2018-08-27 DIAGNOSIS — M9902 Segmental and somatic dysfunction of thoracic region: Secondary | ICD-10-CM | POA: Diagnosis not present

## 2018-08-27 DIAGNOSIS — M47816 Spondylosis without myelopathy or radiculopathy, lumbar region: Secondary | ICD-10-CM | POA: Diagnosis not present

## 2018-08-27 DIAGNOSIS — M9903 Segmental and somatic dysfunction of lumbar region: Secondary | ICD-10-CM | POA: Diagnosis not present

## 2018-08-30 DIAGNOSIS — M47816 Spondylosis without myelopathy or radiculopathy, lumbar region: Secondary | ICD-10-CM | POA: Diagnosis not present

## 2018-08-30 DIAGNOSIS — M9902 Segmental and somatic dysfunction of thoracic region: Secondary | ICD-10-CM | POA: Diagnosis not present

## 2018-08-30 DIAGNOSIS — S134XXA Sprain of ligaments of cervical spine, initial encounter: Secondary | ICD-10-CM | POA: Diagnosis not present

## 2018-08-30 DIAGNOSIS — M546 Pain in thoracic spine: Secondary | ICD-10-CM | POA: Diagnosis not present

## 2018-08-30 DIAGNOSIS — M9903 Segmental and somatic dysfunction of lumbar region: Secondary | ICD-10-CM | POA: Diagnosis not present

## 2018-08-30 DIAGNOSIS — M9901 Segmental and somatic dysfunction of cervical region: Secondary | ICD-10-CM | POA: Diagnosis not present

## 2018-09-09 DIAGNOSIS — C541 Malignant neoplasm of endometrium: Secondary | ICD-10-CM | POA: Diagnosis not present

## 2018-09-14 DIAGNOSIS — H401131 Primary open-angle glaucoma, bilateral, mild stage: Secondary | ICD-10-CM | POA: Diagnosis not present

## 2018-09-15 DIAGNOSIS — Z299 Encounter for prophylactic measures, unspecified: Secondary | ICD-10-CM | POA: Diagnosis not present

## 2018-09-15 DIAGNOSIS — I10 Essential (primary) hypertension: Secondary | ICD-10-CM | POA: Diagnosis not present

## 2018-09-15 DIAGNOSIS — E1165 Type 2 diabetes mellitus with hyperglycemia: Secondary | ICD-10-CM | POA: Diagnosis not present

## 2018-09-15 DIAGNOSIS — Z6834 Body mass index (BMI) 34.0-34.9, adult: Secondary | ICD-10-CM | POA: Diagnosis not present

## 2018-09-15 DIAGNOSIS — C55 Malignant neoplasm of uterus, part unspecified: Secondary | ICD-10-CM | POA: Diagnosis not present

## 2018-09-15 DIAGNOSIS — Z23 Encounter for immunization: Secondary | ICD-10-CM | POA: Diagnosis not present

## 2018-09-21 ENCOUNTER — Ambulatory Visit (HOSPITAL_COMMUNITY): Payer: Self-pay | Admitting: Psychiatry

## 2018-10-11 DIAGNOSIS — Z7189 Other specified counseling: Secondary | ICD-10-CM | POA: Diagnosis not present

## 2018-10-11 DIAGNOSIS — E1165 Type 2 diabetes mellitus with hyperglycemia: Secondary | ICD-10-CM | POA: Diagnosis not present

## 2018-10-11 DIAGNOSIS — Z299 Encounter for prophylactic measures, unspecified: Secondary | ICD-10-CM | POA: Diagnosis not present

## 2018-10-11 DIAGNOSIS — I10 Essential (primary) hypertension: Secondary | ICD-10-CM | POA: Diagnosis not present

## 2018-10-11 DIAGNOSIS — Z6834 Body mass index (BMI) 34.0-34.9, adult: Secondary | ICD-10-CM | POA: Diagnosis not present

## 2018-10-11 DIAGNOSIS — Z0000 Encounter for general adult medical examination without abnormal findings: Secondary | ICD-10-CM | POA: Diagnosis not present

## 2018-10-11 DIAGNOSIS — Z1331 Encounter for screening for depression: Secondary | ICD-10-CM | POA: Diagnosis not present

## 2018-10-11 DIAGNOSIS — Z1339 Encounter for screening examination for other mental health and behavioral disorders: Secondary | ICD-10-CM | POA: Diagnosis not present

## 2018-10-11 DIAGNOSIS — E039 Hypothyroidism, unspecified: Secondary | ICD-10-CM | POA: Diagnosis not present

## 2018-10-11 DIAGNOSIS — E7800 Pure hypercholesterolemia, unspecified: Secondary | ICD-10-CM | POA: Diagnosis not present

## 2018-10-12 DIAGNOSIS — E039 Hypothyroidism, unspecified: Secondary | ICD-10-CM | POA: Diagnosis not present

## 2018-10-12 DIAGNOSIS — Z79899 Other long term (current) drug therapy: Secondary | ICD-10-CM | POA: Diagnosis not present

## 2018-10-12 DIAGNOSIS — E7800 Pure hypercholesterolemia, unspecified: Secondary | ICD-10-CM | POA: Diagnosis not present

## 2018-10-12 DIAGNOSIS — E559 Vitamin D deficiency, unspecified: Secondary | ICD-10-CM | POA: Diagnosis not present

## 2018-10-13 DIAGNOSIS — Z1211 Encounter for screening for malignant neoplasm of colon: Secondary | ICD-10-CM | POA: Diagnosis not present

## 2018-10-18 ENCOUNTER — Ambulatory Visit (INDEPENDENT_AMBULATORY_CARE_PROVIDER_SITE_OTHER): Payer: Medicare Other | Admitting: Psychiatry

## 2018-10-18 ENCOUNTER — Encounter (HOSPITAL_COMMUNITY): Payer: Self-pay | Admitting: Psychiatry

## 2018-10-18 DIAGNOSIS — F411 Generalized anxiety disorder: Secondary | ICD-10-CM | POA: Diagnosis not present

## 2018-10-18 MED ORDER — VENLAFAXINE HCL ER 150 MG PO CP24: 150.0000 mg | ORAL_CAPSULE | Freq: Every day | ORAL | 2 refills | Status: DC

## 2018-10-18 MED ORDER — BUSPIRONE HCL 15 MG PO TABS: 15.0000 mg | ORAL_TABLET | Freq: Two times a day (BID) | ORAL | 2 refills | Status: DC

## 2018-10-18 MED ORDER — ALPRAZOLAM 0.25 MG PO TABS: 0.2500 mg | ORAL_TABLET | Freq: Three times a day (TID) | ORAL | 2 refills | Status: DC

## 2018-10-18 NOTE — Progress Notes (Signed)
[facility] MD/PA/NP OP Progress Note  10/18/2018 11:25 AM Yesenia Reynolds  MRN:  [id number]  Chief Complaint:  Chief Complaint    Depression; Anxiety; Follow-up     HPI: This patient is a 77 year old single white female who livesIn a rest home. 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  The patient was referred by her oncologist for further treatment and assessment of severe anxiety.  The patient states that she has had no prior psychiatric assessment or treatment. She began to have endometrial bleeding in April 2016. She was found to have endometrial cancer and underwent hysterectomy. Following that she had 2 bouts of chemotherapy followed by radiation and eventually intravaginal radiation. She states that she finished all these treatments in October of 2016. She states that she did very well throughout the treatments and stayed calm and did not have significant problems with nausea and vomiting.  In November 2016 she began to have significant problems with anxiety. She didn't want anyone coming around and visiting her she felt extremely anxious being around people are going out to restaurants or to church. She is been a very gregarious person all her life and her personality totally changed. She denied being depressed sad or crying. She was shaking all the time and couldn't stay alone. She still can't stay alone. She stopped driving her car. She gave up things she enjoyed like going to her nephew's baseball games. She also could not watch anything frightening like police shows on TV because she would become extremely anxious. She is eating fairly well but lost a good deal of weight during her treatments. She is sleeping well and denies nightmares. She states that she had short-term memory loss shortly after treatment but this is gotten much better.  She was tried on Paxil but it was not helpful. Her doctor has her on Xanax 0.25 mg which does help but  she takes it as needed. Her gynecologist put her on Lamictal and she is up to 150 mg a day. She thinks this is helped more than anything else because she is less anxious than she was and is slowly starting to drive a little bit and be around a few people at a time. Her hand still shake at times. She would like to get out and do all the things she used to do such as drive on her own go to baseball games enjoy her friends and church but she is beginning to realize that this is going to be a slow process. She denies suicidal ideation or auditory or visual hallucinations  The patient returns after 3 months.  She still living in the [place] rest home but she is doing much better.  She is going out to church and out to lunch with friends.  She is mobile with her walker and sometimes uses a cane.  She does not feel quite ready to go back home with her old roommate but they are talking a lot and she thinks she will be ready soon.  She still owns a vehicle and feels like she is very close to being able to drive again.  Her mood is upbeat and she no longer feels anxious or has tremor.  Having ECT last summer has made a big difference for her.  She denies suicidal ideation   Visit Diagnosis:    ICD-10-CM   1. Generalized anxiety disorder F41.1 ALPRAZolam (XANAX) 0.25 MG tablet    Past  Psychiatric History: Hospitalization last summer for depression and anxiety with ECT treatments  Past Medical History:  Past Medical History:  Diagnosis Date  . Agoraphobia with panic attacks 08/07/2016  . Anxiety   . Arthritis   . Atrial fibrillation, currently in sinus rhythm   . Cancer [facility])    Endometrial  . Depression   . Endometrial cancer, FIGO stage IIIC ([place]) 08/08/2015  . Essential hypertension, benign   . GERD (gastroesophageal reflux disease)   . Hypothyroidism   . Mixed hyperlipidemia   . Palpitations   . Spinal stenosis   . Spinal stenosis of lumbar region at multiple levels 08/07/2016  . Type 2 diabetes  mellitus ([place])     Past Surgical History:  Procedure Laterality Date  . ABDOMINAL HYSTERECTOMY    . BREAST BIOPSY     benign  . CATARACT EXTRACTION W/PHACO Left 03/31/2017   Procedure: CATARACT EXTRACTION PHACO AND INTRAOCULAR LENS PLACEMENT (IOC);  Surgeon: Rutherford Guys, MD;  Location: [facility];  Service: Ophthalmology;  Laterality: Left;  CDE: 8.67  . CATARACT EXTRACTION W/PHACO Right 04/14/2017   Procedure: CATARACT EXTRACTION PHACO AND INTRAOCULAR LENS PLACEMENT RIGHT EYE;  Surgeon: Rutherford Guys, MD;  Location: [facility];  Service: Ophthalmology;  Laterality: Right;  CDE: 8.52  . LIPOMA EXCISION    . Multiple dental extractions    . PORT-A-CATH REMOVAL Right 09/02/2017   Procedure: MINOR REMOVAL PORT-A-CATH;  Surgeon: Aviva Signs, MD;  Location: [facility];  Service: General;  Laterality: Right;  . TONSILLECTOMY AND ADENOIDECTOMY    . TOTAL KNEE ARTHROPLASTY  08/04/2012   Procedure: TOTAL KNEE ARTHROPLASTY;  Surgeon: Gearlean Alf, MD;  Location: [facility];  Service: Orthopedics;  Laterality: Left;    Family Psychiatric History: none  Family History:  Family History  Problem Relation Age of Onset  . Stroke Mother   . Heart failure Mother   . Hypertension Father   . Coronary artery disease Father     Social History:  Social History   Socioeconomic History  . Marital status: Single    Spouse name: Not on file  . Number of children: Not on file  . Years of education: Not on file  . Highest education level: Not on file  Occupational History  . Not on file  Social Needs  . Financial resource strain: Not on file  . Food insecurity:    Worry: Not on file    Inability: Not on file  . Transportation needs:    Medical: Not on file    Non-medical: Not on file  Tobacco Use  . Smoking status: Never Smoker  . Smokeless tobacco: Never Used  Substance and Sexual Activity  . Alcohol use: No    Comment: 07/10/2016 per pt no  . Drug use: No    Comment: 8/3/2017per pt no   . Sexual  activity: Never  Lifestyle  . Physical activity:    Days per week: Not on file    Minutes per session: Not on file  . Stress: Not on file  Relationships  . Social connections:    Talks on phone: Not on file    Gets together: Not on file    Attends religious service: Not on file    Active member of club or organization: Not on file    Attends meetings of clubs or organizations: Not on file    Relationship status: Not on file  Other Topics Concern  . Not on file  Social History Narrative  .  Not on file    Allergies:  Allergies  Allergen Reactions  . Morphine And Related Other (See Comments)    Hypotension  . Other     Preservative in latanoprost causes redness, pt uses preservative free latanoprost   . Paxil [Paroxetine Hcl]     unknown  . Latex Rash    Metabolic Disorder Labs: No results found for: HGBA1C, MPG No results found for: PROLACTIN No results found for: CHOL, TRIG, HDL, CHOLHDL, VLDL, LDLCALC No results found for: TSH  Therapeutic Level Labs: No results found for: LITHIUM No results found for: VALPROATE No components found for:  CBMZ  Current Medications: Current Outpatient Medications  Medication Sig Dispense Refill  . acetaminophen (TYLENOL 8 HOUR ARTHRITIS PAIN) 650 MG CR tablet Take 650 mg by mouth every 8 (eight) hours as needed for pain.    Marland Kitchen ALPRAZolam (XANAX) 0.25 MG tablet Take 1 tablet (0.25 mg total) by mouth 3 (three) times daily. 90 tablet 2  . alum & mag hydroxide-simeth (MAALOX/MYLANTA) 200-200-20 MG/5ML suspension Take 30 mLs by mouth every 6 (six) hours as needed for indigestion or heartburn.    Marland Kitchen aspirin 81 MG tablet Take 81 mg by mouth at bedtime.     Marland Kitchen atorvastatin (LIPITOR) 10 MG tablet Take 10 mg by mouth every evening.     . busPIRone (BUSPAR) 15 MG tablet Take 1 tablet (15 mg total) by mouth 2 (two) times daily. 60 tablet 2  . calcium carbonate (TUMS - DOSED IN MG ELEMENTAL CALCIUM) 500 MG chewable tablet Chew 1 tablet by mouth  daily as needed for indigestion or heartburn.    Marland Kitchen ibuprofen (ADVIL,MOTRIN) 200 MG tablet Take 200 mg by mouth 2 (two) times daily as needed for moderate pain.     Marland Kitchen latanoprost (XALATAN) 0.005 % ophthalmic solution Place 1 drop into both eyes at bedtime.    Marland Kitchen levothyroxine (SYNTHROID, LEVOTHROID) 88 MCG tablet Take 88 mcg by mouth daily before breakfast.     . lisinopril (PRINIVIL,ZESTRIL) 10 MG tablet Take 10 mg by mouth every morning.     . loratadine (CLARITIN) 10 MG tablet Take 10 mg by mouth daily as needed for allergies.    . metoprolol succinate (TOPROL-XL) 25 MG 24 hr tablet take 1/2 tablet BY MOUTH every morning 3 tablet 0  . polyethylene glycol (MIRALAX / GLYCOLAX) packet Take 17 g by mouth daily as needed for mild constipation.     Marland Kitchen venlafaxine XR (EFFEXOR-XR) 150 MG 24 hr capsule Take 1 capsule (150 mg total) by mouth daily with breakfast. 30 capsule 2  . metFORMIN (GLUCOPHAGE) 500 MG tablet Take by mouth.     No current facility-administered medications for this visit.      Musculoskeletal: Strength & Muscle Tone: Somewhat decreased Gait & Station unsteady, uses a walker Patient leans: N/A  Psychiatric Specialty Exam: Review of Systems  Musculoskeletal: Positive for back pain.  Neurological: Positive for weakness.  All other systems reviewed and are negative.   Blood pressure (!) 156/78, pulse 95, height 5\' 1"  (1.549 m), weight 183 lb (83 kg), SpO2 97 %.Body mass index is 34.58 kg/m.  General Appearance: Casual, Neat and Well Groomed  Eye Contact:  Good  Speech:  Clear and Coherent  Volume:  Normal  Mood:  Euthymic  Affect:  Appropriate and Congruent  Thought Process:  Goal Directed  Orientation:  Full (Time, Place, and Person)  Thought Content: Logical   Suicidal Thoughts:  No  Homicidal Thoughts:  No  Memory:  Immediate;   Good Recent;   Good Remote;   Good  Judgement:  Good  Insight:  Fair  Psychomotor Activity:  Decreased  Concentration:  Concentration:  Good and Attention Span: Good  Recall:  Good  Fund of Knowledge: Good  Language: Good  Akathisia:  No  Handed:  Right  AIMS (if indicated): not done  Assets:  Communication Skills Desire for Improvement Resilience Social Support Talents/Skills  ADL's:  Intact  Cognition: WNL  Sleep:  Good   Screenings: GAD-7     Counselor from 04/20/2018 in [place] from 09/15/2016 in [place] [facility]  Total GAD-7 Score  14  10    PHQ2-9     Counselor from 04/20/2018 in [place] [facility]  PHQ-2 Total Score  5  PHQ-9 Total Score  14       Assessment and Plan: This patient is a 77 year old female with a history of depression and severe anxiety.  She has been doing so much better since she had ECT treatments last summer.  She will continue on Effexor XR 150 mg daily for depression, BuSpar 15 mg twice daily for anxiety and Xanax 0.25 mg 3 times daily also for anxiety.  She will return to see me in 3 months   [name], MD 10/18/2018, 11:25 AM

## 2018-10-25 ENCOUNTER — Other Ambulatory Visit: Payer: Self-pay | Admitting: Cardiology

## 2018-11-10 DIAGNOSIS — R920 Mammographic microcalcification found on diagnostic imaging of breast: Secondary | ICD-10-CM | POA: Diagnosis not present

## 2018-11-10 DIAGNOSIS — R921 Mammographic calcification found on diagnostic imaging of breast: Secondary | ICD-10-CM | POA: Diagnosis not present

## 2018-12-16 DIAGNOSIS — E119 Type 2 diabetes mellitus without complications: Secondary | ICD-10-CM | POA: Diagnosis not present

## 2018-12-16 DIAGNOSIS — K644 Residual hemorrhoidal skin tags: Secondary | ICD-10-CM | POA: Diagnosis not present

## 2018-12-16 DIAGNOSIS — K5790 Diverticulosis of intestine, part unspecified, without perforation or abscess without bleeding: Secondary | ICD-10-CM | POA: Diagnosis not present

## 2018-12-16 DIAGNOSIS — Z91040 Latex allergy status: Secondary | ICD-10-CM | POA: Diagnosis not present

## 2018-12-16 DIAGNOSIS — Z1211 Encounter for screening for malignant neoplasm of colon: Secondary | ICD-10-CM | POA: Diagnosis not present

## 2018-12-16 DIAGNOSIS — I10 Essential (primary) hypertension: Secondary | ICD-10-CM | POA: Diagnosis not present

## 2018-12-16 DIAGNOSIS — Z888 Allergy status to other drugs, medicaments and biological substances status: Secondary | ICD-10-CM | POA: Diagnosis not present

## 2018-12-16 DIAGNOSIS — F419 Anxiety disorder, unspecified: Secondary | ICD-10-CM | POA: Diagnosis not present

## 2018-12-16 DIAGNOSIS — Z886 Allergy status to analgesic agent status: Secondary | ICD-10-CM | POA: Diagnosis not present

## 2018-12-16 DIAGNOSIS — M1990 Unspecified osteoarthritis, unspecified site: Secondary | ICD-10-CM | POA: Diagnosis not present

## 2018-12-16 DIAGNOSIS — E039 Hypothyroidism, unspecified: Secondary | ICD-10-CM | POA: Diagnosis not present

## 2018-12-16 DIAGNOSIS — I4891 Unspecified atrial fibrillation: Secondary | ICD-10-CM | POA: Diagnosis not present

## 2018-12-16 DIAGNOSIS — K640 First degree hemorrhoids: Secondary | ICD-10-CM | POA: Diagnosis not present

## 2018-12-16 DIAGNOSIS — F329 Major depressive disorder, single episode, unspecified: Secondary | ICD-10-CM | POA: Diagnosis not present

## 2018-12-20 DIAGNOSIS — E1165 Type 2 diabetes mellitus with hyperglycemia: Secondary | ICD-10-CM | POA: Diagnosis not present

## 2018-12-20 DIAGNOSIS — R350 Frequency of micturition: Secondary | ICD-10-CM | POA: Diagnosis not present

## 2018-12-20 DIAGNOSIS — Z299 Encounter for prophylactic measures, unspecified: Secondary | ICD-10-CM | POA: Diagnosis not present

## 2018-12-20 DIAGNOSIS — N390 Urinary tract infection, site not specified: Secondary | ICD-10-CM | POA: Diagnosis not present

## 2018-12-20 DIAGNOSIS — Z789 Other specified health status: Secondary | ICD-10-CM | POA: Diagnosis not present

## 2018-12-20 DIAGNOSIS — Z6834 Body mass index (BMI) 34.0-34.9, adult: Secondary | ICD-10-CM | POA: Diagnosis not present

## 2018-12-20 DIAGNOSIS — I10 Essential (primary) hypertension: Secondary | ICD-10-CM | POA: Diagnosis not present

## 2018-12-27 ENCOUNTER — Other Ambulatory Visit: Payer: Self-pay | Admitting: Cardiology

## 2019-01-16 ENCOUNTER — Other Ambulatory Visit (HOSPITAL_COMMUNITY): Payer: Self-pay | Admitting: Psychiatry

## 2019-01-17 ENCOUNTER — Ambulatory Visit (HOSPITAL_COMMUNITY): Payer: Medicare Other | Admitting: Psychiatry

## 2019-01-18 ENCOUNTER — Other Ambulatory Visit (HOSPITAL_COMMUNITY): Payer: Self-pay | Admitting: Psychiatry

## 2019-01-18 DIAGNOSIS — H401131 Primary open-angle glaucoma, bilateral, mild stage: Secondary | ICD-10-CM | POA: Diagnosis not present

## 2019-01-18 DIAGNOSIS — E119 Type 2 diabetes mellitus without complications: Secondary | ICD-10-CM | POA: Diagnosis not present

## 2019-01-18 DIAGNOSIS — F411 Generalized anxiety disorder: Secondary | ICD-10-CM

## 2019-01-18 DIAGNOSIS — Z961 Presence of intraocular lens: Secondary | ICD-10-CM | POA: Diagnosis not present

## 2019-02-07 ENCOUNTER — Ambulatory Visit (INDEPENDENT_AMBULATORY_CARE_PROVIDER_SITE_OTHER): Payer: Medicare Other | Admitting: Psychiatry

## 2019-02-07 ENCOUNTER — Encounter (HOSPITAL_COMMUNITY): Payer: Self-pay | Admitting: Psychiatry

## 2019-02-07 DIAGNOSIS — F411 Generalized anxiety disorder: Secondary | ICD-10-CM

## 2019-02-07 MED ORDER — ALPRAZOLAM 0.25 MG PO TABS: 0.2500 mg | ORAL_TABLET | Freq: Three times a day (TID) | ORAL | 2 refills | Status: DC

## 2019-02-07 MED ORDER — BUSPIRONE HCL 15 MG PO TABS: 15.0000 mg | ORAL_TABLET | Freq: Two times a day (BID) | ORAL | 2 refills | Status: DC

## 2019-02-07 MED ORDER — VENLAFAXINE HCL ER 150 MG PO CP24: 150.0000 mg | ORAL_CAPSULE | Freq: Every day | ORAL | 2 refills | Status: DC

## 2019-02-07 NOTE — Progress Notes (Signed)
[facility] MD/PA/NP OP Progress Note  02/07/2019 11:42 AM Yesenia Reynolds  MRN:  [id number]  Chief Complaint:  Chief Complaint    Depression; Anxiety; Follow-up     HPI: This patient is a 78 year old single white female who livesIn a rest home. 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  The patient was referred by her oncologist for further treatment and assessment of severe anxiety.  The patient states that she has had no prior psychiatric assessment or treatment. She began to have endometrial bleeding in April 2016. She was found to have endometrial cancer and underwent hysterectomy. Following that she had 2 bouts of chemotherapy followed by radiation and eventually intravaginal radiation. She states that she finished all these treatments in October of 2016. She states that she did very well throughout the treatments and stayed calm and did not have significant problems with nausea and vomiting.  In November 2016 she began to have significant problems with anxiety. She didn't want anyone coming around and visiting her she felt extremely anxious being around people are going out to restaurants or to church. She is been a very gregarious person all her life and her personality totally changed. She denied being depressed sad or crying. She was shaking all the time and couldn't stay alone. She still can't stay alone. She stopped driving her car. She gave up things she enjoyed like going to her nephew's baseball games. She also could not watch anything frightening like police shows on TV because she would become extremely anxious. She is eating fairly well but lost a good deal of weight during her treatments. She is sleeping well and denies nightmares. She states that she had short-term memory loss shortly after treatment but this is gotten much better.  She was tried on Paxil but it was not helpful. Her doctor has her on Xanax 0.25 mg which does help but she  takes it as needed. Her gynecologist put her on Lamictal and she is up to 150 mg a day. She thinks this is helped more than anything else because she is less anxious than she was and is slowly starting to drive a little bit and be around a few people at a time. Her hand still shake at times. She would like to get out and do all the things she used to do such as drive on her own go to baseball games enjoy her friends and church but she is beginning to realize that this is going to be a slow process. She denies suicidal ideation or auditory or visual hallucinations  The patient returns after 3 months.  She is still walking with a walker but seems like she would walk just fine without it.  She is getting out now with her friends.  She is attending church every Sunday.  She is going to stay living at the rest home because she has food and friends provided.  Her mood is very upbeat today and she states her anxiety is under good control and she is sleeping well. Visit Diagnosis:    ICD-10-CM   1. Generalized anxiety disorder F41.1 ALPRAZolam (XANAX) 0.25 MG tablet    Past Psychiatric History: Hospitalization last summer for depression and anxiety with ECT treatments  Past Medical History:  Past Medical History:  Diagnosis Date  . Agoraphobia with panic attacks 08/07/2016  . Anxiety   . Arthritis   . Atrial fibrillation, currently in sinus rhythm   .  Cancer [facility])    Endometrial  . Depression   . Endometrial cancer, FIGO stage IIIC ([place]) 08/08/2015  . Essential hypertension, benign   . GERD (gastroesophageal reflux disease)   . Hypothyroidism   . Mixed hyperlipidemia   . Palpitations   . Spinal stenosis   . Spinal stenosis of lumbar region at multiple levels 08/07/2016  . Type 2 diabetes mellitus ([place])     Past Surgical History:  Procedure Laterality Date  . ABDOMINAL HYSTERECTOMY    . BREAST BIOPSY     benign  . CATARACT EXTRACTION W/PHACO Left 03/31/2017   Procedure: CATARACT EXTRACTION  PHACO AND INTRAOCULAR LENS PLACEMENT (IOC);  Surgeon: Rutherford Guys, MD;  Location: [facility];  Service: Ophthalmology;  Laterality: Left;  CDE: 8.67  . CATARACT EXTRACTION W/PHACO Right 04/14/2017   Procedure: CATARACT EXTRACTION PHACO AND INTRAOCULAR LENS PLACEMENT RIGHT EYE;  Surgeon: Rutherford Guys, MD;  Location: [facility];  Service: Ophthalmology;  Laterality: Right;  CDE: 8.52  . LIPOMA EXCISION    . Multiple dental extractions    . PORT-A-CATH REMOVAL Right 09/02/2017   Procedure: MINOR REMOVAL PORT-A-CATH;  Surgeon: Aviva Signs, MD;  Location: [facility];  Service: General;  Laterality: Right;  . TONSILLECTOMY AND ADENOIDECTOMY    . TOTAL KNEE ARTHROPLASTY  08/04/2012   Procedure: TOTAL KNEE ARTHROPLASTY;  Surgeon: Gearlean Alf, MD;  Location: [facility];  Service: Orthopedics;  Laterality: Left;    Family Psychiatric History: see below  Family History:  Family History  Problem Relation Age of Onset  . Stroke Mother   . Heart failure Mother   . Hypertension Father   . Coronary artery disease Father     Social History:  Social History   Socioeconomic History  . Marital status: Single    Spouse name: Not on file  . Number of children: Not on file  . Years of education: Not on file  . Highest education level: Not on file  Occupational History  . Not on file  Social Needs  . Financial resource strain: Not on file  . Food insecurity:    Worry: Not on file    Inability: Not on file  . Transportation needs:    Medical: Not on file    Non-medical: Not on file  Tobacco Use  . Smoking status: Never Smoker  . Smokeless tobacco: Never Used  Substance and Sexual Activity  . Alcohol use: No    Comment: 07/10/2016 per pt no  . Drug use: No    Comment: 8/3/2017per pt no   . Sexual activity: Never  Lifestyle  . Physical activity:    Days per week: Not on file    Minutes per session: Not on file  . Stress: Not on file  Relationships  . Social connections:    Talks on phone: Not on  file    Gets together: Not on file    Attends religious service: Not on file    Active member of club or organization: Not on file    Attends meetings of clubs or organizations: Not on file    Relationship status: Not on file  Other Topics Concern  . Not on file  Social History Narrative  . Not on file    Allergies:  Allergies  Allergen Reactions  . Morphine And Related Other (See Comments)    Hypotension  . Other     Preservative in latanoprost causes redness, pt uses preservative free latanoprost   . Paxil [Paroxetine Hcl]  unknown  . Latex Rash    Metabolic Disorder Labs: No results found for: HGBA1C, MPG No results found for: PROLACTIN No results found for: CHOL, TRIG, HDL, CHOLHDL, VLDL, LDLCALC No results found for: TSH  Therapeutic Level Labs: No results found for: LITHIUM No results found for: VALPROATE No components found for:  CBMZ  Current Medications: Current Outpatient Medications  Medication Sig Dispense Refill  . acetaminophen (TYLENOL 8 HOUR ARTHRITIS PAIN) 650 MG CR tablet Take 650 mg by mouth every 8 (eight) hours as needed for pain.    Marland Kitchen ALPRAZolam (XANAX) 0.25 MG tablet Take 1 tablet (0.25 mg total) by mouth 3 (three) times daily. 90 tablet 2  . alum & mag hydroxide-simeth (MAALOX/MYLANTA) 200-200-20 MG/5ML suspension Take 30 mLs by mouth every 6 (six) hours as needed for indigestion or heartburn.    Marland Kitchen aspirin 81 MG tablet Take 81 mg by mouth at bedtime.     Marland Kitchen atorvastatin (LIPITOR) 10 MG tablet Take 10 mg by mouth every evening.     . busPIRone (BUSPAR) 15 MG tablet Take 1 tablet (15 mg total) by mouth 2 (two) times daily. 60 tablet 2  . calcium carbonate (TUMS - DOSED IN MG ELEMENTAL CALCIUM) 500 MG chewable tablet Chew 1 tablet by mouth daily as needed for indigestion or heartburn.    Marland Kitchen ibuprofen (ADVIL,MOTRIN) 200 MG tablet Take 200 mg by mouth 2 (two) times daily as needed for moderate pain.     Marland Kitchen latanoprost (XALATAN) 0.005 % ophthalmic  solution Place 1 drop into both eyes at bedtime.    Marland Kitchen levothyroxine (SYNTHROID, LEVOTHROID) 88 MCG tablet Take 88 mcg by mouth daily before breakfast.     . lisinopril (PRINIVIL,ZESTRIL) 10 MG tablet Take 10 mg by mouth every morning.     . loratadine (CLARITIN) 10 MG tablet Take 10 mg by mouth daily as needed for allergies.    . metoprolol succinate (TOPROL-XL) 25 MG 24 hr tablet take 1/2 tablet BY MOUTH every morning 3 tablet 0  . metoprolol succinate (TOPROL-XL) 25 MG 24 hr tablet take 1/2 tablet BY MOUTH every morning 3 tablet 0  . polyethylene glycol (MIRALAX / GLYCOLAX) packet Take 17 g by mouth daily as needed for mild constipation.     Marland Kitchen venlafaxine XR (EFFEXOR-XR) 150 MG 24 hr capsule Take 1 capsule (150 mg total) by mouth daily with breakfast. 30 capsule 2  . metFORMIN (GLUCOPHAGE) 500 MG tablet Take by mouth.     No current facility-administered medications for this visit.      Musculoskeletal: Strength & Muscle Tone: within normal limits Gait & Station: normal Patient leans: N/A  Psychiatric Specialty Exam: Review of Systems  Musculoskeletal: Positive for joint pain.  All other systems reviewed and are negative.   Blood pressure (!) 150/78, pulse 95, height 5\' 1"  (1.549 m), weight 183 lb (83 kg), SpO2 96 %.Body mass index is 34.58 kg/m.  General Appearance: Casual, Neat and Well Groomed  Eye Contact:  Good  Speech:  Clear and Coherent  Volume:  Normal  Mood:  Euthymic  Affect:  Congruent  Thought Process:  Goal Directed  Orientation:  Full (Time, Place, and Person)  Thought Content: Rumination   Suicidal Thoughts:  No  Homicidal Thoughts:  No  Memory:  Immediate;   Good Recent;   Good Remote;   Good  Judgement:  Good  Insight:  Fair  Psychomotor Activity:  Normal  Concentration:  Concentration: Good and Attention Span: Good  Recall:  Good  Fund of Knowledge: Good  Language: Good  Akathisia:  No  Handed:  Right  AIMS (if indicated): not done  Assets:   Communication Skills Desire for Improvement Physical Health  ADL's:  Intact  Cognition: WNL  Sleep:  Good   Screenings: GAD-7     Counselor from 04/20/2018 in [place] from 09/15/2016 in [place] [facility]  Total GAD-7 Score  14  10    PHQ2-9     Counselor from 04/20/2018 in [place] [facility]  PHQ-2 Total Score  5  PHQ-9 Total Score  14       Assessment and Plan: This patient is a 78 year old female with a history of significant depression and anxiety following a bout of cancer.  She is doing much better now.  She will continue Effexor XR 150 mg daily for depression, BuSpar 15 mg twice daily for anxiety and Xanax 0.25 mg 3 times daily for anxiety.  She will return to see me in 3 months   [name], MD 02/07/2019, 11:42 AM

## 2019-02-09 DIAGNOSIS — Z299 Encounter for prophylactic measures, unspecified: Secondary | ICD-10-CM | POA: Diagnosis not present

## 2019-02-09 DIAGNOSIS — J069 Acute upper respiratory infection, unspecified: Secondary | ICD-10-CM | POA: Diagnosis not present

## 2019-02-09 DIAGNOSIS — E7800 Pure hypercholesterolemia, unspecified: Secondary | ICD-10-CM | POA: Diagnosis not present

## 2019-02-09 DIAGNOSIS — I10 Essential (primary) hypertension: Secondary | ICD-10-CM | POA: Diagnosis not present

## 2019-02-09 DIAGNOSIS — Z6834 Body mass index (BMI) 34.0-34.9, adult: Secondary | ICD-10-CM | POA: Diagnosis not present

## 2019-02-16 DIAGNOSIS — I10 Essential (primary) hypertension: Secondary | ICD-10-CM | POA: Diagnosis not present

## 2019-02-16 DIAGNOSIS — Z299 Encounter for prophylactic measures, unspecified: Secondary | ICD-10-CM | POA: Diagnosis not present

## 2019-02-16 DIAGNOSIS — E1165 Type 2 diabetes mellitus with hyperglycemia: Secondary | ICD-10-CM | POA: Diagnosis not present

## 2019-02-16 DIAGNOSIS — J069 Acute upper respiratory infection, unspecified: Secondary | ICD-10-CM | POA: Diagnosis not present

## 2019-02-21 ENCOUNTER — Other Ambulatory Visit (HOSPITAL_COMMUNITY): Payer: Self-pay

## 2019-02-28 ENCOUNTER — Ambulatory Visit (HOSPITAL_COMMUNITY): Payer: Self-pay | Admitting: Hematology

## 2019-04-13 DIAGNOSIS — N390 Urinary tract infection, site not specified: Secondary | ICD-10-CM | POA: Diagnosis not present

## 2019-04-13 DIAGNOSIS — R3911 Hesitancy of micturition: Secondary | ICD-10-CM | POA: Diagnosis not present

## 2019-04-13 DIAGNOSIS — E1165 Type 2 diabetes mellitus with hyperglycemia: Secondary | ICD-10-CM | POA: Diagnosis not present

## 2019-04-13 DIAGNOSIS — Z6834 Body mass index (BMI) 34.0-34.9, adult: Secondary | ICD-10-CM | POA: Diagnosis not present

## 2019-04-13 DIAGNOSIS — Z299 Encounter for prophylactic measures, unspecified: Secondary | ICD-10-CM | POA: Diagnosis not present

## 2019-04-13 DIAGNOSIS — I10 Essential (primary) hypertension: Secondary | ICD-10-CM | POA: Diagnosis not present

## 2019-05-09 ENCOUNTER — Other Ambulatory Visit: Payer: Self-pay

## 2019-05-09 ENCOUNTER — Ambulatory Visit (INDEPENDENT_AMBULATORY_CARE_PROVIDER_SITE_OTHER): Payer: Medicare Other | Admitting: Psychiatry

## 2019-05-09 ENCOUNTER — Encounter (HOSPITAL_COMMUNITY): Payer: Self-pay | Admitting: Psychiatry

## 2019-05-09 DIAGNOSIS — F411 Generalized anxiety disorder: Secondary | ICD-10-CM | POA: Diagnosis not present

## 2019-05-09 MED ORDER — ALPRAZOLAM 0.25 MG PO TABS: 0.2500 mg | ORAL_TABLET | Freq: Three times a day (TID) | ORAL | 2 refills | Status: DC

## 2019-05-09 MED ORDER — VENLAFAXINE HCL ER 150 MG PO CP24: 150.0000 mg | ORAL_CAPSULE | Freq: Every day | ORAL | 2 refills | Status: DC

## 2019-05-09 MED ORDER — BUSPIRONE HCL 15 MG PO TABS: 15.0000 mg | ORAL_TABLET | Freq: Two times a day (BID) | ORAL | 2 refills | Status: DC

## 2019-05-09 NOTE — Progress Notes (Signed)
[place] MD/PA/NP OP Progress Note  05/09/2019 11:46 AM Yesenia Reynolds  MRN:  [id number]  Chief Complaint:  Chief Complaint    Depression; Anxiety; Follow-up     HPI: This patient is a 78 year old single white female who livesIn a rest home. 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  The patient was referred by her oncologist for further treatment and assessment of severe anxiety.  The patient states that she has had no prior psychiatric assessment or treatment. She began to have endometrial bleeding in April 2016. She was found to have endometrial cancer and underwent hysterectomy. Following that she had 2 bouts of chemotherapy followed by radiation and eventually intravaginal radiation. She states that she finished all these treatments in October of 2016. She states that she did very well throughout the treatments and stayed calm and did not have significant problems with nausea and vomiting.  In November 2016 she began to have significant problems with anxiety. She didn't want anyone coming around and visiting her she felt extremely anxious being around people are going out to restaurants or to church. She is been a very gregarious person all her life and her personality totally changed. She denied being depressed sad or crying. She was shaking all the time and couldn't stay alone. She still can't stay alone. She stopped driving her car. She gave up things she enjoyed like going to her nephew's baseball games. She also could not watch anything frightening like police shows on TV because she would become extremely anxious. She is eating fairly well but lost a good deal of weight during her treatments. She is sleeping well and denies nightmares. She states that she had short-term memory loss shortly after treatment but this is gotten much better.  She was tried on Paxil but it was not helpful. Her doctor has her on Xanax 0.25 mg which does help but she  takes it as needed. Her gynecologist put her on Lamictal and she is up to 150 mg a day. She thinks this is helped more than anything else because she is less anxious than she was and is slowly starting to drive a little bit and be around a few people at a time. Her hand still shake at times. She would like to get out and do all the things she used to do such as drive on her own go to baseball games enjoy her friends and church but she is beginning to realize that this is going to be a slow process. She denies suicidal ideation or auditory or visual hallucinations  The patient returns for follow-up after 3 months.  She is assessed via telephone to the coronavirus pandemic.  She states that her rest home has been pretty locked down for the last few months because of the pandemic people are not allowed to visit.  She is finally able to have one visitor at a time and they can stay outside and talk.  She has been keeping up with a lot of her friends and family via telephone.  She is allowed to talk to some of the other residents and she is not feeling terribly isolated.  She states she is surprised at how well her mood has held up during the whole situation.  She is sleeping and eating well walking around with her walker and denies any current symptoms of depression or severe anxiety.  Before the pandemic it she was starting to drive  again.  She is eventually planning to go back to the condo with her roommate. Visit Diagnosis:    ICD-10-CM   1. Generalized anxiety disorder F41.1 ALPRAZolam (XANAX) 0.25 MG tablet    Past Psychiatric History: Hospitalization last summer for depression and anxiety with ECT treatments  Past Medical History:  Past Medical History:  Diagnosis Date  . Agoraphobia with panic attacks 08/07/2016  . Anxiety   . Arthritis   . Atrial fibrillation, currently in sinus rhythm   . Cancer [facility])    Endometrial  . Depression   . Endometrial cancer, FIGO stage IIIC ([place]) 08/08/2015  .  Essential hypertension, benign   . GERD (gastroesophageal reflux disease)   . Hypothyroidism   . Mixed hyperlipidemia   . Palpitations   . Spinal stenosis   . Spinal stenosis of lumbar region at multiple levels 08/07/2016  . Type 2 diabetes mellitus ([place])     Past Surgical History:  Procedure Laterality Date  . ABDOMINAL HYSTERECTOMY    . BREAST BIOPSY     benign  . CATARACT EXTRACTION W/PHACO Left 03/31/2017   Procedure: CATARACT EXTRACTION PHACO AND INTRAOCULAR LENS PLACEMENT (IOC);  Surgeon: Rutherford Guys, MD;  Location: [facility];  Service: Ophthalmology;  Laterality: Left;  CDE: 8.67  . CATARACT EXTRACTION W/PHACO Right 04/14/2017   Procedure: CATARACT EXTRACTION PHACO AND INTRAOCULAR LENS PLACEMENT RIGHT EYE;  Surgeon: Rutherford Guys, MD;  Location: [facility];  Service: Ophthalmology;  Laterality: Right;  CDE: 8.52  . LIPOMA EXCISION    . Multiple dental extractions    . PORT-A-CATH REMOVAL Right 09/02/2017   Procedure: MINOR REMOVAL PORT-A-CATH;  Surgeon: Aviva Signs, MD;  Location: [facility];  Service: General;  Laterality: Right;  . TONSILLECTOMY AND ADENOIDECTOMY    . TOTAL KNEE ARTHROPLASTY  08/04/2012   Procedure: TOTAL KNEE ARTHROPLASTY;  Surgeon: Gearlean Alf, MD;  Location: [facility];  Service: Orthopedics;  Laterality: Left;    Family Psychiatric History: none  Family History:  Family History  Problem Relation Age of Onset  . Stroke Mother   . Heart failure Mother   . Hypertension Father   . Coronary artery disease Father     Social History:  Social History   Socioeconomic History  . Marital status: Single    Spouse name: Not on file  . Number of children: Not on file  . Years of education: Not on file  . Highest education level: Not on file  Occupational History  . Not on file  Social Needs  . Financial resource strain: Not on file  . Food insecurity:    Worry: Not on file    Inability: Not on file  . Transportation needs:    Medical: Not on file     Non-medical: Not on file  Tobacco Use  . Smoking status: Never Smoker  . Smokeless tobacco: Never Used  Substance and Sexual Activity  . Alcohol use: No    Comment: 07/10/2016 per pt no  . Drug use: No    Comment: 8/3/2017per pt no   . Sexual activity: Never  Lifestyle  . Physical activity:    Days per week: Not on file    Minutes per session: Not on file  . Stress: Not on file  Relationships  . Social connections:    Talks on phone: Not on file    Gets together: Not on file    Attends religious service: Not on file    Active member of club or organization: Not  on file    Attends meetings of clubs or organizations: Not on file    Relationship status: Not on file  Other Topics Concern  . Not on file  Social History Narrative  . Not on file    Allergies:  Allergies  Allergen Reactions  . Morphine And Related Other (See Comments)    Hypotension  . Other     Preservative in latanoprost causes redness, pt uses preservative free latanoprost   . Paxil [Paroxetine Hcl]     unknown  . Latex Rash    Metabolic Disorder Labs: No results found for: HGBA1C, MPG No results found for: PROLACTIN No results found for: CHOL, TRIG, HDL, CHOLHDL, VLDL, LDLCALC No results found for: TSH  Therapeutic Level Labs: No results found for: LITHIUM No results found for: VALPROATE No components found for:  CBMZ  Current Medications: Current Outpatient Medications  Medication Sig Dispense Refill  . acetaminophen (TYLENOL 8 HOUR ARTHRITIS PAIN) 650 MG CR tablet Take 650 mg by mouth every 8 (eight) hours as needed for pain.    Marland Kitchen ALPRAZolam (XANAX) 0.25 MG tablet Take 1 tablet (0.25 mg total) by mouth 3 (three) times daily. 90 tablet 2  . alum & mag hydroxide-simeth (MAALOX/MYLANTA) 200-200-20 MG/5ML suspension Take 30 mLs by mouth every 6 (six) hours as needed for indigestion or heartburn.    Marland Kitchen aspirin 81 MG tablet Take 81 mg by mouth at bedtime.     Marland Kitchen atorvastatin (LIPITOR) 10 MG tablet Take  10 mg by mouth every evening.     . busPIRone (BUSPAR) 15 MG tablet Take 1 tablet (15 mg total) by mouth 2 (two) times daily. 60 tablet 2  . calcium carbonate (TUMS - DOSED IN MG ELEMENTAL CALCIUM) 500 MG chewable tablet Chew 1 tablet by mouth daily as needed for indigestion or heartburn.    Marland Kitchen ibuprofen (ADVIL,MOTRIN) 200 MG tablet Take 200 mg by mouth 2 (two) times daily as needed for moderate pain.     Marland Kitchen latanoprost (XALATAN) 0.005 % ophthalmic solution Place 1 drop into both eyes at bedtime.    Marland Kitchen levothyroxine (SYNTHROID, LEVOTHROID) 88 MCG tablet Take 88 mcg by mouth daily before breakfast.     . lisinopril (PRINIVIL,ZESTRIL) 10 MG tablet Take 10 mg by mouth every morning.     . loratadine (CLARITIN) 10 MG tablet Take 10 mg by mouth daily as needed for allergies.    . metFORMIN (GLUCOPHAGE) 500 MG tablet Take by mouth.    . metoprolol succinate (TOPROL-XL) 25 MG 24 hr tablet take 1/2 tablet BY MOUTH every morning 3 tablet 0  . metoprolol succinate (TOPROL-XL) 25 MG 24 hr tablet take 1/2 tablet BY MOUTH every morning 3 tablet 0  . polyethylene glycol (MIRALAX / GLYCOLAX) packet Take 17 g by mouth daily as needed for mild constipation.     Marland Kitchen venlafaxine XR (EFFEXOR-XR) 150 MG 24 hr capsule Take 1 capsule (150 mg total) by mouth daily with breakfast. 30 capsule 2   No current facility-administered medications for this visit.      Musculoskeletal: Strength & Muscle Tone: decreased Gait & Station: unsteady Patient leans: N/A  Psychiatric Specialty Exam: Review of Systems  Musculoskeletal: Positive for back pain and joint pain.  All other systems reviewed and are negative.   There were no vitals taken for this visit.There is no height or weight on file to calculate BMI.  General Appearance: NA  Eye Contact:  NA  Speech:  Clear and Coherent  Volume:  Normal  Mood:  Euthymic  Affect:  Congruent  Thought Process:  Goal Directed  Orientation:  Full (Time, Place, and Person)  Thought  Content: WDL   Suicidal Thoughts:  No  Homicidal Thoughts:  No  Memory:  Immediate;   Good Recent;   Good Remote;   Good  Judgement:  Good  Insight:  Fair  Psychomotor Activity:  Decreased  Concentration:  Concentration: Good and Attention Span: Good  Recall:  Good  Fund of Knowledge: Good  Language: Good  Akathisia:  No  Handed:  Right  AIMS (if indicated): not done  Assets:  Communication Skills Desire for Improvement Physical Health Resilience Social Support Talents/Skills  ADL's:  Intact  Cognition: WNL  Sleep:  Good   Screenings: GAD-7     Counselor from 04/20/2018 in [place] from 09/15/2016 in [place] [facility]  Total GAD-7 Score  14  10    PHQ2-9     Counselor from 04/20/2018 in [place] [facility]  PHQ-2 Total Score  5  PHQ-9 Total Score  14       Assessment and Plan: This patient is a 79 year old female with a history of severe anxiety and depression.  She has been doing much better since she had ECT last summer.  She will continue Xanax 0.25 mg 3 times daily for anxiety, BuSpar 15 mg twice daily for anxiety and Effexor XR 150 mg daily for depression.  She will return to see me in 3 months   [name], MD 05/09/2019, 11:46 AM

## 2019-05-24 DIAGNOSIS — H401131 Primary open-angle glaucoma, bilateral, mild stage: Secondary | ICD-10-CM | POA: Diagnosis not present

## 2019-07-25 DIAGNOSIS — I10 Essential (primary) hypertension: Secondary | ICD-10-CM | POA: Diagnosis not present

## 2019-07-25 DIAGNOSIS — Z6834 Body mass index (BMI) 34.0-34.9, adult: Secondary | ICD-10-CM | POA: Diagnosis not present

## 2019-07-25 DIAGNOSIS — Z299 Encounter for prophylactic measures, unspecified: Secondary | ICD-10-CM | POA: Diagnosis not present

## 2019-07-25 DIAGNOSIS — E1165 Type 2 diabetes mellitus with hyperglycemia: Secondary | ICD-10-CM | POA: Diagnosis not present

## 2019-07-25 DIAGNOSIS — E785 Hyperlipidemia, unspecified: Secondary | ICD-10-CM | POA: Diagnosis not present

## 2019-08-08 ENCOUNTER — Encounter (HOSPITAL_COMMUNITY): Payer: Self-pay | Admitting: Psychiatry

## 2019-08-08 ENCOUNTER — Other Ambulatory Visit: Payer: Self-pay

## 2019-08-08 ENCOUNTER — Ambulatory Visit (INDEPENDENT_AMBULATORY_CARE_PROVIDER_SITE_OTHER): Payer: Medicare Other | Admitting: Psychiatry

## 2019-08-08 DIAGNOSIS — F411 Generalized anxiety disorder: Secondary | ICD-10-CM

## 2019-08-08 MED ORDER — BUSPIRONE HCL 15 MG PO TABS: 15.0000 mg | ORAL_TABLET | Freq: Two times a day (BID) | ORAL | 2 refills | Status: DC

## 2019-08-08 MED ORDER — ALPRAZOLAM 0.25 MG PO TABS: 0.2500 mg | ORAL_TABLET | Freq: Three times a day (TID) | ORAL | 2 refills | Status: DC

## 2019-08-08 MED ORDER — VENLAFAXINE HCL ER 150 MG PO CP24: 150.0000 mg | ORAL_CAPSULE | Freq: Every day | ORAL | 2 refills | Status: DC

## 2019-08-08 NOTE — Progress Notes (Signed)
Virtual Visit via Telephone Note  I connected with Yesenia Reynolds on 08/08/19 at 11:20 AM EDT by telephone and verified that I am speaking with the correct person using two identifiers.   I discussed the limitations, risks, security and privacy concerns of performing an evaluation and management service by telephone and the availability of in person appointments. I also discussed with the patient that there may be a patient responsible charge related to this service. The patient expressed understanding and agreed to proceed.     I discussed the assessment and treatment plan with the patient. The patient was provided an opportunity to ask questions and all were answered. The patient agreed with the plan and demonstrated an understanding of the instructions.   The patient was advised to call back or seek an in-person evaluation if the symptoms worsen or if the condition fails to improve as anticipated.  I provided 15 minutes of non-face-to-face time during this encounter.   [name], MD  [facility] MD/PA/NP OP Progress Note  08/08/2019 10:56 AM Yesenia Reynolds  MRN:  [id number]  Chief Complaint:  Chief Complaint    Depression; Anxiety; Follow-up     HPI: This patient is a 78 year old single white female who livesIn a rest home. 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  The patient was referred by her oncologist for further treatment and assessment of severe anxiety.  The patient states that she has had no prior psychiatric assessment or treatment. She began to have endometrial bleeding in April 2016. She was found to have endometrial cancer and underwent hysterectomy. Following that she had 2 bouts of chemotherapy followed by radiation and eventually intravaginal radiation. She states that she finished all these treatments in October of 2016. She states that she did very well throughout the treatments and stayed calm and did not have  significant problems with nausea and vomiting.  In November 2016 she began to have significant problems with anxiety. She didn't want anyone coming around and visiting her she felt extremely anxious being around people are going out to restaurants or to church. She is been a very gregarious person all her life and her personality totally changed. She denied being depressed sad or crying. She was shaking all the time and couldn't stay alone. She still can't stay alone. She stopped driving her car. She gave up things she enjoyed like going to her nephew's baseball games. She also could not watch anything frightening like police shows on TV because she would become extremely anxious. She is eating fairly well but lost a good deal of weight during her treatments. She is sleeping well and denies nightmares. She states that she had short-term memory loss shortly after treatment but this is gotten much better.  She was tried on Paxil but it was not helpful. Her doctor has her on Xanax 0.25 mg which does help but she takes it as needed. Her gynecologist put her on Lamictal and she is up to 150 mg a day. She thinks this is helped more than anything else because she is less anxious than she was and is slowly starting to drive a little bit and be around a few people at a time. Her hand still shake at times. She would like to get out and do all the things she used to do such as drive on her own go to baseball games enjoy her friends and church but she is beginning  to realize that this is going to be a slow process. She denies suicidal ideation or auditory or visual hallucinations  The patient returns after 3 months and has again assessed via phone to the coronavirus pandemic.  She states that she is doing very well at the rest home.  She has gone out in the car a couple of times but mostly just stays at the rest home.  She is able to socialize with other people there and they have not had any coronavirus cases.  She  states that her mood is good and upbeat.  She does not do well on the news and tries to think positive.  She denies any shaking or anxiety.  She is sleeping and eating well.  She feels as if her medications are at a good level. Visit Diagnosis:    ICD-10-CM   1. Generalized anxiety disorder  F41.1 ALPRAZolam (XANAX) 0.25 MG tablet    Past Psychiatric History: Hospitalization last summer for depression and anxiety with ECT treatments  Past Medical History:  Past Medical History:  Diagnosis Date  . Agoraphobia with panic attacks 08/07/2016  . Anxiety   . Arthritis   . Atrial fibrillation, currently in sinus rhythm   . Cancer [facility])    Endometrial  . Depression   . Endometrial cancer, FIGO stage IIIC ([place]) 08/08/2015  . Essential hypertension, benign   . GERD (gastroesophageal reflux disease)   . Hypothyroidism   . Mixed hyperlipidemia   . Palpitations   . Spinal stenosis   . Spinal stenosis of lumbar region at multiple levels 08/07/2016  . Type 2 diabetes mellitus ([place])     Past Surgical History:  Procedure Laterality Date  . ABDOMINAL HYSTERECTOMY    . BREAST BIOPSY     benign  . CATARACT EXTRACTION W/PHACO Left 03/31/2017   Procedure: CATARACT EXTRACTION PHACO AND INTRAOCULAR LENS PLACEMENT (IOC);  Surgeon: Rutherford Guys, MD;  Location: [facility];  Service: Ophthalmology;  Laterality: Left;  CDE: 8.67  . CATARACT EXTRACTION W/PHACO Right 04/14/2017   Procedure: CATARACT EXTRACTION PHACO AND INTRAOCULAR LENS PLACEMENT RIGHT EYE;  Surgeon: Rutherford Guys, MD;  Location: [facility];  Service: Ophthalmology;  Laterality: Right;  CDE: 8.52  . LIPOMA EXCISION    . Multiple dental extractions    . PORT-A-CATH REMOVAL Right 09/02/2017   Procedure: MINOR REMOVAL PORT-A-CATH;  Surgeon: Aviva Signs, MD;  Location: [facility];  Service: General;  Laterality: Right;  . TONSILLECTOMY AND ADENOIDECTOMY    . TOTAL KNEE ARTHROPLASTY  08/04/2012   Procedure: TOTAL KNEE ARTHROPLASTY;  Surgeon: Gearlean Alf,  MD;  Location: [facility];  Service: Orthopedics;  Laterality: Left;    Family Psychiatric History: see below  Family History:  Family History  Problem Relation Age of Onset  . Stroke Mother   . Heart failure Mother   . Hypertension Father   . Coronary artery disease Father     Social History:  Social History   Socioeconomic History  . Marital status: Single    Spouse name: Not on file  . Number of children: Not on file  . Years of education: Not on file  . Highest education level: Not on file  Occupational History  . Not on file  Social Needs  . Financial resource strain: Not on file  . Food insecurity    Worry: Not on file    Inability: Not on file  . Transportation needs    Medical: Not on file    Non-medical: Not  on file  Tobacco Use  . Smoking status: Never Smoker  . Smokeless tobacco: Never Used  Substance and Sexual Activity  . Alcohol use: No    Comment: 07/10/2016 per pt no  . Drug use: No    Comment: 8/3/2017per pt no   . Sexual activity: Never  Lifestyle  . Physical activity    Days per week: Not on file    Minutes per session: Not on file  . Stress: Not on file  Relationships  . Social Herbalist on phone: Not on file    Gets together: Not on file    Attends religious service: Not on file    Active member of club or organization: Not on file    Attends meetings of clubs or organizations: Not on file    Relationship status: Not on file  Other Topics Concern  . Not on file  Social History Narrative  . Not on file    Allergies:  Allergies  Allergen Reactions  . Morphine And Related Other (See Comments)    Hypotension  . Other     Preservative in latanoprost causes redness, pt uses preservative free latanoprost   . Paxil [Paroxetine Hcl]     unknown  . Latex Rash    Metabolic Disorder Labs: No results found for: HGBA1C, MPG No results found for: PROLACTIN No results found for: CHOL, TRIG, HDL, CHOLHDL, VLDL, LDLCALC No results  found for: TSH  Therapeutic Level Labs: No results found for: LITHIUM No results found for: VALPROATE No components found for:  CBMZ  Current Medications: Current Outpatient Medications  Medication Sig Dispense Refill  . acetaminophen (TYLENOL 8 HOUR ARTHRITIS PAIN) 650 MG CR tablet Take 650 mg by mouth every 8 (eight) hours as needed for pain.    Marland Kitchen ALPRAZolam (XANAX) 0.25 MG tablet Take 1 tablet (0.25 mg total) by mouth 3 (three) times daily. 90 tablet 2  . alum & mag hydroxide-simeth (MAALOX/MYLANTA) 200-200-20 MG/5ML suspension Take 30 mLs by mouth every 6 (six) hours as needed for indigestion or heartburn.    Marland Kitchen aspirin 81 MG tablet Take 81 mg by mouth at bedtime.     Marland Kitchen atorvastatin (LIPITOR) 10 MG tablet Take 10 mg by mouth every evening.     . busPIRone (BUSPAR) 15 MG tablet Take 1 tablet (15 mg total) by mouth 2 (two) times daily. 60 tablet 2  . calcium carbonate (TUMS - DOSED IN MG ELEMENTAL CALCIUM) 500 MG chewable tablet Chew 1 tablet by mouth daily as needed for indigestion or heartburn.    Marland Kitchen ibuprofen (ADVIL,MOTRIN) 200 MG tablet Take 200 mg by mouth 2 (two) times daily as needed for moderate pain.     Marland Kitchen latanoprost (XALATAN) 0.005 % ophthalmic solution Place 1 drop into both eyes at bedtime.    Marland Kitchen levothyroxine (SYNTHROID, LEVOTHROID) 88 MCG tablet Take 88 mcg by mouth daily before breakfast.     . lisinopril (PRINIVIL,ZESTRIL) 10 MG tablet Take 10 mg by mouth every morning.     . loratadine (CLARITIN) 10 MG tablet Take 10 mg by mouth daily as needed for allergies.    . metFORMIN (GLUCOPHAGE) 500 MG tablet Take by mouth.    . metoprolol succinate (TOPROL-XL) 25 MG 24 hr tablet take 1/2 tablet BY MOUTH every morning 3 tablet 0  . metoprolol succinate (TOPROL-XL) 25 MG 24 hr tablet take 1/2 tablet BY MOUTH every morning 3 tablet 0  . polyethylene glycol (MIRALAX / GLYCOLAX) packet Take  17 g by mouth daily as needed for mild constipation.     Marland Kitchen venlafaxine XR (EFFEXOR-XR) 150 MG 24  hr capsule Take 1 capsule (150 mg total) by mouth daily with breakfast. 30 capsule 2   No current facility-administered medications for this visit.      Musculoskeletal: Strength & Muscle Tone: decreased Gait & Station: unsteady Patient leans: N/A  Psychiatric Specialty Exam: Review of Systems  Musculoskeletal: Positive for joint pain.  Neurological: Positive for weakness.  All other systems reviewed and are negative.   There were no vitals taken for this visit.There is no height or weight on file to calculate BMI.  General Appearance: NA  Eye Contact:  NA  Speech:  Clear and Coherent  Volume:  Normal  Mood:  Euthymic  Affect:  NA  Thought Process:  Goal Directed  Orientation:  Full (Time, Place, and Person)  Thought Content: WDL   Suicidal Thoughts:  No  Homicidal Thoughts:  No  Memory:  Immediate;   Good Recent;   Good Remote;   Good  Judgement:  Good  Insight:  Good  Psychomotor Activity:  Decreased  Concentration:  Concentration: Good and Attention Span: Good  Recall:  Good  Fund of Knowledge: Good  Language: Good  Akathisia:  No  Handed:  Right  AIMS (if indicated): not done  Assets:  Communication Skills Desire for Improvement Resilience Social Support Talents/Skills  ADL's:  Intact  Cognition: WNL  Sleep:  Good   Screenings: GAD-7     Counselor from 04/20/2018 in [place] from 09/15/2016 in [place] [facility]  Total GAD-7 Score  14  10    PHQ2-9     Counselor from 04/20/2018 in [place] [facility]  PHQ-2 Total Score  5  PHQ-9 Total Score  14       Assessment and Plan: This patient is a 78 year old female with a history of severe anxiety and depression.  She continues to do well on her current regimen.  She will continue Xanax 0.25 mg 2 times daily for anxiety, BuSpar 15 mg twice daily for anxiety and Effexor XR 150 mg daily  for depression.  She will return to see me in 3 months   [name], MD 08/08/2019, 10:56 AM

## 2019-08-08 NOTE — Progress Notes (Deleted)
[place] MD/PA/NP OP Progress Note  08/08/2019 10:56 AM Yesenia Reynolds  MRN:  [id number]  Chief Complaint:  Chief Complaint    Depression; Anxiety; Follow-up     HPI: *** Visit Diagnosis:    ICD-10-CM   1. Generalized anxiety disorder  F41.1 ALPRAZolam (XANAX) 0.25 MG tablet    Past Psychiatric History: ***  Past Medical History:  Past Medical History:  Diagnosis Date  . Agoraphobia with panic attacks 08/07/2016  . Anxiety   . Arthritis   . Atrial fibrillation, currently in sinus rhythm   . Cancer [facility])    Endometrial  . Depression   . Endometrial cancer, FIGO stage IIIC ([place]) 08/08/2015  . Essential hypertension, benign   . GERD (gastroesophageal reflux disease)   . Hypothyroidism   . Mixed hyperlipidemia   . Palpitations   . Spinal stenosis   . Spinal stenosis of lumbar region at multiple levels 08/07/2016  . Type 2 diabetes mellitus ([place])     Past Surgical History:  Procedure Laterality Date  . ABDOMINAL HYSTERECTOMY    . BREAST BIOPSY     benign  . CATARACT EXTRACTION W/PHACO Left 03/31/2017   Procedure: CATARACT EXTRACTION PHACO AND INTRAOCULAR LENS PLACEMENT (IOC);  Surgeon: Rutherford Guys, MD;  Location: [facility];  Service: Ophthalmology;  Laterality: Left;  CDE: 8.67  . CATARACT EXTRACTION W/PHACO Right 04/14/2017   Procedure: CATARACT EXTRACTION PHACO AND INTRAOCULAR LENS PLACEMENT RIGHT EYE;  Surgeon: Rutherford Guys, MD;  Location: [facility];  Service: Ophthalmology;  Laterality: Right;  CDE: 8.52  . LIPOMA EXCISION    . Multiple dental extractions    . PORT-A-CATH REMOVAL Right 09/02/2017   Procedure: MINOR REMOVAL PORT-A-CATH;  Surgeon: Aviva Signs, MD;  Location: [facility];  Service: General;  Laterality: Right;  . TONSILLECTOMY AND ADENOIDECTOMY    . TOTAL KNEE ARTHROPLASTY  08/04/2012   Procedure: TOTAL KNEE ARTHROPLASTY;  Surgeon: Gearlean Alf, MD;  Location: [facility];  Service: Orthopedics;  Laterality: Left;    Family Psychiatric History: ***  Family History:   Family History  Problem Relation Age of Onset  . Stroke Mother   . Heart failure Mother   . Hypertension Father   . Coronary artery disease Father     Social History:  Social History   Socioeconomic History  . Marital status: Single    Spouse name: Not on file  . Number of children: Not on file  . Years of education: Not on file  . Highest education level: Not on file  Occupational History  . Not on file  Social Needs  . Financial resource strain: Not on file  . Food insecurity    Worry: Not on file    Inability: Not on file  . Transportation needs    Medical: Not on file    Non-medical: Not on file  Tobacco Use  . Smoking status: Never Smoker  . Smokeless tobacco: Never Used  Substance and Sexual Activity  . Alcohol use: No    Comment: 07/10/2016 per pt no  . Drug use: No    Comment: 8/3/2017per pt no   . Sexual activity: Never  Lifestyle  . Physical activity    Days per week: Not on file    Minutes per session: Not on file  . Stress: Not on file  Relationships  . Social Herbalist on phone: Not on file    Gets together: Not on file    Attends religious service: Not on file  Active member of club or organization: Not on file    Attends meetings of clubs or organizations: Not on file    Relationship status: Not on file  Other Topics Concern  . Not on file  Social History Narrative  . Not on file    Allergies:  Allergies  Allergen Reactions  . Morphine And Related Other (See Comments)    Hypotension  . Other     Preservative in latanoprost causes redness, pt uses preservative free latanoprost   . Paxil [Paroxetine Hcl]     unknown  . Latex Rash    Metabolic Disorder Labs: No results found for: HGBA1C, MPG No results found for: PROLACTIN No results found for: CHOL, TRIG, HDL, CHOLHDL, VLDL, LDLCALC No results found for: TSH  Therapeutic Level Labs: No results found for: LITHIUM No results found for: VALPROATE No components found  for:  CBMZ  Current Medications: Current Outpatient Medications  Medication Sig Dispense Refill  . acetaminophen (TYLENOL 8 HOUR ARTHRITIS PAIN) 650 MG CR tablet Take 650 mg by mouth every 8 (eight) hours as needed for pain.    Marland Kitchen ALPRAZolam (XANAX) 0.25 MG tablet Take 1 tablet (0.25 mg total) by mouth 3 (three) times daily. 90 tablet 2  . alum & mag hydroxide-simeth (MAALOX/MYLANTA) 200-200-20 MG/5ML suspension Take 30 mLs by mouth every 6 (six) hours as needed for indigestion or heartburn.    Marland Kitchen aspirin 81 MG tablet Take 81 mg by mouth at bedtime.     Marland Kitchen atorvastatin (LIPITOR) 10 MG tablet Take 10 mg by mouth every evening.     . busPIRone (BUSPAR) 15 MG tablet Take 1 tablet (15 mg total) by mouth 2 (two) times daily. 60 tablet 2  . calcium carbonate (TUMS - DOSED IN MG ELEMENTAL CALCIUM) 500 MG chewable tablet Chew 1 tablet by mouth daily as needed for indigestion or heartburn.    Marland Kitchen ibuprofen (ADVIL,MOTRIN) 200 MG tablet Take 200 mg by mouth 2 (two) times daily as needed for moderate pain.     Marland Kitchen latanoprost (XALATAN) 0.005 % ophthalmic solution Place 1 drop into both eyes at bedtime.    Marland Kitchen levothyroxine (SYNTHROID, LEVOTHROID) 88 MCG tablet Take 88 mcg by mouth daily before breakfast.     . lisinopril (PRINIVIL,ZESTRIL) 10 MG tablet Take 10 mg by mouth every morning.     . loratadine (CLARITIN) 10 MG tablet Take 10 mg by mouth daily as needed for allergies.    . metFORMIN (GLUCOPHAGE) 500 MG tablet Take by mouth.    . metoprolol succinate (TOPROL-XL) 25 MG 24 hr tablet take 1/2 tablet BY MOUTH every morning 3 tablet 0  . metoprolol succinate (TOPROL-XL) 25 MG 24 hr tablet take 1/2 tablet BY MOUTH every morning 3 tablet 0  . polyethylene glycol (MIRALAX / GLYCOLAX) packet Take 17 g by mouth daily as needed for mild constipation.     Marland Kitchen venlafaxine XR (EFFEXOR-XR) 150 MG 24 hr capsule Take 1 capsule (150 mg total) by mouth daily with breakfast. 30 capsule 2   No current facility-administered  medications for this visit.      Musculoskeletal: Strength & Muscle Tone: {desc; muscle tone:32375} Gait & Station: {PE GAIT ED EF:[id number] Patient leans: {Patient Leans:21022755}  Psychiatric Specialty Exam: ROS  There were no vitals taken for this visit.There is no height or weight on file to calculate BMI.  General Appearance: {Appearance:22683}  Eye Contact:  {BHH EYE CONTACT:22684}  Speech:  {Speech:22685}  Volume:  {Volume (PAA):22686}  Mood:  {  [place] QG:[id number]  Affect:  {Affect (PAA):22687}  Thought Process:  {Thought Process (PAA):22688}  Orientation:  {BHH ORIENTATION (PAA):22689}  Thought Content: {Thought Content:22690}   Suicidal Thoughts:  {ST/HT (PAA):22692}  Homicidal Thoughts:  {ST/HT (PAA):22692}  Memory:  {BHH MEMORY:22881}  Judgement:  {Judgement (PAA):22694}  Insight:  {Insight (PAA):22695}  Psychomotor Activity:  {Psychomotor (PAA):22696}  Concentration:  {Concentration:21399}  Recall:  {BHH GOOD/FAIR/POOR:22877}  Fund of Knowledge: {BHH GOOD/FAIR/POOR:22877}  Language: {BHH GOOD/FAIR/POOR:22877}  Akathisia:  {BHH YES OR NO:22294}  Handed:  {Handed:22697}  AIMS (if indicated): {Desc; done/not:10129}  Assets:  {Assets (PAA):22698}  ADL's:  {BHH XO:4411959  Cognition: {chl bhh cognition:304700322}  Sleep:  {BHH GOOD/FAIR/POOR:22877}   Screenings: GAD-7     Counselor from 04/20/2018 in [place] Counselor from 09/15/2016 in [place] [facility]  Total GAD-7 Score  14  10    PHQ2-9     Counselor from 04/20/2018 in [place] [facility]-  PHQ-2 Total Score  5  PHQ-9 Total Score  14       Assessment and Plan: ***   [name], MD 08/08/2019, 10:56 AM

## 2019-08-29 DIAGNOSIS — Z23 Encounter for immunization: Secondary | ICD-10-CM | POA: Diagnosis not present

## 2019-09-09 DIAGNOSIS — Z299 Encounter for prophylactic measures, unspecified: Secondary | ICD-10-CM | POA: Diagnosis not present

## 2019-09-09 DIAGNOSIS — E1165 Type 2 diabetes mellitus with hyperglycemia: Secondary | ICD-10-CM | POA: Diagnosis not present

## 2019-09-09 DIAGNOSIS — Z6835 Body mass index (BMI) 35.0-35.9, adult: Secondary | ICD-10-CM | POA: Diagnosis not present

## 2019-09-09 DIAGNOSIS — I10 Essential (primary) hypertension: Secondary | ICD-10-CM | POA: Diagnosis not present

## 2019-09-09 DIAGNOSIS — R350 Frequency of micturition: Secondary | ICD-10-CM | POA: Diagnosis not present

## 2019-09-09 DIAGNOSIS — N390 Urinary tract infection, site not specified: Secondary | ICD-10-CM | POA: Diagnosis not present

## 2019-10-19 DIAGNOSIS — Z1339 Encounter for screening examination for other mental health and behavioral disorders: Secondary | ICD-10-CM | POA: Diagnosis not present

## 2019-10-19 DIAGNOSIS — Z79899 Other long term (current) drug therapy: Secondary | ICD-10-CM | POA: Diagnosis not present

## 2019-10-19 DIAGNOSIS — Z7189 Other specified counseling: Secondary | ICD-10-CM | POA: Diagnosis not present

## 2019-10-19 DIAGNOSIS — E039 Hypothyroidism, unspecified: Secondary | ICD-10-CM | POA: Diagnosis not present

## 2019-10-19 DIAGNOSIS — Z1331 Encounter for screening for depression: Secondary | ICD-10-CM | POA: Diagnosis not present

## 2019-10-19 DIAGNOSIS — E538 Deficiency of other specified B group vitamins: Secondary | ICD-10-CM | POA: Diagnosis not present

## 2019-10-19 DIAGNOSIS — I10 Essential (primary) hypertension: Secondary | ICD-10-CM | POA: Diagnosis not present

## 2019-10-19 DIAGNOSIS — Z299 Encounter for prophylactic measures, unspecified: Secondary | ICD-10-CM | POA: Diagnosis not present

## 2019-10-19 DIAGNOSIS — E785 Hyperlipidemia, unspecified: Secondary | ICD-10-CM | POA: Diagnosis not present

## 2019-10-19 DIAGNOSIS — Z6836 Body mass index (BMI) 36.0-36.9, adult: Secondary | ICD-10-CM | POA: Diagnosis not present

## 2019-10-19 DIAGNOSIS — Z0000 Encounter for general adult medical examination without abnormal findings: Secondary | ICD-10-CM | POA: Diagnosis not present

## 2019-10-19 DIAGNOSIS — E559 Vitamin D deficiency, unspecified: Secondary | ICD-10-CM | POA: Diagnosis not present

## 2019-10-28 DIAGNOSIS — I10 Essential (primary) hypertension: Secondary | ICD-10-CM | POA: Diagnosis not present

## 2019-10-28 DIAGNOSIS — E1165 Type 2 diabetes mellitus with hyperglycemia: Secondary | ICD-10-CM | POA: Diagnosis not present

## 2019-10-28 DIAGNOSIS — Z6837 Body mass index (BMI) 37.0-37.9, adult: Secondary | ICD-10-CM | POA: Diagnosis not present

## 2019-10-28 DIAGNOSIS — Z299 Encounter for prophylactic measures, unspecified: Secondary | ICD-10-CM | POA: Diagnosis not present

## 2019-10-28 DIAGNOSIS — E039 Hypothyroidism, unspecified: Secondary | ICD-10-CM | POA: Diagnosis not present

## 2019-11-07 ENCOUNTER — Other Ambulatory Visit: Payer: Self-pay

## 2019-11-07 ENCOUNTER — Ambulatory Visit (INDEPENDENT_AMBULATORY_CARE_PROVIDER_SITE_OTHER): Payer: Medicare Other | Admitting: Psychiatry

## 2019-11-07 ENCOUNTER — Encounter (HOSPITAL_COMMUNITY): Payer: Self-pay | Admitting: Psychiatry

## 2019-11-07 DIAGNOSIS — E1165 Type 2 diabetes mellitus with hyperglycemia: Secondary | ICD-10-CM | POA: Diagnosis not present

## 2019-11-07 DIAGNOSIS — F411 Generalized anxiety disorder: Secondary | ICD-10-CM

## 2019-11-07 DIAGNOSIS — N390 Urinary tract infection, site not specified: Secondary | ICD-10-CM | POA: Diagnosis not present

## 2019-11-07 DIAGNOSIS — Z299 Encounter for prophylactic measures, unspecified: Secondary | ICD-10-CM | POA: Diagnosis not present

## 2019-11-07 DIAGNOSIS — R350 Frequency of micturition: Secondary | ICD-10-CM | POA: Diagnosis not present

## 2019-11-07 DIAGNOSIS — I10 Essential (primary) hypertension: Secondary | ICD-10-CM | POA: Diagnosis not present

## 2019-11-07 MED ORDER — ALPRAZOLAM 0.25 MG PO TABS: 0.2500 mg | ORAL_TABLET | Freq: Three times a day (TID) | ORAL | 2 refills | Status: DC

## 2019-11-07 MED ORDER — BUSPIRONE HCL 15 MG PO TABS: 15.0000 mg | ORAL_TABLET | Freq: Two times a day (BID) | ORAL | 2 refills | Status: DC

## 2019-11-07 MED ORDER — VENLAFAXINE HCL ER 150 MG PO CP24: 150.0000 mg | ORAL_CAPSULE | Freq: Every day | ORAL | 2 refills | Status: DC

## 2019-11-07 NOTE — Progress Notes (Signed)
Virtual Visit via Telephone Note  I connected with Yesenia Reynolds on 11/07/19 at 11:20 AM EST by telephone and verified that I am speaking with the correct person using two identifiers.   I discussed the limitations, risks, security and privacy concerns of performing an evaluation and management service by telephone and the availability of in person appointments. I also discussed with the patient that there may be a patient responsible charge related to this service. The patient expressed understanding and agreed to proceed.     I discussed the assessment and treatment plan with the patient. The patient was provided an opportunity to ask questions and all were answered. The patient agreed with the plan and demonstrated an understanding of the instructions.   The patient was advised to call back or seek an in-person evaluation if the symptoms worsen or if the condition fails to improve as anticipated.  I provided 15 minutes of non-face-to-face time during this encounter.   [name], MD  [facility] MD/PA/NP OP Progress Note  11/07/2019 11:34 AM Yesenia Reynolds  MRN:  [id number]  Chief Complaint:  Chief Complaint    Depression; Anxiety; Follow-up     HPI:  This patient is a 78 year old single white female who livesIn a rest home. 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  The patient was referred by her oncologist for further treatment and assessment of severe anxiety.  The patient states that she has had no prior psychiatric assessment or treatment. She began to have endometrial bleeding in April 2016. She was found to have endometrial cancer and underwent hysterectomy. Following that she had 2 bouts of chemotherapy followed by radiation and eventually intravaginal radiation. She states that she finished all these treatments in October of 2016. She states that she did very well throughout the treatments and stayed calm and did not have  significant problems with nausea and vomiting.  In November 2016 she began to have significant problems with anxiety. She didn't want anyone coming around and visiting her she felt extremely anxious being around people are going out to restaurants or to church. She is been a very gregarious person all her life and her personality totally changed. She denied being depressed sad or crying. She was shaking all the time and couldn't stay alone. She still can't stay alone. She stopped driving her car. She gave up things she enjoyed like going to her nephew's baseball games. She also could not watch anything frightening like police shows on TV because she would become extremely anxious. She is eating fairly well but lost a good deal of weight during her treatments. She is sleeping well and denies nightmares. She states that she had short-term memory loss shortly after treatment but this is gotten much better.  She was tried on Paxil but it was not helpful. Her doctor has her on Xanax 0.25 mg which does help but she takes it as needed. Her gynecologist put her on Lamictal and she is up to 150 mg a day. She thinks this is helped more than anything else because she is less anxious than she was and is slowly starting to drive a little bit and be around a few people at a time. Her hand still shake at times. She would like to get out and do all the things she used to do such as drive on her own go to baseball games enjoy her friends and church but she is  beginning to realize that this is going to be a slow process. She denies suicidal ideation or auditory or visual hallucinations  The patient returns for follow-up after 3 months.  She continues to live in the rest home and overall she likes it there.  She went out for Thanksgiving to see her sister and her old roommate and the visits went well.  She is having very minimal shaking.  She denies any serious symptoms of anxiety or depression.  Her mood has been good and she  sounds quite upbeat.  She is sleeping well.  She thinks her medications continue to help with depression and anxiety Visit Diagnosis:    ICD-10-CM   1. Generalized anxiety disorder  F41.1 ALPRAZolam (XANAX) 0.25 MG tablet    Past Psychiatric History: Hospitalization a year and a half ago for depression and anxiety with ECT treatment  Past Medical History:  Past Medical History:  Diagnosis Date  . Agoraphobia with panic attacks 08/07/2016  . Anxiety   . Arthritis   . Atrial fibrillation, currently in sinus rhythm   . Cancer [facility])    Endometrial  . Depression   . Endometrial cancer, FIGO stage IIIC ([place]) 08/08/2015  . Essential hypertension, benign   . GERD (gastroesophageal reflux disease)   . Hypothyroidism   . Mixed hyperlipidemia   . Palpitations   . Spinal stenosis   . Spinal stenosis of lumbar region at multiple levels 08/07/2016  . Type 2 diabetes mellitus ([place])     Past Surgical History:  Procedure Laterality Date  . ABDOMINAL HYSTERECTOMY    . BREAST BIOPSY     benign  . CATARACT EXTRACTION W/PHACO Left 03/31/2017   Procedure: CATARACT EXTRACTION PHACO AND INTRAOCULAR LENS PLACEMENT (IOC);  Surgeon: Rutherford Guys, MD;  Location: [facility];  Service: Ophthalmology;  Laterality: Left;  CDE: 8.67  . CATARACT EXTRACTION W/PHACO Right 04/14/2017   Procedure: CATARACT EXTRACTION PHACO AND INTRAOCULAR LENS PLACEMENT RIGHT EYE;  Surgeon: Rutherford Guys, MD;  Location: [facility];  Service: Ophthalmology;  Laterality: Right;  CDE: 8.52  . LIPOMA EXCISION    . Multiple dental extractions    . PORT-A-CATH REMOVAL Right 09/02/2017   Procedure: MINOR REMOVAL PORT-A-CATH;  Surgeon: Aviva Signs, MD;  Location: [facility];  Service: General;  Laterality: Right;  . TONSILLECTOMY AND ADENOIDECTOMY    . TOTAL KNEE ARTHROPLASTY  08/04/2012   Procedure: TOTAL KNEE ARTHROPLASTY;  Surgeon: Gearlean Alf, MD;  [facility]: [facility];  Service: Orthopedics;  Laterality: Left;    Family Psychiatric History: see  below  Family History:  Family History  Problem Relation Age of Onset  . Stroke Mother   . Heart failure Mother   . Hypertension Father   . Coronary artery disease Father     Social History:  Social History   Socioeconomic History  . Marital status: Single    Spouse name: Not on file  . Number of children: Not on file  . Years of education: Not on file  . Highest education level: Not on file  Occupational History  . Not on file  Social Needs  . Financial resource strain: Not on file  . Food insecurity    Worry: Not on file    Inability: Not on file  . Transportation needs    Medical: Not on file    Non-medical: Not on file  Tobacco Use  . Smoking status: Never Smoker  . Smokeless tobacco: Never Used  Substance and Sexual Activity  . Alcohol  use: No    Comment: 07/10/2016 per pt no  . Drug use: No    Comment: 8/3/2017per pt no   . Sexual activity: Never  Lifestyle  . Physical activity    Days per week: Not on file    Minutes per session: Not on file  . Stress: Not on file  Relationships  . Social Herbalist on phone: Not on file    Gets together: Not on file    Attends religious service: Not on file    Active member of club or organization: Not on file    Attends meetings of clubs or organizations: Not on file    Relationship status: Not on file  Other Topics Concern  . Not on file  Social History Narrative  . Not on file    Allergies:  Allergies  Allergen Reactions  . Morphine And Related Other (See Comments)    Hypotension  . Other     Preservative in latanoprost causes redness, pt uses preservative free latanoprost   . Paxil [Paroxetine Hcl]     unknown  . Latex Rash    Metabolic Disorder Labs: No results found for: HGBA1C, MPG No results found for: PROLACTIN No results found for: CHOL, TRIG, HDL, CHOLHDL, VLDL, LDLCALC No results found for: TSH  Therapeutic Level Labs: No results found for: LITHIUM No results found for:  VALPROATE No components found for:  CBMZ  Current Medications: Current Outpatient Medications  Medication Sig Dispense Refill  . acetaminophen (TYLENOL 8 HOUR ARTHRITIS PAIN) 650 MG CR tablet Take 650 mg by mouth every 8 (eight) hours as needed for pain.    Marland Kitchen ALPRAZolam (XANAX) 0.25 MG tablet Take 1 tablet (0.25 mg total) by mouth 3 (three) times daily. 90 tablet 2  . alum & mag hydroxide-simeth (MAALOX/MYLANTA) 200-200-20 MG/5ML suspension Take 30 mLs by mouth every 6 (six) hours as needed for indigestion or heartburn.    Marland Kitchen aspirin 81 MG tablet Take 81 mg by mouth at bedtime.     Marland Kitchen atorvastatin (LIPITOR) 10 MG tablet Take 10 mg by mouth every evening.     . busPIRone (BUSPAR) 15 MG tablet Take 1 tablet (15 mg total) by mouth 2 (two) times daily. 60 tablet 2  . calcium carbonate (TUMS - DOSED IN MG ELEMENTAL CALCIUM) 500 MG chewable tablet Chew 1 tablet by mouth daily as needed for indigestion or heartburn.    Marland Kitchen ibuprofen (ADVIL,MOTRIN) 200 MG tablet Take 200 mg by mouth 2 (two) times daily as needed for moderate pain.     Marland Kitchen latanoprost (XALATAN) 0.005 % ophthalmic solution Place 1 drop into both eyes at bedtime.    Marland Kitchen levothyroxine (SYNTHROID, LEVOTHROID) 88 MCG tablet Take 88 mcg by mouth daily before breakfast.     . lisinopril (PRINIVIL,ZESTRIL) 10 MG tablet Take 10 mg by mouth every morning.     . loratadine (CLARITIN) 10 MG tablet Take 10 mg by mouth daily as needed for allergies.    . metFORMIN (GLUCOPHAGE) 500 MG tablet Take by mouth.    . metoprolol succinate (TOPROL-XL) 25 MG 24 hr tablet take 1/2 tablet BY MOUTH every morning 3 tablet 0  . metoprolol succinate (TOPROL-XL) 25 MG 24 hr tablet take 1/2 tablet BY MOUTH every morning 3 tablet 0  . polyethylene glycol (MIRALAX / GLYCOLAX) packet Take 17 g by mouth daily as needed for mild constipation.     Marland Kitchen venlafaxine XR (EFFEXOR-XR) 150 MG 24 hr capsule Take 1  capsule (150 mg total) by mouth daily with breakfast. 30 capsule 2   No  current facility-administered medications for this visit.      Musculoskeletal: Strength & Muscle Tone: within normal limits Gait & Station: normal Patient leans: N/A  Psychiatric Specialty Exam: Review of Systems  Musculoskeletal: Positive for back pain and joint pain.  All other systems reviewed and are negative.   There were no vitals taken for this visit.There is no height or weight on file to calculate BMI.  General Appearance: NA  Eye Contact:  NA  Speech:  Clear and Coherent  Volume:  Normal  Mood:  Euthymic  Affect:  NA  Thought Process:  Goal Directed  Orientation:  Full (Time, Place, and Person)  Thought Content: WDL   Suicidal Thoughts:  No  Homicidal Thoughts:  No  Memory:  Immediate;   Good Recent;   Good Remote;   Fair  Judgement:  Good  Insight:  Good  Psychomotor Activity:  Normal  Concentration:  Concentration: Good and Attention Span: Good  Recall:  Good  Fund of Knowledge: Good  Language: Good  Akathisia:  No  Handed:  Right  AIMS (if indicated): not done  Assets:  Communication Skills Desire for Improvement Resilience Social Support Talents/Skills  ADL's:  Intact  Cognition: WNL  Sleep:  Good   Screenings: GAD-7     Counselor from 04/20/2018 in [place] from 09/15/2016 in [place] [facility]  Total GAD-7 Score  14  10    PHQ2-9     Counselor from 04/20/2018 in [place] [facility]  PHQ-2 Total Score  5  PHQ-9 Total Score  14       Assessment and Plan: This patient is a 78 year old female with a history of severe depression and anxiety.  She has done very well since she had her ECT treatments about a year and a half ago.  She will continue Xanax 0.25 mg twice daily for anxiety, BuSpar 15 mg twice daily for anxiety and Effexor XR 150 mg daily for depression.  She will return to see me in 3 months   [name],  MD 11/07/2019, 11:35 AM

## 2019-12-15 DIAGNOSIS — H401131 Primary open-angle glaucoma, bilateral, mild stage: Secondary | ICD-10-CM | POA: Diagnosis not present

## 2020-01-10 DIAGNOSIS — R319 Hematuria, unspecified: Secondary | ICD-10-CM | POA: Diagnosis not present

## 2020-01-10 DIAGNOSIS — Z6835 Body mass index (BMI) 35.0-35.9, adult: Secondary | ICD-10-CM | POA: Diagnosis not present

## 2020-01-10 DIAGNOSIS — I10 Essential (primary) hypertension: Secondary | ICD-10-CM | POA: Diagnosis not present

## 2020-01-10 DIAGNOSIS — Z23 Encounter for immunization: Secondary | ICD-10-CM | POA: Diagnosis not present

## 2020-01-10 DIAGNOSIS — E1165 Type 2 diabetes mellitus with hyperglycemia: Secondary | ICD-10-CM | POA: Diagnosis not present

## 2020-01-10 DIAGNOSIS — N390 Urinary tract infection, site not specified: Secondary | ICD-10-CM | POA: Diagnosis not present

## 2020-01-10 DIAGNOSIS — Z299 Encounter for prophylactic measures, unspecified: Secondary | ICD-10-CM | POA: Diagnosis not present

## 2020-01-10 DIAGNOSIS — N3281 Overactive bladder: Secondary | ICD-10-CM | POA: Diagnosis not present

## 2020-01-17 DIAGNOSIS — E1159 Type 2 diabetes mellitus with other circulatory complications: Secondary | ICD-10-CM | POA: Diagnosis not present

## 2020-01-17 DIAGNOSIS — E1140 Type 2 diabetes mellitus with diabetic neuropathy, unspecified: Secondary | ICD-10-CM | POA: Diagnosis not present

## 2020-02-02 DIAGNOSIS — E1165 Type 2 diabetes mellitus with hyperglycemia: Secondary | ICD-10-CM | POA: Diagnosis not present

## 2020-02-02 DIAGNOSIS — Z299 Encounter for prophylactic measures, unspecified: Secondary | ICD-10-CM | POA: Diagnosis not present

## 2020-02-02 DIAGNOSIS — I10 Essential (primary) hypertension: Secondary | ICD-10-CM | POA: Diagnosis not present

## 2020-02-02 DIAGNOSIS — E039 Hypothyroidism, unspecified: Secondary | ICD-10-CM | POA: Diagnosis not present

## 2020-02-02 DIAGNOSIS — Z6835 Body mass index (BMI) 35.0-35.9, adult: Secondary | ICD-10-CM | POA: Diagnosis not present

## 2020-02-02 DIAGNOSIS — C55 Malignant neoplasm of uterus, part unspecified: Secondary | ICD-10-CM | POA: Diagnosis not present

## 2020-02-06 ENCOUNTER — Other Ambulatory Visit: Payer: Self-pay

## 2020-02-06 ENCOUNTER — Encounter (HOSPITAL_COMMUNITY): Payer: Self-pay | Admitting: Psychiatry

## 2020-02-06 ENCOUNTER — Ambulatory Visit (INDEPENDENT_AMBULATORY_CARE_PROVIDER_SITE_OTHER): Payer: Medicare Other | Admitting: Psychiatry

## 2020-02-06 DIAGNOSIS — F411 Generalized anxiety disorder: Secondary | ICD-10-CM | POA: Diagnosis not present

## 2020-02-06 MED ORDER — VENLAFAXINE HCL ER 150 MG PO CP24: 150.0000 mg | ORAL_CAPSULE | Freq: Every day | ORAL | 2 refills | Status: DC

## 2020-02-06 MED ORDER — ALPRAZOLAM 0.25 MG PO TABS: 0.2500 mg | ORAL_TABLET | Freq: Three times a day (TID) | ORAL | 2 refills | Status: DC

## 2020-02-06 MED ORDER — BUSPIRONE HCL 15 MG PO TABS: 15.0000 mg | ORAL_TABLET | Freq: Two times a day (BID) | ORAL | 2 refills | Status: DC

## 2020-02-06 NOTE — Progress Notes (Signed)
Virtual Visit via Telephone Note  I connected with Yesenia Reynolds on 02/06/20 at 11:00 AM EST by telephone and verified that I am speaking with the correct person using two identifiers.   I discussed the limitations, risks, security and privacy concerns of performing an evaluation and management service by telephone and the availability of in person appointments. I also discussed with the patient that there may be a patient responsible charge related to this service. The patient expressed understanding and agreed to proceed.     I discussed the assessment and treatment plan with the patient. The patient was provided an opportunity to ask questions and all were answered. The patient agreed with the plan and demonstrated an understanding of the instructions.   The patient was advised to call back or seek an in-person evaluation if the symptoms worsen or if the condition fails to improve as anticipated.  I provided 15 minutes of non-face-to-face time during this encounter.   [name], MD  [facility] MD/PA/NP OP Progress Note  02/06/2020 11:09 AM Yesenia Reynolds  MRN:  [id number]  Chief Complaint:  Chief Complaint    Depression; Anxiety; Follow-up     HPI: This patient is a 79 year old single white female who livesIn a rest home. 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  The patient was referred by her oncologist for further treatment and assessment of severe anxiety.  The patient states that she has had no prior psychiatric assessment or treatment. She began to have endometrial bleeding in April 2016. She was found to have endometrial cancer and underwent hysterectomy. Following that she had 2 bouts of chemotherapy followed by radiation and eventually intravaginal radiation. She states that she finished all these treatments in October of 2016. She states that she did very well throughout the treatments and stayed calm and did not have  significant problems with nausea and vomiting.  In November 2016 she began to have significant problems with anxiety. She didn't want anyone coming around and visiting her she felt extremely anxious being around people are going out to restaurants or to church. She is been a very gregarious person all her life and her personality totally changed. She denied being depressed sad or crying. She was shaking all the time and couldn't stay alone. She still can't stay alone. She stopped driving her car. She gave up things she enjoyed like going to her nephew's baseball games. She also could not watch anything frightening like police shows on TV because she would become extremely anxious. She is eating fairly well but lost a good deal of weight during her treatments. She is sleeping well and denies nightmares. She states that she had short-term memory loss shortly after treatment but this is gotten much better.  She was tried on Paxil but it was not helpful. Her doctor has her on Xanax 0.25 mg which does help but she takes it as needed. Her gynecologist put her on Lamictal and she is up to 150 mg a day. She thinks this is helped more than anything else because she is less anxious than she was and is slowly starting to drive a little bit and be around a few people at a time. Her hand still shake at times. She would like to get out and do all the things she used to do such as drive on her own go to baseball games enjoy her friends and church but she is beginning  to realize that this is going to be a slow process. She denies suicidal ideation or auditory or visual hallucinations  The patient returns after 3 months.  Overall she is doing well despite trying circumstances.  Her rest home was totally shut down to the coronavirus cases and she stated about 5 people died.  For a while they were not able to leave the rooms but now they are able to eat in the dining hall.  She states that she did fine by connecting with  people on the phone  and iPad.  Despite not getting out much she has been in great spirits and states that her mood is good and she denies significant anxiety or panic attacks.  She denies any thoughts of self-harm or suicidal ideation shakiness or crying spells.  She thinks she is doing quite well.  She denies any new changes in her health or medications.  She never did catch coronavirus Visit Diagnosis:    ICD-10-CM   1. Generalized anxiety disorder  F41.1 ALPRAZolam (XANAX) 0.25 MG tablet    Past Psychiatric History: Hospitalization about 3 years ago for depression and anxiety with ECT treatment  Past Medical History:  Past Medical History:  Diagnosis Date  . Agoraphobia with panic attacks 08/07/2016  . Anxiety   . Arthritis   . Atrial fibrillation, currently in sinus rhythm   . Cancer [facility])    Endometrial  . Depression   . Endometrial cancer, FIGO stage IIIC ([place]) 08/08/2015  . Essential hypertension, benign   . GERD (gastroesophageal reflux disease)   . Hypothyroidism   . Mixed hyperlipidemia   . Palpitations   . Spinal stenosis   . Spinal stenosis of lumbar region at multiple levels 08/07/2016  . Type 2 diabetes mellitus ([place])     Past Surgical History:  Procedure Laterality Date  . ABDOMINAL HYSTERECTOMY    . BREAST BIOPSY     benign  . CATARACT EXTRACTION W/PHACO Left 03/31/2017   Procedure: CATARACT EXTRACTION PHACO AND INTRAOCULAR LENS PLACEMENT (IOC);  Surgeon: Rutherford Guys, MD;  [facility]: [facility];  Service: Ophthalmology;  Laterality: Left;  CDE: 8.67  . CATARACT EXTRACTION W/PHACO Right 04/14/2017   Procedure: CATARACT EXTRACTION PHACO AND INTRAOCULAR LENS PLACEMENT RIGHT EYE;  Surgeon: Rutherford Guys, MD;  Location: [facility];  Service: Ophthalmology;  Laterality: Right;  CDE: 8.52  . LIPOMA EXCISION    . Multiple dental extractions    . PORT-A-CATH REMOVAL Right 09/02/2017   Procedure: MINOR REMOVAL PORT-A-CATH;  Surgeon: Aviva Signs, MD;  Location: [facility];  Service:  General;  Laterality: Right;  . TONSILLECTOMY AND ADENOIDECTOMY    . TOTAL KNEE ARTHROPLASTY  08/04/2012   Procedure: TOTAL KNEE ARTHROPLASTY;  Surgeon: Gearlean Alf, MD;  Location: [facility];  Service: Orthopedics;  Laterality: Left;    Family Psychiatric History: see below  Family History:  Family History  Problem Relation Age of Onset  . Stroke Mother   . Heart failure Mother   . Hypertension Father   . Coronary artery disease Father     Social History:  Social History   Socioeconomic History  . Marital status: Single    Spouse name: Not on file  . Number of children: Not on file  . Years of education: Not on file  . Highest education level: Not on file  Occupational History  . Not on file  Tobacco Use  . Smoking status: Never Smoker  . Smokeless tobacco: Never Used  Substance and Sexual Activity  .  Alcohol use: No    Comment: 07/10/2016 per pt no  . Drug use: No    Comment: 8/3/2017per pt no   . Sexual activity: Never  Other Topics Concern  . Not on file  Social History Narrative  . Not on file   Social Determinants of Health   Financial Resource Strain:   . Difficulty of Paying Living Expenses: Not on file  Food Insecurity:   . Worried About Charity fundraiser in the Last Year: Not on file  . Ran Out of Food in the Last Year: Not on file  Transportation Needs:   . Lack of Transportation (Medical): Not on file  . Lack of Transportation (Non-Medical): Not on file  Physical Activity:   . Days of Exercise per Week: Not on file  . Minutes of Exercise per Session: Not on file  Stress:   . Feeling of Stress : Not on file  Social Connections:   . Frequency of Communication with Friends and Family: Not on file  . Frequency of Social Gatherings with Friends and Family: Not on file  . Attends Religious Services: Not on file  . Active Member of Clubs or Organizations: Not on file  . Attends Archivist Meetings: Not on file  . Marital Status: Not on file     Allergies:  Allergies  Allergen Reactions  . Morphine And Related Other (See Comments)    Hypotension  . Other     Preservative in latanoprost causes redness, pt uses preservative free latanoprost   . Paxil [Paroxetine Hcl]     unknown  . Latex Rash    Metabolic Disorder Labs: No results found for: HGBA1C, MPG No results found for: PROLACTIN No results found for: CHOL, TRIG, HDL, CHOLHDL, VLDL, LDLCALC No results found for: TSH  Therapeutic Level Labs: No results found for: LITHIUM No results found for: VALPROATE No components found for:  CBMZ  Current Medications: Current Outpatient Medications  Medication Sig Dispense Refill  . acetaminophen (TYLENOL 8 HOUR ARTHRITIS PAIN) 650 MG CR tablet Take 650 mg by mouth every 8 (eight) hours as needed for pain.    Marland Kitchen ALPRAZolam (XANAX) 0.25 MG tablet Take 1 tablet (0.25 mg total) by mouth 3 (three) times daily. 90 tablet 2  . alum & mag hydroxide-simeth (MAALOX/MYLANTA) 200-200-20 MG/5ML suspension Take 30 mLs by mouth every 6 (six) hours as needed for indigestion or heartburn.    Marland Kitchen aspirin 81 MG tablet Take 81 mg by mouth at bedtime.     Marland Kitchen atorvastatin (LIPITOR) 10 MG tablet Take 10 mg by mouth every evening.     . busPIRone (BUSPAR) 15 MG tablet Take 1 tablet (15 mg total) by mouth 2 (two) times daily. 60 tablet 2  . calcium carbonate (TUMS - DOSED IN MG ELEMENTAL CALCIUM) 500 MG chewable tablet Chew 1 tablet by mouth daily as needed for indigestion or heartburn.    Marland Kitchen ibuprofen (ADVIL,MOTRIN) 200 MG tablet Take 200 mg by mouth 2 (two) times daily as needed for moderate pain.     Marland Kitchen latanoprost (XALATAN) 0.005 % ophthalmic solution Place 1 drop into both eyes at bedtime.    Marland Kitchen levothyroxine (SYNTHROID, LEVOTHROID) 88 MCG tablet Take 88 mcg by mouth daily before breakfast.     . lisinopril (PRINIVIL,ZESTRIL) 10 MG tablet Take 10 mg by mouth every morning.     . loratadine (CLARITIN) 10 MG tablet Take 10 mg by mouth daily as needed for  allergies.    Marland Kitchen  metFORMIN (GLUCOPHAGE) 500 MG tablet Take by mouth.    . metoprolol succinate (TOPROL-XL) 25 MG 24 hr tablet take 1/2 tablet BY MOUTH every morning 3 tablet 0  . metoprolol succinate (TOPROL-XL) 25 MG 24 hr tablet take 1/2 tablet BY MOUTH every morning 3 tablet 0  . polyethylene glycol (MIRALAX / GLYCOLAX) packet Take 17 g by mouth daily as needed for mild constipation.     Marland Kitchen venlafaxine XR (EFFEXOR-XR) 150 MG 24 hr capsule Take 1 capsule (150 mg total) by mouth daily with breakfast. 30 capsule 2   No current facility-administered medications for this visit.     Musculoskeletal: Strength & Muscle Tone: decreased Gait & Station: unsteady Patient leans: N/A  Psychiatric Specialty Exam: Review of Systems  Musculoskeletal: Positive for arthralgias and back pain.  All other systems reviewed and are negative.   There were no vitals taken for this visit.There is no height or weight on file to calculate BMI.  General Appearance: NA  Eye Contact:  NA  Speech:  Clear and Coherent  Volume:  Normal  Mood:  Euthymic  Affect:  NA  Thought Process:  Goal Directed  Orientation:  Full (Time, Place, and Person)  Thought Content: WDL   Suicidal Thoughts:  No  Homicidal Thoughts:  No  Memory:  Immediate;   Good Recent;   Good Remote;   Good  Judgement:  Good  Insight:  Good  Psychomotor Activity:  Decreased  Concentration:  Concentration: Good and Attention Span: Good  Recall:  Good  Fund of Knowledge: Good  Language: Good  Akathisia:  No  Handed:  Right  AIMS (if indicated): not done  Assets:  Communication Skills Desire for Improvement Resilience Social Support Talents/Skills  ADL's:  Intact  Cognition: WNL  Sleep:  Good   Screenings: GAD-7     Counselor from 04/20/2018 in [place] from 09/15/2016 in [place] [facility]  Total GAD-7 Score  14  10    PHQ2-9     Counselor  from 04/20/2018 in [place] [facility]  PHQ-2 Total Score  5  PHQ-9 Total Score  14       Assessment and Plan: This patient is a 79 year old female with a history of severe depression and anxiety.  She has done very well since her ECT treatments about 3 years ago.  She will continue Xanax 0.2 mg twice daily for anxiety, BuSpar 15 mg twice daily for anxiety and Effexor XR 150 mg daily for depression.  She will return to see me in 3 months   [name], MD 02/06/2020, 11:09 AM

## 2020-02-07 DIAGNOSIS — Z23 Encounter for immunization: Secondary | ICD-10-CM | POA: Diagnosis not present

## 2020-02-09 DIAGNOSIS — Z299 Encounter for prophylactic measures, unspecified: Secondary | ICD-10-CM | POA: Diagnosis not present

## 2020-02-09 DIAGNOSIS — Z6835 Body mass index (BMI) 35.0-35.9, adult: Secondary | ICD-10-CM | POA: Diagnosis not present

## 2020-02-09 DIAGNOSIS — C55 Malignant neoplasm of uterus, part unspecified: Secondary | ICD-10-CM | POA: Diagnosis not present

## 2020-02-09 DIAGNOSIS — I10 Essential (primary) hypertension: Secondary | ICD-10-CM | POA: Diagnosis not present

## 2020-02-09 DIAGNOSIS — E1165 Type 2 diabetes mellitus with hyperglycemia: Secondary | ICD-10-CM | POA: Diagnosis not present

## 2020-03-19 DIAGNOSIS — H401131 Primary open-angle glaucoma, bilateral, mild stage: Secondary | ICD-10-CM | POA: Diagnosis not present

## 2020-03-19 DIAGNOSIS — Z961 Presence of intraocular lens: Secondary | ICD-10-CM | POA: Diagnosis not present

## 2020-03-19 DIAGNOSIS — E119 Type 2 diabetes mellitus without complications: Secondary | ICD-10-CM | POA: Diagnosis not present

## 2020-03-29 DIAGNOSIS — K5900 Constipation, unspecified: Secondary | ICD-10-CM | POA: Diagnosis not present

## 2020-03-29 DIAGNOSIS — K649 Unspecified hemorrhoids: Secondary | ICD-10-CM | POA: Diagnosis not present

## 2020-03-29 DIAGNOSIS — Z299 Encounter for prophylactic measures, unspecified: Secondary | ICD-10-CM | POA: Diagnosis not present

## 2020-03-29 DIAGNOSIS — E1165 Type 2 diabetes mellitus with hyperglycemia: Secondary | ICD-10-CM | POA: Diagnosis not present

## 2020-03-29 DIAGNOSIS — I10 Essential (primary) hypertension: Secondary | ICD-10-CM | POA: Diagnosis not present

## 2020-03-29 DIAGNOSIS — C55 Malignant neoplasm of uterus, part unspecified: Secondary | ICD-10-CM | POA: Diagnosis not present

## 2020-04-09 ENCOUNTER — Other Ambulatory Visit (HOSPITAL_COMMUNITY): Payer: Self-pay | Admitting: Psychiatry

## 2020-04-11 DIAGNOSIS — E1165 Type 2 diabetes mellitus with hyperglycemia: Secondary | ICD-10-CM | POA: Diagnosis not present

## 2020-04-11 DIAGNOSIS — I10 Essential (primary) hypertension: Secondary | ICD-10-CM | POA: Diagnosis not present

## 2020-04-11 DIAGNOSIS — K649 Unspecified hemorrhoids: Secondary | ICD-10-CM | POA: Diagnosis not present

## 2020-04-11 DIAGNOSIS — Z299 Encounter for prophylactic measures, unspecified: Secondary | ICD-10-CM | POA: Diagnosis not present

## 2020-04-11 DIAGNOSIS — C55 Malignant neoplasm of uterus, part unspecified: Secondary | ICD-10-CM | POA: Diagnosis not present

## 2020-05-06 ENCOUNTER — Other Ambulatory Visit (HOSPITAL_COMMUNITY): Payer: Self-pay | Admitting: Psychiatry

## 2020-05-06 DIAGNOSIS — F411 Generalized anxiety disorder: Secondary | ICD-10-CM

## 2020-05-08 ENCOUNTER — Other Ambulatory Visit: Payer: Self-pay

## 2020-05-08 ENCOUNTER — Telehealth (INDEPENDENT_AMBULATORY_CARE_PROVIDER_SITE_OTHER): Payer: Medicare Other | Admitting: Psychiatry

## 2020-05-08 ENCOUNTER — Encounter (HOSPITAL_COMMUNITY): Payer: Self-pay | Admitting: Psychiatry

## 2020-05-08 DIAGNOSIS — F411 Generalized anxiety disorder: Secondary | ICD-10-CM

## 2020-05-08 MED ORDER — ALPRAZOLAM 0.25 MG PO TABS: 0.2500 mg | ORAL_TABLET | Freq: Three times a day (TID) | ORAL | 2 refills | Status: DC

## 2020-05-08 MED ORDER — VENLAFAXINE HCL ER 150 MG PO CP24: 150.0000 mg | ORAL_CAPSULE | Freq: Every day | ORAL | 2 refills | Status: DC

## 2020-05-08 MED ORDER — BUSPIRONE HCL 15 MG PO TABS: 15.0000 mg | ORAL_TABLET | Freq: Two times a day (BID) | ORAL | 2 refills | Status: DC

## 2020-05-08 NOTE — Progress Notes (Signed)
Virtual Visit via Telephone Note  I connected with Yesenia Reynolds on 05/08/20 at 10:00 AM EDT by telephone and verified that I am speaking with the correct person using two identifiers.   I discussed the limitations, risks, security and privacy concerns of performing an evaluation and management service by telephone and the availability of in person appointments. I also discussed with the patient that there may be a patient responsible charge related to this service. The patient expressed understanding and agreed to proceed.      I discussed the assessment and treatment plan with the patient. The patient was provided an opportunity to ask questions and all were answered. The patient agreed with the plan and demonstrated an understanding of the instructions.   The patient was advised to call back or seek an in-person evaluation if the symptoms worsen or if the condition fails to improve as anticipated.  I provided 15 minutes of non-face-to-face time during this encounter. Location: Provider office, patient home  [name], MD  [facility] MD/PA/NP OP Progress Note  05/08/2020 10:14 AM Yesenia Reynolds  MRN:  [id number]  Chief Complaint:  Chief Complaint    Depression; Anxiety; Follow-up     HPI: This patient is a 79 year old single white female who livesIn a rest home. 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  The patient was referred by her oncologist for further treatment and assessment of severe anxiety.  The patient states that she has had no prior psychiatric assessment or treatment. She began to have endometrial bleeding in April 2016. She was found to have endometrial cancer and underwent hysterectomy. Following that she had 2 bouts of chemotherapy followed by radiation and eventually intravaginal radiation. She states that she finished all these treatments in October of 2016. She states that she did very well throughout the treatments  and stayed calm and did not have significant problems with nausea and vomiting.  In November 2016 she began to have significant problems with anxiety. She didn't want anyone coming around and visiting her she felt extremely anxious being around people are going out to restaurants or to church. She is been a very gregarious person all her life and her personality totally changed. She denied being depressed sad or crying. She was shaking all the time and couldn't stay alone. She still can't stay alone. She stopped driving her car. She gave up things she enjoyed like going to her nephew's baseball games. She also could not watch anything frightening like police shows on TV because she would become extremely anxious. She is eating fairly well but lost a good deal of weight during her treatments. She is sleeping well and denies nightmares. She states that she had short-term memory loss shortly after treatment but this is gotten much better.  She was tried on Paxil but it was not helpful. Her doctor has her on Xanax 0.25 mg which does help but she takes it as needed. Her gynecologist put her on Lamictal and she is up to 150 mg a day. She thinks this is helped more than anything else because she is less anxious than she was and is slowly starting to drive a little bit and be around a few people at a time. Her hand still shake at times. She would like to get out and do all the things she used to do such as drive on her own go to baseball games enjoy her friends and  church but she is beginning to realize that this is going to be a slow process. She denies suicidal ideation or auditory or visual hallucinations  The patient returns for follow-up after 3 months.  She states that she is doing very well.  She is gone her coronavirus vaccine and is getting back into normal activities.  She is even driving on her own some.  She is going to church and doing activities with other residents at her rest home.  She sometimes has  trouble getting to sleep but stays asleep all night and does not feel tired the next day.  She denies any significant shaking or anxiety or panic attacks.  She denies significant depression or suicidal ideation.  She sounds very upbeat.  She is still walking with a walker  Visit Diagnosis:    ICD-10-CM   1. Generalized anxiety disorder  F41.1 ALPRAZolam (XANAX) 0.25 MG tablet    Past Psychiatric History: Hospitalization about 3 years ago for depression and anxiety which responded to ECT treatment  Past Medical History:  Past Medical History:  Diagnosis Date  . Agoraphobia with panic attacks 08/07/2016  . Anxiety   . Arthritis   . Atrial fibrillation, currently in sinus rhythm   . Cancer [facility])    Endometrial  . Depression   . Endometrial cancer, FIGO stage IIIC ([place]) 08/08/2015  . Essential hypertension, benign   . GERD (gastroesophageal reflux disease)   . Hypothyroidism   . Mixed hyperlipidemia   . Palpitations   . Spinal stenosis   . Spinal stenosis of lumbar region at multiple levels 08/07/2016  . Type 2 diabetes mellitus ([place])     Past Surgical History:  Procedure Laterality Date  . ABDOMINAL HYSTERECTOMY    . BREAST BIOPSY     benign  . CATARACT EXTRACTION W/PHACO Left 03/31/2017   Procedure: CATARACT EXTRACTION PHACO AND INTRAOCULAR LENS PLACEMENT (IOC);  Surgeon: Rutherford Guys, MD;  Location: [facility];  Service: Ophthalmology;  Laterality: Left;  CDE: 8.67  . CATARACT EXTRACTION W/PHACO Right 04/14/2017   Procedure: CATARACT EXTRACTION PHACO AND INTRAOCULAR LENS PLACEMENT RIGHT EYE;  Surgeon: Rutherford Guys, MD;  Location: [facility];  Service: Ophthalmology;  Laterality: Right;  CDE: 8.52  . LIPOMA EXCISION    . Multiple dental extractions    . PORT-A-CATH REMOVAL Right 09/02/2017   Procedure: MINOR REMOVAL PORT-A-CATH;  Surgeon: Aviva Signs, MD;  Location: [facility];  Service: General;  Laterality: Right;  . TONSILLECTOMY AND ADENOIDECTOMY    . TOTAL KNEE ARTHROPLASTY  08/04/2012    Procedure: TOTAL KNEE ARTHROPLASTY;  Surgeon: Gearlean Alf, MD;  Location: [facility];  Service: Orthopedics;  Laterality: Left;    Family Psychiatric History: see below  Family History:  Family History  Problem Relation Age of Onset  . Stroke Mother   . Heart failure Mother   . Hypertension Father   . Coronary artery disease Father     Social History:  Social History   Socioeconomic History  . Marital status: Single    Spouse name: Not on file  . Number of children: Not on file  . Years of education: Not on file  . Highest education level: Not on file  Occupational History  . Not on file  Tobacco Use  . Smoking status: Never Smoker  . Smokeless tobacco: Never Used  Substance and Sexual Activity  . Alcohol use: No    Comment: 07/10/2016 per pt no  . Drug use: No    Comment: 8/3/2017per  pt no   . Sexual activity: Never  Other Topics Concern  . Not on file  Social History Narrative  . Not on file   Social Determinants of Health   Financial Resource Strain:   . Difficulty of Paying Living Expenses:   Food Insecurity:   . Worried About Charity fundraiser in the Last Year:   . Arboriculturist in the Last Year:   Transportation Needs:   . Film/video editor (Medical):   Marland Kitchen Lack of Transportation (Non-Medical):   Physical Activity:   . Days of Exercise per Week:   . Minutes of Exercise per Session:   Stress:   . Feeling of Stress :   Social Connections:   . Frequency of Communication with Friends and Family:   . Frequency of Social Gatherings with Friends and Family:   . Attends Religious Services:   . Active Member of Clubs or Organizations:   . Attends Archivist Meetings:   Marland Kitchen Marital Status:     Allergies:  Allergies  Allergen Reactions  . Morphine And Related Other (See Comments)    Hypotension  . Other     Preservative in latanoprost causes redness, pt uses preservative free latanoprost   . Paxil [Paroxetine Hcl]     unknown  . Latex  Rash    Metabolic Disorder Labs: No results found for: HGBA1C, MPG No results found for: PROLACTIN No results found for: CHOL, TRIG, HDL, CHOLHDL, VLDL, LDLCALC No results found for: TSH  Therapeutic Level Labs: No results found for: LITHIUM No results found for: VALPROATE No components found for:  CBMZ  Current Medications: Current Outpatient Medications  Medication Sig Dispense Refill  . acetaminophen (TYLENOL 8 HOUR ARTHRITIS PAIN) 650 MG CR tablet Take 650 mg by mouth every 8 (eight) hours as needed for pain.    Marland Kitchen ALPRAZolam (XANAX) 0.25 MG tablet Take 1 tablet (0.25 mg total) by mouth 3 (three) times daily. 90 tablet 2  . alum & mag hydroxide-simeth (MAALOX/MYLANTA) 200-200-20 MG/5ML suspension Take 30 mLs by mouth every 6 (six) hours as needed for indigestion or heartburn.    Marland Kitchen aspirin 81 MG tablet Take 81 mg by mouth at bedtime.     Marland Kitchen atorvastatin (LIPITOR) 10 MG tablet Take 10 mg by mouth every evening.     . busPIRone (BUSPAR) 15 MG tablet Take 1 tablet (15 mg total) by mouth 2 (two) times daily. 60 tablet 2  . calcium carbonate (TUMS - DOSED IN MG ELEMENTAL CALCIUM) 500 MG chewable tablet Chew 1 tablet by mouth daily as needed for indigestion or heartburn.    Marland Kitchen ibuprofen (ADVIL,MOTRIN) 200 MG tablet Take 200 mg by mouth 2 (two) times daily as needed for moderate pain.     Marland Kitchen latanoprost (XALATAN) 0.005 % ophthalmic solution Place 1 drop into both eyes at bedtime.    Marland Kitchen levothyroxine (SYNTHROID, LEVOTHROID) 88 MCG tablet Take 88 mcg by mouth daily before breakfast.     . lisinopril (PRINIVIL,ZESTRIL) 10 MG tablet Take 10 mg by mouth every morning.     . loratadine (CLARITIN) 10 MG tablet Take 10 mg by mouth daily as needed for allergies.    . metFORMIN (GLUCOPHAGE) 500 MG tablet Take by mouth.    . metoprolol succinate (TOPROL-XL) 25 MG 24 hr tablet take 1/2 tablet BY MOUTH every morning 3 tablet 0  . metoprolol succinate (TOPROL-XL) 25 MG 24 hr tablet take 1/2 tablet BY MOUTH  every morning 3  tablet 0  . polyethylene glycol (MIRALAX / GLYCOLAX) packet Take 17 g by mouth daily as needed for mild constipation.     Marland Kitchen venlafaxine XR (EFFEXOR-XR) 150 MG 24 hr capsule Take 1 capsule (150 mg total) by mouth daily with breakfast. 30 capsule 2   No current facility-administered medications for this visit.     Musculoskeletal: Strength & Muscle Tone: decreased Gait & Station: unsteady Patient leans: N/A  Psychiatric Specialty Exam: Review of Systems  Musculoskeletal: Positive for gait problem.  All other systems reviewed and are negative.   There were no vitals taken for this visit.There is no height or weight on file to calculate BMI.  General Appearance: NA  Eye Contact:  NA  Speech:  Clear and Coherent  Volume:  Normal  Mood:  Euthymic  Affect:  NA  Thought Process:  Goal Directed  Orientation:  Full (Time, Place, and Person)  Thought Content: WDL   Suicidal Thoughts:  No  Homicidal Thoughts:  No  Memory:  Immediate;   Good Recent;   Good Remote;   Fair  Judgement:  Good  Insight:  Good  Psychomotor Activity:  Decreased  Concentration:  Concentration: Good and Attention Span: Good  Recall:  Good  Fund of Knowledge: Good  Language: Good  Akathisia:  No  Handed:  Right  AIMS (if indicated): not done  Assets:  Communication Skills Desire for Improvement Resilience Social Support Talents/Skills  ADL's:  Intact  Cognition: WNL  Sleep:  Fair   Screenings: GAD-7     Counselor from 04/20/2018 in [place] from 09/15/2016 in [place] [facility]  Total GAD-7 Score  14  10    PHQ2-9     Counselor from 04/20/2018 in [place] [facility]  PHQ-2 Total Score  5  PHQ-9 Total Score  14       Assessment and Plan: This patient is a 79 year old female with a history of severe depression and anxiety.  She has done very well since her  ECT treatments about 3 years ago.  She will continue Xanax 0.25 mg twice daily for anxiety, BuSpar 15 mg twice daily for anxiety and Effexor XR 150 mg daily for depression.  She will return to see me in 3 months   [name], MD 05/08/2020, 10:14 AM

## 2020-05-23 DIAGNOSIS — C55 Malignant neoplasm of uterus, part unspecified: Secondary | ICD-10-CM | POA: Diagnosis not present

## 2020-05-23 DIAGNOSIS — I10 Essential (primary) hypertension: Secondary | ICD-10-CM | POA: Diagnosis not present

## 2020-05-23 DIAGNOSIS — E039 Hypothyroidism, unspecified: Secondary | ICD-10-CM | POA: Diagnosis not present

## 2020-05-23 DIAGNOSIS — E1165 Type 2 diabetes mellitus with hyperglycemia: Secondary | ICD-10-CM | POA: Diagnosis not present

## 2020-05-23 DIAGNOSIS — Z299 Encounter for prophylactic measures, unspecified: Secondary | ICD-10-CM | POA: Diagnosis not present

## 2020-07-05 ENCOUNTER — Other Ambulatory Visit (HOSPITAL_COMMUNITY): Payer: Self-pay | Admitting: Psychiatry

## 2020-07-19 DIAGNOSIS — H401131 Primary open-angle glaucoma, bilateral, mild stage: Secondary | ICD-10-CM | POA: Diagnosis not present

## 2020-08-06 ENCOUNTER — Other Ambulatory Visit (HOSPITAL_COMMUNITY): Payer: Self-pay | Admitting: Psychiatry

## 2020-08-08 ENCOUNTER — Telehealth (INDEPENDENT_AMBULATORY_CARE_PROVIDER_SITE_OTHER): Payer: Medicare Other | Admitting: Psychiatry

## 2020-08-08 ENCOUNTER — Other Ambulatory Visit: Payer: Self-pay

## 2020-08-08 ENCOUNTER — Encounter (HOSPITAL_COMMUNITY): Payer: Self-pay | Admitting: Psychiatry

## 2020-08-08 DIAGNOSIS — F411 Generalized anxiety disorder: Secondary | ICD-10-CM

## 2020-08-08 MED ORDER — ALPRAZOLAM 0.25 MG PO TABS: 0.2500 mg | ORAL_TABLET | Freq: Three times a day (TID) | ORAL | 3 refills | Status: DC

## 2020-08-08 MED ORDER — BUSPIRONE HCL 15 MG PO TABS: 15.0000 mg | ORAL_TABLET | Freq: Two times a day (BID) | ORAL | 3 refills | Status: DC

## 2020-08-08 MED ORDER — VENLAFAXINE HCL ER 150 MG PO CP24: ORAL_CAPSULE | ORAL | 3 refills | Status: DC

## 2020-08-08 NOTE — Progress Notes (Signed)
Virtual Visit via Telephone Note  I connected with Yesenia Reynolds on 08/08/20 at 10:00 AM EDT by telephone and verified that I am speaking with the correct person using two identifiers.   I discussed the limitations, risks, security and privacy concerns of performing an evaluation and management service by telephone and the availability of in person appointments. I also discussed with the patient that there may be a patient responsible charge related to this service. The patient expressed understanding and agreed to proceed.    I discussed the assessment and treatment plan with the patient. The patient was provided an opportunity to ask questions and all were answered. The patient agreed with the plan and demonstrated an understanding of the instructions.   The patient was advised to call back or seek an in-person evaluation if the symptoms worsen or if the condition fails to improve as anticipated.  I provided 15 minutes of non-face-to-face time during this encounter. Location: Provider Home, patient home  [name], MD  [facility] MD/PA/NP OP Progress Note  08/08/2020 10:14 AM Yesenia Reynolds  MRN:  [id number]  Chief Complaint:  Chief Complaint    Depression; Anxiety; Follow-up     BJS:EGBT patient is a 79 year old single white female who livesIn a rest home. 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  The patient was referred by her oncologist for further treatment and assessment of severe anxiety.  The patient states that she has had no prior psychiatric assessment or treatment. She began to have endometrial bleeding in April 2016. She was found to have endometrial cancer and underwent hysterectomy. Following that she had 2 bouts of chemotherapy followed by radiation and eventually intravaginal radiation. She states that she finished all these treatments in October of 2016. She states that she did very well throughout the treatments and  stayed calm and did not have significant problems with nausea and vomiting.  In November 2016 she began to have significant problems with anxiety. She didn't want anyone coming around and visiting her she felt extremely anxious being around people are going out to restaurants or to church. She is been a very gregarious person all her life and her personality totally changed. She denied being depressed sad or crying. She was shaking all the time and couldn't stay alone. She still can't stay alone. She stopped driving her car. She gave up things she enjoyed like going to her nephew's baseball games. She also could not watch anything frightening like police shows on TV because she would become extremely anxious. She is eating fairly well but lost a good deal of weight during her treatments. She is sleeping well and denies nightmares. She states that she had short-term memory loss shortly after treatment but this is gotten much better.  She was tried on Paxil but it was not helpful. Her doctor has her on Xanax 0.25 mg which does help but she takes it as needed. Her gynecologist put her on Lamictal and she is up to 150 mg a day. She thinks this is helped more than anything else because she is less anxious than she was and is slowly starting to drive a little bit and be around a few people at a time. Her hand still shake at times. She would like to get out and do all the things she used to do such as drive on her own go to baseball games enjoy her friends and church but she  is beginning to realize that this is going to be a slow process. She denies suicidal ideation or auditory or visual hallucinations  The patient returns for follow-up after 3 months.  She states she is doing exceedingly well.  She is getting out driving on her own on an almost daily basis.  She states she has virtually no anxiety.  She is sleeping well.  She has made new friends at the rest home.  She has received her vaccinations for  coronavirus and does not seem particularly frightened about it and is ready to get her booster shot.  She is very upbeat.    Visit Diagnosis:    ICD-10-CM   1. Generalized anxiety disorder  F41.1 ALPRAZolam (XANAX) 0.25 MG tablet    Past Psychiatric History: Hospitalization about 3 years ago for depression and anxiety which responded to ECT treatment  Past Medical History:  Past Medical History:  Diagnosis Date  . Agoraphobia with panic attacks 08/07/2016  . Anxiety   . Arthritis   . Atrial fibrillation, currently in sinus rhythm   . Cancer [facility])    Endometrial  . Depression   . Endometrial cancer, FIGO stage IIIC ([place]) 08/08/2015  . Essential hypertension, benign   . GERD (gastroesophageal reflux disease)   . Hypothyroidism   . Mixed hyperlipidemia   . Palpitations   . Spinal stenosis   . Spinal stenosis of lumbar region at multiple levels 08/07/2016  . Type 2 diabetes mellitus ([place])     Past Surgical History:  Procedure Laterality Date  . ABDOMINAL HYSTERECTOMY    . BREAST BIOPSY     benign  . CATARACT EXTRACTION W/PHACO Left 03/31/2017   Procedure: CATARACT EXTRACTION PHACO AND INTRAOCULAR LENS PLACEMENT (IOC);  Surgeon: Rutherford Guys, MD;  Location: [facility];  Service: Ophthalmology;  Laterality: Left;  CDE: 8.67  . CATARACT EXTRACTION W/PHACO Right 04/14/2017   Procedure: CATARACT EXTRACTION PHACO AND INTRAOCULAR LENS PLACEMENT RIGHT EYE;  Surgeon: Rutherford Guys, MD;  Location: [facility];  Service: Ophthalmology;  Laterality: Right;  CDE: 8.52  . LIPOMA EXCISION    . Multiple dental extractions    . PORT-A-CATH REMOVAL Right 09/02/2017   Procedure: MINOR REMOVAL PORT-A-CATH;  Surgeon: Aviva Signs, MD;  Location: [facility];  Service: General;  Laterality: Right;  . TONSILLECTOMY AND ADENOIDECTOMY    . TOTAL KNEE ARTHROPLASTY  08/04/2012   Procedure: TOTAL KNEE ARTHROPLASTY;  Surgeon: Gearlean Alf, MD;  Location: [facility];  Service: Orthopedics;  Laterality: Left;    Family  Psychiatric History: see below  Family History:  Family History  Problem Relation Age of Onset  . Stroke Mother   . Heart failure Mother   . Hypertension Father   . Coronary artery disease Father     Social History:  Social History   Socioeconomic History  . Marital status: Single    Spouse name: Not on file  . Number of children: Not on file  . Years of education: Not on file  . Highest education level: Not on file  Occupational History  . Not on file  Tobacco Use  . Smoking status: Never Smoker  . Smokeless tobacco: Never Used  Substance and Sexual Activity  . Alcohol use: No    Comment: 07/10/2016 per pt no  . Drug use: No    Comment: 8/3/2017per pt no   . Sexual activity: Never  Other Topics Concern  . Not on file  Social History Narrative  . Not on file  Social Determinants of Health   Financial Resource Strain:   . Difficulty of Paying Living Expenses: Not on file  Food Insecurity:   . Worried About Charity fundraiser in the Last Year: Not on file  . Ran Out of Food in the Last Year: Not on file  Transportation Needs:   . Lack of Transportation (Medical): Not on file  . Lack of Transportation (Non-Medical): Not on file  Physical Activity:   . Days of Exercise per Week: Not on file  . Minutes of Exercise per Session: Not on file  Stress:   . Feeling of Stress : Not on file  Social Connections:   . Frequency of Communication with Friends and Family: Not on file  . Frequency of Social Gatherings with Friends and Family: Not on file  . Attends Religious Services: Not on file  . Active Member of Clubs or Organizations: Not on file  . Attends Archivist Meetings: Not on file  . Marital Status: Not on file    Allergies:  Allergies  Allergen Reactions  . Morphine And Related Other (See Comments)    Hypotension  . Other     Preservative in latanoprost causes redness, pt uses preservative free latanoprost   . Paxil [Paroxetine Hcl]     unknown   . Latex Rash    Metabolic Disorder Labs: No results found for: HGBA1C, MPG No results found for: PROLACTIN No results found for: CHOL, TRIG, HDL, CHOLHDL, VLDL, LDLCALC No results found for: TSH  Therapeutic Level Labs: No results found for: LITHIUM No results found for: VALPROATE No components found for:  CBMZ  Current Medications: Current Outpatient Medications  Medication Sig Dispense Refill  . acetaminophen (TYLENOL 8 HOUR ARTHRITIS PAIN) 650 MG CR tablet Take 650 mg by mouth every 8 (eight) hours as needed for pain.    Marland Kitchen ALPRAZolam (XANAX) 0.25 MG tablet Take 1 tablet (0.25 mg total) by mouth 3 (three) times daily. 90 tablet 3  . alum & mag hydroxide-simeth (MAALOX/MYLANTA) 200-200-20 MG/5ML suspension Take 30 mLs by mouth every 6 (six) hours as needed for indigestion or heartburn.    Marland Kitchen aspirin 81 MG tablet Take 81 mg by mouth at bedtime.     Marland Kitchen atorvastatin (LIPITOR) 10 MG tablet Take 10 mg by mouth every evening.     . busPIRone (BUSPAR) 15 MG tablet Take 1 tablet (15 mg total) by mouth 2 (two) times daily. 60 tablet 3  . calcium carbonate (TUMS - DOSED IN MG ELEMENTAL CALCIUM) 500 MG chewable tablet Chew 1 tablet by mouth daily as needed for indigestion or heartburn.    Marland Kitchen ibuprofen (ADVIL,MOTRIN) 200 MG tablet Take 200 mg by mouth 2 (two) times daily as needed for moderate pain.     Marland Kitchen latanoprost (XALATAN) 0.005 % ophthalmic solution Place 1 drop into both eyes at bedtime.    Marland Kitchen levothyroxine (SYNTHROID, LEVOTHROID) 88 MCG tablet Take 88 mcg by mouth daily before breakfast.     . lisinopril (PRINIVIL,ZESTRIL) 10 MG tablet Take 10 mg by mouth every morning.     . loratadine (CLARITIN) 10 MG tablet Take 10 mg by mouth daily as needed for allergies.    . metFORMIN (GLUCOPHAGE) 500 MG tablet Take by mouth.    . metoprolol succinate (TOPROL-XL) 25 MG 24 hr tablet take 1/2 tablet BY MOUTH every morning 3 tablet 0  . metoprolol succinate (TOPROL-XL) 25 MG 24 hr tablet take 1/2 tablet  BY MOUTH every morning 3  tablet 0  . polyethylene glycol (MIRALAX / GLYCOLAX) packet Take 17 g by mouth daily as needed for mild constipation.     Marland Kitchen venlafaxine XR (EFFEXOR-XR) 150 MG 24 hr capsule TAKE ONE CAPSULE BY MOUTH DAILY with breakfast 30 capsule 3   No current facility-administered medications for this visit.     Musculoskeletal: Strength & Muscle Tone: decreased Gait & Station: unsteady Patient leans: N/A  Psychiatric Specialty Exam: Review of Systems  Musculoskeletal: Positive for arthralgias and back pain.  All other systems reviewed and are negative.   There were no vitals taken for this visit.There is no height or weight on file to calculate BMI.  General Appearance: NA  Eye Contact:  NA  Speech:  Clear and Coherent  Volume:  Normal  Mood:  Euthymic  Affect:  NA  Thought Process:  Goal Directed  Orientation:  Full (Time, Place, and Person)  Thought Content: WDL   Suicidal Thoughts:  No  Homicidal Thoughts:  No  Memory:  Immediate;   Good Recent;   Good Remote;   Good  Judgement:  Good  Insight:  Good  Psychomotor Activity:  Normal  Concentration:  Concentration: Good and Attention Span: Good  Recall:  Good  Fund of Knowledge: Good  Language: Good  Akathisia:  No  Handed:  Right  AIMS (if indicated): not done  Assets:  Communication Skills Desire for Improvement Resilience Social Support Talents/Skills  ADL's:  Intact  Cognition: WNL  Sleep:  Good   Screenings: GAD-7     Counselor from 04/20/2018 in [place] from 09/15/2016 in [place] [facility]  Total GAD-7 Score 14 10    PHQ2-9     Counselor from 04/20/2018 in [place] [facility]  PHQ-2 Total Score 5  PHQ-9 Total Score 14       Assessment and Plan: This patient is a 79 year old female with a history of severe depression and anxiety she continues to do very well.   She will continue Xanax 0.25 mg 3 times daily for anxiety, BuSpar 15 mg twice daily for anxiety and Effexor XR 150 mg daily for depression.  She will return to see me in 3 months   [name], MD 08/08/2020, 10:14 AM

## 2020-09-06 DIAGNOSIS — E1165 Type 2 diabetes mellitus with hyperglycemia: Secondary | ICD-10-CM | POA: Diagnosis not present

## 2020-09-06 DIAGNOSIS — E039 Hypothyroidism, unspecified: Secondary | ICD-10-CM | POA: Diagnosis not present

## 2020-09-06 DIAGNOSIS — Z299 Encounter for prophylactic measures, unspecified: Secondary | ICD-10-CM | POA: Diagnosis not present

## 2020-09-06 DIAGNOSIS — I10 Essential (primary) hypertension: Secondary | ICD-10-CM | POA: Diagnosis not present

## 2020-09-06 DIAGNOSIS — Z6837 Body mass index (BMI) 37.0-37.9, adult: Secondary | ICD-10-CM | POA: Diagnosis not present

## 2020-09-06 DIAGNOSIS — C55 Malignant neoplasm of uterus, part unspecified: Secondary | ICD-10-CM | POA: Diagnosis not present

## 2020-10-16 ENCOUNTER — Telehealth (HOSPITAL_COMMUNITY): Payer: Self-pay | Admitting: Psychiatry

## 2020-10-16 NOTE — Telephone Encounter (Signed)
Called pt to schedule F/U appt, lvm

## 2020-10-24 DIAGNOSIS — Z79899 Other long term (current) drug therapy: Secondary | ICD-10-CM | POA: Diagnosis not present

## 2020-10-24 DIAGNOSIS — E559 Vitamin D deficiency, unspecified: Secondary | ICD-10-CM | POA: Diagnosis not present

## 2020-10-24 DIAGNOSIS — E039 Hypothyroidism, unspecified: Secondary | ICD-10-CM | POA: Diagnosis not present

## 2020-10-24 DIAGNOSIS — E1165 Type 2 diabetes mellitus with hyperglycemia: Secondary | ICD-10-CM | POA: Diagnosis not present

## 2020-10-24 DIAGNOSIS — E785 Hyperlipidemia, unspecified: Secondary | ICD-10-CM | POA: Diagnosis not present

## 2020-10-25 DIAGNOSIS — I10 Essential (primary) hypertension: Secondary | ICD-10-CM | POA: Diagnosis not present

## 2020-10-25 DIAGNOSIS — Z1339 Encounter for screening examination for other mental health and behavioral disorders: Secondary | ICD-10-CM | POA: Diagnosis not present

## 2020-10-25 DIAGNOSIS — Z1331 Encounter for screening for depression: Secondary | ICD-10-CM | POA: Diagnosis not present

## 2020-10-25 DIAGNOSIS — C55 Malignant neoplasm of uterus, part unspecified: Secondary | ICD-10-CM | POA: Diagnosis not present

## 2020-10-25 DIAGNOSIS — Z0000 Encounter for general adult medical examination without abnormal findings: Secondary | ICD-10-CM | POA: Diagnosis not present

## 2020-10-25 DIAGNOSIS — Z7189 Other specified counseling: Secondary | ICD-10-CM | POA: Diagnosis not present

## 2020-10-25 DIAGNOSIS — Z6837 Body mass index (BMI) 37.0-37.9, adult: Secondary | ICD-10-CM | POA: Diagnosis not present

## 2020-10-25 DIAGNOSIS — Z299 Encounter for prophylactic measures, unspecified: Secondary | ICD-10-CM | POA: Diagnosis not present

## 2020-10-25 DIAGNOSIS — E1165 Type 2 diabetes mellitus with hyperglycemia: Secondary | ICD-10-CM | POA: Diagnosis not present

## 2020-10-30 DIAGNOSIS — Z23 Encounter for immunization: Secondary | ICD-10-CM | POA: Diagnosis not present

## 2020-11-14 DIAGNOSIS — I10 Essential (primary) hypertension: Secondary | ICD-10-CM | POA: Diagnosis not present

## 2020-11-14 DIAGNOSIS — Z6837 Body mass index (BMI) 37.0-37.9, adult: Secondary | ICD-10-CM | POA: Diagnosis not present

## 2020-11-14 DIAGNOSIS — R319 Hematuria, unspecified: Secondary | ICD-10-CM | POA: Diagnosis not present

## 2020-11-14 DIAGNOSIS — N390 Urinary tract infection, site not specified: Secondary | ICD-10-CM | POA: Diagnosis not present

## 2020-11-14 DIAGNOSIS — Z299 Encounter for prophylactic measures, unspecified: Secondary | ICD-10-CM | POA: Diagnosis not present

## 2020-11-14 DIAGNOSIS — C55 Malignant neoplasm of uterus, part unspecified: Secondary | ICD-10-CM | POA: Diagnosis not present

## 2020-11-14 DIAGNOSIS — E1165 Type 2 diabetes mellitus with hyperglycemia: Secondary | ICD-10-CM | POA: Diagnosis not present

## 2020-11-22 DIAGNOSIS — H401131 Primary open-angle glaucoma, bilateral, mild stage: Secondary | ICD-10-CM | POA: Diagnosis not present

## 2020-12-04 ENCOUNTER — Other Ambulatory Visit: Payer: Self-pay

## 2020-12-04 ENCOUNTER — Telehealth (INDEPENDENT_AMBULATORY_CARE_PROVIDER_SITE_OTHER): Payer: Medicare Other | Admitting: Psychiatry

## 2020-12-04 ENCOUNTER — Encounter (HOSPITAL_COMMUNITY): Payer: Self-pay | Admitting: Psychiatry

## 2020-12-04 DIAGNOSIS — F411 Generalized anxiety disorder: Secondary | ICD-10-CM | POA: Diagnosis not present

## 2020-12-04 MED ORDER — ALPRAZOLAM 0.25 MG PO TABS: 0.2500 mg | ORAL_TABLET | Freq: Three times a day (TID) | ORAL | 3 refills | Status: DC

## 2020-12-04 MED ORDER — VENLAFAXINE HCL ER 150 MG PO CP24
ORAL_CAPSULE | ORAL | 3 refills | Status: DC
Start: 2020-12-04 — End: 2021-04-04

## 2020-12-04 MED ORDER — BUSPIRONE HCL 15 MG PO TABS
15.0000 mg | ORAL_TABLET | Freq: Two times a day (BID) | ORAL | 3 refills | Status: DC
Start: 2020-12-04 — End: 2021-04-04

## 2020-12-04 NOTE — Progress Notes (Signed)
Virtual Visit via Telephone Note  I connected with Yesenia Reynolds on 12/04/20 at 10:40 AM EST by telephone and verified that I am speaking with the correct person using two identifiers.  Location: Patient: home Provider: office   I discussed the limitations, risks, security and privacy concerns of performing an evaluation and management service by telephone and the availability of in person appointments. I also discussed with the patient that there may be a patient responsible charge related to this service. The patient expressed understanding and agreed to proceed.    I discussed the assessment and treatment plan with the patient. The patient was provided an opportunity to ask questions and all were answered. The patient agreed with the plan and demonstrated an understanding of the instructions.   The patient was advised to call back or seek an in-person evaluation if the symptoms worsen or if the condition fails to improve as anticipated.  I provided 15 minutes of non-face-to-face time during this encounter.   [name], MD  [facility] MD/PA/NP OP Progress Note  12/04/2020 10:48 AM [name]  MRN:  [id number]  Chief Complaint:  Chief Complaint    Anxiety; Depression; Follow-up     HPI: This patient is a 79 year old single white female who livesin a rest home. 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  The patient was referred by her oncologist for further treatment and assessment of severe anxiety.  The patient states that she has had no prior psychiatric assessment or treatment. She began to have endometrial bleeding in April 2016. She was found to have endometrial cancer and underwent hysterectomy. Following that she had 2 bouts of chemotherapy followed by radiation and eventually intravaginal radiation. She states that she finished all these treatments in October of 2016. She states that she did very well throughout the  treatments and stayed calm and did not have significant problems with nausea and vomiting.  In November 2016 she began to have significant problems with anxiety. She didn't want anyone coming around and visiting her she felt extremely anxious being around people are going out to restaurants or to church. She is been a very gregarious person all her life and her personality totally changed. She denied being depressed sad or crying. She was shaking all the time and couldn't stay alone. She still can't stay alone. She stopped driving her car. She gave up things she enjoyed like going to her nephew's baseball games. She also could not watch anything frightening like police shows on TV because she would become extremely anxious. She is eating fairly well but lost a good deal of weight during her treatments. She is sleeping well and denies nightmares. She states that she had short-term memory loss shortly after treatment but this is gotten much better.  She was tried on Paxil but it was not helpful. Her doctor has her on Xanax 0.25 mg which does help but she takes it as needed. Her gynecologist put her on Lamictal and she is up to 150 mg a day. She thinks this is helped more than anything else because she is less anxious than she was and is slowly starting to drive a little bit and be around a few people at a time. Her hand still shake at times. She would like to get out and do all the things she used to do such as drive on her own go to baseball games enjoy her friends and  church but she is beginning to realize that this is going to be a slow process. She denies suicidal ideation or auditory or visual hallucinations  The patient returns after 4 months.  She continues to do well.  She has gotten all of her vaccinations and her booster shot and she is going out in the community fairly regularly.  Sometimes she just goes out to drive her car around to get away from the rest home.  Her mood has been good and she  denies significant anxiety or depression.  She is sleeping well.  She denies any panic attacks. Visit Diagnosis:    ICD-10-CM   1. Generalized anxiety disorder  F41.1 ALPRAZolam (XANAX) 0.25 MG tablet    Past Psychiatric History: Hospitalization about 3 years ago for depression and anxiety which responded to ECT treatment  Past Medical History:  Past Medical History:  Diagnosis Date  . Agoraphobia with panic attacks 08/07/2016  . Anxiety   . Arthritis   . Atrial fibrillation, currently in sinus rhythm   . Cancer [facility])    Endometrial  . Depression   . Endometrial cancer, FIGO stage IIIC ([facility]) 08/08/2015  . Essential hypertension, benign   . GERD (gastroesophageal reflux disease)   . Hypothyroidism   . Mixed hyperlipidemia   . Palpitations   . Spinal stenosis   . Spinal stenosis of lumbar region at multiple levels 08/07/2016  . Type 2 diabetes mellitus ([facility])     Past Surgical History:  Procedure Laterality Date  . ABDOMINAL HYSTERECTOMY    . BREAST BIOPSY     benign  . CATARACT EXTRACTION W/PHACO Left 03/31/2017   Procedure: CATARACT EXTRACTION PHACO AND INTRAOCULAR LENS PLACEMENT (IOC);  Surgeon: Jethro BolusMark Shapiro, MD;  Location: [facility];  Service: Ophthalmology;  Laterality: Left;  CDE: 8.67  . CATARACT EXTRACTION W/PHACO Right 04/14/2017   Procedure: CATARACT EXTRACTION PHACO AND INTRAOCULAR LENS PLACEMENT RIGHT EYE;  Surgeon: Jethro BolusShapiro, Mark, MD;  Location: [facility];  Service: Ophthalmology;  Laterality: Right;  CDE: 8.52  . LIPOMA EXCISION    . Multiple dental extractions    . PORT-A-CATH REMOVAL Right 09/02/2017   Procedure: MINOR REMOVAL PORT-A-CATH;  Surgeon: Franky MachoJenkins, Mark, MD;  Location: [facility];  Service: General;  Laterality: Right;  . TONSILLECTOMY AND ADENOIDECTOMY    . TOTAL KNEE ARTHROPLASTY  08/04/2012   Procedure: TOTAL KNEE ARTHROPLASTY;  Surgeon: Loanne DrillingFrank V Aluisio, MD;  Location: [facility];  Service: Orthopedics;  Laterality: Left;    Family Psychiatric History: see  below  Family History:  Family History  Problem Relation Age of Onset  . Stroke Mother   . Heart failure Mother   . Hypertension Father   . Coronary artery disease Father     Social History:  Social History   Socioeconomic History  . Marital status: Single    Spouse name: Not on file  . Number of children: Not on file  . Years of education: Not on file  . Highest education level: Not on file  Occupational History  . Not on file  Tobacco Use  . Smoking status: Never Smoker  . Smokeless tobacco: Never Used  Substance and Sexual Activity  . Alcohol use: No    Comment: 07/10/2016 per pt no  . Drug use: No    Comment: 8/3/2017per pt no   . Sexual activity: Never  Other Topics Concern  . Not on file  Social History Narrative  . Not on file   Social Determinants of Health  Financial Resource Strain: Not on file  Food Insecurity: Not on file  Transportation Needs: Not on file  Physical Activity: Not on file  Stress: Not on file  Social Connections: Not on file    Allergies:  Allergies  Allergen Reactions  . Morphine And Related Other (See Comments)    Hypotension  . Other     Preservative in latanoprost causes redness, pt uses preservative free latanoprost   . Paxil [Paroxetine Hcl]     unknown  . Latex Rash    Metabolic Disorder Labs: No results found for: HGBA1C, MPG No results found for: PROLACTIN No results found for: CHOL, TRIG, HDL, CHOLHDL, VLDL, LDLCALC No results found for: TSH  Therapeutic Level Labs: No results found for: LITHIUM No results found for: VALPROATE No components found for:  CBMZ  Current Medications: Current Outpatient Medications  Medication Sig Dispense Refill  . acetaminophen (TYLENOL 8 HOUR ARTHRITIS PAIN) 650 MG CR tablet Take 650 mg by mouth every 8 (eight) hours as needed for pain.    Marland Kitchen ALPRAZolam (XANAX) 0.25 MG tablet Take 1 tablet (0.25 mg total) by mouth 3 (three) times daily. 90 tablet 3  . alum & mag  hydroxide-simeth (MAALOX/MYLANTA) 200-200-20 MG/5ML suspension Take 30 mLs by mouth every 6 (six) hours as needed for indigestion or heartburn.    Marland Kitchen aspirin 81 MG tablet Take 81 mg by mouth at bedtime.     Marland Kitchen atorvastatin (LIPITOR) 10 MG tablet Take 10 mg by mouth every evening.     . busPIRone (BUSPAR) 15 MG tablet Take 1 tablet (15 mg total) by mouth 2 (two) times daily. 60 tablet 3  . calcium carbonate (TUMS - DOSED IN MG ELEMENTAL CALCIUM) 500 MG chewable tablet Chew 1 tablet by mouth daily as needed for indigestion or heartburn.    Marland Kitchen ibuprofen (ADVIL,MOTRIN) 200 MG tablet Take 200 mg by mouth 2 (two) times daily as needed for moderate pain.     Marland Kitchen latanoprost (XALATAN) 0.005 % ophthalmic solution Place 1 drop into both eyes at bedtime.    Marland Kitchen levothyroxine (SYNTHROID, LEVOTHROID) 88 MCG tablet Take 88 mcg by mouth daily before breakfast.     . lisinopril (PRINIVIL,ZESTRIL) 10 MG tablet Take 10 mg by mouth every morning.     . loratadine (CLARITIN) 10 MG tablet Take 10 mg by mouth daily as needed for allergies.    . metFORMIN (GLUCOPHAGE) 500 MG tablet Take by mouth.    . metoprolol succinate (TOPROL-XL) 25 MG 24 hr tablet take 1/2 tablet BY MOUTH every morning 3 tablet 0  . metoprolol succinate (TOPROL-XL) 25 MG 24 hr tablet take 1/2 tablet BY MOUTH every morning 3 tablet 0  . polyethylene glycol (MIRALAX / GLYCOLAX) packet Take 17 g by mouth daily as needed for mild constipation.     Marland Kitchen venlafaxine XR (EFFEXOR-XR) 150 MG 24 hr capsule TAKE ONE CAPSULE BY MOUTH DAILY with breakfast 30 capsule 3   No current facility-administered medications for this visit.     Musculoskeletal: Strength & Muscle Tone: within normal limits Gait & Station: normal Patient leans: N/A  Psychiatric Specialty Exam: Review of Systems  Musculoskeletal: Positive for arthralgias and back pain.  All other systems reviewed and are negative.   There were no vitals taken for this visit.There is no height or weight on  file to calculate BMI.  General Appearance: NA  Eye Contact:  NA  Speech:  Clear and Coherent  Volume:  Normal  Mood:  Euthymic  Affect:  NA  Thought Process:  Goal Directed  Orientation:  Full (Time, Place, and Person)  Thought Content: WDL   Suicidal Thoughts:  No  Homicidal Thoughts:  No  Memory:  Immediate;   Good Recent;   Good Remote;   Good  Judgement:  Good  Insight:  Fair  Psychomotor Activity:  Normal  Concentration:  Concentration: Good and Attention Span: Good  Recall:  Good  Fund of Knowledge: Good  Language: Good  Akathisia:  No  Handed:  Right  AIMS (if indicated): not done  Assets:  Communication Skills Desire for Improvement Resilience Social Support Talents/Skills  ADL's:  Intact  Cognition: WNL  Sleep:  Good   Screenings: GAD-7   Advertising copywriter from [facility]/20/2018 [facility] Counselor from 09/15/2016 in [facility]  Total GAD-7 Score 14 10    PHQ2-9   Flowsheet Row Counselor from 04/20/2018 in [facility]  PHQ-2 Total Score 5  PHQ-9 Total Score 14       Assessment and Plan: This patient is a 79 year old female with a history of severe depression and anxiety.  She continues to do very well on her current regimen.  She will continue Xanax 0.25 mg 3 times daily for anxiety, BuSpar 15 mg twice daily for anxiety and Effexor XR 150 mg daily for depression.  She will return to see me in 4 months   [name], MD 12/04/2020, 10:48 AM

## 2020-12-13 DIAGNOSIS — E1129 Type 2 diabetes mellitus with other diabetic kidney complication: Secondary | ICD-10-CM | POA: Diagnosis not present

## 2020-12-13 DIAGNOSIS — Z299 Encounter for prophylactic measures, unspecified: Secondary | ICD-10-CM | POA: Diagnosis not present

## 2020-12-13 DIAGNOSIS — R809 Proteinuria, unspecified: Secondary | ICD-10-CM | POA: Diagnosis not present

## 2020-12-13 DIAGNOSIS — F17210 Nicotine dependence, cigarettes, uncomplicated: Secondary | ICD-10-CM | POA: Diagnosis not present

## 2020-12-13 DIAGNOSIS — Z6837 Body mass index (BMI) 37.0-37.9, adult: Secondary | ICD-10-CM | POA: Diagnosis not present

## 2020-12-13 DIAGNOSIS — I10 Essential (primary) hypertension: Secondary | ICD-10-CM | POA: Diagnosis not present

## 2020-12-13 DIAGNOSIS — E1165 Type 2 diabetes mellitus with hyperglycemia: Secondary | ICD-10-CM | POA: Diagnosis not present

## 2021-02-04 DIAGNOSIS — I10 Essential (primary) hypertension: Secondary | ICD-10-CM | POA: Diagnosis not present

## 2021-02-04 DIAGNOSIS — E1165 Type 2 diabetes mellitus with hyperglycemia: Secondary | ICD-10-CM | POA: Diagnosis not present

## 2021-02-04 DIAGNOSIS — E785 Hyperlipidemia, unspecified: Secondary | ICD-10-CM | POA: Diagnosis not present

## 2021-02-04 DIAGNOSIS — N3281 Overactive bladder: Secondary | ICD-10-CM | POA: Diagnosis not present

## 2021-03-25 DIAGNOSIS — E1165 Type 2 diabetes mellitus with hyperglycemia: Secondary | ICD-10-CM | POA: Diagnosis not present

## 2021-03-25 DIAGNOSIS — F339 Major depressive disorder, recurrent, unspecified: Secondary | ICD-10-CM | POA: Diagnosis not present

## 2021-03-25 DIAGNOSIS — I10 Essential (primary) hypertension: Secondary | ICD-10-CM | POA: Diagnosis not present

## 2021-03-25 DIAGNOSIS — Z299 Encounter for prophylactic measures, unspecified: Secondary | ICD-10-CM | POA: Diagnosis not present

## 2021-03-28 DIAGNOSIS — Z7984 Long term (current) use of oral hypoglycemic drugs: Secondary | ICD-10-CM | POA: Diagnosis not present

## 2021-03-28 DIAGNOSIS — E119 Type 2 diabetes mellitus without complications: Secondary | ICD-10-CM | POA: Diagnosis not present

## 2021-03-28 DIAGNOSIS — H401131 Primary open-angle glaucoma, bilateral, mild stage: Secondary | ICD-10-CM | POA: Diagnosis not present

## 2021-03-28 DIAGNOSIS — Z961 Presence of intraocular lens: Secondary | ICD-10-CM | POA: Diagnosis not present

## 2021-04-04 ENCOUNTER — Encounter (HOSPITAL_COMMUNITY): Payer: Self-pay | Admitting: Psychiatry

## 2021-04-04 ENCOUNTER — Telehealth (INDEPENDENT_AMBULATORY_CARE_PROVIDER_SITE_OTHER): Payer: Medicare Other | Admitting: Psychiatry

## 2021-04-04 ENCOUNTER — Other Ambulatory Visit: Payer: Self-pay

## 2021-04-04 DIAGNOSIS — F411 Generalized anxiety disorder: Secondary | ICD-10-CM | POA: Diagnosis not present

## 2021-04-04 MED ORDER — ALPRAZOLAM 0.25 MG PO TABS: 0.2500 mg | ORAL_TABLET | Freq: Three times a day (TID) | ORAL | 3 refills | Status: DC

## 2021-04-04 MED ORDER — BUSPIRONE HCL 15 MG PO TABS: 15.0000 mg | ORAL_TABLET | Freq: Two times a day (BID) | ORAL | 3 refills | Status: DC

## 2021-04-04 MED ORDER — VENLAFAXINE HCL ER 150 MG PO CP24: ORAL_CAPSULE | ORAL | 3 refills | Status: DC

## 2021-04-04 NOTE — Progress Notes (Signed)
Virtual Visit via Telephone Note  I connected with Yesenia Reynolds on 04/04/21 at  9:00 AM EDT by telephone and verified that I am speaking with the correct person using two identifiers.  Location: Patient: home Provider: home office   I discussed the limitations, risks, security and privacy concerns of performing an evaluation and management service by telephone and the availability of in person appointments. I also discussed with the patient that there may be a patient responsible charge related to this service. The patient expressed understanding and agreed to proceed.    I discussed the assessment and treatment plan with the patient. The patient was provided an opportunity to ask questions and all were answered. The patient agreed with the plan and demonstrated an understanding of the instructions.   The patient was advised to call back or seek an in-person evaluation if the symptoms worsen or if the condition fails to improve as anticipated.  I provided 15 minutes of non-face-to-face time during this encounter.   [name], MD  [facility] MD/PA/NP OP Progress Note  04/04/2021 9:11 AM Yesenia Reynolds  MRN:  [id number]  Chief Complaint:  Chief Complaint    Anxiety; Depression     HPI: This patient is a 80 year old single white female who livesin a rest home. 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  The patient was referred by her oncologist for further treatment and assessment of severe anxiety.  The patient states that she has had no prior psychiatric assessment or treatment. She began to have endometrial bleeding in April 2016. She was found to have endometrial cancer and underwent hysterectomy. Following that she had 2 bouts of chemotherapy followed by radiation and eventually intravaginal radiation. She states that she finished all these treatments in October of 2016. She states that she did very well throughout the treatments and  stayed calm and did not have significant problems with nausea and vomiting.  In November 2016 she began to have significant problems with anxiety. She didn't want anyone coming around and visiting her she felt extremely anxious being around people are going out to restaurants or to church. She is been a very gregarious person all her life and her personality totally changed. She denied being depressed sad or crying. She was shaking all the time and couldn't stay alone. She still can't stay alone. She stopped driving her car. She gave up things she enjoyed like going to her nephew's baseball games. She also could not watch anything frightening like police shows on TV because she would become extremely anxious. She is eating fairly well but lost a good deal of weight during her treatments. She is sleeping well and denies nightmares. She states that she had short-term memory loss shortly after treatment but this is gotten much better.  She was tried on Paxil but it was not helpful. Her doctor has her on Xanax 0.25 mg which does help but she takes it as needed. Her gynecologist put her on Lamictal and she is up to 150 mg a day. She thinks this is helped more than anything else because she is less anxious than she was and is slowly starting to drive a little bit and be around a few people at a time. Her hand still shake at times. She would like to get out and do all the things she used to do such as drive on her own go to baseball games enjoy her friends  and church but she is beginning to realize that this is going to be a slow process. She denies suicidal ideation or auditory or visual hallucinations  The patient returns for follow-up after 4 months.  She states that she continues to do very well.  She is still driving on her own and going to places that she enjoys.  Her mood has been good and she denies significant anxiety.  She is sleeping and eating well.  She still has back pain and needs to use her walker  but her mobility is good. Visit Diagnosis:    ICD-10-CM   1. Generalized anxiety disorder  F41.1 ALPRAZolam (XANAX) 0.25 MG tablet    Past Psychiatric History: Hospitalization about 4 years ago for depression and anxiety which responded to ECT treatment  Past Medical History:  Past Medical History:  Diagnosis Date  . Agoraphobia with panic attacks 08/07/2016  . Anxiety   . Arthritis   . Atrial fibrillation, currently in sinus rhythm   . Cancer [facility])    Endometrial  . Depression   . Endometrial cancer, FIGO stage IIIC ([place]) 08/08/2015  . Essential hypertension, benign   . GERD (gastroesophageal reflux disease)   . Hypothyroidism   . Mixed hyperlipidemia   . Palpitations   . Spinal stenosis   . Spinal stenosis of lumbar region at multiple levels 08/07/2016  . Type 2 diabetes mellitus ([place])     Past Surgical History:  Procedure Laterality Date  . ABDOMINAL HYSTERECTOMY    . BREAST BIOPSY     benign  . CATARACT EXTRACTION W/PHACO Left 03/31/2017   Procedure: CATARACT EXTRACTION PHACO AND INTRAOCULAR LENS PLACEMENT (IOC);  Surgeon: Rutherford Guys, MD;  Location: [facility];  Service: Ophthalmology;  Laterality: Left;  CDE: 8.67  . CATARACT EXTRACTION W/PHACO Right 04/14/2017   Procedure: CATARACT EXTRACTION PHACO AND INTRAOCULAR LENS PLACEMENT RIGHT EYE;  Surgeon: Rutherford Guys, MD;  Location: [facility];  Service: Ophthalmology;  Laterality: Right;  CDE: 8.52  . LIPOMA EXCISION    . Multiple dental extractions    . PORT-A-CATH REMOVAL Right 09/02/2017   Procedure: MINOR REMOVAL PORT-A-CATH;  Surgeon: Aviva Signs, MD;  Location: [facility];  Service: General;  Laterality: Right;  . TONSILLECTOMY AND ADENOIDECTOMY    . TOTAL KNEE ARTHROPLASTY  08/04/2012   Procedure: TOTAL KNEE ARTHROPLASTY;  Surgeon: Gearlean Alf, MD;  Location: [facility];  Service: Orthopedics;  Laterality: Left;    Family Psychiatric History: none  Family History:  Family History  Problem Relation Age of Onset  . Stroke  Mother   . Heart failure Mother   . Hypertension Father   . Coronary artery disease Father     Social History:  Social History   Socioeconomic History  . Marital status: Single    Spouse name: Not on file  . Number of children: Not on file  . Years of education: Not on file  . Highest education level: Not on file  Occupational History  . Not on file  Tobacco Use  . Smoking status: Never Smoker  . Smokeless tobacco: Never Used  Substance and Sexual Activity  . Alcohol use: No    Comment: 07/10/2016 per pt no  . Drug use: No    Comment: 8/3/2017per pt no   . Sexual activity: Never  Other Topics Concern  . Not on file  Social History Narrative  . Not on file   Social Determinants of Health   Financial Resource Strain: Not on file  Food  Insecurity: Not on file  Transportation Needs: Not on file  Physical Activity: Not on file  Stress: Not on file  Social Connections: Not on file    Allergies:  Allergies  Allergen Reactions  . Morphine And Related Other (See Comments)    Hypotension  . Other     Preservative in latanoprost causes redness, pt uses preservative free latanoprost   . Paxil [Paroxetine Hcl]     unknown  . Latex Rash    Metabolic Disorder Labs: No results found for: HGBA1C, MPG No results found for: PROLACTIN No results found for: CHOL, TRIG, HDL, CHOLHDL, VLDL, LDLCALC No results found for: TSH  Therapeutic Level Labs: No results found for: LITHIUM No results found for: VALPROATE No components found for:  CBMZ  Current Medications: Current Outpatient Medications  Medication Sig Dispense Refill  . acetaminophen (TYLENOL 8 HOUR ARTHRITIS PAIN) 650 MG CR tablet Take 650 mg by mouth every 8 (eight) hours as needed for pain.    Marland Kitchen ALPRAZolam (XANAX) 0.25 MG tablet Take 1 tablet (0.25 mg total) by mouth 3 (three) times daily. 90 tablet 3  . alum & mag hydroxide-simeth (MAALOX/MYLANTA) 200-200-20 MG/5ML suspension Take 30 mLs by mouth every 6 (six)  hours as needed for indigestion or heartburn.    Marland Kitchen aspirin 81 MG tablet Take 81 mg by mouth at bedtime.     Marland Kitchen atorvastatin (LIPITOR) 10 MG tablet Take 10 mg by mouth every evening.     . busPIRone (BUSPAR) 15 MG tablet Take 1 tablet (15 mg total) by mouth 2 (two) times daily. 60 tablet 3  . calcium carbonate (TUMS - DOSED IN MG ELEMENTAL CALCIUM) 500 MG chewable tablet Chew 1 tablet by mouth daily as needed for indigestion or heartburn.    Marland Kitchen ibuprofen (ADVIL,MOTRIN) 200 MG tablet Take 200 mg by mouth 2 (two) times daily as needed for moderate pain.     Marland Kitchen latanoprost (XALATAN) 0.005 % ophthalmic solution Place 1 drop into both eyes at bedtime.    Marland Kitchen levothyroxine (SYNTHROID, LEVOTHROID) 88 MCG tablet Take 88 mcg by mouth daily before breakfast.     . lisinopril (PRINIVIL,ZESTRIL) 10 MG tablet Take 10 mg by mouth every morning.     . loratadine (CLARITIN) 10 MG tablet Take 10 mg by mouth daily as needed for allergies.    . metFORMIN (GLUCOPHAGE) 500 MG tablet Take by mouth.    . metoprolol succinate (TOPROL-XL) 25 MG 24 hr tablet take 1/2 tablet BY MOUTH every morning 3 tablet 0  . metoprolol succinate (TOPROL-XL) 25 MG 24 hr tablet take 1/2 tablet BY MOUTH every morning 3 tablet 0  . polyethylene glycol (MIRALAX / GLYCOLAX) packet Take 17 g by mouth daily as needed for mild constipation.     Marland Kitchen venlafaxine XR (EFFEXOR-XR) 150 MG 24 hr capsule TAKE ONE CAPSULE BY MOUTH DAILY with breakfast 30 capsule 3   No current facility-administered medications for this visit.     Musculoskeletal: Strength & Muscle Tone: within normal limits Gait & Station: normal Patient leans: N/A  Psychiatric Specialty Exam: Review of Systems  Genitourinary: Positive for enuresis.  Musculoskeletal: Positive for arthralgias and back pain.  All other systems reviewed and are negative.   There were no vitals taken for this visit.There is no height or weight on file to calculate BMI.  General Appearance: NA  Eye  Contact:  NA  Speech:  Clear and Coherent  Volume:  Normal  Mood:  Euthymic  Affect:  Appropriate  and Congruent  Thought Process:  Goal Directed  Orientation:  Full (Time, Place, and Person)  Thought Content: WDL   Suicidal Thoughts:  No  Homicidal Thoughts:  No  Memory:  Immediate;   Good Recent;   Good Remote;   Fair  Judgement:  Good  Insight:  Good  Psychomotor Activity:  Normal  Concentration:  Concentration: Good and Attention Span: Good  Recall:  Good  Fund of Knowledge: Good  Language: Good  Akathisia:  No  Handed:  Right  AIMS (if indicated): not done  Assets:  Communication Skills Desire for Improvement Resilience Social Support Talents/Skills  ADL's:  Intact  Cognition: WNL  Sleep:  Good   Screenings: GAD-7   Health and safety inspector from 04/20/2018 in [place] [facility] from 09/15/2016 in [place] [facility]  Total GAD-7 Score 14 10    PHQ2-9   [place] from 04/04/2021 in [place] [facility] from 04/20/2018 in [place] [facility]  PHQ-2 Total Score 0 5  PHQ-9 Total Score -- 14    Flowsheet Row Telemedicine from 04/04/2021 in [place] No Risk       Assessment and Plan: This patient is a 80 year old female with a history of severe depression and anxiety.  She is doing very well on her current regimen.  She will continue Xanax 0.25 mg 3 times daily for anxiety, BuSpar 15 mg twice daily for anxiety and Effexor XR 150 mg daily for depression.  She will return to see me in 4 months   [name], MD 04/04/2021, 9:11 AM

## 2021-04-06 DIAGNOSIS — I10 Essential (primary) hypertension: Secondary | ICD-10-CM | POA: Diagnosis not present

## 2021-04-06 DIAGNOSIS — E1165 Type 2 diabetes mellitus with hyperglycemia: Secondary | ICD-10-CM | POA: Diagnosis not present

## 2021-04-06 DIAGNOSIS — N3281 Overactive bladder: Secondary | ICD-10-CM | POA: Diagnosis not present

## 2021-04-06 DIAGNOSIS — E785 Hyperlipidemia, unspecified: Secondary | ICD-10-CM | POA: Diagnosis not present

## 2021-04-17 DIAGNOSIS — R922 Inconclusive mammogram: Secondary | ICD-10-CM | POA: Diagnosis not present

## 2021-04-17 DIAGNOSIS — R921 Mammographic calcification found on diagnostic imaging of breast: Secondary | ICD-10-CM | POA: Diagnosis not present

## 2021-04-17 DIAGNOSIS — N644 Mastodynia: Secondary | ICD-10-CM | POA: Diagnosis not present

## 2021-05-06 DIAGNOSIS — E1165 Type 2 diabetes mellitus with hyperglycemia: Secondary | ICD-10-CM | POA: Diagnosis not present

## 2021-05-06 DIAGNOSIS — N3281 Overactive bladder: Secondary | ICD-10-CM | POA: Diagnosis not present

## 2021-05-06 DIAGNOSIS — E785 Hyperlipidemia, unspecified: Secondary | ICD-10-CM | POA: Diagnosis not present

## 2021-05-06 DIAGNOSIS — I10 Essential (primary) hypertension: Secondary | ICD-10-CM | POA: Diagnosis not present

## 2021-06-03 ENCOUNTER — Other Ambulatory Visit (HOSPITAL_COMMUNITY): Payer: Self-pay | Admitting: Psychiatry

## 2021-06-03 DIAGNOSIS — F411 Generalized anxiety disorder: Secondary | ICD-10-CM

## 2021-07-02 DIAGNOSIS — Z713 Dietary counseling and surveillance: Secondary | ICD-10-CM | POA: Diagnosis not present

## 2021-07-02 DIAGNOSIS — Z299 Encounter for prophylactic measures, unspecified: Secondary | ICD-10-CM | POA: Diagnosis not present

## 2021-07-02 DIAGNOSIS — I10 Essential (primary) hypertension: Secondary | ICD-10-CM | POA: Diagnosis not present

## 2021-07-02 DIAGNOSIS — E1165 Type 2 diabetes mellitus with hyperglycemia: Secondary | ICD-10-CM | POA: Diagnosis not present

## 2021-07-30 DIAGNOSIS — H401131 Primary open-angle glaucoma, bilateral, mild stage: Secondary | ICD-10-CM | POA: Diagnosis not present

## 2021-08-01 ENCOUNTER — Other Ambulatory Visit (HOSPITAL_COMMUNITY): Payer: Self-pay | Admitting: Psychiatry

## 2021-08-05 ENCOUNTER — Telehealth (INDEPENDENT_AMBULATORY_CARE_PROVIDER_SITE_OTHER): Payer: Medicare Other | Admitting: Psychiatry

## 2021-08-05 ENCOUNTER — Encounter (HOSPITAL_COMMUNITY): Payer: Self-pay | Admitting: Psychiatry

## 2021-08-05 ENCOUNTER — Other Ambulatory Visit: Payer: Self-pay

## 2021-08-05 DIAGNOSIS — F411 Generalized anxiety disorder: Secondary | ICD-10-CM

## 2021-08-05 MED ORDER — VENLAFAXINE HCL ER 150 MG PO CP24: ORAL_CAPSULE | ORAL | 3 refills | Status: DC

## 2021-08-05 MED ORDER — ALPRAZOLAM 0.25 MG PO TABS: 0.2500 mg | ORAL_TABLET | Freq: Three times a day (TID) | ORAL | 3 refills | Status: DC

## 2021-08-05 MED ORDER — BUSPIRONE HCL 15 MG PO TABS: 15.0000 mg | ORAL_TABLET | Freq: Two times a day (BID) | ORAL | 3 refills | Status: DC

## 2021-08-05 NOTE — Progress Notes (Signed)
Virtual Visit via Telephone Note  I connected with Yesenia Reynolds on 08/05/21 at  9:00 AM EDT by telephone and verified that I am speaking with the correct person using two identifiers.  Location: Patient: home Provider: home office   I discussed the limitations, risks, security and privacy concerns of performing an evaluation and management service by telephone and the availability of in person appointments. I also discussed with the patient that there may be a patient responsible charge related to this service. The patient expressed understanding and agreed to proceed.       I discussed the assessment and treatment plan with the patient. The patient was provided an opportunity to ask questions and all were answered. The patient agreed with the plan and demonstrated an understanding of the instructions.   The patient was advised to call back or seek an in-person evaluation if the symptoms worsen or if the condition fails to improve as anticipated.  I provided 15 minutes of non-face-to-face time during this encounter.   Yesenia Spiller, MD  [facility] MD/PA/NP OP Progress Note  08/05/2021 9:14 AM Yesenia Yesenia Reynolds  MRN:  Yesenia Reynolds:[id number]  Chief Complaint:  Chief Complaint   Anxiety; Depression; Follow-up    HPI: This patient is a 80 year old single white female who lives in a rest home. 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  The patient returns for follow-up after 4 months regarding her depression and anxiety.  She states that she is doing quite well.  She is driving on her own and going out to places.  She is attending church and Sunday school.  Her energy is a little bit low and I suggested she have her thyroid checked by her primary doctor since she is on Synthroid.  She is sleeping really well.  She has no new health complaints.  She feels that her current regimen is working well for her depression and anxiety. Visit Diagnosis:    ICD-10-CM    1. Generalized anxiety disorder  F41.1 ALPRAZolam (XANAX) 0.25 MG tablet      Past Psychiatric History: Hospitalization about 4 years ago for depression and anxiety which responded to ECT treatment  Past Medical History:  Past Medical History:  Diagnosis Date   Agoraphobia with panic attacks 08/07/2016   Anxiety    Arthritis    Atrial fibrillation, currently in sinus rhythm    Cancer ([place])    Endometrial   Depression    Endometrial cancer, FIGO stage IIIC ([place]) 08/08/2015   Essential hypertension, benign    GERD (gastroesophageal reflux disease)    Hypothyroidism    Mixed hyperlipidemia    Palpitations    Spinal stenosis    Spinal stenosis of lumbar region at multiple levels 08/07/2016   Type 2 diabetes mellitus ([place])     Past Surgical History:  Procedure Laterality Date   ABDOMINAL HYSTERECTOMY     BREAST BIOPSY     benign   CATARACT EXTRACTION W/PHACO Left 03/31/2017   Procedure: CATARACT EXTRACTION PHACO AND INTRAOCULAR LENS PLACEMENT ([place]);  Surgeon: Rutherford Guys, MD;  Location: [facility];  Service: Ophthalmology;  Laterality: Left;  CDE: 8.67   CATARACT EXTRACTION W/PHACO Right 04/14/2017   Procedure: CATARACT EXTRACTION PHACO AND INTRAOCULAR LENS PLACEMENT RIGHT EYE;  Surgeon: Rutherford Guys, MD;  Location: [facility];  Service: Ophthalmology;  Laterality: Right;  CDE: 8.52   LIPOMA EXCISION     Multiple dental extractions  PORT-A-CATH REMOVAL Right 09/02/2017   Procedure: MINOR REMOVAL PORT-A-CATH;  Surgeon: Aviva Signs, MD;  Location: [facility];  Service: General;  Laterality: Right;   TONSILLECTOMY AND ADENOIDECTOMY     TOTAL KNEE ARTHROPLASTY  08/04/2012   Procedure: TOTAL KNEE ARTHROPLASTY;  Surgeon: Gearlean Alf, MD;  Location: [facility];  Service: Orthopedics;  Laterality: Left;    Family Psychiatric History: see below  Family History:  Family History  Problem Relation Age of Onset   Stroke Mother    Heart failure Mother    Hypertension Father    Coronary artery  disease Father     Social History:  Social History   Socioeconomic History   Marital status: Single    Spouse name: Not on file   Number of children: Not on file   Years of education: Not on file   Highest education level: Not on file  Occupational History   Not on file  Tobacco Use   Smoking status: Never   Smokeless tobacco: Never  Substance and Sexual Activity   Alcohol use: No    Comment: 07/10/2016 per pt no   Drug use: No    Comment: 8/3/2017per pt no    Sexual activity: Never  Other Topics Concern   Not on file  Social History Narrative   Not on file   Social Determinants of Health   Financial Resource Strain: Not on file  Food Insecurity: Not on file  Transportation Needs: Not on file  Physical Activity: Not on file  Stress: Not on file  Social Connections: Not on file    Allergies:  Allergies  Allergen Reactions   Morphine And Related Other (See Comments)    Hypotension   Other     Preservative in latanoprost causes redness, pt uses preservative free latanoprost    Paxil [Paroxetine Hcl]     unknown   Latex Rash    Metabolic Disorder Labs: No results found for: HGBA1C, MPG No results found for: PROLACTIN No results found for: CHOL, TRIG, HDL, CHOLHDL, VLDL, LDLCALC No results found for: TSH  Therapeutic Level Labs: No results found for: LITHIUM No results found for: VALPROATE No components found for:  CBMZ  Current Medications: Current Outpatient Medications  Medication Sig Dispense Refill   acetaminophen (TYLENOL 8 HOUR ARTHRITIS PAIN) 650 MG CR tablet Take 650 mg by mouth every 8 (eight) hours as needed for pain.     ALPRAZolam (XANAX) 0.25 MG tablet Take 1 tablet (0.25 mg total) by mouth 3 (three) times daily. 90 tablet 3   alum & mag hydroxide-simeth (MAALOX/MYLANTA) 200-200-20 MG/5ML suspension Take 30 mLs by mouth every 6 (six) hours as needed for indigestion or heartburn.     aspirin 81 MG tablet Take 81 mg by mouth at bedtime.       atorvastatin (LIPITOR) 10 MG tablet Take 10 mg by mouth every evening.      busPIRone (BUSPAR) 15 MG tablet Take 1 tablet (15 mg total) by mouth 2 (two) times daily. 60 tablet 3   calcium carbonate (TUMS - DOSED IN MG ELEMENTAL CALCIUM) 500 MG chewable tablet Chew 1 tablet by mouth daily as needed for indigestion or heartburn.     ibuprofen (ADVIL,MOTRIN) 200 MG tablet Take 200 mg by mouth 2 (two) times daily as needed for moderate pain.      latanoprost (XALATAN) 0.005 % ophthalmic solution Place 1 drop into both eyes at bedtime.     levothyroxine (SYNTHROID, LEVOTHROID) 88 MCG tablet Take  88 mcg by mouth daily before breakfast.      lisinopril (PRINIVIL,ZESTRIL) 10 MG tablet Take 10 mg by mouth every morning.      loratadine (CLARITIN) 10 MG tablet Take 10 mg by mouth daily as needed for allergies.     metFORMIN (GLUCOPHAGE) 500 MG tablet Take by mouth.     metoprolol succinate (TOPROL-XL) 25 MG 24 hr tablet take 1/2 tablet BY MOUTH every morning 3 tablet 0   metoprolol succinate (TOPROL-XL) 25 MG 24 hr tablet take 1/2 tablet BY MOUTH every morning 3 tablet 0   polyethylene glycol (MIRALAX / GLYCOLAX) packet Take 17 g by mouth daily as needed for mild constipation.      venlafaxine XR (EFFEXOR-XR) 150 MG 24 hr capsule TAKE ONE CAPSULE BY MOUTH DAILY with breakfast 30 capsule 3   No current facility-administered medications for this visit.     Musculoskeletal: Strength & Muscle Tone: na Gait & Station: na Patient leans: N/A  Psychiatric Specialty Exam: Review of Systems  Constitutional:  Positive for fatigue.  All other systems reviewed and are negative.  There were no vitals taken for this visit.There is no height or weight on file to calculate BMI.  General Appearance: NA  Eye Contact:  NA  Speech:  Clear and Coherent  Volume:  Normal  Mood:  Euthymic  Affect:  NA  Thought Process:  Goal Directed  Orientation:  Full (Time, Place, and Person)  Thought Content: WDL   Suicidal  Thoughts:  No  Homicidal Thoughts:  No  Memory:  Immediate;   Good Recent;   Good Remote;   Good  Judgement:  Good  Insight:  Good  Psychomotor Activity:  Decreased  Concentration:  Concentration: Good and Attention Span: Good  Recall:  Good  Fund of Knowledge: Good  Language: Good  Akathisia:  No  Handed:  Right  AIMS (if indicated): not done  Assets:  Communication Skills Desire for Improvement Resilience Social Support Talents/Skills  ADL's:  Intact  Cognition: WNL  Sleep:  Good   Screenings: GAD-7    Health and safety inspector from 04/20/2018 in [place] from 09/15/2016 in [place] [facility]  Total GAD-7 Score 14 10      PHQ2-9    [place] from 08/05/2021 in [place] from 04/04/2021 in [place] [facility] from 04/20/2018 in [place] [facility]  PHQ-2 Total Score 0 0 5  PHQ-9 Total Score -- -- 14      Flowsheet Row Telemedicine from 08/05/2021 in [facility] from 04/04/2021 in [place] [facility]  C-SSRS RISK CATEGORY No Risk No Risk        Assessment and Plan: Patient is a 80 year old female with a history of severe depression and anxiety.  She continues to do well on her current regimen.  She will continue Xanax 0.25 mg 3 times daily for anxiety, BuSpar 15 mg twice daily for anxiety and Effexor XR 150 mg daily for depression.  She will return to see me in 4 months   Yesenia Spiller, MD 08/05/2021, 9:14 AM

## 2021-08-07 DIAGNOSIS — E785 Hyperlipidemia, unspecified: Secondary | ICD-10-CM | POA: Diagnosis not present

## 2021-08-07 DIAGNOSIS — N3281 Overactive bladder: Secondary | ICD-10-CM | POA: Diagnosis not present

## 2021-08-07 DIAGNOSIS — E1165 Type 2 diabetes mellitus with hyperglycemia: Secondary | ICD-10-CM | POA: Diagnosis not present

## 2021-08-07 DIAGNOSIS — I10 Essential (primary) hypertension: Secondary | ICD-10-CM | POA: Diagnosis not present

## 2021-09-30 ENCOUNTER — Other Ambulatory Visit (HOSPITAL_COMMUNITY): Payer: Self-pay | Admitting: Psychiatry

## 2021-10-08 DIAGNOSIS — Z299 Encounter for prophylactic measures, unspecified: Secondary | ICD-10-CM | POA: Diagnosis not present

## 2021-10-08 DIAGNOSIS — Z2821 Immunization not carried out because of patient refusal: Secondary | ICD-10-CM | POA: Diagnosis not present

## 2021-10-08 DIAGNOSIS — I10 Essential (primary) hypertension: Secondary | ICD-10-CM | POA: Diagnosis not present

## 2021-10-08 DIAGNOSIS — M25572 Pain in left ankle and joints of left foot: Secondary | ICD-10-CM | POA: Diagnosis not present

## 2021-10-08 DIAGNOSIS — E1165 Type 2 diabetes mellitus with hyperglycemia: Secondary | ICD-10-CM | POA: Diagnosis not present

## 2021-11-05 DIAGNOSIS — R5383 Other fatigue: Secondary | ICD-10-CM | POA: Diagnosis not present

## 2021-11-05 DIAGNOSIS — E785 Hyperlipidemia, unspecified: Secondary | ICD-10-CM | POA: Diagnosis not present

## 2021-11-05 DIAGNOSIS — E1165 Type 2 diabetes mellitus with hyperglycemia: Secondary | ICD-10-CM | POA: Diagnosis not present

## 2021-11-05 DIAGNOSIS — E559 Vitamin D deficiency, unspecified: Secondary | ICD-10-CM | POA: Diagnosis not present

## 2021-11-06 DIAGNOSIS — Z79899 Other long term (current) drug therapy: Secondary | ICD-10-CM | POA: Diagnosis not present

## 2021-11-06 DIAGNOSIS — E039 Hypothyroidism, unspecified: Secondary | ICD-10-CM | POA: Diagnosis not present

## 2021-11-06 DIAGNOSIS — I10 Essential (primary) hypertension: Secondary | ICD-10-CM | POA: Diagnosis not present

## 2021-11-06 DIAGNOSIS — Z789 Other specified health status: Secondary | ICD-10-CM | POA: Diagnosis not present

## 2021-11-06 DIAGNOSIS — Z299 Encounter for prophylactic measures, unspecified: Secondary | ICD-10-CM | POA: Diagnosis not present

## 2021-11-06 DIAGNOSIS — Z0000 Encounter for general adult medical examination without abnormal findings: Secondary | ICD-10-CM | POA: Diagnosis not present

## 2021-11-06 DIAGNOSIS — R5383 Other fatigue: Secondary | ICD-10-CM | POA: Diagnosis not present

## 2021-11-06 DIAGNOSIS — Z7189 Other specified counseling: Secondary | ICD-10-CM | POA: Diagnosis not present

## 2021-11-26 ENCOUNTER — Encounter (HOSPITAL_COMMUNITY): Payer: Self-pay | Admitting: Psychiatry

## 2021-11-26 ENCOUNTER — Other Ambulatory Visit: Payer: Self-pay

## 2021-11-26 ENCOUNTER — Telehealth (INDEPENDENT_AMBULATORY_CARE_PROVIDER_SITE_OTHER): Payer: Medicare Other | Admitting: Psychiatry

## 2021-11-26 DIAGNOSIS — F411 Generalized anxiety disorder: Secondary | ICD-10-CM | POA: Diagnosis not present

## 2021-11-26 MED ORDER — ALPRAZOLAM 0.25 MG PO TABS
0.2500 mg | ORAL_TABLET | Freq: Three times a day (TID) | ORAL | 3 refills | Status: DC
Start: 2021-11-26 — End: 2022-01-29

## 2021-11-26 MED ORDER — VENLAFAXINE HCL ER 150 MG PO CP24: ORAL_CAPSULE | ORAL | 3 refills | Status: DC

## 2021-11-26 MED ORDER — BUSPIRONE HCL 15 MG PO TABS
15.0000 mg | ORAL_TABLET | Freq: Two times a day (BID) | ORAL | 3 refills | Status: DC
Start: 2021-11-26 — End: 2022-03-19

## 2021-11-26 NOTE — Progress Notes (Signed)
Virtual Visit via Telephone Note  I connected with Yesenia Reynolds on 11/26/21 at  9:20 AM EST by telephone and verified that I am speaking with the correct person using two identifiers.  Location: Patient: home Provider: office   I discussed the limitations, risks, security and privacy concerns of performing an evaluation and management service by telephone and the availability of in person appointments. I also discussed with the patient that there may be a patient responsible charge related to this service. The patient expressed understanding and agreed to proceed.       I discussed the assessment and treatment plan with the patient. The patient was provided an opportunity to ask questions and all were answered. The patient agreed with the plan and demonstrated an understanding of the instructions.   The patient was advised to call back or seek an in-person evaluation if the symptoms worsen or if the condition fails to improve as anticipated.  I provided 15 minutes of non-face-to-face time during this encounter.   [name], MD  [facility] MD/PA/NP OP Progress Note  11/26/2021 9:36 AM ADDILYNNE Reynolds  MRN:  [id number]  Chief Complaint:  Chief Complaint   Depression; Anxiety; Follow-up    HPI: This patient is an 80 year old single white female who lives in a local rest home.  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  The patient returns for follow-up after 4 months regarding her depression and anxiety.  Most of the time she is doing well.  She has been sad at times when people in the wrist, passed away or gotten ill but she usually can bounce out of that.  She is still driving on her own and visiting family.  Her energy is still a bit low but she is getting her B12 shots and her recent thyroid studies were normal.  She is sleeping well.  She denies any symptoms of anxiety or severe depression or suicidal ideation.  She does  not report any new health issues.  She feels her current regimen is working well for her depression and anxiety Visit Diagnosis:    ICD-10-CM   1. Generalized anxiety disorder  F41.1 ALPRAZolam (XANAX) 0.25 MG tablet      Past Psychiatric History: Hospitalization about 4 years ago for depression and anxiety which responded to ECT treatment  Past Medical History:  Past Medical History:  Diagnosis Date   Agoraphobia with panic attacks 08/07/2016   Anxiety    Arthritis    Atrial fibrillation, currently in sinus rhythm    Cancer ([place])    Endometrial   Depression    Endometrial cancer, FIGO stage IIIC ([place]) 08/08/2015   Essential hypertension, benign    GERD (gastroesophageal reflux disease)    Hypothyroidism    Mixed hyperlipidemia    Palpitations    Spinal stenosis    Spinal stenosis of lumbar region at multiple levels 08/07/2016   Type 2 diabetes mellitus ([place])     Past Surgical History:  Procedure Laterality Date   ABDOMINAL HYSTERECTOMY     BREAST BIOPSY     benign   CATARACT EXTRACTION W/PHACO Left 03/31/2017   Procedure: CATARACT EXTRACTION PHACO AND INTRAOCULAR LENS PLACEMENT ([place]);  Surgeon: Rutherford Guys, MD;  Location: [facility];  Service: Ophthalmology;  Laterality: Left;  CDE: 8.67   CATARACT EXTRACTION W/PHACO Right 04/14/2017   Procedure: CATARACT EXTRACTION PHACO AND INTRAOCULAR LENS PLACEMENT RIGHT EYE;  Surgeon: Rutherford Guys,  MD;  Location: [facility];  Service: Ophthalmology;  Laterality: Right;  CDE: 8.52   LIPOMA EXCISION     Multiple dental extractions     PORT-A-CATH REMOVAL Right 09/02/2017   Procedure: MINOR REMOVAL PORT-A-CATH;  Surgeon: Aviva Signs, MD;  Location: [facility];  Service: General;  Laterality: Right;   TONSILLECTOMY AND ADENOIDECTOMY     TOTAL KNEE ARTHROPLASTY  08/04/2012   Procedure: TOTAL KNEE ARTHROPLASTY;  Surgeon: Gearlean Alf, MD;  Location: [facility];  Service: Orthopedics;  Laterality: Left;    Family Psychiatric History: See below  Family  History:  Family History  Problem Relation Age of Onset   Stroke Mother    Heart failure Mother    Hypertension Father    Coronary artery disease Father     Social History:  Social History   Socioeconomic History   Marital status: Single    Spouse name: Not on file   Number of children: Not on file   Years of education: Not on file   Highest education level: Not on file  Occupational History   Not on file  Tobacco Use   Smoking status: Never   Smokeless tobacco: Never  Substance and Sexual Activity   Alcohol use: No    Comment: 07/10/2016 per pt no   Drug use: No    Comment: 8/3/2017per pt no    Sexual activity: Never  Other Topics Concern   Not on file  Social History Narrative   Not on file   Social Determinants of Health   Financial Resource Strain: Not on file  Food Insecurity: Not on file  Transportation Needs: Not on file  Physical Activity: Not on file  Stress: Not on file  Social Connections: Not on file    Allergies:  Allergies  Allergen Reactions   Morphine And Related Other (See Comments)    Hypotension   Other     Preservative in latanoprost causes redness, pt uses preservative free latanoprost    Paxil [Paroxetine Hcl]     unknown   Latex Rash    Metabolic Disorder Labs: No results found for: HGBA1C, MPG No results found for: PROLACTIN No results found for: CHOL, TRIG, HDL, CHOLHDL, VLDL, LDLCALC No results found for: TSH  Therapeutic Level Labs: No results found for: LITHIUM No results found for: VALPROATE No components found for:  CBMZ  Current Medications: Current Outpatient Medications  Medication Sig Dispense Refill   acetaminophen (TYLENOL 8 HOUR ARTHRITIS PAIN) 650 MG CR tablet Take 650 mg by mouth every 8 (eight) hours as needed for pain.     ALPRAZolam (XANAX) 0.25 MG tablet Take 1 tablet (0.25 mg total) by mouth 3 (three) times daily. 90 tablet 3   alum & mag hydroxide-simeth (MAALOX/MYLANTA) 200-200-20 MG/5ML suspension  Take 30 mLs by mouth every 6 (six) hours as needed for indigestion or heartburn.     aspirin 81 MG tablet Take 81 mg by mouth at bedtime.      atorvastatin (LIPITOR) 10 MG tablet Take 10 mg by mouth every evening.      busPIRone (BUSPAR) 15 MG tablet Take 1 tablet (15 mg total) by mouth 2 (two) times daily. 60 tablet 3   calcium carbonate (TUMS - DOSED IN MG ELEMENTAL CALCIUM) 500 MG chewable tablet Chew 1 tablet by mouth daily as needed for indigestion or heartburn.     ibuprofen (ADVIL,MOTRIN) 200 MG tablet Take 200 mg by mouth 2 (two) times daily as needed for moderate pain.  latanoprost (XALATAN) 0.005 % ophthalmic solution Place 1 drop into both eyes at bedtime.     levothyroxine (SYNTHROID, LEVOTHROID) 88 MCG tablet Take 88 mcg by mouth daily before breakfast.      lisinopril (PRINIVIL,ZESTRIL) 10 MG tablet Take 10 mg by mouth every morning.      loratadine (CLARITIN) 10 MG tablet Take 10 mg by mouth daily as needed for allergies.     metFORMIN (GLUCOPHAGE) 500 MG tablet Take by mouth.     metoprolol succinate (TOPROL-XL) 25 MG 24 hr tablet take 1/2 tablet BY MOUTH every morning 3 tablet 0   metoprolol succinate (TOPROL-XL) 25 MG 24 hr tablet take 1/2 tablet BY MOUTH every morning 3 tablet 0   polyethylene glycol (MIRALAX / GLYCOLAX) packet Take 17 g by mouth daily as needed for mild constipation.      venlafaxine XR (EFFEXOR-XR) 150 MG 24 hr capsule TAKE ONE CAPSULE BY MOUTH DAILY WITH breakast 30 capsule 3   No current facility-administered medications for this visit.     Musculoskeletal: Strength & Muscle Tone: na Gait & Station: na Patient leans: N/A  Psychiatric Specialty Exam: Review of Systems  All other systems reviewed and are negative.  There were no vitals taken for this visit.There is no height or weight on file to calculate BMI.  General Appearance: NA  Eye Contact:  NA  Speech:  Clear and Coherent  Volume:  Normal  Mood:  Euthymic  Affect:  NA  Thought  Process:  Goal Directed  Orientation:  Full (Time, Place, and Person)  Thought Content: WDL   Suicidal Thoughts:  No  Homicidal Thoughts:  No  Memory:  Immediate;   Good Recent;   Good Remote;   Good  Judgement:  Good  Insight:  Good  Psychomotor Activity:  Decreased  Concentration:  Concentration: Good and Attention Span: Good  Recall:  Good  Fund of Knowledge: Good  Language: Good  Akathisia:  No  Handed:  Right  AIMS (if indicated): not done  Assets:  Communication Skills Desire for Improvement Physical Health Resilience Social Support Talents/Skills  ADL's:  Intact  Cognition: WNL  Sleep:  Good   Screenings: GAD-7    Flowsheet Row Counselor from 04/20/2018 in [place] from 09/15/2016 in [place] [facility]  Total GAD-7 Score 14 10      PHQ2-9    Flowsheet Row Video Visit from 11/26/2021 in [place] from 08/05/2021 in [place] from 04/04/2021 in [place] Counselor from 04/20/2018 in [place] [facility]  PHQ-2 Total Score 1 0 0 5  PHQ-9 Total Score -- -- -- 14      Flowsheet Row Video Visit from 11/26/2021 in [place] from 08/05/2021 in [place] from 04/04/2021 in [place] No Risk No Risk No Risk        Assessment and Plan: This patient is an 80 year old female with a history of severe depression and anxiety.  She continues to do well on her current regimen.  She will continue Xanax 0.25 mg 3 times daily for anxiety, BuSpar 15 mg twice daily for anxiety and Effexor XR 150 mg daily for depression.  She  will return to see me in 4 months   [name], MD 11/26/2021, 9:36 AM

## 2021-11-28 DIAGNOSIS — H401131 Primary open-angle glaucoma, bilateral, mild stage: Secondary | ICD-10-CM | POA: Diagnosis not present

## 2021-12-05 ENCOUNTER — Telehealth (HOSPITAL_COMMUNITY): Payer: Medicare Other | Admitting: Psychiatry

## 2021-12-06 DIAGNOSIS — J449 Chronic obstructive pulmonary disease, unspecified: Secondary | ICD-10-CM | POA: Diagnosis not present

## 2021-12-06 DIAGNOSIS — F32A Depression, unspecified: Secondary | ICD-10-CM | POA: Diagnosis not present

## 2021-12-06 DIAGNOSIS — E785 Hyperlipidemia, unspecified: Secondary | ICD-10-CM | POA: Diagnosis not present

## 2022-01-07 DIAGNOSIS — E785 Hyperlipidemia, unspecified: Secondary | ICD-10-CM | POA: Diagnosis not present

## 2022-01-07 DIAGNOSIS — J449 Chronic obstructive pulmonary disease, unspecified: Secondary | ICD-10-CM | POA: Diagnosis not present

## 2022-01-07 DIAGNOSIS — F32A Depression, unspecified: Secondary | ICD-10-CM | POA: Diagnosis not present

## 2022-01-15 DIAGNOSIS — Z789 Other specified health status: Secondary | ICD-10-CM | POA: Diagnosis not present

## 2022-01-15 DIAGNOSIS — Z23 Encounter for immunization: Secondary | ICD-10-CM | POA: Diagnosis not present

## 2022-01-15 DIAGNOSIS — I10 Essential (primary) hypertension: Secondary | ICD-10-CM | POA: Diagnosis not present

## 2022-01-15 DIAGNOSIS — E039 Hypothyroidism, unspecified: Secondary | ICD-10-CM | POA: Diagnosis not present

## 2022-01-15 DIAGNOSIS — Z299 Encounter for prophylactic measures, unspecified: Secondary | ICD-10-CM | POA: Diagnosis not present

## 2022-01-15 DIAGNOSIS — E1165 Type 2 diabetes mellitus with hyperglycemia: Secondary | ICD-10-CM | POA: Diagnosis not present

## 2022-01-29 ENCOUNTER — Other Ambulatory Visit (HOSPITAL_COMMUNITY): Payer: Self-pay | Admitting: Psychiatry

## 2022-01-29 DIAGNOSIS — F411 Generalized anxiety disorder: Secondary | ICD-10-CM

## 2022-02-05 DIAGNOSIS — Z713 Dietary counseling and surveillance: Secondary | ICD-10-CM | POA: Diagnosis not present

## 2022-02-05 DIAGNOSIS — I10 Essential (primary) hypertension: Secondary | ICD-10-CM | POA: Diagnosis not present

## 2022-02-05 DIAGNOSIS — R350 Frequency of micturition: Secondary | ICD-10-CM | POA: Diagnosis not present

## 2022-02-05 DIAGNOSIS — Z299 Encounter for prophylactic measures, unspecified: Secondary | ICD-10-CM | POA: Diagnosis not present

## 2022-02-06 DIAGNOSIS — R350 Frequency of micturition: Secondary | ICD-10-CM | POA: Diagnosis not present

## 2022-02-07 DIAGNOSIS — E1165 Type 2 diabetes mellitus with hyperglycemia: Secondary | ICD-10-CM | POA: Diagnosis not present

## 2022-02-07 DIAGNOSIS — Z299 Encounter for prophylactic measures, unspecified: Secondary | ICD-10-CM | POA: Diagnosis not present

## 2022-02-07 DIAGNOSIS — I10 Essential (primary) hypertension: Secondary | ICD-10-CM | POA: Diagnosis not present

## 2022-02-07 DIAGNOSIS — R338 Other retention of urine: Secondary | ICD-10-CM | POA: Diagnosis not present

## 2022-02-19 DIAGNOSIS — Z299 Encounter for prophylactic measures, unspecified: Secondary | ICD-10-CM | POA: Diagnosis not present

## 2022-02-19 DIAGNOSIS — Z789 Other specified health status: Secondary | ICD-10-CM | POA: Diagnosis not present

## 2022-02-19 DIAGNOSIS — N390 Urinary tract infection, site not specified: Secondary | ICD-10-CM | POA: Diagnosis not present

## 2022-02-19 DIAGNOSIS — R350 Frequency of micturition: Secondary | ICD-10-CM | POA: Diagnosis not present

## 2022-02-19 DIAGNOSIS — I10 Essential (primary) hypertension: Secondary | ICD-10-CM | POA: Diagnosis not present

## 2022-03-19 ENCOUNTER — Telehealth (INDEPENDENT_AMBULATORY_CARE_PROVIDER_SITE_OTHER): Payer: Medicare Other | Admitting: Psychiatry

## 2022-03-19 ENCOUNTER — Encounter (HOSPITAL_COMMUNITY): Payer: Self-pay | Admitting: Psychiatry

## 2022-03-19 DIAGNOSIS — F411 Generalized anxiety disorder: Secondary | ICD-10-CM

## 2022-03-19 MED ORDER — ALPRAZOLAM 0.25 MG PO TABS: 0.2500 mg | ORAL_TABLET | Freq: Three times a day (TID) | ORAL | 3 refills | Status: DC

## 2022-03-19 MED ORDER — BUSPIRONE HCL 15 MG PO TABS: 15.0000 mg | ORAL_TABLET | Freq: Two times a day (BID) | ORAL | 3 refills | Status: DC

## 2022-03-19 MED ORDER — VENLAFAXINE HCL ER 150 MG PO CP24: ORAL_CAPSULE | ORAL | 3 refills | Status: DC

## 2022-03-19 NOTE — Progress Notes (Signed)
Virtual Visit via Telephone Note ? ?I connected with Yesenia Reynolds on 03/19/22 at 11:20 AM EDT by telephone and verified that I am speaking with the correct person using two identifiers. ? ?Location: ?Patient: home ?Provider: office ?  ?I discussed the limitations, risks, security and privacy concerns of performing an evaluation and management service by telephone and the availability of in person appointments. I also discussed with the patient that there may be a patient responsible charge related to this service. The patient expressed understanding and agreed to proceed. ? ? ? ? ?  ?I discussed the assessment and treatment plan with the patient. The patient was provided an opportunity to ask questions and all were answered. The patient agreed with the plan and demonstrated an understanding of the instructions. ?  ?The patient was advised to call back or seek an in-person evaluation if the symptoms worsen or if the condition fails to improve as anticipated. ? ?I provided  minutes of non-face-to-face time during this encounter. ? ? ?Yesenia Spiller, MD ? ?[facility] MD/PA/NP OP Progress Note ? ?03/19/2022 11:34 AM ?Yesenia Reynolds  ?MRN:  [id number] ? ?Chief Complaint:  ?Chief Complaint  ?Patient presents with  ? Anxiety  ? Depression  ? Follow-up  ? ?HPI: This patient is an 81 year old single white female who lives in a local rest home.  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 ? ?The patient returns for follow-up after 4 months regarding her depression and anxiety.  Most of the time she continues to do well.  She is missing some of the people she knows who have died recently.  She is also struggled with diarrhea which was secondary to metformin and is slowly getting better as well as urinary incontinence.  This is kept her from going out as much but she still having friends coming in to visit.  She is sleeping and eating well and her mood is good.  She sounds very  hopeful and upbeat. ?Visit Diagnosis:  ?  ICD-10-CM   ?1. Generalized anxiety disorder  F41.1 ALPRAZolam (XANAX) 0.25 MG tablet  ?  ? ? ?Past Psychiatric History: Hospitalization about 4 years ago for depression and anxiety which responded to ECT treatment ? ?Past Medical History:  ?Past Medical History:  ?Diagnosis Date  ? Agoraphobia with panic attacks 08/07/2016  ? Anxiety   ? Arthritis   ? Atrial fibrillation, currently in sinus rhythm   ? Cancer [facility])   ? Endometrial  ? Depression   ? Endometrial cancer, FIGO stage IIIC ([place]) 08/08/2015  ? Essential hypertension, benign   ? GERD (gastroesophageal reflux disease)   ? Hypothyroidism   ? Mixed hyperlipidemia   ? Palpitations   ? Spinal stenosis   ? Spinal stenosis of lumbar region at multiple levels 08/07/2016  ? Type 2 diabetes mellitus ([place])   ?  ?Past Surgical History:  ?Procedure Laterality Date  ? ABDOMINAL HYSTERECTOMY    ? BREAST BIOPSY    ? benign  ? CATARACT EXTRACTION W/PHACO Left 03/31/2017  ? Procedure: CATARACT EXTRACTION PHACO AND INTRAOCULAR LENS PLACEMENT ([facility]);  Surgeon: Rutherford Guys, MD;  Location: [facility];  Service: Ophthalmology;  Laterality: Left;  CDE: 8.67  ? CATARACT EXTRACTION W/PHACO Right 04/14/2017  ? Procedure: CATARACT EXTRACTION PHACO AND INTRAOCULAR LENS PLACEMENT RIGHT EYE;  Surgeon: Rutherford Guys, MD;  Location: [facility];  Service: Ophthalmology;  Laterality: Right;  CDE: 8.52  ? LIPOMA EXCISION    ?  Multiple dental extractions    ? PORT-A-CATH REMOVAL Right 09/02/2017  ? Procedure: MINOR REMOVAL PORT-A-CATH;  Surgeon: Aviva Signs, MD;  Location: [facility];  Service: General;  Laterality: Right;  ? TONSILLECTOMY AND ADENOIDECTOMY    ? TOTAL KNEE ARTHROPLASTY  08/04/2012  ? Procedure: TOTAL KNEE ARTHROPLASTY;  Surgeon: Gearlean Alf, MD;  [facility]: [facility];  Service: Orthopedics;  Laterality: Left;  ? ? ?Family Psychiatric History: see below ? ?Family History:  ?Family History  ?Problem Relation Age of Onset  ? Stroke Mother   ? Heart failure  Mother   ? Hypertension Father   ? Coronary artery disease Father   ? ? ?Social History:  ?Social History  ? ?Socioeconomic History  ? Marital status: Single  ?  Spouse name: Not on file  ? Number of children: Not on file  ? Years of education: Not on file  ? Highest education level: Not on file  ?Occupational History  ? Not on file  ?Tobacco Use  ? Smoking status: Never  ? Smokeless tobacco: Never  ?Substance and Sexual Activity  ? Alcohol use: No  ?  Comment: 07/10/2016 per pt no  ? Drug use: No  ?  Comment: 8/3/2017per pt no   ? Sexual activity: Never  ?Other Topics Concern  ? Not on file  ?Social History Narrative  ? Not on file  ? ?Social Determinants of Health  ? ?Financial Resource Strain: Not on file  ?Food Insecurity: Not on file  ?Transportation Needs: Not on file  ?Physical Activity: Not on file  ?Stress: Not on file  ?Social Connections: Not on file  ? ? ?Allergies:  ?Allergies  ?Allergen Reactions  ? Morphine And Related Other (See Comments)  ?  Hypotension  ? Other   ?  Preservative in latanoprost causes redness, pt uses preservative free latanoprost   ? Paxil [Paroxetine Hcl]   ?  unknown  ? Latex Rash  ? ? ?Metabolic Disorder Labs: ?No results found for: HGBA1C, MPG ?No results found for: PROLACTIN ?No results found for: CHOL, TRIG, HDL, CHOLHDL, VLDL, LDLCALC ?No results found for: TSH ? ?Therapeutic Level Labs: ?No results found for: LITHIUM ?No results found for: VALPROATE ?No components found for:  CBMZ ? ?Current Medications: ?Current Outpatient Medications  ?Medication Sig Dispense Refill  ? acetaminophen (TYLENOL 8 HOUR ARTHRITIS PAIN) 650 MG CR tablet Take 650 mg by mouth every 8 (eight) hours as needed for pain.    ? ALPRAZolam (XANAX) 0.25 MG tablet Take 1 tablet (0.25 mg total) by mouth 3 (three) times daily. 90 tablet 3  ? alum & mag hydroxide-simeth (MAALOX/MYLANTA) 202-542-70 MG/5ML suspension Take 30 mLs by mouth every 6 (six) hours as needed for indigestion or heartburn.    ? aspirin 81  MG tablet Take 81 mg by mouth at bedtime.     ? atorvastatin (LIPITOR) 10 MG tablet Take 10 mg by mouth every evening.     ? busPIRone (BUSPAR) 15 MG tablet Take 1 tablet (15 mg total) by mouth 2 (two) times daily. 60 tablet 3  ? calcium carbonate (TUMS - DOSED IN MG ELEMENTAL CALCIUM) 500 MG chewable tablet Chew 1 tablet by mouth daily as needed for indigestion or heartburn.    ? ibuprofen (ADVIL,MOTRIN) 200 MG tablet Take 200 mg by mouth 2 (two) times daily as needed for moderate pain.     ? latanoprost (XALATAN) 0.005 % ophthalmic solution Place 1 drop into both eyes at bedtime.    ?  levothyroxine (SYNTHROID, LEVOTHROID) 88 MCG tablet Take 88 mcg by mouth daily before breakfast.     ? lisinopril (PRINIVIL,ZESTRIL) 10 MG tablet Take 10 mg by mouth every morning.     ? loratadine (CLARITIN) 10 MG tablet Take 10 mg by mouth daily as needed for allergies.    ? metFORMIN (GLUCOPHAGE) 500 MG tablet Take by mouth.    ? metoprolol succinate (TOPROL-XL) 25 MG 24 hr tablet take 1/2 tablet BY MOUTH every morning 3 tablet 0  ? metoprolol succinate (TOPROL-XL) 25 MG 24 hr tablet take 1/2 tablet BY MOUTH every morning 3 tablet 0  ? polyethylene glycol (MIRALAX / GLYCOLAX) packet Take 17 g by mouth daily as needed for mild constipation.     ? venlafaxine XR (EFFEXOR-XR) 150 MG 24 hr capsule TAKE ONE CAPSULE BY MOUTH DAILY WITH breakast 30 capsule 3  ? ?No current facility-administered medications for this visit.  ? ? ? ?Musculoskeletal: ?Strength & Muscle Tone: na ?Gait & Station: na ?Patient leans: N/A ? ?Psychiatric Specialty Exam: ?Review of Systems  ?Gastrointestinal:  Positive for diarrhea.  ?Genitourinary:  Positive for enuresis.   ?There were no vitals taken for this visit.There is no height or weight on file to calculate BMI.  ?General Appearance: NA  ?Eye Contact:  NA  ?Speech:  Clear and Coherent  ?Volume:  Normal  ?Mood:  Euthymic  ?Affect:  NA  ?Thought Process:  Goal Directed  ?Orientation:  Full (Time, Place, and  Person)  ?Thought Content: WDL   ?Suicidal Thoughts:  No  ?Homicidal Thoughts:  No  ?Memory:  Immediate;   Good ?Recent;   Good ?Remote;   Good  ?Judgement:  Good  ?Insight:  Good  ?Psychomotor Activ

## 2022-03-25 NOTE — Progress Notes (Signed)
Virtual Visit via Telephone Note ? ?I connected with Yesenia Reynolds on 03/25/22 at 11:20 AM EDT by telephone and verified that I am speaking with the correct person using two identifiers. ? ?Location: ?Patient: home ?Provider: office ?  ?I discussed the limitations, risks, security and privacy concerns of performing an evaluation and management service by telephone and the availability of in person appointments. I also discussed with the patient that there may be a patient responsible charge related to this service. The patient expressed understanding and agreed to proceed. ? ? ? ? ?  ?I discussed the assessment and treatment plan with the patient. The patient was provided an opportunity to ask questions and all were answered. The patient agreed with the plan and demonstrated an understanding of the instructions. ?  ?The patient was advised to call back or seek an in-person evaluation if the symptoms worsen or if the condition fails to improve as anticipated. ? ?I provided  15 minutes of non-face-to-face time during this encounter. ? ? ?[name], MD ? ?BH MD/PA/NP OP Progress Note ? ?03/25/2022 9:54 AM ?Yesenia Reynolds  ?MRN:  [id number] ? ?Chief Complaint:  ?Chief Complaint  ?Patient presents with  ? Anxiety  ? Depression  ? Follow-up  ? ?HPI: This patient is an 81 year old single white female who lives in a local rest home.  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 ? ?The patient returns for follow-up after 4 months regarding her depression and anxiety.  Most of the time she continues to do well.  She is missing some of the people she knows who have died recently.  She is also struggled with diarrhea which was secondary to metformin and is slowly getting better as well as urinary incontinence.  This is kept her from going out as much but she still having friends coming in to visit.  She is sleeping and eating well and her mood is good.  She sounds very  hopeful and upbeat. ?Visit Diagnosis:  ?  ICD-10-CM   ?1. Generalized anxiety disorder  F41.1 ALPRAZolam (XANAX) 0.25 MG tablet  ?  ? ? ?Past Psychiatric History: Hospitalization about 4 years ago for depression and anxiety which responded to ECT treatment ? ?Past Medical History:  ?Past Medical History:  ?Diagnosis Date  ? Agoraphobia with panic attacks 08/07/2016  ? Anxiety   ? Arthritis   ? Atrial fibrillation, currently in sinus rhythm   ? Cancer [facility])   ? Endometrial  ? Depression   ? Endometrial cancer, FIGO stage IIIC ([place]) 08/08/2015  ? Essential hypertension, benign   ? GERD (gastroesophageal reflux disease)   ? Hypothyroidism   ? Mixed hyperlipidemia   ? Palpitations   ? Spinal stenosis   ? Spinal stenosis of lumbar region at multiple levels 08/07/2016  ? Type 2 diabetes mellitus ([place])   ?  ?Past Surgical History:  ?Procedure Laterality Date  ? ABDOMINAL HYSTERECTOMY    ? BREAST BIOPSY    ? benign  ? CATARACT EXTRACTION W/PHACO Left 03/31/2017  ? Procedure: CATARACT EXTRACTION PHACO AND INTRAOCULAR LENS PLACEMENT (IOC);  Surgeon: Rutherford Guys, MD;  [facility]: [facility];  Service: Ophthalmology;  Laterality: Left;  CDE: 8.67  ? CATARACT EXTRACTION W/PHACO Right 04/14/2017  ? Procedure: CATARACT EXTRACTION PHACO AND INTRAOCULAR LENS PLACEMENT RIGHT EYE;  Surgeon: Rutherford Guys, MD;  Location: [facility];  Service: Ophthalmology;  Laterality: Right;  CDE: 8.52  ? LIPOMA  EXCISION    ? Multiple dental extractions    ? PORT-A-CATH REMOVAL Right 09/02/2017  ? Procedure: MINOR REMOVAL PORT-A-CATH;  Surgeon: Aviva Signs, MD;  Location: [facility];  Service: General;  Laterality: Right;  ? TONSILLECTOMY AND ADENOIDECTOMY    ? TOTAL KNEE ARTHROPLASTY  08/04/2012  ? Procedure: TOTAL KNEE ARTHROPLASTY;  Surgeon: Gearlean Alf, MD;  [facility]: [facility];  Service: Orthopedics;  Laterality: Left;  ? ? ?Family Psychiatric History: see below ? ?Family History:  ?Family History  ?Problem Relation Age of Onset  ? Stroke Mother   ? Heart failure  Mother   ? Hypertension Father   ? Coronary artery disease Father   ? ? ?Social History:  ?Social History  ? ?Socioeconomic History  ? Marital status: Single  ?  Spouse name: Not on file  ? Number of children: Not on file  ? Years of education: Not on file  ? Highest education level: Not on file  ?Occupational History  ? Not on file  ?Tobacco Use  ? Smoking status: Never  ? Smokeless tobacco: Never  ?Substance and Sexual Activity  ? Alcohol use: No  ?  Comment: 07/10/2016 per pt no  ? Drug use: No  ?  Comment: 8/3/2017per pt no   ? Sexual activity: Never  ?Other Topics Concern  ? Not on file  ?Social History Narrative  ? Not on file  ? ?Social Determinants of Health  ? ?Financial Resource Strain: Not on file  ?Food Insecurity: Not on file  ?Transportation Needs: Not on file  ?Physical Activity: Not on file  ?Stress: Not on file  ?Social Connections: Not on file  ? ? ?Allergies:  ?Allergies  ?Allergen Reactions  ? Morphine And Related Other (See Comments)  ?  Hypotension  ? Other   ?  Preservative in latanoprost causes redness, pt uses preservative free latanoprost   ? Paxil [Paroxetine Hcl]   ?  unknown  ? Latex Rash  ? ? ?Metabolic Disorder Labs: ?No results found for: HGBA1C, MPG ?No results found for: PROLACTIN ?No results found for: CHOL, TRIG, HDL, CHOLHDL, VLDL, LDLCALC ?No results found for: TSH ? ?Therapeutic Level Labs: ?No results found for: LITHIUM ?No results found for: VALPROATE ?No components found for:  CBMZ ? ?Current Medications: ?Current Outpatient Medications  ?Medication Sig Dispense Refill  ? acetaminophen (TYLENOL 8 HOUR ARTHRITIS PAIN) 650 MG CR tablet Take 650 mg by mouth every 8 (eight) hours as needed for pain.    ? ALPRAZolam (XANAX) 0.25 MG tablet Take 1 tablet (0.25 mg total) by mouth 3 (three) times daily. 90 tablet 3  ? alum & mag hydroxide-simeth (MAALOX/MYLANTA) 350-093-81 MG/5ML suspension Take 30 mLs by mouth every 6 (six) hours as needed for indigestion or heartburn.    ? aspirin 81  MG tablet Take 81 mg by mouth at bedtime.     ? atorvastatin (LIPITOR) 10 MG tablet Take 10 mg by mouth every evening.     ? busPIRone (BUSPAR) 15 MG tablet Take 1 tablet (15 mg total) by mouth 2 (two) times daily. 60 tablet 3  ? calcium carbonate (TUMS - DOSED IN MG ELEMENTAL CALCIUM) 500 MG chewable tablet Chew 1 tablet by mouth daily as needed for indigestion or heartburn.    ? ibuprofen (ADVIL,MOTRIN) 200 MG tablet Take 200 mg by mouth 2 (two) times daily as needed for moderate pain.     ? latanoprost (XALATAN) 0.005 % ophthalmic solution Place 1 drop into both eyes at  bedtime.    ? levothyroxine (SYNTHROID, LEVOTHROID) 88 MCG tablet Take 88 mcg by mouth daily before breakfast.     ? lisinopril (PRINIVIL,ZESTRIL) 10 MG tablet Take 10 mg by mouth every morning.     ? loratadine (CLARITIN) 10 MG tablet Take 10 mg by mouth daily as needed for allergies.    ? metFORMIN (GLUCOPHAGE) 500 MG tablet Take by mouth.    ? metoprolol succinate (TOPROL-XL) 25 MG 24 hr tablet take 1/2 tablet BY MOUTH every morning 3 tablet 0  ? metoprolol succinate (TOPROL-XL) 25 MG 24 hr tablet take 1/2 tablet BY MOUTH every morning 3 tablet 0  ? polyethylene glycol (MIRALAX / GLYCOLAX) packet Take 17 g by mouth daily as needed for mild constipation.     ? venlafaxine XR (EFFEXOR-XR) 150 MG 24 hr capsule TAKE ONE CAPSULE BY MOUTH DAILY WITH breakast 30 capsule 3  ? ?No current facility-administered medications for this visit.  ? ? ? ?Musculoskeletal: ?Strength & Muscle Tone: na ?Gait & Station: na ?Patient leans: N/A ? ?Psychiatric Specialty Exam: ?Review of Systems  ?Gastrointestinal:  Positive for diarrhea.  ?Genitourinary:  Positive for enuresis.   ?There were no vitals taken for this visit.There is no height or weight on file to calculate BMI.  ?General Appearance: NA  ?Eye Contact:  NA  ?Speech:  Clear and Coherent  ?Volume:  Normal  ?Mood:  Euthymic  ?Affect:  NA  ?Thought Process:  Goal Directed  ?Orientation:  Full (Time, Place, and  Person)  ?Thought Content: WDL   ?Suicidal Thoughts:  No  ?Homicidal Thoughts:  No  ?Memory:  Immediate;   Good ?Recent;   Good ?Remote;   Good  ?Judgement:  Good  ?Insight:  Good  ?Psychomotor Act

## 2022-04-02 DIAGNOSIS — H401131 Primary open-angle glaucoma, bilateral, mild stage: Secondary | ICD-10-CM | POA: Diagnosis not present

## 2022-04-02 DIAGNOSIS — E119 Type 2 diabetes mellitus without complications: Secondary | ICD-10-CM | POA: Diagnosis not present

## 2022-04-02 DIAGNOSIS — H26491 Other secondary cataract, right eye: Secondary | ICD-10-CM | POA: Diagnosis not present

## 2022-04-02 DIAGNOSIS — Z961 Presence of intraocular lens: Secondary | ICD-10-CM | POA: Diagnosis not present

## 2022-04-23 DIAGNOSIS — Z789 Other specified health status: Secondary | ICD-10-CM | POA: Diagnosis not present

## 2022-04-23 DIAGNOSIS — I10 Essential (primary) hypertension: Secondary | ICD-10-CM | POA: Diagnosis not present

## 2022-04-23 DIAGNOSIS — E1165 Type 2 diabetes mellitus with hyperglycemia: Secondary | ICD-10-CM | POA: Diagnosis not present

## 2022-04-23 DIAGNOSIS — Z299 Encounter for prophylactic measures, unspecified: Secondary | ICD-10-CM | POA: Diagnosis not present

## 2022-05-28 DIAGNOSIS — H26491 Other secondary cataract, right eye: Secondary | ICD-10-CM | POA: Diagnosis not present

## 2022-07-17 DIAGNOSIS — R5383 Other fatigue: Secondary | ICD-10-CM | POA: Diagnosis not present

## 2022-07-17 DIAGNOSIS — Z0000 Encounter for general adult medical examination without abnormal findings: Secondary | ICD-10-CM | POA: Diagnosis not present

## 2022-07-17 DIAGNOSIS — Z299 Encounter for prophylactic measures, unspecified: Secondary | ICD-10-CM | POA: Diagnosis not present

## 2022-07-17 DIAGNOSIS — Z789 Other specified health status: Secondary | ICD-10-CM | POA: Diagnosis not present

## 2022-07-17 DIAGNOSIS — I10 Essential (primary) hypertension: Secondary | ICD-10-CM | POA: Diagnosis not present

## 2022-07-17 DIAGNOSIS — E7800 Pure hypercholesterolemia, unspecified: Secondary | ICD-10-CM | POA: Diagnosis not present

## 2022-07-17 DIAGNOSIS — Z79899 Other long term (current) drug therapy: Secondary | ICD-10-CM | POA: Diagnosis not present

## 2022-07-17 DIAGNOSIS — Z7189 Other specified counseling: Secondary | ICD-10-CM | POA: Diagnosis not present

## 2022-07-18 DIAGNOSIS — E7800 Pure hypercholesterolemia, unspecified: Secondary | ICD-10-CM | POA: Diagnosis not present

## 2022-07-18 DIAGNOSIS — Z79899 Other long term (current) drug therapy: Secondary | ICD-10-CM | POA: Diagnosis not present

## 2022-07-18 DIAGNOSIS — R5383 Other fatigue: Secondary | ICD-10-CM | POA: Diagnosis not present

## 2022-07-18 DIAGNOSIS — E559 Vitamin D deficiency, unspecified: Secondary | ICD-10-CM | POA: Diagnosis not present

## 2022-07-21 ENCOUNTER — Telehealth (INDEPENDENT_AMBULATORY_CARE_PROVIDER_SITE_OTHER): Payer: Medicare Other | Admitting: Psychiatry

## 2022-07-21 ENCOUNTER — Encounter (HOSPITAL_COMMUNITY): Payer: Self-pay | Admitting: Psychiatry

## 2022-07-21 DIAGNOSIS — F411 Generalized anxiety disorder: Secondary | ICD-10-CM | POA: Diagnosis not present

## 2022-07-21 MED ORDER — ALPRAZOLAM 0.25 MG PO TABS: 0.2500 mg | ORAL_TABLET | Freq: Three times a day (TID) | ORAL | 3 refills | Status: DC

## 2022-07-21 MED ORDER — BUSPIRONE HCL 15 MG PO TABS: 15.0000 mg | ORAL_TABLET | Freq: Two times a day (BID) | ORAL | 3 refills | Status: DC

## 2022-07-21 MED ORDER — VENLAFAXINE HCL ER 150 MG PO CP24: ORAL_CAPSULE | ORAL | 3 refills | Status: DC

## 2022-07-21 NOTE — Progress Notes (Signed)
Virtual Visit via Telephone Note  I connected with Yesenia Reynolds on 07/21/22 at 11:40 AM EDT by telephone and verified that I am speaking with the correct person using two identifiers.  Location: Patient:home Provider: home office   I discussed the limitations, risks, security and privacy concerns of performing an evaluation and management service by telephone and the availability of in person appointments. I also discussed with the patient that there may be a patient responsible charge related to this service. The patient expressed understanding and agreed to proceed.      I discussed the assessment and treatment plan with the patient. The patient was provided an opportunity to ask questions and all were answered. The patient agreed with the plan and demonstrated an understanding of the instructions.   The patient was advised to call back or seek an in-person evaluation if the symptoms worsen or if the condition fails to improve as anticipated.  I provided 15 minutes of non-face-to-face time during this encounter.   Yesenia Spiller, MD  [facility] MD/PA/NP OP Progress Note  07/21/2022 11:39 AM Yesenia Reynolds  MRN:  [id number]  Chief Complaint:  Chief Complaint  Patient presents with   Anxiety   Depression   Follow-up   HPI: This patient is an 81 year old single white female who lives in a local rest home.  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  She returns for follow-up after 4 months regarding her depression and anxiety.  She continues to do very well.  She gets out a little bit every day.  She is still talking to friends on the phone and making connections with people.  She denies significant depression anxiety or difficulty sleeping.  Her health has been stable. Visit Diagnosis:    ICD-10-CM   1. Generalized anxiety disorder  F41.1 ALPRAZolam (XANAX) 0.25 MG tablet      Past Psychiatric History: Hospitalized about 4  years ago for depression and anxiety which responded to ECT  Past Medical History:  Past Medical History:  Diagnosis Date   Agoraphobia with panic attacks 08/07/2016   Anxiety    Arthritis    Atrial fibrillation, currently in sinus rhythm    Cancer ([place])    Endometrial   Depression    Endometrial cancer, FIGO stage IIIC ([place]) 08/08/2015   Essential hypertension, benign    GERD (gastroesophageal reflux disease)    Hypothyroidism    Mixed hyperlipidemia    Palpitations    Spinal stenosis    Spinal stenosis of lumbar region at multiple levels 08/07/2016   Type 2 diabetes mellitus ([place])     Past Surgical History:  Procedure Laterality Date   ABDOMINAL HYSTERECTOMY     BREAST BIOPSY     benign   CATARACT EXTRACTION W/PHACO Left 03/31/2017   Procedure: CATARACT EXTRACTION PHACO AND INTRAOCULAR LENS PLACEMENT ([place]);  Surgeon: Rutherford Guys, MD;  [facility]: [facility];  Service: Ophthalmology;  Laterality: Left;  CDE: 8.67   CATARACT EXTRACTION W/PHACO Right 04/14/2017   Procedure: CATARACT EXTRACTION PHACO AND INTRAOCULAR LENS PLACEMENT RIGHT EYE;  Surgeon: Rutherford Guys, MD;  Location: [facility];  Service: Ophthalmology;  Laterality: Right;  CDE: 8.52   LIPOMA EXCISION     Multiple dental extractions     PORT-A-CATH REMOVAL Right 09/02/2017   Procedure: MINOR REMOVAL PORT-A-CATH;  Surgeon: Aviva Signs, MD;  Location: [facility];  Service: General;  Laterality: Right;   TONSILLECTOMY AND ADENOIDECTOMY  TOTAL KNEE ARTHROPLASTY  08/04/2012   Procedure: TOTAL KNEE ARTHROPLASTY;  Surgeon: Gearlean Alf, MD;  [facility];  Service: Orthopedics;  Laterality: Left;    Family Psychiatric History: see below  Family History:  Family History  Problem Relation Age of Onset   Stroke Mother    Heart failure Mother    Hypertension Father    Coronary artery disease Father     Social History:  Social History   Socioeconomic History   Marital status: Single    Spouse name: Not on file   Number  of children: Not on file   Years of education: Not on file   Highest education level: Not on file  Occupational History   Not on file  Tobacco Use   Smoking status: Never   Smokeless tobacco: Never  Substance and Sexual Activity   Alcohol use: No    Comment: 07/10/2016 per pt no   Drug use: No    Comment: 8/3/2017per pt no    Sexual activity: Never  Other Topics Concern   Not on file  Social History Narrative   Not on file   Social Determinants of Health   Financial Resource Strain: Not on file  Food Insecurity: Not on file  Transportation Needs: Not on file  Physical Activity: Not on file  Stress: Not on file  Social Connections: Not on file    Allergies:  Allergies  Allergen Reactions   Morphine And Related Other (See Comments)    Hypotension   Other     Preservative in latanoprost causes redness, pt uses preservative free latanoprost    Paxil [Paroxetine Hcl]     unknown   Latex Rash    Metabolic Disorder Labs: No results found for: "HGBA1C", "MPG" No results found for: "PROLACTIN" No results found for: "CHOL", "TRIG", "HDL", "CHOLHDL", "VLDL", "LDLCALC" No results found for: "TSH"  Therapeutic Level Labs: No results found for: "LITHIUM" No results found for: "VALPROATE" No results found for: "CBMZ"  Current Medications: Current Outpatient Medications  Medication Sig Dispense Refill   acetaminophen (TYLENOL 8 HOUR ARTHRITIS PAIN) 650 MG CR tablet Take 650 mg by mouth every 8 (eight) hours as needed for pain.     ALPRAZolam (XANAX) 0.25 MG tablet Take 1 tablet (0.25 mg total) by mouth 3 (three) times daily. 90 tablet 3   alum & mag hydroxide-simeth (MAALOX/MYLANTA) 200-200-20 MG/5ML suspension Take 30 mLs by mouth every 6 (six) hours as needed for indigestion or heartburn.     aspirin 81 MG tablet Take 81 mg by mouth at bedtime.      atorvastatin (LIPITOR) 10 MG tablet Take 10 mg by mouth every evening.      busPIRone (BUSPAR) 15 MG tablet Take 1 tablet (15  mg total) by mouth 2 (two) times daily. 60 tablet 3   calcium carbonate (TUMS - DOSED IN MG ELEMENTAL CALCIUM) 500 MG chewable tablet Chew 1 tablet by mouth daily as needed for indigestion or heartburn.     ibuprofen (ADVIL,MOTRIN) 200 MG tablet Take 200 mg by mouth 2 (two) times daily as needed for moderate pain.      latanoprost (XALATAN) 0.005 % ophthalmic solution Place 1 drop into both eyes at bedtime.     levothyroxine (SYNTHROID, LEVOTHROID) 88 MCG tablet Take 88 mcg by mouth daily before breakfast.      lisinopril (PRINIVIL,ZESTRIL) 10 MG tablet Take 10 mg by mouth every morning.      loratadine (CLARITIN) 10 MG tablet Take  10 mg by mouth daily as needed for allergies.     metFORMIN (GLUCOPHAGE) 500 MG tablet Take by mouth.     metoprolol succinate (TOPROL-XL) 25 MG 24 hr tablet take 1/2 tablet BY MOUTH every morning 3 tablet 0   metoprolol succinate (TOPROL-XL) 25 MG 24 hr tablet take 1/2 tablet BY MOUTH every morning 3 tablet 0   polyethylene glycol (MIRALAX / GLYCOLAX) packet Take 17 g by mouth daily as needed for mild constipation.      venlafaxine XR (EFFEXOR-XR) 150 MG 24 hr capsule TAKE ONE CAPSULE BY MOUTH DAILY WITH breakast 30 capsule 3   No current facility-administered medications for this visit.     Musculoskeletal: Strength & Muscle Tone: na Gait & Station: na Patient leans: N/A  Psychiatric Specialty Exam: Review of Systems  All other systems reviewed and are negative.   There were no vitals taken for this visit.There is no height or weight on file to calculate BMI.  General Appearance: NA  Eye Contact:  NA  Speech:  Clear and Coherent  Volume:  Normal  Mood:  Euthymic  Affect:  NA  Thought Process:  Goal Directed  Orientation:  Full (Time, Place, and Person)  Thought Content: WDL   Suicidal Thoughts:  No  Homicidal Thoughts:  No  Memory:  Immediate;   Good Recent;   Good Remote;   Good  Judgement:  Good  Insight:  Good  Psychomotor Activity:  Normal   Concentration:  Concentration: Good and Attention Span: Good  Recall:  Good  Fund of Knowledge: Good  Language: Good  Akathisia:  No  Handed:  Right  AIMS (if indicated): not done  Assets:  Communication Skills Desire for Improvement Resilience Social Support  ADL's:  Intact  Cognition: WNL  Sleep:  Good   Screenings: GAD-7    Flowsheet Row Counselor from 04/20/2018 in [place] from 09/15/2016 in [facility]  Total GAD-7 Score 14 10      PHQ2-9    Flowsheet Row Video Visit from 07/21/2022 in [facility] Video Visit from 03/19/2022 in [facility] Video Visit from 11/26/2021 in [place] from 08/05/2021 in [facility] from 04/04/2021 in [facility]  PHQ-2 Total Score 0 0 1 0 0      Flowsheet Row Video Visit from 07/21/2022 in [facility] Video Visit from 03/19/2022 in [facility] Video Visit from 11/26/2021 in [place] No Risk No Risk No Risk        Assessment and Plan: This patient is an 81 year old female with a history of severe depression and anxiety.  However she continues to do very well on her current regimen.  She will continue Xanax 0.25 mg 3 times daily for anxiety, BuSpar 15 mg twice daily for anxiety and Effexor XR 150 mg daily for depression.  She will return to see me in 4 months  Collaboration of Care: Collaboration of Care: Primary Care Provider AEB notes will be shared with PCP at patient's request  Patient/Guardian was advised Release of Information must be obtained prior to any  record release in order to collaborate their care with an outside provider. Patient/Guardian was advised if they have not already done so to contact the registration department to sign all necessary forms in order for [place]  to release information regarding their care.   Consent: Patient/Guardian gives verbal consent for treatment and assignment of benefits for services provided during this visit. Patient/Guardian expressed understanding and agreed to proceed.    Yesenia Spiller, MD 07/21/2022, 11:39 AM

## 2022-07-22 DIAGNOSIS — Z1231 Encounter for screening mammogram for malignant neoplasm of breast: Secondary | ICD-10-CM | POA: Diagnosis not present

## 2022-07-31 DIAGNOSIS — Z789 Other specified health status: Secondary | ICD-10-CM | POA: Diagnosis not present

## 2022-07-31 DIAGNOSIS — I10 Essential (primary) hypertension: Secondary | ICD-10-CM | POA: Diagnosis not present

## 2022-07-31 DIAGNOSIS — Z299 Encounter for prophylactic measures, unspecified: Secondary | ICD-10-CM | POA: Diagnosis not present

## 2022-07-31 DIAGNOSIS — E1165 Type 2 diabetes mellitus with hyperglycemia: Secondary | ICD-10-CM | POA: Diagnosis not present

## 2022-08-12 DIAGNOSIS — H401131 Primary open-angle glaucoma, bilateral, mild stage: Secondary | ICD-10-CM | POA: Diagnosis not present

## 2022-08-12 DIAGNOSIS — H26492 Other secondary cataract, left eye: Secondary | ICD-10-CM | POA: Diagnosis not present

## 2022-08-27 DIAGNOSIS — H26492 Other secondary cataract, left eye: Secondary | ICD-10-CM | POA: Diagnosis not present

## 2022-09-04 DIAGNOSIS — N390 Urinary tract infection, site not specified: Secondary | ICD-10-CM | POA: Diagnosis not present

## 2022-09-04 DIAGNOSIS — I10 Essential (primary) hypertension: Secondary | ICD-10-CM | POA: Diagnosis not present

## 2022-09-04 DIAGNOSIS — R350 Frequency of micturition: Secondary | ICD-10-CM | POA: Diagnosis not present

## 2022-09-04 DIAGNOSIS — Z299 Encounter for prophylactic measures, unspecified: Secondary | ICD-10-CM | POA: Diagnosis not present

## 2022-09-09 DIAGNOSIS — Z23 Encounter for immunization: Secondary | ICD-10-CM | POA: Diagnosis not present

## 2022-11-11 DIAGNOSIS — Z299 Encounter for prophylactic measures, unspecified: Secondary | ICD-10-CM | POA: Diagnosis not present

## 2022-11-11 DIAGNOSIS — I10 Essential (primary) hypertension: Secondary | ICD-10-CM | POA: Diagnosis not present

## 2022-11-11 DIAGNOSIS — E1159 Type 2 diabetes mellitus with other circulatory complications: Secondary | ICD-10-CM | POA: Diagnosis not present

## 2022-11-11 DIAGNOSIS — I152 Hypertension secondary to endocrine disorders: Secondary | ICD-10-CM | POA: Diagnosis not present

## 2022-11-11 DIAGNOSIS — E785 Hyperlipidemia, unspecified: Secondary | ICD-10-CM | POA: Diagnosis not present

## 2022-11-11 DIAGNOSIS — Z789 Other specified health status: Secondary | ICD-10-CM | POA: Diagnosis not present

## 2022-11-11 DIAGNOSIS — Z0000 Encounter for general adult medical examination without abnormal findings: Secondary | ICD-10-CM | POA: Diagnosis not present

## 2022-11-21 ENCOUNTER — Encounter (HOSPITAL_COMMUNITY): Payer: Self-pay | Admitting: Psychiatry

## 2022-11-21 ENCOUNTER — Telehealth (INDEPENDENT_AMBULATORY_CARE_PROVIDER_SITE_OTHER): Payer: Medicare Other | Admitting: Psychiatry

## 2022-11-21 DIAGNOSIS — F411 Generalized anxiety disorder: Secondary | ICD-10-CM | POA: Diagnosis not present

## 2022-11-21 MED ORDER — ALPRAZOLAM 0.25 MG PO TABS: 0.2500 mg | ORAL_TABLET | Freq: Three times a day (TID) | ORAL | 3 refills | Status: DC

## 2022-11-21 MED ORDER — BUSPIRONE HCL 15 MG PO TABS: 15.0000 mg | ORAL_TABLET | Freq: Two times a day (BID) | ORAL | 3 refills | Status: DC

## 2022-11-21 MED ORDER — VENLAFAXINE HCL ER 150 MG PO CP24: ORAL_CAPSULE | ORAL | 3 refills | Status: DC

## 2022-11-21 NOTE — Progress Notes (Signed)
Virtual Visit via Telephone Note  I connected with Yesenia Reynolds on 11/21/22 at  9:00 AM EST by telephone and verified that I am speaking with the correct person using two identifiers.  Location: Patient: home Provider: office   I discussed the limitations, risks, security and privacy concerns of performing an evaluation and management service by telephone and the availability of in person appointments. I also discussed with the patient that there may be a patient responsible charge related to this service. The patient expressed understanding and agreed to proceed.      I discussed the assessment and treatment plan with the patient. The patient was provided an opportunity to ask questions and all were answered. The patient agreed with the plan and demonstrated an understanding of the instructions.   The patient was advised to call back or seek an in-person evaluation if the symptoms worsen or if the condition fails to improve as anticipated.  I provided 15 minutes of non-face-to-face time during this encounter.   [name], MD  [facility] MD/PA/NP OP Progress Note  11/21/2022 9:08 AM Yesenia Reynolds  MRN:  [id number]  Chief Complaint:  Chief Complaint  Patient presents with   Depression   Anxiety   Follow-up   HPI: This patient is an 81 year old single white female who lives in a local restaurant.  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  The patient returns for follow-up after 4 months regarding her depression and anxiety.  She states that she is doing very well.  She denies any symptoms of sadness or depression shakiness or anxiety.  She denies thoughts of suicide or self-harm.  She is sleeping well.  She is connecting well with the people and the rest home as well as family.  Her health has been stable Visit Diagnosis:    ICD-10-CM   1. Generalized anxiety disorder  F41.1 ALPRAZolam (XANAX) 0.25 MG tablet      Past  Psychiatric History: Hospitalized about 5 years ago for depression and anxiety which responded to ECT  Past Medical History:  Past Medical History:  Diagnosis Date   Agoraphobia with panic attacks 08/07/2016   Anxiety    Arthritis    Atrial fibrillation, currently in sinus rhythm    Cancer ([place])    Endometrial   Depression    Endometrial cancer, FIGO stage IIIC ([place]) 08/08/2015   Essential hypertension, benign    GERD (gastroesophageal reflux disease)    Hypothyroidism    Mixed hyperlipidemia    Palpitations    Spinal stenosis    Spinal stenosis of lumbar region at multiple levels 08/07/2016   Type 2 diabetes mellitus ([place])     Past Surgical History:  Procedure Laterality Date   ABDOMINAL HYSTERECTOMY     BREAST BIOPSY     benign   CATARACT EXTRACTION W/PHACO Left 03/31/2017   Procedure: CATARACT EXTRACTION PHACO AND INTRAOCULAR LENS PLACEMENT ([place]);  Surgeon: Rutherford Guys, MD;  Location: [facility];  Service: Ophthalmology;  Laterality: Left;  CDE: 8.67   CATARACT EXTRACTION W/PHACO Right 04/14/2017   Procedure: CATARACT EXTRACTION PHACO AND INTRAOCULAR LENS PLACEMENT RIGHT EYE;  Surgeon: Rutherford Guys, MD;  Location: [facility];  Service: Ophthalmology;  Laterality: Right;  CDE: 8.52   LIPOMA EXCISION     Multiple dental extractions     PORT-A-CATH REMOVAL Right 09/02/2017   Procedure: MINOR REMOVAL PORT-A-CATH;  Surgeon: Aviva Signs, MD;  Location: [facility];  Service: General;  Laterality: Right;   TONSILLECTOMY AND ADENOIDECTOMY     TOTAL KNEE ARTHROPLASTY  08/04/2012   Procedure: TOTAL KNEE ARTHROPLASTY;  Surgeon: Gearlean Alf, MD;  Location: [facility];  Service: Orthopedics;  Laterality: Left;    Family Psychiatric History: See below  Family History:  Family History  Problem Relation Age of Onset   Stroke Mother    Heart failure Mother    Hypertension Father    Coronary artery disease Father     Social History:  Social History   Socioeconomic History   Marital status:  Single    Spouse name: Not on file   Number of children: Not on file   Years of education: Not on file   Highest education level: Not on file  Occupational History   Not on file  Tobacco Use   Smoking status: Never   Smokeless tobacco: Never  Substance and Sexual Activity   Alcohol use: No    Comment: 07/10/2016 per pt no   Drug use: No    Comment: 8/3/2017per pt no    Sexual activity: Never  Other Topics Concern   Not on file  Social History Narrative   Not on file   Social Determinants of Health   Financial Resource Strain: Not on file  Food Insecurity: Not on file  Transportation Needs: Not on file  Physical Activity: Not on file  Stress: Not on file  Social Connections: Not on file    Allergies:  Allergies  Allergen Reactions   Morphine And Related Other (See Comments)    Hypotension   Other     Preservative in latanoprost causes redness, pt uses preservative free latanoprost    Paxil [Paroxetine Hcl]     unknown   Latex Rash    Metabolic Disorder Labs: No results found for: "HGBA1C", "MPG" No results found for: "PROLACTIN" No results found for: "CHOL", "TRIG", "HDL", "CHOLHDL", "VLDL", "LDLCALC" No results found for: "TSH"  Therapeutic Level Labs: No results found for: "LITHIUM" No results found for: "VALPROATE" No results found for: "CBMZ"  Current Medications: Current Outpatient Medications  Medication Sig Dispense Refill   acetaminophen (TYLENOL 8 HOUR ARTHRITIS PAIN) 650 MG CR tablet Take 650 mg by mouth every 8 (eight) hours as needed for pain.     ALPRAZolam (XANAX) 0.25 MG tablet Take 1 tablet (0.25 mg total) by mouth 3 (three) times daily. 90 tablet 3   alum & mag hydroxide-simeth (MAALOX/MYLANTA) 200-200-20 MG/5ML suspension Take 30 mLs by mouth every 6 (six) hours as needed for indigestion or heartburn.     aspirin 81 MG tablet Take 81 mg by mouth at bedtime.      atorvastatin (LIPITOR) 10 MG tablet Take 10 mg by mouth every evening.       busPIRone (BUSPAR) 15 MG tablet Take 1 tablet (15 mg total) by mouth 2 (two) times daily. 60 tablet 3   calcium carbonate (TUMS - DOSED IN MG ELEMENTAL CALCIUM) 500 MG chewable tablet Chew 1 tablet by mouth daily as needed for indigestion or heartburn.     ibuprofen (ADVIL,MOTRIN) 200 MG tablet Take 200 mg by mouth 2 (two) times daily as needed for moderate pain.      latanoprost (XALATAN) 0.005 % ophthalmic solution Place 1 drop into both eyes at bedtime.     levothyroxine (SYNTHROID, LEVOTHROID) 88 MCG tablet Take 88 mcg by mouth daily before breakfast.      lisinopril (PRINIVIL,ZESTRIL) 10 MG tablet Take 10 mg by  mouth every morning.      loratadine (CLARITIN) 10 MG tablet Take 10 mg by mouth daily as needed for allergies.     metFORMIN (GLUCOPHAGE) 500 MG tablet Take by mouth.     metoprolol succinate (TOPROL-XL) 25 MG 24 hr tablet take 1/2 tablet BY MOUTH every morning 3 tablet 0   metoprolol succinate (TOPROL-XL) 25 MG 24 hr tablet take 1/2 tablet BY MOUTH every morning 3 tablet 0   polyethylene glycol (MIRALAX / GLYCOLAX) packet Take 17 g by mouth daily as needed for mild constipation.      venlafaxine XR (EFFEXOR-XR) 150 MG 24 hr capsule TAKE ONE CAPSULE BY MOUTH DAILY WITH breakast 30 capsule 3   No current facility-administered medications for this visit.     Musculoskeletal: Strength & Muscle Tone: na Gait & Station: na Patient leans: N/A  Psychiatric Specialty Exam: Review of Systems  All other systems reviewed and are negative.   There were no vitals taken for this visit.There is no height or weight on file to calculate BMI.  General Appearance: NA  Eye Contact:  NA  Speech:  Clear and Coherent  Volume:  Normal  Mood:  Euthymic  Affect:  Congruent  Thought Process:  Goal Directed  Orientation:  Full (Time, Place, and Person)  Thought Content: WDL   Suicidal Thoughts:  No  Homicidal Thoughts:  No  Memory:  Immediate;   Good Recent;   Good Remote;   Good   Judgement:  Good  Insight:  Fair  Psychomotor Activity:  Normal  Concentration:  Concentration: Good and Attention Span: Good  Recall:  Good  Fund of Knowledge: Good  Language: Good  Akathisia:  No  Handed:  Right  AIMS (if indicated): not done  Assets:  Communication Skills Desire for Improvement Resilience Social Support Talents/Skills  ADL's:  Intact  Cognition: WNL  Sleep:  Good   Screenings: GAD-7    Flowsheet Row Counselor from 04/20/2018 in [place] from 09/15/2016 in [place] [facility]  Total GAD-7 Score 14 10      PHQ2-9    Flowsheet Row Video Visit from 07/21/2022 in [place] [facility] Video Visit from 03/19/2022 in [place] [facility] Video Visit from 11/26/2021 in [place] from 08/05/2021 in [place] from 04/04/2021 in [place] [facility]  PHQ-2 Total Score 0 0 1 0 0      Flowsheet Row Video Visit from 07/21/2022 in [place] [facility] Video Visit from 03/19/2022 in [place] [facility] Video Visit from 11/26/2021 in [place] [facility]  C-SSRS RISK CATEGORY No Risk No Risk No Risk        Assessment and Plan: Patient is an 80 year old female with a history of severe depression and anxiety.  She continues to do well on her current regimen.  She will continue Xanax 0.25 mg 3 times daily for anxiety, BuSpar 15 mg twice daily for anxiety and Effexor XR 150 mg daily for depression.  She will return to see me in 4 months  Collaboration of Care: Collaboration of Care: Primary Care Provider AEB notes will be shared with PCP at patient's request  Patient/Guardian  was advised Release of Information must be obtained prior to any record release in order to collaborate their care with an outside provider. Patient/Guardian was advised if they have not already done so to contact  the registration department to sign all necessary forms in order for [place] to release information regarding their care.   Consent: Patient/Guardian gives verbal consent for treatment and assignment of benefits for services provided during this visit. Patient/Guardian expressed understanding and agreed to proceed.    [name], MD 11/21/2022, 9:08 AM

## 2022-12-11 DIAGNOSIS — E1129 Type 2 diabetes mellitus with other diabetic kidney complication: Secondary | ICD-10-CM | POA: Diagnosis not present

## 2022-12-11 DIAGNOSIS — I10 Essential (primary) hypertension: Secondary | ICD-10-CM | POA: Diagnosis not present

## 2022-12-11 DIAGNOSIS — R809 Proteinuria, unspecified: Secondary | ICD-10-CM | POA: Diagnosis not present

## 2022-12-11 DIAGNOSIS — Z299 Encounter for prophylactic measures, unspecified: Secondary | ICD-10-CM | POA: Diagnosis not present

## 2022-12-11 DIAGNOSIS — Z789 Other specified health status: Secondary | ICD-10-CM | POA: Diagnosis not present

## 2022-12-15 DIAGNOSIS — Z961 Presence of intraocular lens: Secondary | ICD-10-CM | POA: Diagnosis not present

## 2022-12-15 DIAGNOSIS — H401130 Primary open-angle glaucoma, bilateral, stage unspecified: Secondary | ICD-10-CM | POA: Diagnosis not present

## 2022-12-18 ENCOUNTER — Encounter (HOSPITAL_COMMUNITY): Payer: Self-pay | Admitting: Psychiatry

## 2022-12-18 ENCOUNTER — Telehealth (INDEPENDENT_AMBULATORY_CARE_PROVIDER_SITE_OTHER): Payer: Medicare Other | Admitting: Psychiatry

## 2022-12-18 DIAGNOSIS — F411 Generalized anxiety disorder: Secondary | ICD-10-CM

## 2022-12-18 NOTE — Progress Notes (Signed)
Virtual Visit via Video Note  I connected with Yesenia Reynolds on 12/18/22 at  3:20 PM EST by a video enabled telemedicine application and verified that I am speaking with the correct person using two identifiers.  Location: Patient: home Provider: office   I discussed the limitations of evaluation and management by telemedicine and the availability of in person appointments. The patient expressed understanding and agreed to proceed.     I discussed the assessment and treatment plan with the patient. The patient was provided an opportunity to ask questions and all were answered. The patient agreed with the plan and demonstrated an understanding of the instructions.   The patient was advised to call back or seek an in-person evaluation if the symptoms worsen or if the condition fails to improve as anticipated.  I provided 15 minutes of non-face-to-face time during this encounter.   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   The patient returns as a work in today.  She was just seen about 4 weeks ago.  She states that over Christmas she got sick with a bad sore throat.  She did not feel like swallowing or eating and she did not take most of her medications for almost 2 weeks.  Consequently she got more depressed and anxious.  She has been worrying a lot about things like her money and also to have other things.  She just started taking her medications back a few days ago and she is starting to feel better again.  She is eating now.  She is getting good sleep.  She is still a bit anxious but she thinks she is on the right track.  Her sister is there  and also concurs.  I suggested that she stay on the medications as prescribed and we meet again in 4 weeks and she agrees Visit Diagnosis:    ICD-10-CM   1. Generalized anxiety disorder  F41.1       Past Psychiatric History: Hospitalized about 6 years ago for depression and anxiety which responded to ECT  Past Medical History:  Past Medical History:  Diagnosis Date   Agoraphobia with panic attacks 08/07/2016   Anxiety    Arthritis    Atrial fibrillation, currently in sinus rhythm    Cancer ([place])    Endometrial   Depression    Endometrial cancer, FIGO stage IIIC ([place]) 08/08/2015   Essential hypertension, benign    GERD (gastroesophageal reflux disease)    Hypothyroidism    Mixed hyperlipidemia    Palpitations    Spinal stenosis    Spinal stenosis of lumbar region at multiple levels 08/07/2016   Type 2 diabetes mellitus [facility])     Past Surgical History:  Procedure Laterality Date   ABDOMINAL HYSTERECTOMY     BREAST BIOPSY     benign   CATARACT EXTRACTION W/PHACO Left 03/31/2017   Procedure: CATARACT EXTRACTION PHACO AND INTRAOCULAR LENS PLACEMENT ([place]);  Surgeon: Rutherford Guys, MD;  Location: [facility];  Service: Ophthalmology;  Laterality: Left;  CDE: 8.67   CATARACT EXTRACTION W/PHACO Right 04/14/2017   Procedure: CATARACT  EXTRACTION PHACO AND INTRAOCULAR LENS PLACEMENT RIGHT EYE;  Surgeon: Rutherford Guys, MD;  Location: [facility];  Service: Ophthalmology;  Laterality: Right;  CDE: 8.52   LIPOMA EXCISION     Multiple dental extractions     PORT-A-CATH REMOVAL Right 09/02/2017   Procedure: MINOR REMOVAL PORT-A-CATH;  Surgeon: Aviva Signs, MD;  Location: [facility];  Service: General;  Laterality: Right;   TONSILLECTOMY AND ADENOIDECTOMY     TOTAL KNEE ARTHROPLASTY  08/04/2012   Procedure: TOTAL KNEE ARTHROPLASTY;  Surgeon: Gearlean Alf, MD;  Location: [facility];  Service: Orthopedics;  Laterality: Left;    Family Psychiatric History: See below  Family History:  Family History  Problem  Relation Age of Onset   Stroke Mother    Heart failure Mother    Hypertension Father    Coronary artery disease Father     Social History:  Social History   Socioeconomic History   Marital status: Single    Spouse name: Not on file   Number of children: Not on file   Years of education: Not on file   Highest education level: Not on file  Occupational History   Not on file  Tobacco Use   Smoking status: Never   Smokeless tobacco: Never  Substance and Sexual Activity   Alcohol use: No    Comment: 07/10/2016 per pt no   Drug use: No    Comment: 8/3/2017per pt no    Sexual activity: Never  Other Topics Concern   Not on file  Social History Narrative   Not on file   Social Determinants of Health   Financial Resource Strain: Not on file  Food Insecurity: Not on file  Transportation Needs: Not on file  Physical Activity: Not on file  Stress: Not on file  Social Connections: Not on file    Allergies:  Allergies  Allergen Reactions   Morphine And Related Other (See Comments)    Hypotension   Other     Preservative in latanoprost causes redness, pt uses preservative free latanoprost    Paxil [Paroxetine Hcl]     unknown   Latex Rash    Metabolic Disorder Labs: No results found for: "HGBA1C", "MPG" No results found for: "PROLACTIN" No results found for: "CHOL", "TRIG", "HDL", "CHOLHDL", "VLDL", "LDLCALC" No results found for: "TSH"  Therapeutic Level Labs: No results found for: "LITHIUM" No results found for: "VALPROATE" No results found for: "CBMZ"  Current Medications: Current Outpatient Medications  Medication Sig Dispense Refill   acetaminophen (TYLENOL 8 HOUR ARTHRITIS PAIN) 650 MG CR tablet Take 650 mg by mouth every 8 (eight) hours as needed for pain.     ALPRAZolam (XANAX) 0.25 MG tablet Take 1 tablet (0.25 mg total) by mouth 3 (three) times daily. 90 tablet 3   alum & mag hydroxide-simeth (MAALOX/MYLANTA) 200-200-20 MG/5ML suspension Take 30 mLs by  mouth every 6 (six) hours as needed for indigestion or heartburn.     aspirin 81 MG tablet Take 81 mg by mouth at bedtime.      atorvastatin (LIPITOR) 10 MG tablet Take 10 mg by mouth every evening.      busPIRone (BUSPAR) 15 MG tablet Take 1 tablet (15 mg total) by mouth 2 (two) times daily. 60 tablet 3   calcium carbonate (TUMS - DOSED IN MG ELEMENTAL CALCIUM) 500 MG chewable tablet Chew 1 tablet by mouth daily as needed for indigestion or heartburn.     ibuprofen (ADVIL,MOTRIN) 200 MG tablet Take 200 mg by  mouth 2 (two) times daily as needed for moderate pain.      latanoprost (XALATAN) 0.005 % ophthalmic solution Place 1 drop into both eyes at bedtime.     levothyroxine (SYNTHROID, LEVOTHROID) 88 MCG tablet Take 88 mcg by mouth daily before breakfast.      lisinopril (PRINIVIL,ZESTRIL) 10 MG tablet Take 10 mg by mouth every morning.      loratadine (CLARITIN) 10 MG tablet Take 10 mg by mouth daily as needed for allergies.     metFORMIN (GLUCOPHAGE) 500 MG tablet Take by mouth.     metoprolol succinate (TOPROL-XL) 25 MG 24 hr tablet take 1/2 tablet BY MOUTH every morning 3 tablet 0   metoprolol succinate (TOPROL-XL) 25 MG 24 hr tablet take 1/2 tablet BY MOUTH every morning 3 tablet 0   polyethylene glycol (MIRALAX / GLYCOLAX) packet Take 17 g by mouth daily as needed for mild constipation.      venlafaxine XR (EFFEXOR-XR) 150 MG 24 hr capsule TAKE ONE CAPSULE BY MOUTH DAILY WITH breakast 30 capsule 3   No current facility-administered medications for this visit.     Musculoskeletal: Strength & Muscle Tone: decreased Gait & Station: unsteady Patient leans: N/A  Psychiatric Specialty Exam: Review of Systems  Musculoskeletal:  Positive for arthralgias.  Psychiatric/Behavioral:  The patient is nervous/anxious.   All other systems reviewed and are negative.   There were no vitals taken for this visit.There is no height or weight on file to calculate BMI.  General Appearance: Casual and  Fairly Groomed  Eye Contact:  Good  Speech:  Clear and Coherent  Volume:  Normal  Mood:  Anxious  Affect:  Congruent  Thought Process:  Goal Directed  Orientation:  Full (Time, Place, and Person)  Thought Content: Rumination   Suicidal Thoughts:  No  Homicidal Thoughts:  No  Memory:  Immediate;   Good Recent;   Fair Remote;   NA  Judgement:  Fair  Insight:  Fair  Psychomotor Activity:  Decreased  Concentration:  Concentration: Fair and Attention Span: Fair  Recall:  [employer] of Knowledge: Fair  Language: Good  Akathisia:  No  Handed:  Right  AIMS (if indicated): not done  Assets:  Communication Skills Desire for Improvement Resilience Social Support  ADL's:  Intact  Cognition: WNL  Sleep:  Good   Screenings: GAD-7    Flowsheet Row Counselor from 04/20/2018 in [place] Counselor from 09/15/2016 in [place] [facility]  Total GAD-7 Score 14 10      PHQ2-9    Flowsheet Row Video Visit from 07/21/2022 in [place] [facility] Video Visit from 03/19/2022 in [place] [facility] Video Visit from 11/26/2021 in [place] from 08/05/2021 in [place] from 04/04/2021 in [place] [facility]  PHQ-2 Total Score 0 0 1 0 0      Flowsheet Row Video Visit from 07/21/2022 in [place] [facility] Video Visit from 03/19/2022 in [place] [facility] Video Visit from 11/26/2021 in [place] No Risk No Risk No Risk        Assessment and Plan: This patient is an 82 year old female with a history of severe depression and anxiety.  She was doing very well on her current  regimen.  When she got off her medications when she was sick she seemed to decline in  her mental status.  She is doing better now.  She will continue Xanax 0.25 mg 3 times daily for anxiety, BuSpar 15 mg twice daily for anxiety and Effexor XR 150 mg daily for depression.  She will return to see me in 4 weeks  Collaboration of Care: Collaboration of Care: Primary Care Provider AEB notes will be shared with PCP at patient's request  Patient/Guardian was advised Release of Information must be obtained prior to any record release in order to collaborate their care with an outside provider. Patient/Guardian was advised if they have not already done so to contact the registration department to sign all necessary forms in order for [place] to release information regarding their care.   Consent: Patient/Guardian gives verbal consent for treatment and assignment of benefits for services provided during this visit. Patient/Guardian expressed understanding and agreed to proceed.    [name], MD 12/18/2022, 3:46 PM

## 2023-01-01 DIAGNOSIS — E1129 Type 2 diabetes mellitus with other diabetic kidney complication: Secondary | ICD-10-CM | POA: Diagnosis not present

## 2023-01-01 DIAGNOSIS — I10 Essential (primary) hypertension: Secondary | ICD-10-CM | POA: Diagnosis not present

## 2023-01-01 DIAGNOSIS — Z299 Encounter for prophylactic measures, unspecified: Secondary | ICD-10-CM | POA: Diagnosis not present

## 2023-01-01 DIAGNOSIS — R809 Proteinuria, unspecified: Secondary | ICD-10-CM | POA: Diagnosis not present

## 2023-01-06 ENCOUNTER — Ambulatory Visit (HOSPITAL_COMMUNITY): Payer: Medicare Other | Admitting: Psychiatry

## 2023-01-20 ENCOUNTER — Ambulatory Visit (INDEPENDENT_AMBULATORY_CARE_PROVIDER_SITE_OTHER): Payer: Medicare Other | Admitting: Psychiatry

## 2023-01-20 ENCOUNTER — Telehealth (HOSPITAL_COMMUNITY): Payer: Self-pay | Admitting: *Deleted

## 2023-01-20 ENCOUNTER — Encounter (HOSPITAL_COMMUNITY): Payer: Self-pay | Admitting: Psychiatry

## 2023-01-20 DIAGNOSIS — F411 Generalized anxiety disorder: Secondary | ICD-10-CM

## 2023-01-20 MED ORDER — VENLAFAXINE HCL ER 150 MG PO CP24: ORAL_CAPSULE | ORAL | 3 refills | Status: DC

## 2023-01-20 MED ORDER — ALPRAZOLAM 0.25 MG PO TABS: 0.2500 mg | ORAL_TABLET | Freq: Four times a day (QID) | ORAL | 3 refills | Status: DC

## 2023-01-20 MED ORDER — BUSPIRONE HCL 15 MG PO TABS: 15.0000 mg | ORAL_TABLET | Freq: Two times a day (BID) | ORAL | 3 refills | Status: DC

## 2023-01-20 NOTE — Progress Notes (Signed)
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   The patient returns for follow-up after 4 weeks.  She is in person today with her sister.  Last time she told me she missed about 2 weeks of medications when she was sick in December.  However her sister informs me that she missed more like 2 months and it was not due to being sick.  She simply would push them out of the bubble pack and not take them.  The patient cannot explain why she was doing this but she claims since we last talked she has been totally compliant with her medicines.  Her sister also states that she was hoarding all sorts of papers from the mail that had been coming for the last 5 to 7 years and she finally cleaned out her room.  The patient states that her mood is improving.  She still somewhat anxious and asked if we can increase the Xanax just a little bit and I think this is reasonable.  She is generally sleeping well.  She is trying to eat healthier and has lost a few pounds.  She denies significant depression or thoughts of self-harm.  She is walking around the facility with a walker. Visit Diagnosis:    ICD-10-CM   1. Generalized anxiety disorder  F41.1 ALPRAZolam (XANAX) 0.25 MG tablet      Past Psychiatric History: Hospitalized about 6 years ago for depression and anxiety which responded to ECT  Past Medical History:  Past Medical History:  Diagnosis Date   Agoraphobia with panic attacks 08/07/2016   Anxiety    Arthritis    Atrial fibrillation, currently in sinus rhythm    Cancer ([place])    Endometrial   Depression    Endometrial cancer, FIGO stage IIIC ([place]) 08/08/2015   Essential hypertension,  benign    GERD (gastroesophageal reflux disease)    Hypothyroidism    Mixed hyperlipidemia    Palpitations    Spinal stenosis    Spinal stenosis of lumbar region at multiple levels 08/07/2016   Type 2 diabetes mellitus ([place])     Past Surgical History:  Procedure Laterality Date   ABDOMINAL HYSTERECTOMY     BREAST BIOPSY     benign   CATARACT EXTRACTION W/PHACO Left 03/31/2017   Procedure: CATARACT EXTRACTION PHACO AND INTRAOCULAR LENS PLACEMENT ([place]);  Surgeon: Rutherford Guys, MD;  Location: [facility];  Service: Ophthalmology;  Laterality: Left;  CDE: 8.67   CATARACT EXTRACTION W/PHACO Right 04/14/2017   Procedure: CATARACT EXTRACTION PHACO AND INTRAOCULAR LENS PLACEMENT RIGHT EYE;  Surgeon: Rutherford Guys, MD;  Location: [facility];  Service: Ophthalmology;  Laterality: Right;  CDE: 8.52   LIPOMA EXCISION     Multiple dental extractions     PORT-A-CATH REMOVAL Right 09/02/2017   Procedure: MINOR REMOVAL PORT-A-CATH;  Surgeon: Aviva Signs, MD;  Location: [facility];  Service: General;  Laterality: Right;   TONSILLECTOMY AND ADENOIDECTOMY     TOTAL KNEE ARTHROPLASTY  08/04/2012   Procedure: TOTAL KNEE ARTHROPLASTY;  Surgeon: Gearlean Alf, MD;  Location: [facility];  Service: Orthopedics;  Laterality: Left;  Family Psychiatric History: See below  Family History:  Family History  Problem Relation Age of Onset   Stroke Mother    Heart failure Mother    Hypertension Father    Coronary artery disease Father     Social History:  Social History   Socioeconomic History   Marital status: Single    Spouse name: Not on file   Number of children: Not on file   Years of education: Not on file   Highest education level: Not on file  Occupational History   Not on file  Tobacco Use   Smoking status: Never   Smokeless tobacco: Never  Substance and Sexual Activity   Alcohol use: No    Comment: 07/10/2016 per pt no   Drug use: No    Comment: 8/3/2017per pt no    Sexual activity: Never  Other  Topics Concern   Not on file  Social History Narrative   Not on file   Social Determinants of Health   Financial Resource Strain: Not on file  Food Insecurity: Not on file  Transportation Needs: Not on file  Physical Activity: Not on file  Stress: Not on file  Social Connections: Not on file    Allergies:  Allergies  Allergen Reactions   Morphine And Related Other (See Comments)    Hypotension   Other     Preservative in latanoprost causes redness, pt uses preservative free latanoprost    Paxil [Paroxetine Hcl]     unknown   Latex Rash    Metabolic Disorder Labs: No results found for: "HGBA1C", "MPG" No results found for: "PROLACTIN" No results found for: "CHOL", "TRIG", "HDL", "CHOLHDL", "VLDL", "LDLCALC" No results found for: "TSH"  Therapeutic Level Labs: No results found for: "LITHIUM" No results found for: "VALPROATE" No results found for: "CBMZ"  Current Medications: Current Outpatient Medications  Medication Sig Dispense Refill   acetaminophen (TYLENOL 8 HOUR ARTHRITIS PAIN) 650 MG CR tablet Take 650 mg by mouth every 8 (eight) hours as needed for pain.     alum & mag hydroxide-simeth (MAALOX/MYLANTA) 200-200-20 MG/5ML suspension Take 30 mLs by mouth every 6 (six) hours as needed for indigestion or heartburn.     aspirin 81 MG tablet Take 81 mg by mouth at bedtime.      atorvastatin (LIPITOR) 10 MG tablet Take 10 mg by mouth every evening.      calcium carbonate (TUMS - DOSED IN MG ELEMENTAL CALCIUM) 500 MG chewable tablet Chew 1 tablet by mouth daily as needed for indigestion or heartburn.     ibuprofen (ADVIL,MOTRIN) 200 MG tablet Take 200 mg by mouth 2 (two) times daily as needed for moderate pain.      latanoprost (XALATAN) 0.005 % ophthalmic solution Place 1 drop into both eyes at bedtime.     levothyroxine (SYNTHROID, LEVOTHROID) 88 MCG tablet Take 88 mcg by mouth daily before breakfast.      lisinopril (PRINIVIL,ZESTRIL) 10 MG tablet Take 10 mg by mouth  every morning.      loratadine (CLARITIN) 10 MG tablet Take 10 mg by mouth daily as needed for allergies.     metoprolol succinate (TOPROL-XL) 25 MG 24 hr tablet take 1/2 tablet BY MOUTH every morning 3 tablet 0   metoprolol succinate (TOPROL-XL) 25 MG 24 hr tablet take 1/2 tablet BY MOUTH every morning 3 tablet 0   polyethylene glycol (MIRALAX / GLYCOLAX) packet Take 17 g by mouth daily as needed for mild constipation.  ALPRAZolam (XANAX) 0.25 MG tablet Take 1 tablet (0.25 mg total) by mouth 4 (four) times daily. 120 tablet 3   busPIRone (BUSPAR) 15 MG tablet Take 1 tablet (15 mg total) by mouth 2 (two) times daily. 60 tablet 3   metFORMIN (GLUCOPHAGE) 500 MG tablet Take by mouth.     venlafaxine XR (EFFEXOR-XR) 150 MG 24 hr capsule TAKE ONE CAPSULE BY MOUTH DAILY WITH breakast 30 capsule 3   No current facility-administered medications for this visit.     Musculoskeletal: Strength & Muscle Tone: decreased Gait & Station: unsteady Patient leans: N/A  Psychiatric Specialty Exam: Review of Systems  Blood pressure 129/70, pulse 85, height 5' 3"$  (1.6 m), weight 192 lb 12.8 oz (87.5 kg), SpO2 95 %.Body mass index is 34.15 kg/m.  General Appearance: Casual and Fairly Groomed  Eye Contact:  Good  Speech:  Clear and Coherent  Volume:  Normal  Mood:  Anxious and Euthymic  Affect:  Congruent  Thought Process:  Goal Directed  Orientation:  Full (Time, Place, and Person)  Thought Content: Logical   Suicidal Thoughts:  No  Homicidal Thoughts:  No  Memory:  Immediate;   Good Recent;   Fair Remote;   NA  Judgement:  Fair  Insight:  Fair  Psychomotor Activity:  Tremor  Concentration:  Concentration: Fair and Attention Span: Fair  Recall:  Good  Fund of Knowledge: Good  Language: Good  Akathisia:  No  Handed:  Right  AIMS (if indicated): not done  Assets:  Communication Skills Desire for Improvement Resilience Social Support  ADL's:  Intact  Cognition: WNL  Sleep:  Good    Screenings: GAD-7    Flowsheet Row Office Visit from 01/20/2023 in [place] at [place] from 04/20/2018 in [place] at [place] from 09/15/2016 in [place] at [place]  Total GAD-7 Score 5 14 10      $ PHQ2-9    [place] Office Visit from 01/20/2023 in [place] at [place] Video Visit from 07/21/2022 in [place] at [place] Video Visit from 03/19/2022 in [place] at [place] Video Visit from 11/26/2021 in [place] at [place] from 08/05/2021 in [place] at [facility] Total Score 2 0 0 1 0  PHQ-9 Total Score 7 -- -- -- --      Flowsheet Row Video Visit from 07/21/2022 in [place] at [place] Video Visit from 03/19/2022 in [place] at [place] Video Visit from 11/26/2021 in [place] at [place] No Risk No Risk No Risk        Assessment and Plan: This patient is an 82 year old female with a history of significant depression and anxiety.  She was doing well until she stopped her medications abruptly.  I beginning to wonder about some memory loss and/or confusion.  However her sister is keeping a close eye on her and she seems to be compliant with all of her medicines.  Since she is more anxious we will increase Xanax to 0.25 mg 4 times daily, continue BuSpar 15 mg twice daily for anxiety and Effexor XR 150 mg daily for depression.  She will return to see me in 2 months  Collaboration of Care: Collaboration of Care: Primary Care Provider AEB notes will be shared with PCP at  patient's request  Patient/Guardian was advised Release of Information must be obtained prior to  any record release in order to collaborate their care with an outside provider. Patient/Guardian was advised if they have not already done so to contact the registration department to sign all necessary forms in order for [place] to release information regarding their care.   Consent: Patient/Guardian gives verbal consent for treatment and assignment of benefits for services provided during this visit. Patient/Guardian expressed understanding and agreed to proceed.    [name], MD 01/20/2023, 3:28 PM

## 2023-01-20 NOTE — Telephone Encounter (Signed)
Per pt she thought she was taking a medication twice a day but she is taking it correctly and only taking it ones a day. Per pt she have been taking it correctly the entire time.

## 2023-01-21 NOTE — Telephone Encounter (Signed)
okay

## 2023-01-21 NOTE — Telephone Encounter (Signed)
noted 

## 2023-02-25 DIAGNOSIS — Z299 Encounter for prophylactic measures, unspecified: Secondary | ICD-10-CM | POA: Diagnosis not present

## 2023-02-25 DIAGNOSIS — E1159 Type 2 diabetes mellitus with other circulatory complications: Secondary | ICD-10-CM | POA: Diagnosis not present

## 2023-02-25 DIAGNOSIS — I10 Essential (primary) hypertension: Secondary | ICD-10-CM | POA: Diagnosis not present

## 2023-02-25 DIAGNOSIS — I152 Hypertension secondary to endocrine disorders: Secondary | ICD-10-CM | POA: Diagnosis not present

## 2023-03-20 ENCOUNTER — Telehealth (INDEPENDENT_AMBULATORY_CARE_PROVIDER_SITE_OTHER): Payer: Medicare HMO | Admitting: Psychiatry

## 2023-03-20 ENCOUNTER — Encounter (HOSPITAL_COMMUNITY): Payer: Self-pay | Admitting: Psychiatry

## 2023-03-20 DIAGNOSIS — F411 Generalized anxiety disorder: Secondary | ICD-10-CM

## 2023-03-20 MED ORDER — VENLAFAXINE HCL ER 150 MG PO CP24: ORAL_CAPSULE | ORAL | 3 refills | Status: DC

## 2023-03-20 MED ORDER — BUSPIRONE HCL 15 MG PO TABS: 15.0000 mg | ORAL_TABLET | Freq: Two times a day (BID) | ORAL | 3 refills | Status: DC

## 2023-03-20 MED ORDER — ALPRAZOLAM 0.25 MG PO TABS: 0.2500 mg | ORAL_TABLET | Freq: Four times a day (QID) | ORAL | 3 refills | Status: DC

## 2023-03-20 NOTE — Progress Notes (Signed)
Virtual Visit via Telephone Note  I connected with Yesenia Reynolds on 03/20/23 at 10:40 AM EDT by telephone and verified that I am speaking with the correct person using two identifiers.  Location: Patient: home Provider: office   I discussed the limitations, risks, security and privacy concerns of performing an evaluation and management service by telephone and the availability of in person appointments. I also discussed with the patient that there may be a patient responsible charge related to this service. The patient expressed understanding and agreed to proceed.      I discussed the assessment and treatment plan with the patient. The patient was provided an opportunity to ask questions and all were answered. The patient agreed with the plan and demonstrated an understanding of the instructions.   The patient was advised to call back or seek an in-person evaluation if the symptoms worsen or if the condition fails to improve as anticipated.  I provided 15 minutes of non-face-to-face time during this encounter.   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  Last time she was in the office with her sister and the sister explained the patient had been compliant with medications.  However she states since I last saw her she has been compliant.  She states her mood has been good and she denies significant anxiety.  I increased the Xanax a little bit so she would have an extra 1 to take if she was going out and she appreciates this.  She has been having a lot of diarrhea with out any noticed that  it makes it hard to plan for anything.  She thinks it is from the metformin and I urged her to talk to her PCP about this.  She is sleeping well and has continued to work on weight loss. Visit Diagnosis:    ICD-10-CM   1. Generalized anxiety disorder  F41.1 ALPRAZolam (XANAX) 0.25 MG tablet      Past Psychiatric History: Hospitalized about 6 years ago for depression and anxiety which responded to ECT  Past Medical History:  Past Medical History:  Diagnosis Date   Agoraphobia with panic attacks 08/07/2016   Anxiety    Arthritis    Atrial fibrillation, currently in sinus rhythm    Cancer    Endometrial   Depression    Endometrial cancer, FIGO stage IIIC 08/08/2015   Essential hypertension, benign    GERD (gastroesophageal reflux disease)    Hypothyroidism    Mixed hyperlipidemia    Palpitations    Spinal stenosis    Spinal stenosis of lumbar region at multiple levels 08/07/2016   Type 2 diabetes mellitus     Past Surgical History:  Procedure Laterality Date   ABDOMINAL HYSTERECTOMY     BREAST BIOPSY     benign   CATARACT EXTRACTION W/PHACO Left 03/31/2017   Procedure: CATARACT EXTRACTION PHACO AND INTRAOCULAR LENS PLACEMENT ([facility]);  Surgeon: Jethro Bolus, MD;  Location: [facility];  Service: Ophthalmology;  Laterality: Left;  CDE: 8.67   CATARACT EXTRACTION W/PHACO Right  04/14/2017   Procedure: CATARACT EXTRACTION PHACO AND INTRAOCULAR LENS PLACEMENT RIGHT EYE;  Surgeon: Jethro Bolus, MD;  Location: [facility];  Service: Ophthalmology;  Laterality: Right;  CDE: 8.52   LIPOMA EXCISION     Multiple dental extractions     PORT-A-CATH REMOVAL Right 09/02/2017   Procedure: MINOR REMOVAL PORT-A-CATH;  Surgeon: Franky Macho, MD;  Location: [facility];  Service: General;  Laterality: Right;   TONSILLECTOMY AND ADENOIDECTOMY     TOTAL KNEE ARTHROPLASTY  08/04/2012   Procedure: TOTAL KNEE ARTHROPLASTY;  Surgeon: Loanne Drilling, MD;  [facility];  Service: Orthopedics;  Laterality: Left;     Family Psychiatric History: see below  Family History:  Family History  Problem Relation Age of Onset   Stroke Mother    Heart failure Mother    Hypertension Father    Coronary artery disease Father     Social History:  Social History   Socioeconomic History   Marital status: Single    Spouse name: Not on file   Number of children: Not on file   Years of education: Not on file   Highest education level: Not on file  Occupational History   Not on file  Tobacco Use   Smoking status: Never   Smokeless tobacco: Never  Substance and Sexual Activity   Alcohol use: No    Comment: 07/10/2016 per pt no   Drug use: No    Comment: 8/3/2017per pt no    Sexual activity: Never  Other Topics Concern   Not on file  Social History Narrative   Not on file   Social Determinants of Health   Financial Resource Strain: Not on file  Food Insecurity: Not on file  Transportation Needs: Not on file  Physical Activity: Not on file  Stress: Not on file  Social Connections: Not on file    Allergies:  Allergies  Allergen Reactions   Morphine And Related Other (See Comments)    Hypotension   Other     Preservative in latanoprost causes redness, pt uses preservative free latanoprost    Paxil [Paroxetine Hcl]     unknown   Latex Rash    Metabolic Disorder Labs: No results found for: "HGBA1C", "MPG" No results found for: "PROLACTIN" No results found for: "CHOL", "TRIG", "HDL", "CHOLHDL", "VLDL", "LDLCALC" No results found for: "TSH"  Therapeutic Level Labs: No results found for: "LITHIUM" No results found for: "VALPROATE" No results found for: "CBMZ"  Current Medications: Current Outpatient Medications  Medication Sig Dispense Refill   acetaminophen (TYLENOL 8 HOUR ARTHRITIS PAIN) 650 MG CR tablet Take 650 mg by mouth every 8 (eight) hours as needed for pain.     ALPRAZolam (XANAX) 0.25 MG tablet Take 1 tablet (0.25 mg total) by mouth 4 (four) times daily. 120 tablet 3    alum & mag hydroxide-simeth (MAALOX/MYLANTA) 200-200-20 MG/5ML suspension Take 30 mLs by mouth every 6 (six) hours as needed for indigestion or heartburn.     aspirin 81 MG tablet Take 81 mg by mouth at bedtime.      atorvastatin (LIPITOR) 10 MG tablet Take 10 mg by mouth every evening.      busPIRone (BUSPAR) 15 MG tablet Take 1 tablet (15 mg total) by mouth 2 (two) times daily. 60 tablet 3   calcium carbonate (TUMS - DOSED IN MG ELEMENTAL CALCIUM) 500 MG chewable tablet Chew 1 tablet by mouth daily as needed for indigestion or heartburn.     ibuprofen (ADVIL,MOTRIN) 200 MG  tablet Take 200 mg by mouth 2 (two) times daily as needed for moderate pain.      latanoprost (XALATAN) 0.005 % ophthalmic solution Place 1 drop into both eyes at bedtime.     levothyroxine (SYNTHROID, LEVOTHROID) 88 MCG tablet Take 88 mcg by mouth daily before breakfast.      lisinopril (PRINIVIL,ZESTRIL) 10 MG tablet Take 10 mg by mouth every morning.      loratadine (CLARITIN) 10 MG tablet Take 10 mg by mouth daily as needed for allergies.     metFORMIN (GLUCOPHAGE) 500 MG tablet Take by mouth.     metoprolol succinate (TOPROL-XL) 25 MG 24 hr tablet take 1/2 tablet BY MOUTH every morning 3 tablet 0   metoprolol succinate (TOPROL-XL) 25 MG 24 hr tablet take 1/2 tablet BY MOUTH every morning 3 tablet 0   polyethylene glycol (MIRALAX / GLYCOLAX) packet Take 17 g by mouth daily as needed for mild constipation.      venlafaxine XR (EFFEXOR-XR) 150 MG 24 hr capsule TAKE ONE CAPSULE BY MOUTH DAILY WITH breakast 30 capsule 3   No current facility-administered medications for this visit.     Musculoskeletal: Strength & Muscle Tone: na Gait & Station: na Patient leans: N/A  Psychiatric Specialty Exam: Review of Systems  Gastrointestinal:  Positive for diarrhea.  Musculoskeletal:  Positive for back pain and gait problem.  All other systems reviewed and are negative.   There were no vitals taken for this visit.There is no  height or weight on file to calculate BMI.  General Appearance: NA  Eye Contact:  NA  Speech:  Clear and Coherent  Volume:  Normal  Mood:  Euthymic  Affect:  NA  Thought Process:  Goal Directed  Orientation:  Full (Time, Place, and Person)  Thought Content: WDL   Suicidal Thoughts:  No  Homicidal Thoughts:  No  Memory:  Immediate;   Good Recent;   Fair Remote;   NA  Judgement:  Good  Insight:  Fair  Psychomotor Activity:  Decreased  Concentration:  Concentration: Good and Attention Span: Good  Recall:  Good  Fund of Knowledge: Good  Language: Good  Akathisia:  No  Handed:  Right  AIMS (if indicated): not done  Assets:  Communication Skills Desire for Improvement Resilience Social Support Talents/Skills  ADL's:  Intact  Cognition: WNL  Sleep:  Good   Screenings: GAD-7    Flowsheet Row Office Visit from 01/20/2023 in [facility] at [place] Counselor from 04/20/2018 in [facility] at [place] Counselor from 09/15/2016 in [facility] at [place]  Total GAD-7 Score PHQ2-9    Flowsheet Row Office Visit from 01/20/2023 in [facility] at [place] Video Visit from 07/21/2022 in [facility] at [place] Video Visit from 03/19/2022 in [facility] at [place] Video Visit from 11/26/2021 in [facility] Out[facility] at [place] Telemedicine from 08/05/2021 in [facility] at [facility] Total Score 2 0 0 1 0  PHQ-9 Total Score 7 -- -- -- --      Flowsheet Row Video Visit from 07/21/2022 in [facility] at [place] Video Visit from 03/19/2022 in [facility] at [place] Video Visit from 11/26/2021 in [facility] at [facility] RISK CATEGORY No Risk  No Risk No Risk  Assessment and Plan: This patient is an 82 year old female with a history of significant depression and anxiety.  For now she has been compliant with medications and doing much better.  She will continue Xanax 0.25 mg 4 times daily as needed, BuSpar 15 mg twice daily also for anxiety and Effexor XR 150 mg daily for depression.  She will return to see me in 3 months.  Collaboration of Care: Collaboration of Care: Primary Care Provider AEB notes will be sent to PCP at patient's request  Patient/Guardian was advised Release of Information must be obtained prior to any record release in order to collaborate their care with an outside provider. Patient/Guardian was advised if they have not already done so to contact the registration department to sign all necessary forms in order for [place] to release information regarding their care.   Consent: Patient/Guardian gives verbal consent for treatment and assignment of benefits for services provided during this visit. Patient/Guardian expressed understanding and agreed to proceed.    [name], MD 03/20/2023, 10:57 AM

## 2023-03-23 ENCOUNTER — Telehealth (HOSPITAL_COMMUNITY): Payer: Medicare Other | Admitting: Psychiatry

## 2023-04-28 DIAGNOSIS — E119 Type 2 diabetes mellitus without complications: Secondary | ICD-10-CM | POA: Diagnosis not present

## 2023-04-28 DIAGNOSIS — Z961 Presence of intraocular lens: Secondary | ICD-10-CM | POA: Diagnosis not present

## 2023-04-28 DIAGNOSIS — H524 Presbyopia: Secondary | ICD-10-CM | POA: Diagnosis not present

## 2023-04-28 DIAGNOSIS — H401131 Primary open-angle glaucoma, bilateral, mild stage: Secondary | ICD-10-CM | POA: Diagnosis not present

## 2023-04-28 DIAGNOSIS — H5213 Myopia, bilateral: Secondary | ICD-10-CM | POA: Diagnosis not present

## 2023-04-29 DIAGNOSIS — I10 Essential (primary) hypertension: Secondary | ICD-10-CM | POA: Diagnosis not present

## 2023-04-29 DIAGNOSIS — W19XXXA Unspecified fall, initial encounter: Secondary | ICD-10-CM | POA: Diagnosis not present

## 2023-05-01 DIAGNOSIS — I10 Essential (primary) hypertension: Secondary | ICD-10-CM | POA: Diagnosis not present

## 2023-05-01 DIAGNOSIS — N390 Urinary tract infection, site not specified: Secondary | ICD-10-CM | POA: Diagnosis not present

## 2023-05-01 DIAGNOSIS — Z299 Encounter for prophylactic measures, unspecified: Secondary | ICD-10-CM | POA: Diagnosis not present

## 2023-05-01 DIAGNOSIS — Z6836 Body mass index (BMI) 36.0-36.9, adult: Secondary | ICD-10-CM | POA: Diagnosis not present

## 2023-05-20 DIAGNOSIS — R350 Frequency of micturition: Secondary | ICD-10-CM | POA: Diagnosis not present

## 2023-05-20 DIAGNOSIS — N390 Urinary tract infection, site not specified: Secondary | ICD-10-CM | POA: Diagnosis not present

## 2023-05-20 DIAGNOSIS — I10 Essential (primary) hypertension: Secondary | ICD-10-CM | POA: Diagnosis not present

## 2023-05-20 DIAGNOSIS — Z299 Encounter for prophylactic measures, unspecified: Secondary | ICD-10-CM | POA: Diagnosis not present

## 2023-05-29 DIAGNOSIS — I152 Hypertension secondary to endocrine disorders: Secondary | ICD-10-CM | POA: Insufficient documentation

## 2023-05-29 DIAGNOSIS — E1165 Type 2 diabetes mellitus with hyperglycemia: Secondary | ICD-10-CM | POA: Insufficient documentation

## 2023-05-29 DIAGNOSIS — E1169 Type 2 diabetes mellitus with other specified complication: Secondary | ICD-10-CM | POA: Insufficient documentation

## 2023-05-29 DIAGNOSIS — I4891 Unspecified atrial fibrillation: Secondary | ICD-10-CM | POA: Insufficient documentation

## 2023-05-29 DIAGNOSIS — K219 Gastro-esophageal reflux disease without esophagitis: Secondary | ICD-10-CM | POA: Insufficient documentation

## 2023-05-29 NOTE — Progress Notes (Unsigned)
History of Present Illness: Yesenia Reynolds is a 82 y.o. female who presents today as a new patient at [facility]. All available relevant medical records have been reviewed.  - GU / GYN History: 1. Endometrial cancer. - 03/30/2015: Underwent robotic-assisted laparoscopic hysterectomy (uterus removed via mini-lap) with BSO.  She reports chief complaint of ***recurrent UTls.   Urine culture results in past 12 months: ***as of 05/29/23 no scanned records or other labs in chart / care everywhere  Urinary Symptoms: She reports *** UTl's in the last year. When present, UTI symptoms include ***dysuria, ***increased urinary urgency, ***frequency, ***. She reports that UTI symptoms do ***not seem to correlate with intercourse. She {Actions; denies-reports:120008} acute UTI symptoms today.  At baseline: She {Actions; denies-reports:120008} urinary urgency, frequency, dysuria, gross hematuria, hesitancy, straining to void, or sensations of incomplete emptying. She reports voiding *** times per day and *** times at night. She {Actions; denies-reports:120008} pushing on a bulge in order to empty her bladder.  She {Actions; denies-reports:120008} urge incontinence. She {Actions; denies-reports:120008} stress incontinence with ***cough/***laugh/***sneeze/***heavy lifting/***exercise. She {Actions; denies-reports:120008} enuresis. She leaks *** times per ***. Wears *** ***pads / ***diapers per day. She states the ***SUI / ***UUI is predominant. This has been going on for {NUMBERS 1-12:18279} {days/wks/mos/yrs:310907} and {ACTION; IS/IS VHQ:46962952} significantly bothersome. In terms of treatment, She has tried ***.  She {Actions; denies-reports:120008} caffeine intake.  She {Actions; denies-reports:120008} history of pyelonephritis.  She {Actions; denies-reports:120008} history of kidney stones.  Vaginal / prolapse Symptoms: She {Actions; denies-reports:120008} vaginal bulge  sensation.  She {Actions; denies-reports:120008} seeing a vaginal bulge. This bulge is bothersome and is the size of ***. It was first noticed ***.  She {Actions; denies-reports:120008} vaginal pain, bleeding, or discharge.  She {Actions; denies-reports:120008} use of topical vaginal estrogen cream.  Bowel Symptoms: She has a continent bowel movement *** time(s) per ***. Typical Bristol stool scale of continent bowel episodes is Type ***. She {Actions; denies-reports:120008} Fl episodes. Fecal incontinence started *** and occurs *** times per week. Typical Bristol stool scale of incontinent bowel episodes is Type ***. She {Actions; denies-reports:120008} straining and {Actions; denies-reports:120008} splinting to defecate. She {Actions; denies-reports:120008} rectal bleeding. She {Actions; denies-reports:120008} feeling like she fully empties her rectum with defecation. She {Actions; denies-reports:120008} urgency with defecation. She {Actions; denies-reports:120008} difficulty wiping clean. She {Actions; denies-reports:120008} taking laxatives, stool softeners, or fiber supplements. Last colonoscopy was***.  Past OB/GYN History: OB History   No obstetric history on file.    She {Actions; denies-reports:120008} being sexually active.  She {Actions; denies-reports:120008} dyspareunia. ***Dyspareunia is located ***at the introitus / ***deep inside the vagina. She has *** child(ren) who were delivered ***vaginally ***via c-section. She {Actions; denies-reports:120008} significant tearing with that ***delivery / those ***deliveries. She is {DESC; PRE/POST:32304} menopausal.  Last pap smear was ***. She {Actions; denies-reports:120008} history of *** hysterectomy.  Fall Screening: Do you usually have a device to assist in your mobility? {yes/no:20286} ***cane / ***walker / ***wheelchair   Medications: Current Outpatient Medications  Medication Sig Dispense Refill   acetaminophen (TYLENOL 8  HOUR ARTHRITIS PAIN) 650 MG CR tablet Take 650 mg by mouth every 8 (eight) hours as needed for pain.     ALPRAZolam (XANAX) 0.25 MG tablet Take 1 tablet (0.25 mg total) by mouth 4 (four) times daily. 120 tablet 3   alum & mag hydroxide-simeth (MAALOX/MYLANTA) 200-200-20 MG/5ML suspension Take 30 mLs by mouth every 6 (six) hours as needed for indigestion or heartburn.     aspirin 81  MG tablet Take 81 mg by mouth at bedtime.      atorvastatin (LIPITOR) 10 MG tablet Take 10 mg by mouth every evening.      busPIRone (BUSPAR) 15 MG tablet Take 1 tablet (15 mg total) by mouth 2 (two) times daily. 60 tablet 3   calcium carbonate (TUMS - DOSED IN MG ELEMENTAL CALCIUM) 500 MG chewable tablet Chew 1 tablet by mouth daily as needed for indigestion or heartburn.     ibuprofen (ADVIL,MOTRIN) 200 MG tablet Take 200 mg by mouth 2 (two) times daily as needed for moderate pain.      latanoprost (XALATAN) 0.005 % ophthalmic solution Place 1 drop into both eyes at bedtime.     levothyroxine (SYNTHROID, LEVOTHROID) 88 MCG tablet Take 88 mcg by mouth daily before breakfast.      lisinopril (PRINIVIL,ZESTRIL) 10 MG tablet Take 10 mg by mouth every morning.      loratadine (CLARITIN) 10 MG tablet Take 10 mg by mouth daily as needed for allergies.     metFORMIN (GLUCOPHAGE) 500 MG tablet Take by mouth.     metoprolol succinate (TOPROL-XL) 25 MG 24 hr tablet take 1/2 tablet BY MOUTH every morning 3 tablet 0   metoprolol succinate (TOPROL-XL) 25 MG 24 hr tablet take 1/2 tablet BY MOUTH every morning 3 tablet 0   polyethylene glycol (MIRALAX / GLYCOLAX) packet Take 17 g by mouth daily as needed for mild constipation.      venlafaxine XR (EFFEXOR-XR) 150 MG 24 hr capsule TAKE ONE CAPSULE BY MOUTH DAILY WITH breakast 30 capsule 3   No current facility-administered medications for this visit.    Allergies: Allergies  Allergen Reactions   Morphine And Codeine Other (See Comments)    Hypotension   Other     Preservative  in latanoprost causes redness, pt uses preservative free latanoprost    Paxil [Paroxetine Hcl]     unknown   Latex Rash    Past Medical History:  Diagnosis Date   Agoraphobia with panic attacks 08/07/2016   Anxiety    Arthritis    Atrial fibrillation, currently in sinus rhythm    Cancer (HCC)    Endometrial   Depression    Endometrial cancer, FIGO stage IIIC (HCC) 08/08/2015   Essential hypertension, benign    GERD (gastroesophageal reflux disease)    Hypothyroidism    Mixed hyperlipidemia    Palpitations    Spinal stenosis    Spinal stenosis of lumbar region at multiple levels 08/07/2016   Type 2 diabetes mellitus [facility])    Past Surgical History:  Procedure Laterality Date   ABDOMINAL HYSTERECTOMY     BREAST BIOPSY     benign   CATARACT EXTRACTION W/PHACO Left 03/31/2017   Procedure: CATARACT EXTRACTION PHACO AND INTRAOCULAR LENS PLACEMENT (IOC);  Surgeon: Jethro Bolus, MD;  Location: [facility];  Service: Ophthalmology;  Laterality: Left;  CDE: 8.67   CATARACT EXTRACTION W/PHACO Right 04/14/2017   Procedure: CATARACT EXTRACTION PHACO AND INTRAOCULAR LENS PLACEMENT RIGHT EYE;  Surgeon: Jethro Bolus, MD;  Location: [facility];  Service: Ophthalmology;  Laterality: Right;  CDE: 8.52   LIPOMA EXCISION     Multiple dental extractions     PORT-A-CATH REMOVAL Right 09/02/2017   Procedure: MINOR REMOVAL PORT-A-CATH;  Surgeon: Franky Macho, MD;  Location: [facility];  Service: General;  Laterality: Right;   TONSILLECTOMY AND ADENOIDECTOMY     TOTAL KNEE ARTHROPLASTY  08/04/2012   Procedure: TOTAL KNEE ARTHROPLASTY;  Surgeon: Loanne Drilling,  MD;  Location: [facility];  Service: Orthopedics;  Laterality: Left;   Family History  Problem Relation Age of Onset   Stroke Mother    Heart failure Mother    Hypertension Father    Coronary artery disease Father    Social History   Socioeconomic History   Marital status: Single    Spouse name: Not on file   Number of children: Not on file   Years of  education: Not on file   Highest education level: Not on file  Occupational History   Not on file  Tobacco Use   Smoking status: Never   Smokeless tobacco: Never  Substance and Sexual Activity   Alcohol use: No    Comment: 07/10/2016 per pt no   Drug use: No    Comment: 8/3/2017per pt no    Sexual activity: Never  Other Topics Concern   Not on file  Social History Narrative   Not on file   Social Determinants of Health   Financial Resource Strain: Not on file  Food Insecurity: Not on file  Transportation Needs: Not on file  Physical Activity: Not on file  Stress: Not on file  Social Connections: Not on file  Intimate Partner Violence: Not on file    SUBJECTIVE  Review of Systems Constitutional: Patient ***denies any unintentional weight loss or change in strength lntegumentary: Patient ***denies any rashes or pruritus Eyes: Patient denies ***dry eyes ENT: Patient ***denies dry mouth Cardiovascular: Patient ***denies chest pain or syncope Respiratory: Patient ***denies shortness of breath Gastrointestinal: Patient ***denies nausea, vomiting, constipation, or diarrhea Musculoskeletal: Patient ***denies muscle cramps or weakness Neurologic: Patient ***denies convulsions or seizures Psychiatric: Patient ***denies memory problems Allergic/Immunologic: Patient ***denies recent allergic reaction(s) Hematologic/Lymphatic: Patient denies bleeding tendencies Endocrine: Patient ***denies heat/cold intolerance  GU: As per HPI.  OBJECTIVE There were no vitals filed for this visit. There is no height or weight on file to calculate BMI.  Physical Examination  Constitutional: General appearance: Well nourished, well developed in no acute distress. Psych: Normal mood and affect, alert and oriented x 3 Neck: Supple, normal appearance.  Respiratory: Normal respiratory effort.  Cardiovascular: No lower extremity edema.  Skin: Warm and dry, no lesions. Lymphatic: No inguinal  lymphadenopathy. Abdomen: No masses. No abdominal tenderness. No hernias. No hepatosplenomegaly. No guarding or rebound. Surgical scars ***Pfannensteil / ***laparoscopic port sites/***.  Detailed Urogynecologic Evaluation: Pelvic Exam: External genitalia  {desc; normal/abnormal:32111} in appearance and {Desc; negative/positive:13464} for erythema, lesions or discharge. Bartholin's and Skene's glands  in appearance. She has  sensation to light touch. ***She has an {Desc; intact/impaired:30302} anal wink. There {ACTION; IS/IS NOT:21021397} stool contamination of the perineum noted. Urethral meatus midline and {Desc; negative/positive:13464}  for discharge, polyps, prolapse, caruncles, masses, or erythema. Urethra {Desc; negative/positive:13464}  for tenderness, masses, hypermobility, or leak with cough.  Bladder {Desc; negative/positive:13464}  for tenderness or distention. Vagina {Desc; negative/positive:13464}  for discharge, irritation or lesions. ***Atrophic. ***No prolapse. ***Levator diastasis. Uterus {Desc; negative/positive:13464}  for masses, enlargement or tenderness. ****Surgically absent.  Adnexa {Desc; negative/positive:13464}  for masses or tenderness.  Pelvic floor {ACTION; IS/IS MVH:84696295} hypertonic. Pelvic floor {ACTION; IS/IS NOT:21021397} tender to palpation.  POP-Q EXAM  Aa: ***  Ba: ***  Ap: ***  Bp: ***  C: ***  D: ***  Gh: ***  Pb: ***  TVL: ***  Staging  ***    Rectal Exam: Anal verge looks {desc; normal/abnormal:32111} {With/without:5700} hemorrhoids. Resting anal sphincter tone appears {desc; normal/abnormal:32111}. Squeeze sphincter tone appears {desc;  normal/abnormal:32111}.  Enterocele / distal rectocele {DESC; PRESENT/ABSENT:17923}. Rectal masses {DESC; PRESENT/ABSENT:17923}. Dyssynergia {DESC; PRESENT/ABSENT:17923} when asking the patient to bear down.   Pelvic exam was chaperoned by ***.  UA: {Desc; negative/positive:13464} *** WBC/hpf, ***  RBC/hpf, bacteria (***) *** nitrites, *** leukocytes, *** blood PVR: *** ml  ASSESSMENT No diagnosis found.  ***Recurrent UTls / ***History of UTls with insufficient urine culture data to formally validate recurrent UTI diagnosis: *** 2. ***Vaginal atrophy: ***  Will plan for follow up in *** months or sooner if needed. Pt verbalized understanding and agreement. All questions were answered.  PLAN Advised the following: 1. *** 2. ***No follow-ups on file.  No orders of the defined types were placed in this encounter.   It has been explained that the patient is to follow regularly with their PCP in addition to all other providers involved in their care and to follow instructions provided by these respective offices. Patient advised to contact urology clinic if any urologic-pertaining questions, concerns, new symptoms or problems arise in the interim period.  There are no Patient Instructions on file for this visit.  Electronically signed by:  Donnita Falls, MSN, FNP-C, CUNP 05/29/2023 1:00 PM

## 2023-06-02 ENCOUNTER — Telehealth: Payer: Self-pay

## 2023-06-02 ENCOUNTER — Encounter: Payer: Self-pay | Admitting: Urology

## 2023-06-02 ENCOUNTER — Other Ambulatory Visit: Payer: Self-pay | Admitting: Urology

## 2023-06-02 ENCOUNTER — Ambulatory Visit: Payer: Medicare HMO | Admitting: Urology

## 2023-06-02 VITALS — BP 128/79 | HR 106 | Temp 99.1°F

## 2023-06-02 DIAGNOSIS — Z8542 Personal history of malignant neoplasm of other parts of uterus: Secondary | ICD-10-CM

## 2023-06-02 DIAGNOSIS — N3946 Mixed incontinence: Secondary | ICD-10-CM

## 2023-06-02 DIAGNOSIS — Z87440 Personal history of urinary (tract) infections: Secondary | ICD-10-CM | POA: Diagnosis not present

## 2023-06-02 DIAGNOSIS — R3911 Hesitancy of micturition: Secondary | ICD-10-CM

## 2023-06-02 DIAGNOSIS — N398 Other specified disorders of urinary system: Secondary | ICD-10-CM

## 2023-06-02 DIAGNOSIS — R39198 Other difficulties with micturition: Secondary | ICD-10-CM

## 2023-06-02 DIAGNOSIS — R3915 Urgency of urination: Secondary | ICD-10-CM

## 2023-06-02 DIAGNOSIS — R319 Hematuria, unspecified: Secondary | ICD-10-CM | POA: Diagnosis not present

## 2023-06-02 DIAGNOSIS — N393 Stress incontinence (female) (male): Secondary | ICD-10-CM | POA: Insufficient documentation

## 2023-06-02 DIAGNOSIS — R350 Frequency of micturition: Secondary | ICD-10-CM | POA: Diagnosis not present

## 2023-06-02 DIAGNOSIS — N3941 Urge incontinence: Secondary | ICD-10-CM | POA: Insufficient documentation

## 2023-06-02 DIAGNOSIS — R399 Unspecified symptoms and signs involving the genitourinary system: Secondary | ICD-10-CM

## 2023-06-02 DIAGNOSIS — R8290 Unspecified abnormal findings in urine: Secondary | ICD-10-CM

## 2023-06-02 LAB — URINALYSIS, ROUTINE W REFLEX MICROSCOPIC
Bilirubin, UA: NEGATIVE
Glucose, UA: NEGATIVE
Ketones, UA: NEGATIVE
Nitrite, UA: NEGATIVE
Specific Gravity, UA: 1.01 (ref 1.005–1.030)
Urobilinogen, Ur: 0.2 mg/dL (ref 0.2–1.0)
pH, UA: 7.5 (ref 5.0–7.5)

## 2023-06-02 LAB — MICROSCOPIC EXAMINATION: RBC, Urine: >30 /hpf — AB (ref 0–2)

## 2023-06-02 MED ORDER — NITROFURANTOIN MONOHYD MACRO 100 MG PO CAPS
100.0000 mg | ORAL_CAPSULE | Freq: Two times a day (BID) | ORAL | 0 refills | Status: DC
Start: 2023-06-02 — End: 2023-10-13

## 2023-06-02 NOTE — Telephone Encounter (Signed)
Patient called and wanted to know if antibiotic would be called in prior to the culture results coming back.    Thank you

## 2023-06-02 NOTE — Progress Notes (Signed)
.  In and Out Catheterization  Patient is present today for a I & O catheterization due to urinary incontinence. Patient was cleaned and prepped in a sterile fashion with betadine . A 14 FR cath was inserted no complications were noted , 20 ml of urine return was noted, urine was dark yellow in color. A clean urine sample was collected for urinalysis and culture. Bladder was drained  And catheter was removed with out difficulty.    Performed by: [place] Pink RMA  Follow up/ Additional notes: F/UP as scheduled

## 2023-06-02 NOTE — Addendum Note (Signed)
Addended by: Tilden Dome on: 06/02/2023 10:40 AM   Modules accepted: Orders

## 2023-06-02 NOTE — Patient Instructions (Signed)
Recommendations regarding UTI prevention / management:  When UTI symptoms occur: Call urology office to request order for urine culture. We recommend waiting for urine culture result prior to use of any antibiotics.   For bladder pain/ burning with urination: Over the counter Pyridium (phenazopyridine) as needed (commonly known under the "AZO" brand). No more than 3 days consecutively at a time due to risk for methemoglobinemia, liver function issues, and bone health damage with long term use of Pyridium.  Routine use for UTI prevention: - Topical vaginal estrogen for vaginal atrophy. Adequate fluid intake {>1.5 liters/day) to flush out the urinary tract. - Go to the bathroom to urinate every 4-6 hours while awake to minimize urinary stasis / bacterial overgrowth in the bladder. - Proanthocyanidin (PAC) supplement 36 mg daily; must be soluble (insoluble form of PAC will be ineffective). Recommended brand: Ellura. This is an over-the-counter supplement (often must be found/ purchased online) supplement derived from cranberries with concentrated active component: Proanthocyanidin (PAC) 36 mg daily. Decreases bacterial adherence to bladder lining. Not recommended for patients with interstitial cystitis due to acidity. - Vitamin C supplement to acidify urine to minimize bacterial growth. Not recommended for patients with interstitial cystitis due to acidity. - Probiotic to maintain healthy vaginal microbiome to suppress bacteria at urethral opening. Brand recommendations: Feminine Balance (highest concentration of lactobacillus) or Hyperbiotic Pro 15.  Note for patients with diabetes: You may read about D-mannose powder for UTI prevention. That is an over the-counter supplement which decreases bacterial adherence to bladder lining. I would NOT advise that for you as a person with diabetes due to its sugar content.  

## 2023-06-09 ENCOUNTER — Telehealth: Payer: Self-pay

## 2023-06-09 NOTE — Telephone Encounter (Signed)
Patient needing results of urine culture.  Please advise.

## 2023-06-09 NOTE — Telephone Encounter (Signed)
Tried calling patient with no answer and unable to leave vm due to mailbox full.

## 2023-06-16 NOTE — Progress Notes (Deleted)
Name: Yesenia Reynolds DOB: 24-Oct-1941 MRN: [id number]  History of Present Illness: Yesenia Reynolds is a 82 y.o. female who presents today for follow up visit at [facility] [place].  At initial visit on 06/02/2023: 1. History of repeat UTl symptoms with insufficient urine culture data to formally validate recurrent UTI diagnosis:  - Urine culture planned but unfortunately not done that day; unclear why. Treated empirically with Macrobid.  - We discussed option to start topical vaginal estrogen cream for UTI prevention due to suspected vaginal atrophy. Agreed to hold off pending further urine culture data collection; she may need clearance from her GYN oncologist prior to starting topical vaginal estrogen cream in the future due to endometrial cancer history.  2. Possible hematuria versus vaginal irritation secondary to atrophy.  - Patient reported seeing "drops of fresh blood" in the toilet bowl from her urine at least once per day daily for the past 2 weeks.  - Denies history of kidney stones.  - If urine culture negative, may advise hematuria work up with CT and cystoscopy for further evaluation. 3. Voiding dysfunction with urinary hesitancy:  - Normal PVR (20 ml).  - Discussed option to consider pelvic floor physical therapy is voiding symptoms worsen. May warrant urodynamic testing in the future.  - If unable to provide voided urine specimen when acutely symptomatic, will need nurses to obtain I&O catheterized urine specimen for urine culture. 4. Stress urinary incontinence:  - She elected to proceed with expectant management. 5. Urge urinary incontinence:  - Not significantly bothersome per patient.  - She is not a safe candidate for anticholinergic OAB medications due to age and glaucoma.  Since last visit: ***  Today: She reports ***  She {Actions; denies-reports:120008} increased urinary urgency, frequency, dysuria, gross hematuria, straining to void, or sensations of  incomplete emptying.   Fall Screening: Do you usually have a device to assist in your mobility? {yes/no:20286} ***cane / ***walker / ***wheelchair   Medications: Current Outpatient Medications  Medication Sig Dispense Refill   acetaminophen (TYLENOL 8 HOUR ARTHRITIS PAIN) 650 MG CR tablet Take 650 mg by mouth every 8 (eight) hours as needed for pain.     ALPRAZolam (XANAX) 0.25 MG tablet Take 1 tablet (0.25 mg total) by mouth 4 (four) times daily. 120 tablet 3   alum & mag hydroxide-simeth (MAALOX/MYLANTA) 200-200-20 MG/5ML suspension Take 30 mLs by mouth every 6 (six) hours as needed for indigestion or heartburn.     aspirin 81 MG tablet Take 81 mg by mouth at bedtime.      atorvastatin (LIPITOR) 10 MG tablet Take 10 mg by mouth every evening.      busPIRone (BUSPAR) 15 MG tablet Take 1 tablet (15 mg total) by mouth 2 (two) times daily. 60 tablet 3   calcium carbonate (TUMS - DOSED IN MG ELEMENTAL CALCIUM) 500 MG chewable tablet Chew 1 tablet by mouth daily as needed for indigestion or heartburn.     ibuprofen (ADVIL,MOTRIN) 200 MG tablet Take 200 mg by mouth 2 (two) times daily as needed for moderate pain.      latanoprost (XALATAN) 0.005 % ophthalmic solution Place 1 drop into both eyes at bedtime.     levothyroxine (SYNTHROID, LEVOTHROID) 88 MCG tablet Take 88 mcg by mouth daily before breakfast.      lisinopril (PRINIVIL,ZESTRIL) 10 MG tablet Take 10 mg by mouth every morning.      loratadine (CLARITIN) 10 MG tablet Take 10 mg by mouth daily as needed  for allergies.     metFORMIN (GLUCOPHAGE) 500 MG tablet Take by mouth.     metoprolol succinate (TOPROL-XL) 25 MG 24 hr tablet take 1/2 tablet BY MOUTH every morning 3 tablet 0   metoprolol succinate (TOPROL-XL) 25 MG 24 hr tablet take 1/2 tablet BY MOUTH every morning 3 tablet 0   nitrofurantoin, macrocrystal-monohydrate, (MACROBID) 100 MG capsule Take 1 capsule (100 mg total) by mouth every 12 (twelve) hours. 14 capsule 0   polyethylene  glycol (MIRALAX / GLYCOLAX) packet Take 17 g by mouth daily as needed for mild constipation.      venlafaxine XR (EFFEXOR-XR) 150 MG 24 hr capsule TAKE ONE CAPSULE BY MOUTH DAILY WITH breakast 30 capsule 3   No current facility-administered medications for this visit.    Allergies: Allergies  Allergen Reactions   Morphine And Codeine Other (See Comments)    Hypotension   Other     Preservative in latanoprost causes redness, pt uses preservative free latanoprost    Paxil [Paroxetine Hcl]     unknown   Latex Rash    Past Medical History:  Diagnosis Date   Agoraphobia with panic attacks 08/07/2016   Anxiety    Arthritis    Atrial fibrillation, currently in sinus rhythm    Cancer (HCC)    Endometrial   Depression    Endometrial cancer, FIGO stage IIIC (HCC) 08/08/2015   Essential hypertension, benign    GERD (gastroesophageal reflux disease)    Glaucoma    Hypothyroidism    Mixed hyperlipidemia    Palpitations    Spinal stenosis    Spinal stenosis of lumbar region at multiple levels 08/07/2016   Type 2 diabetes mellitus (HCC)    Uterine cancer [facility])    Past Surgical History:  Procedure Laterality Date   ABDOMINAL HYSTERECTOMY     BREAST BIOPSY     benign   CATARACT EXTRACTION W/PHACO Left 03/31/2017   Procedure: CATARACT EXTRACTION PHACO AND INTRAOCULAR LENS PLACEMENT (IOC);  Surgeon: Jethro Bolus, MD;  Location: [facility];  Service: Ophthalmology;  Laterality: Left;  CDE: 8.67   CATARACT EXTRACTION W/PHACO Right 04/14/2017   Procedure: CATARACT EXTRACTION PHACO AND INTRAOCULAR LENS PLACEMENT RIGHT EYE;  Surgeon: Jethro Bolus, MD;  Location: [facility];  Service: Ophthalmology;  Laterality: Right;  CDE: 8.52   diabetes     Type 2   LIPOMA EXCISION     Multiple dental extractions     PORT-A-CATH REMOVAL Right 09/02/2017   Procedure: MINOR REMOVAL PORT-A-CATH;  Surgeon: Franky Macho, MD;  Location: [facility];  Service: General;  Laterality: Right;   REPLACEMENT TOTAL KNEE      TONSILLECTOMY AND ADENOIDECTOMY     TOTAL KNEE ARTHROPLASTY  08/04/2012   Procedure: TOTAL KNEE ARTHROPLASTY;  Surgeon: Loanne Drilling, MD;  Location: [facility];  Service: Orthopedics;  Laterality: Left;   Family History  Problem Relation Age of Onset   Stroke Mother    Heart failure Mother    Hypertension Father    Coronary artery disease Father    Social History   Socioeconomic History   Marital status: Single    Spouse name: Not on file   Number of children: Not on file   Years of education: Not on file   Highest education level: Not on file  Occupational History   Not on file  Tobacco Use   Smoking status: Never   Smokeless tobacco: Never  Substance and Sexual Activity   Alcohol use: No  Comment: 07/10/2016 per pt no   Drug use: No    Comment: 8/3/2017per pt no    Sexual activity: Never  Other Topics Concern   Not on file  Social History Narrative   Not on file   Social Determinants of Health   Financial Resource Strain: Not on file  Food Insecurity: Not on file  Transportation Needs: Not on file  Physical Activity: Not on file  Stress: Not on file  Social Connections: Not on file  Intimate Partner Violence: Not on file    Review of Systems Constitutional: Patient ***denies any unintentional weight loss or change in strength lntegumentary: Patient ***denies any rashes or pruritus Eyes: Patient denies ***dry eyes ENT: Patient ***denies dry mouth Cardiovascular: Patient ***denies chest pain or syncope Respiratory: Patient ***denies shortness of breath Gastrointestinal: Patient ***denies nausea, vomiting, constipation, or diarrhea Musculoskeletal: Patient ***denies muscle cramps or weakness Neurologic: Patient ***denies convulsions or seizures Psychiatric: Patient ***denies memory problems Allergic/Immunologic: Patient ***denies recent allergic reaction(s) Hematologic/Lymphatic: Patient denies bleeding tendencies Endocrine: Patient ***denies heat/cold  intolerance  GU: As per HPI.  OBJECTIVE There were no vitals filed for this visit. There is no height or weight on file to calculate BMI.  Physical Examination  Constitutional: ***No obvious distress; patient is ***non-toxic appearing  Cardiovascular: ***No visible lower extremity edema.  Respiratory: The patient does ***not have audible wheezing/stridor; respirations do ***not appear labored  Gastrointestinal: Abdomen ***non-distended Musculoskeletal: ***Normal ROM of UEs  Skin: ***No obvious rashes/open sores  Neurologic: CN 2-12 grossly ***intact Psychiatric: Answered questions ***appropriately with ***normal affect  Hematologic/Lymphatic/Immunologic: ***No obvious bruises or sites of spontaneous bleeding  UA: {Desc; negative/positive:13464} for *** WBC/hpf, *** RBC/hpf, bacteria (***) *** nitrites, *** leukocytes, *** blood PVR: *** ml  ASSESSMENT No diagnosis found. ***  Will plan for follow up in *** months / ***1 year or sooner if needed. Pt verbalized understanding and agreement. All questions were answered.  PLAN Advised the following: 1. *** 2. ***No follow-ups on file.  No orders of the defined types were placed in this encounter.   It has been explained that the patient is to follow regularly with their PCP in addition to all other providers involved in their care and to follow instructions provided by these respective offices. Patient advised to contact urology clinic if any urologic-pertaining questions, concerns, new symptoms or problems arise in the interim period.  There are no Patient Instructions on file for this visit.  Electronically signed by:  Donnita Falls, FNP   06/16/23    5:29 PM

## 2023-06-17 DIAGNOSIS — I152 Hypertension secondary to endocrine disorders: Secondary | ICD-10-CM | POA: Diagnosis not present

## 2023-06-17 DIAGNOSIS — B370 Candidal stomatitis: Secondary | ICD-10-CM | POA: Diagnosis not present

## 2023-06-17 DIAGNOSIS — I10 Essential (primary) hypertension: Secondary | ICD-10-CM | POA: Diagnosis not present

## 2023-06-17 DIAGNOSIS — E1159 Type 2 diabetes mellitus with other circulatory complications: Secondary | ICD-10-CM | POA: Diagnosis not present

## 2023-06-17 DIAGNOSIS — Z299 Encounter for prophylactic measures, unspecified: Secondary | ICD-10-CM | POA: Diagnosis not present

## 2023-06-18 ENCOUNTER — Ambulatory Visit: Payer: Medicare HMO | Admitting: Urology

## 2023-06-19 ENCOUNTER — Ambulatory Visit: Payer: Medicare HMO | Admitting: Urology

## 2023-06-22 NOTE — Progress Notes (Unsigned)
Name: Yesenia Reynolds DOB: September 03, 1941 MRN: [id number]  History of Present Illness: Yesenia Reynolds is a 83 y.o. female who presents today for follow up visit at [facility] [place].  At initial visit on 06/02/2023: 1. History of repeat UTl symptoms with insufficient urine culture data to formally validate recurrent UTI diagnosis:  - Urine culture planned but unfortunately not done that day; unclear why. Treated empirically with Macrobid.  - We discussed option to start topical vaginal estrogen cream for UTI prevention due to suspected vaginal atrophy. Agreed to hold off pending further urine culture data collection; she may need clearance from her GYN oncologist prior to starting topical vaginal estrogen cream in the future due to endometrial cancer history.  2. Possible hematuria versus vaginal irritation secondary to atrophy.  - Patient reported seeing "drops of fresh blood" in the toilet bowl from her urine at least once per day daily for the past 2 weeks.  - Denies history of kidney stones.  - If urine culture negative, may advise hematuria work up with CT and cystoscopy for further evaluation. 3. Voiding dysfunction with urinary hesitancy:  - Normal PVR (20 ml).  - Discussed option to consider pelvic floor physical therapy is voiding symptoms worsen. May warrant urodynamic testing in the future.  - If unable to provide voided urine specimen when acutely symptomatic, will need nurses to obtain I&O catheterized urine specimen for urine culture. 4. Stress urinary incontinence:  - She elected to proceed with expectant management. 5. Urge urinary incontinence:  - Not significantly bothersome per patient.  - She is not a safe candidate for anticholinergic OAB medications due to age and glaucoma.  Since last visit: ***  Today: She reports ***  She {Actions; denies-reports:120008} increased urinary urgency, frequency, dysuria, gross hematuria, straining to void, or sensations of  incomplete emptying.   Medications: Current Outpatient Medications  Medication Sig Dispense Refill   acetaminophen (TYLENOL 8 HOUR ARTHRITIS PAIN) 650 MG CR tablet Take 650 mg by mouth every 8 (eight) hours as needed for pain.     ALPRAZolam (XANAX) 0.25 MG tablet Take 1 tablet (0.25 mg total) by mouth 4 (four) times daily. 120 tablet 3   alum & mag hydroxide-simeth (MAALOX/MYLANTA) 200-200-20 MG/5ML suspension Take 30 mLs by mouth every 6 (six) hours as needed for indigestion or heartburn.     aspirin 81 MG tablet Take 81 mg by mouth at bedtime.      atorvastatin (LIPITOR) 10 MG tablet Take 10 mg by mouth every evening.      busPIRone (BUSPAR) 15 MG tablet Take 1 tablet (15 mg total) by mouth 2 (two) times daily. 60 tablet 3   calcium carbonate (TUMS - DOSED IN MG ELEMENTAL CALCIUM) 500 MG chewable tablet Chew 1 tablet by mouth daily as needed for indigestion or heartburn.     ibuprofen (ADVIL,MOTRIN) 200 MG tablet Take 200 mg by mouth 2 (two) times daily as needed for moderate pain.      latanoprost (XALATAN) 0.005 % ophthalmic solution Place 1 drop into both eyes at bedtime.     levothyroxine (SYNTHROID, LEVOTHROID) 88 MCG tablet Take 88 mcg by mouth daily before breakfast.      lisinopril (PRINIVIL,ZESTRIL) 10 MG tablet Take 10 mg by mouth every morning.      loratadine (CLARITIN) 10 MG tablet Take 10 mg by mouth daily as needed for allergies.     metFORMIN (GLUCOPHAGE) 500 MG tablet Take by mouth.     metoprolol succinate (TOPROL-XL)  25 MG 24 hr tablet take 1/2 tablet BY MOUTH every morning 3 tablet 0   metoprolol succinate (TOPROL-XL) 25 MG 24 hr tablet take 1/2 tablet BY MOUTH every morning 3 tablet 0   nitrofurantoin, macrocrystal-monohydrate, (MACROBID) 100 MG capsule Take 1 capsule (100 mg total) by mouth every 12 (twelve) hours. 14 capsule 0   polyethylene glycol (MIRALAX / GLYCOLAX) packet Take 17 g by mouth daily as needed for mild constipation.      venlafaxine XR (EFFEXOR-XR)  150 MG 24 hr capsule TAKE ONE CAPSULE BY MOUTH DAILY WITH breakast 30 capsule 3   No current facility-administered medications for this visit.    Allergies: Allergies  Allergen Reactions   Morphine And Codeine Other (See Comments)    Hypotension   Other     Preservative in latanoprost causes redness, pt uses preservative free latanoprost    Paxil [Paroxetine Hcl]     unknown   Latex Rash    Past Medical History:  Diagnosis Date   Agoraphobia with panic attacks 08/07/2016   Anxiety    Arthritis    Atrial fibrillation, currently in sinus rhythm    Cancer (HCC)    Endometrial   Depression    Endometrial cancer, FIGO stage IIIC (HCC) 08/08/2015   Essential hypertension, benign    GERD (gastroesophageal reflux disease)    Glaucoma    Hypothyroidism    Mixed hyperlipidemia    Palpitations    Spinal stenosis    Spinal stenosis of lumbar region at multiple levels 08/07/2016   Type 2 diabetes mellitus (HCC)    Uterine cancer [facility])    Past Surgical History:  Procedure Laterality Date   ABDOMINAL HYSTERECTOMY     BREAST BIOPSY     benign   CATARACT EXTRACTION W/PHACO Left 03/31/2017   Procedure: CATARACT EXTRACTION PHACO AND INTRAOCULAR LENS PLACEMENT (IOC);  Surgeon: Jethro Bolus, MD;  Location: [facility];  Service: Ophthalmology;  Laterality: Left;  CDE: 8.67   CATARACT EXTRACTION W/PHACO Right 04/14/2017   Procedure: CATARACT EXTRACTION PHACO AND INTRAOCULAR LENS PLACEMENT RIGHT EYE;  Surgeon: Jethro Bolus, MD;  Location: [facility];  Service: Ophthalmology;  Laterality: Right;  CDE: 8.52   diabetes     Type 2   LIPOMA EXCISION     Multiple dental extractions     PORT-A-CATH REMOVAL Right 09/02/2017   Procedure: MINOR REMOVAL PORT-A-CATH;  Surgeon: Franky Macho, MD;  Location: [facility];  Service: General;  Laterality: Right;   REPLACEMENT TOTAL KNEE     TONSILLECTOMY AND ADENOIDECTOMY     TOTAL KNEE ARTHROPLASTY  08/04/2012   Procedure: TOTAL KNEE ARTHROPLASTY;  Surgeon:  Loanne Drilling, MD;  Location: [facility];  Service: Orthopedics;  Laterality: Left;   Family History  Problem Relation Age of Onset   Stroke Mother    Heart failure Mother    Hypertension Father    Coronary artery disease Father    Social History   Socioeconomic History   Marital status: Single    Spouse name: Not on file   Number of children: Not on file   Years of education: Not on file   Highest education level: Not on file  Occupational History   Not on file  Tobacco Use   Smoking status: Never   Smokeless tobacco: Never  Substance and Sexual Activity   Alcohol use: No    Comment: 07/10/2016 per pt no   Drug use: No    Comment: 8/3/2017per pt no    Sexual  activity: Never  Other Topics Concern   Not on file  Social History Narrative   Not on file   Social Determinants of Health   Financial Resource Strain: Not on file  Food Insecurity: Not on file  Transportation Needs: Not on file  Physical Activity: Not on file  Stress: Not on file  Social Connections: Not on file  Intimate Partner Violence: Not on file    Review of Systems Constitutional: Patient ***denies any unintentional weight loss or change in strength lntegumentary: Patient ***denies any rashes or pruritus Eyes: Patient denies ***dry eyes ENT: Patient ***denies dry mouth Cardiovascular: Patient ***denies chest pain or syncope Respiratory: Patient ***denies shortness of breath Gastrointestinal: Patient ***denies nausea, vomiting, constipation, or diarrhea Musculoskeletal: Patient ***denies muscle cramps or weakness Neurologic: Patient ***denies convulsions or seizures Psychiatric: Patient ***denies memory problems Allergic/Immunologic: Patient ***denies recent allergic reaction(s) Hematologic/Lymphatic: Patient denies bleeding tendencies Endocrine: Patient ***denies heat/cold intolerance  GU: As per HPI.  OBJECTIVE There were no vitals filed for this visit. There is no height or weight on file to  calculate BMI.  Physical Examination  Constitutional: ***No obvious distress; patient is ***non-toxic appearing  Cardiovascular: ***No visible lower extremity edema.  Respiratory: The patient does ***not have audible wheezing/stridor; respirations do ***not appear labored  Gastrointestinal: Abdomen ***non-distended Musculoskeletal: ***Normal ROM of UEs  Skin: ***No obvious rashes/open sores  Neurologic: CN 2-12 grossly ***intact Psychiatric: Answered questions ***appropriately with ***normal affect  Hematologic/Lymphatic/Immunologic: ***No obvious bruises or sites of spontaneous bleeding  UA: {Desc; negative/positive:13464} for *** WBC/hpf, *** RBC/hpf, bacteria (***) PVR: *** ml  ASSESSMENT No diagnosis found. ***  Will plan for follow up in *** months / ***1 year or sooner if needed. Pt verbalized understanding and agreement. All questions were answered.  PLAN Advised the following: 1. *** 2. ***No follow-ups on file.  No orders of the defined types were placed in this encounter.   It has been explained that the patient is to follow regularly with their PCP in addition to all other providers involved in their care and to follow instructions provided by these respective offices. Patient advised to contact urology clinic if any urologic-pertaining questions, concerns, new symptoms or problems arise in the interim period.  There are no Patient Instructions on file for this visit.  Electronically signed by:  Donnita Falls, FNP   06/22/23    12:51 PM

## 2023-06-22 NOTE — Telephone Encounter (Signed)
Patient called requesting to have appointment moved up to see NP. Patient states she has not felt right since her initial appointment and would like to discuss some things with the NP.   Patient made aware that a urine specimen would need to be obtained via I&O at the appointment. Patient voiced understanding. NP made aware.

## 2023-06-23 ENCOUNTER — Telehealth (HOSPITAL_COMMUNITY): Payer: Medicare HMO | Admitting: Psychiatry

## 2023-06-23 ENCOUNTER — Encounter: Payer: Self-pay | Admitting: Urology

## 2023-06-23 ENCOUNTER — Ambulatory Visit: Payer: Medicare HMO | Admitting: Urology

## 2023-06-23 ENCOUNTER — Encounter (HOSPITAL_COMMUNITY): Payer: Self-pay | Admitting: Psychiatry

## 2023-06-23 ENCOUNTER — Ambulatory Visit (HOSPITAL_COMMUNITY)
Admission: RE | Admit: 2023-06-23 | Discharge: 2023-06-23 | Disposition: A | Discharge status: Home / Self Care | Payer: Medicare HMO | Source: Ambulatory Visit | Attending: Urology | Admitting: Urology

## 2023-06-23 VITALS — BP 139/69 | HR 100 | Temp 98.9°F

## 2023-06-23 DIAGNOSIS — N3941 Urge incontinence: Secondary | ICD-10-CM | POA: Diagnosis not present

## 2023-06-23 DIAGNOSIS — Z87440 Personal history of urinary (tract) infections: Secondary | ICD-10-CM | POA: Insufficient documentation

## 2023-06-23 DIAGNOSIS — R399 Unspecified symptoms and signs involving the genitourinary system: Secondary | ICD-10-CM | POA: Diagnosis not present

## 2023-06-23 DIAGNOSIS — R3129 Other microscopic hematuria: Secondary | ICD-10-CM | POA: Insufficient documentation

## 2023-06-23 DIAGNOSIS — R82998 Other abnormal findings in urine: Secondary | ICD-10-CM

## 2023-06-23 DIAGNOSIS — F411 Generalized anxiety disorder: Secondary | ICD-10-CM

## 2023-06-23 DIAGNOSIS — N390 Urinary tract infection, site not specified: Secondary | ICD-10-CM | POA: Diagnosis not present

## 2023-06-23 DIAGNOSIS — N3946 Mixed incontinence: Secondary | ICD-10-CM

## 2023-06-23 DIAGNOSIS — R319 Hematuria, unspecified: Secondary | ICD-10-CM | POA: Diagnosis not present

## 2023-06-23 DIAGNOSIS — N393 Stress incontinence (female) (male): Secondary | ICD-10-CM | POA: Diagnosis not present

## 2023-06-23 DIAGNOSIS — N1330 Unspecified hydronephrosis: Secondary | ICD-10-CM | POA: Diagnosis not present

## 2023-06-23 LAB — URINALYSIS, ROUTINE W REFLEX MICROSCOPIC
Ketones, UA: NEGATIVE
pH, UA: 7.5 (ref 5.0–7.5)

## 2023-06-23 LAB — MICROSCOPIC EXAMINATION

## 2023-06-23 LAB — BLADDER SCAN AMB NON-IMAGING: Scan Result: 1

## 2023-06-23 MED ORDER — VENLAFAXINE HCL ER 150 MG PO CP24: ORAL_CAPSULE | ORAL | 3 refills | Status: DC

## 2023-06-23 MED ORDER — BUSPIRONE HCL 15 MG PO TABS: 15.0000 mg | ORAL_TABLET | Freq: Two times a day (BID) | ORAL | 3 refills | Status: DC

## 2023-06-23 MED ORDER — ALPRAZOLAM 0.25 MG PO TABS
0.2500 mg | ORAL_TABLET | Freq: Four times a day (QID) | ORAL | 3 refills | Status: DC
Start: 2023-06-23 — End: 2023-08-04

## 2023-06-23 NOTE — Progress Notes (Signed)
Virtual Visit via Telephone Note  I connected with Yesenia Reynolds on 06/23/23 at  9:40 AM EDT by telephone and verified that I am speaking with the correct person using two identifiers.  Location: Patient: home Provider: office   I discussed the limitations, risks, security and privacy concerns of performing an evaluation and management service by telephone and the availability of in person appointments. I also discussed with the patient that there may be a patient responsible charge related to this service. The patient expressed understanding and agreed to proceed.      I discussed the assessment and treatment plan with the patient. The patient was provided an opportunity to ask questions and all were answered. The patient agreed with the plan and demonstrated an understanding of the instructions.   The patient was advised to call back or seek an in-person evaluation if the symptoms worsen or if the condition fails to improve as anticipated.  I provided 15 minutes of non-face-to-face time during this encounter.   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  She states she is not feeling well right now she has been battling a UTI for several weeks.  She was given Cipro floxacillin in May but then the symptoms of frequency and pain with urination came back.  She was given a course of Macrodantin earlier this month but it does not seem to be helping as now she is having hematuria and frequency again.  She is frustrated with all of this.  She states that  the lab never did her urine culture and she has to go back today to do it.  She is feeling more anxious and does not like to drive by herself right now which I think is understandable.  Overall her mood has been okay but not great but she does not want to change any medicines right now since she is changing so many things with urology.  She is sleeping well and eating well and walking through the rest home to get her exercise.  She denies any thoughts of self-harm or suicide.  Her anxiety is fairly well-controlled with the current medications Visit Diagnosis:    ICD-10-CM   1. Generalized anxiety disorder  F41.1 ALPRAZolam (XANAX) 0.25 MG tablet      Past Psychiatric History: Hospitalized about 6 years ago for depression and anxiety which responded to ECT  Past Medical History:  Past Medical History:  Diagnosis Date   Agoraphobia with panic attacks 08/07/2016   Anxiety    Arthritis    Atrial fibrillation, currently in sinus rhythm    Cancer (HCC)    Endometrial   Depression    Endometrial cancer, FIGO stage IIIC (HCC) 08/08/2015   Essential hypertension, benign    GERD (gastroesophageal reflux disease)    Glaucoma    Hypothyroidism    Mixed hyperlipidemia    Palpitations    Spinal stenosis    Spinal stenosis of lumbar region at multiple levels 08/07/2016   Type 2 diabetes  mellitus (HCC)    Uterine cancer [facility])     Past Surgical History:  Procedure Laterality Date   ABDOMINAL HYSTERECTOMY     BREAST BIOPSY     benign   CATARACT EXTRACTION W/PHACO Left 03/31/2017   Procedure: CATARACT EXTRACTION PHACO AND INTRAOCULAR LENS PLACEMENT (IOC);  Surgeon: Jethro Bolus, MD;  Location: [facility];  Service: Ophthalmology;  Laterality: Left;  CDE: 8.67   CATARACT EXTRACTION W/PHACO Right 04/14/2017   Procedure: CATARACT EXTRACTION PHACO AND INTRAOCULAR LENS PLACEMENT RIGHT EYE;  Surgeon: Jethro Bolus, MD;  Location: [facility];  Service: Ophthalmology;  Laterality: Right;  CDE: 8.52   diabetes      Type 2   LIPOMA EXCISION     Multiple dental extractions     PORT-A-CATH REMOVAL Right 09/02/2017   Procedure: MINOR REMOVAL PORT-A-CATH;  Surgeon: Franky Macho, MD;  Location: [facility];  Service: General;  Laterality: Right;   REPLACEMENT TOTAL KNEE     TONSILLECTOMY AND ADENOIDECTOMY     TOTAL KNEE ARTHROPLASTY  08/04/2012   Procedure: TOTAL KNEE ARTHROPLASTY;  Surgeon: Loanne Drilling, MD;  Location: [facility];  Service: Orthopedics;  Laterality: Left;    Family Psychiatric History: See below  Family History:  Family History  Problem Relation Age of Onset   Stroke Mother    Heart failure Mother    Hypertension Father    Coronary artery disease Father     Social History:  Social History   Socioeconomic History   Marital status: Single    Spouse name: Not on file   Number of children: Not on file   Years of education: Not on file   Highest education level: Not on file  Occupational History   Not on file  Tobacco Use   Smoking status: Never   Smokeless tobacco: Never  Substance and Sexual Activity   Alcohol use: No    Comment: 07/10/2016 per pt no   Drug use: No    Comment: 8/3/2017per pt no    Sexual activity: Never  Other Topics Concern   Not on file  Social History Narrative   Not on file   Social Determinants of Health   Financial Resource Strain: Not on file  Food Insecurity: Not on file  Transportation Needs: Not on file  Physical Activity: Not on file  Stress: Not on file  Social Connections: Not on file    Allergies:  Allergies  Allergen Reactions   Morphine And Codeine Other (See Comments)    Hypotension   Other     Preservative in latanoprost causes redness, pt uses preservative free latanoprost    Paxil [Paroxetine Hcl]     unknown   Latex Rash    Metabolic Disorder Labs: No results found for: "HGBA1C", "MPG" No results found for: "PROLACTIN" No results found for: "CHOL", "TRIG", "HDL", "CHOLHDL", "VLDL", "LDLCALC" No results found  for: "TSH"  Therapeutic Level Labs: No results found for: "LITHIUM" No results found for: "VALPROATE" No results found for: "CBMZ"  Current Medications: Current Outpatient Medications  Medication Sig Dispense Refill   acetaminophen (TYLENOL 8 HOUR ARTHRITIS PAIN) 650 MG CR tablet Take 650 mg by mouth every 8 (eight) hours as needed for pain.     ALPRAZolam (XANAX) 0.25 MG tablet Take 1 tablet (0.25 mg total) by mouth 4 (four) times daily. 120 tablet 3   alum & mag hydroxide-simeth (MAALOX/MYLANTA) 200-200-20 MG/5ML suspension Take 30 mLs by mouth every 6 (six) hours as needed for indigestion or heartburn.  aspirin 81 MG tablet Take 81 mg by mouth at bedtime.      atorvastatin (LIPITOR) 10 MG tablet Take 10 mg by mouth every evening.      busPIRone (BUSPAR) 15 MG tablet Take 1 tablet (15 mg total) by mouth 2 (two) times daily. 60 tablet 3   calcium carbonate (TUMS - DOSED IN MG ELEMENTAL CALCIUM) 500 MG chewable tablet Chew 1 tablet by mouth daily as needed for indigestion or heartburn.     ibuprofen (ADVIL,MOTRIN) 200 MG tablet Take 200 mg by mouth 2 (two) times daily as needed for moderate pain.      latanoprost (XALATAN) 0.005 % ophthalmic solution Place 1 drop into both eyes at bedtime.     levothyroxine (SYNTHROID, LEVOTHROID) 88 MCG tablet Take 88 mcg by mouth daily before breakfast.      lisinopril (PRINIVIL,ZESTRIL) 10 MG tablet Take 10 mg by mouth every morning.      loratadine (CLARITIN) 10 MG tablet Take 10 mg by mouth daily as needed for allergies.     metFORMIN (GLUCOPHAGE) 500 MG tablet Take by mouth.     metoprolol succinate (TOPROL-XL) 25 MG 24 hr tablet take 1/2 tablet BY MOUTH every morning 3 tablet 0   metoprolol succinate (TOPROL-XL) 25 MG 24 hr tablet take 1/2 tablet BY MOUTH every morning 3 tablet 0   nitrofurantoin, macrocrystal-monohydrate, (MACROBID) 100 MG capsule Take 1 capsule (100 mg total) by mouth every 12 (twelve) hours. 14 capsule 0   polyethylene glycol  (MIRALAX / GLYCOLAX) packet Take 17 g by mouth daily as needed for mild constipation.      venlafaxine XR (EFFEXOR-XR) 150 MG 24 hr capsule TAKE ONE CAPSULE BY MOUTH DAILY WITH breakast 30 capsule 3   No current facility-administered medications for this visit.     Musculoskeletal: Strength & Muscle Tone: na Gait & Station: na Patient leans: N/A  Psychiatric Specialty Exam: Review of Systems  Genitourinary:  Positive for frequency, hematuria and urgency.  All other systems reviewed and are negative.   There were no vitals taken for this visit.There is no height or weight on file to calculate BMI.  General Appearance: NA  Eye Contact:  NA  Speech:  Clear and Coherent  Volume:  Normal  Mood:  Anxious  Affect:  NA  Thought Process:  Goal Directed  Orientation:  Full (Time, Place, and Person)  Thought Content: Rumination   Suicidal Thoughts:  No  Homicidal Thoughts:  No  Memory:  Immediate;   Good Recent;   Fair Remote;   NA  Judgement:  Good  Insight:  Fair  Psychomotor Activity:  Decreased  Concentration:  Concentration: Fair and Attention Span: Fair  Recall:  Good  Fund of Knowledge: Good  Language: Good  Akathisia:  No  Handed:  Right  AIMS (if indicated): not done  Assets:  Communication Skills Desire for Improvement Resilience Social Support  ADL's:  Intact  Cognition: WNL  Sleep:  Good   Screenings: GAD-7    Flowsheet Row Office Visit from 01/20/2023 in [facility] [place] Counselor from 04/20/2018 in [facility] [place] Counselor from 09/15/2016 in [facility] at [place]  Total GAD-7 Score 5 14 10       PHQ2-9    Flowsheet Row Office Visit from 01/20/2023 in [facility] at [place] Video Visit from 07/21/2022 in [facility] at [place] Video Visit from 03/19/2022 in [place]  [facility] at [facility] Video Visit from 11/26/2021 in [facility] at [place] Telemedicine from 08/05/2021 in [facility] at [facility] Total Score 2 0 0 1 0  PHQ-9 Total Score 7 -- -- -- --      Flowsheet Row Video Visit from 07/21/2022 in [facility] at [place] Video Visit from 03/19/2022 in [facility] at [place] Video Visit from 11/26/2021 in [facility] at [place]  C-SSRS RISK CATEGORY No Risk No Risk No Risk        Assessment and Plan: This patient is an 82 year old female with a history of significant depression and anxiety.  She is a bit anxious about her current medical issues but so far she is doing okay.  She will continue Xanax 0.25 mg 4 times daily as needed for anxiety, BuSpar 15 mg twice daily also for anxiety and Effexor XR 150 mg daily for depression.  She will return to see me in 3 months  Collaboration of Care: Collaboration of Care: Primary Care Provider AEB notes will be shared with PCP at patient's request  Patient/Guardian was advised Release of Information must be obtained prior to any record release in order to collaborate their care with an outside provider. Patient/Guardian was advised if they have not already done so to contact the registration department to sign all necessary forms in order for [place] to release information regarding their care.   Consent: Patient/Guardian gives verbal consent for treatment and assignment of benefits for services provided during this visit. Patient/Guardian expressed understanding and agreed to proceed.    [name], MD 06/23/2023, 10:04 AM

## 2023-06-23 NOTE — Progress Notes (Signed)
Pt is prepped for and in and out catherization. Patient was cleaned and prepped in a sterle fashion with betadine. A 16 fr catheter foley was inserted. Urine return was note 60 ml.  Performed by Kennyth Lose, CMA  Urine sent LAB

## 2023-06-23 NOTE — Patient Instructions (Addendum)
Recommendations regarding UTI prevention / management:  When UTI symptoms occur: Call urology office to request order for urine culture. We recommend waiting for urine culture result prior to use of any antibiotics.  For bladder pain/ burning with urination: Over the counter Pyridium (phenazopyridine) as needed (commonly known under the "AZO" brand). No more than 3 days consecutively at a time due to risk for methemoglobinemia, liver function issues, and bone health damage with long term use of Pyridium.  Routine use for UTI prevention: - Topical vaginal estrogen for vaginal atrophy. Adequate fluid intake (>1.5 liters/day) to flush out the urinary tract. - Go to the bathroom to urinate every 4-6 hours while awake to minimize urinary stasis / bacterial overgrowth in the bladder. - Proanthocyanidin (PAC) supplement 36 mg daily; must be soluble (insoluble form of PAC will be ineffective). Recommended brand: Ellura. This is an over-the-counter supplement (often must be found/ purchased online) supplement derived from cranberries with concentrated active component: Proanthocyanidin (PAC) 36 mg daily. Decreases bacterial adherence to bladder lining. Not recommended for patients with interstitial cystitis due to acidity. - Vitamin C supplement to acidify urine to minimize bacterial growth. Not recommended for patients with interstitial cystitis due to acidity. - Probiotic to maintain healthy vaginal microbiome to suppress bacteria at urethral opening. Brand recommendations: Darrold Junker (includes probiotic & D-mannose ), Feminine Balance (highest concentration of lactobacillus) or Hyperbiotic Pro 15.  Note for patients with diabetes: You may read about D-mannose powder for UTI prevention. That is an over the-counter supplement which decreases bacterial adherence to bladder lining. I would NOT advise that for you as a person with diabetes due to its sugar content.

## 2023-06-24 LAB — URINALYSIS, ROUTINE W REFLEX MICROSCOPIC
Bilirubin, UA: NEGATIVE
Glucose, UA: NEGATIVE
Nitrite, UA: NEGATIVE
Specific Gravity, UA: 1.01 (ref 1.005–1.030)
Urobilinogen, Ur: 0.2 mg/dL (ref 0.2–1.0)

## 2023-06-24 LAB — MICROSCOPIC EXAMINATION: RBC, Urine: >30 /hpf — AB (ref 0–2)

## 2023-06-26 LAB — URINE CULTURE

## 2023-06-29 ENCOUNTER — Other Ambulatory Visit: Payer: Self-pay | Admitting: Urology

## 2023-06-29 DIAGNOSIS — N390 Urinary tract infection, site not specified: Secondary | ICD-10-CM

## 2023-06-29 MED ORDER — AMOXICILLIN-POT CLAVULANATE 875-125 MG PO TABS
1.0000 | ORAL_TABLET | Freq: Two times a day (BID) | ORAL | 0 refills | Status: DC
Start: 2023-06-29 — End: 2023-10-13

## 2023-06-29 NOTE — Progress Notes (Signed)
Please let pt know urine culture result. I have sent prescription for Augmentin. Thanks.

## 2023-07-01 ENCOUNTER — Ambulatory Visit: Payer: Medicare HMO | Admitting: Urology

## 2023-07-02 ENCOUNTER — Other Ambulatory Visit: Payer: Self-pay | Admitting: Urology

## 2023-07-02 DIAGNOSIS — N1330 Unspecified hydronephrosis: Secondary | ICD-10-CM

## 2023-07-02 DIAGNOSIS — N390 Urinary tract infection, site not specified: Secondary | ICD-10-CM

## 2023-07-02 NOTE — Progress Notes (Signed)
Attempted to contact patient today - no answer & voice mailbox is full. Please let patient know that her RUS showed some hydronephrosis in both kidneys with no clear etiology. I am placing an order for CT abdomen/pelvis for further evaluation.

## 2023-07-08 DIAGNOSIS — H401130 Primary open-angle glaucoma, bilateral, stage unspecified: Secondary | ICD-10-CM | POA: Diagnosis not present

## 2023-07-14 ENCOUNTER — Encounter (HOSPITAL_COMMUNITY): Payer: Self-pay

## 2023-07-14 ENCOUNTER — Ambulatory Visit (HOSPITAL_COMMUNITY): Admission: RE | Admit: 2023-07-14 | Payer: Medicare HMO | Source: Ambulatory Visit

## 2023-07-14 ENCOUNTER — Ambulatory Visit (HOSPITAL_COMMUNITY): Payer: Medicare HMO

## 2023-07-14 DIAGNOSIS — Z1331 Encounter for screening for depression: Secondary | ICD-10-CM | POA: Diagnosis not present

## 2023-07-14 DIAGNOSIS — Z79899 Other long term (current) drug therapy: Secondary | ICD-10-CM | POA: Diagnosis not present

## 2023-07-14 DIAGNOSIS — E559 Vitamin D deficiency, unspecified: Secondary | ICD-10-CM | POA: Diagnosis not present

## 2023-07-14 DIAGNOSIS — Z0000 Encounter for general adult medical examination without abnormal findings: Secondary | ICD-10-CM | POA: Diagnosis not present

## 2023-07-14 DIAGNOSIS — Z7189 Other specified counseling: Secondary | ICD-10-CM | POA: Diagnosis not present

## 2023-07-14 DIAGNOSIS — I10 Essential (primary) hypertension: Secondary | ICD-10-CM | POA: Diagnosis not present

## 2023-07-14 DIAGNOSIS — R5383 Other fatigue: Secondary | ICD-10-CM | POA: Diagnosis not present

## 2023-07-14 DIAGNOSIS — Z299 Encounter for prophylactic measures, unspecified: Secondary | ICD-10-CM | POA: Diagnosis not present

## 2023-07-14 DIAGNOSIS — Z1339 Encounter for screening examination for other mental health and behavioral disorders: Secondary | ICD-10-CM | POA: Diagnosis not present

## 2023-07-14 DIAGNOSIS — E7800 Pure hypercholesterolemia, unspecified: Secondary | ICD-10-CM | POA: Diagnosis not present

## 2023-07-14 DIAGNOSIS — F339 Major depressive disorder, recurrent, unspecified: Secondary | ICD-10-CM | POA: Diagnosis not present

## 2023-07-15 ENCOUNTER — Telehealth: Payer: Self-pay

## 2023-07-15 NOTE — Telephone Encounter (Signed)
See below

## 2023-07-15 NOTE — Telephone Encounter (Signed)
Yesenia Reynolds w/ with pre service center left a voice message.  Needing to speak to a nurse for an approval for CT scan for patient.  Call [phone].

## 2023-07-17 NOTE — Telephone Encounter (Signed)
Returned call. No answer,auth approval on image referral

## 2023-07-20 ENCOUNTER — Ambulatory Visit: Payer: Medicare HMO | Admitting: Urology

## 2023-07-20 ENCOUNTER — Encounter: Payer: Self-pay | Admitting: Urology

## 2023-07-20 VITALS — BP 185/79 | HR 96

## 2023-07-20 DIAGNOSIS — N1330 Unspecified hydronephrosis: Secondary | ICD-10-CM | POA: Diagnosis not present

## 2023-07-20 DIAGNOSIS — N390 Urinary tract infection, site not specified: Secondary | ICD-10-CM | POA: Diagnosis not present

## 2023-07-20 DIAGNOSIS — Z87440 Personal history of urinary (tract) infections: Secondary | ICD-10-CM

## 2023-07-20 DIAGNOSIS — R399 Unspecified symptoms and signs involving the genitourinary system: Secondary | ICD-10-CM | POA: Diagnosis not present

## 2023-07-20 DIAGNOSIS — N952 Postmenopausal atrophic vaginitis: Secondary | ICD-10-CM | POA: Diagnosis not present

## 2023-07-20 LAB — BLADDER SCAN AMB NON-IMAGING: Scan Result: 0

## 2023-07-20 MED ORDER — ESTRADIOL 0.1 MG/GM VA CREA
TOPICAL_CREAM | VAGINAL | 3 refills | Status: AC
Start: 2023-07-20 — End: ?

## 2023-07-20 NOTE — Progress Notes (Signed)
History of Present Illness: Yesenia Reynolds is a 82 y.o. year old female returning for follow-up of lower urinary tract symptoms as well as recurrent urinary tract infections.  Seen previously by Evette Georges, NP.  She did have CT abdomen and pelvis with and without contrast ordered (order placed 07/02/2023).  This was preauthorized, and she apparently went to [facility] radiology to have this done, but the order apparently could not be found and they had to return home without CT being done.  CT was ordered because of renal ultrasound findings below:  Renal U/S 7.24.2024-- 1. Bilateral hydronephrosis, right slightly more pronounced than left. No clear etiology identified. If further evaluation is warranted, CT of the abdomen and pelvis could be considered. 2. No evidence of nephrolithiasis.  Currently, she does not feel like she has urinary tract infection.  No dysuria.  Does have urinary frequency, urgency, stress greater than urgency incontinence.  About 8 years out from hysterectomy for endometrial carcinoma.  She is not on estrogen cream for these recurrent infections.   Past Medical History:  Diagnosis Date   Agoraphobia with panic attacks 08/07/2016   Anxiety    Arthritis    Atrial fibrillation, currently in sinus rhythm    Cancer (HCC)    Endometrial   Depression    Endometrial cancer, FIGO stage IIIC (HCC) 08/08/2015   Essential hypertension, benign    GERD (gastroesophageal reflux disease)    Glaucoma    Hypothyroidism    Mixed hyperlipidemia    Palpitations    Spinal stenosis    Spinal stenosis of lumbar region at multiple levels 08/07/2016   Type 2 diabetes mellitus (HCC)    Uterine cancer [facility])     Past Surgical History:  Procedure Laterality Date   ABDOMINAL HYSTERECTOMY     BREAST BIOPSY     benign   CATARACT EXTRACTION W/PHACO Left 03/31/2017   Procedure: CATARACT EXTRACTION PHACO AND INTRAOCULAR LENS PLACEMENT (IOC);  Surgeon: Jethro Bolus, MD;  Location:  [facility];  Service: Ophthalmology;  Laterality: Left;  CDE: 8.67   CATARACT EXTRACTION W/PHACO Right 04/14/2017   Procedure: CATARACT EXTRACTION PHACO AND INTRAOCULAR LENS PLACEMENT RIGHT EYE;  Surgeon: Jethro Bolus, MD;  Location: [facility];  Service: Ophthalmology;  Laterality: Right;  CDE: 8.52   diabetes     Type 2   LIPOMA EXCISION     Multiple dental extractions     PORT-A-CATH REMOVAL Right 09/02/2017   Procedure: MINOR REMOVAL PORT-A-CATH;  Surgeon: Franky Macho, MD;  Location: [facility];  Service: General;  Laterality: Right;   REPLACEMENT TOTAL KNEE     TONSILLECTOMY AND ADENOIDECTOMY     TOTAL KNEE ARTHROPLASTY  08/04/2012   Procedure: TOTAL KNEE ARTHROPLASTY;  Surgeon: Loanne Drilling, MD;  Location: [facility];  Service: Orthopedics;  Laterality: Left;    Home Medications:  (Not in a hospital admission)   Allergies:  Allergies  Allergen Reactions   Morphine And Codeine Other (See Comments)    Hypotension   Other     Preservative in latanoprost causes redness, pt uses preservative free latanoprost    Paxil [Paroxetine Hcl]     unknown   Latex Rash    Family History  Problem Relation Age of Onset   Stroke Mother    Heart failure Mother    Hypertension Father    Coronary artery disease Father     Social History:  reports that she has never smoked. She has never used smokeless tobacco. She reports  that she does not drink alcohol and does not use drugs.  ROS: A complete review of systems was performed.  All systems are negative except for pertinent findings as noted.  Physical Exam:  Vital signs in last 24 hours: @VSRANGES @ General:  Alert and oriented, No acute distress HEENT: Normocephalic, atraumatic Neck: No JVD or lymphadenopathy Cardiovascular: Regular rate  Lungs: Normal inspiratory/expiratory excursion Extremities: No edema Neurologic: Grossly intact  I have reviewed prior pt notes  I have reviewed urinalysis results as well as urine culture results  I  have independently reviewed prior imaging--renal U/S images  I have reviewed prior orders for CT scan  Impression/Assessment:  1.  Recurrent urinary tract infections in woman with postmenopausal atrophic vaginal changes, not currently on estrogen cream (8 years out from diagnosis/treatment for endometrial carcinoma, currently cancer free)  2.  Bilateral hydronephrosis, etiology unknown  Plan:  1.  I apologize for them not being able to proceed with CT scan.  I did show them that the order has been properly placed  2.  We will work on getting an appointment set up for CT scan, results will be sent by way of MyChart  3.  I think at this point with her being years out from curative hysterectomy for endometrial carcinoma it is okay to start her on estrogen cream.  That was sent in.  Including office face time, researching orders for CT scan, reviewing old chart, 55 minutes spent  [name] 07/20/2023, 8:46 AM  Bertram Millard.  MD

## 2023-07-22 ENCOUNTER — Ambulatory Visit (HOSPITAL_COMMUNITY)
Admission: RE | Admit: 2023-07-22 | Discharge: 2023-07-22 | Disposition: A | Discharge status: Home / Self Care | Payer: Medicare HMO | Source: Ambulatory Visit | Attending: Urology | Admitting: Urology

## 2023-07-22 DIAGNOSIS — N1330 Unspecified hydronephrosis: Secondary | ICD-10-CM | POA: Diagnosis not present

## 2023-07-22 DIAGNOSIS — N390 Urinary tract infection, site not specified: Secondary | ICD-10-CM | POA: Insufficient documentation

## 2023-07-22 DIAGNOSIS — R319 Hematuria, unspecified: Secondary | ICD-10-CM | POA: Diagnosis not present

## 2023-07-22 MED ORDER — IOHEXOL 300 MG/ML  SOLN
100.0000 mL | Freq: Once | INTRAMUSCULAR | Status: AC | PRN
  Administered 2023-07-22: 100 mL via INTRAVENOUS

## 2023-07-23 DIAGNOSIS — Z0000 Encounter for general adult medical examination without abnormal findings: Secondary | ICD-10-CM | POA: Diagnosis not present

## 2023-07-23 DIAGNOSIS — Z299 Encounter for prophylactic measures, unspecified: Secondary | ICD-10-CM | POA: Diagnosis not present

## 2023-07-23 DIAGNOSIS — F339 Major depressive disorder, recurrent, unspecified: Secondary | ICD-10-CM | POA: Diagnosis not present

## 2023-07-23 DIAGNOSIS — I10 Essential (primary) hypertension: Secondary | ICD-10-CM | POA: Diagnosis not present

## 2023-07-23 LAB — POCT I-STAT CREATININE: Creatinine, Ser: 0.9 mg/dL (ref 0.44–1.00)

## 2023-07-30 NOTE — Progress Notes (Signed)
Please let patient know there were no acute GU findings on her CT. No GU stones, masses, or hydronephrosis. Mild bladder thickening likely due to inflammation related to UTIs. Based on history however I do recommend follow up with MD for cystoscopic evaluation. Please assist patient with scheduling. Thank you.

## 2023-08-04 ENCOUNTER — Telehealth (INDEPENDENT_AMBULATORY_CARE_PROVIDER_SITE_OTHER): Payer: Medicare HMO | Admitting: Psychiatry

## 2023-08-04 ENCOUNTER — Encounter (HOSPITAL_COMMUNITY): Payer: Self-pay | Admitting: Psychiatry

## 2023-08-04 ENCOUNTER — Ambulatory Visit: Payer: Medicare HMO | Admitting: Urology

## 2023-08-04 ENCOUNTER — Telehealth: Payer: Self-pay | Admitting: Urology

## 2023-08-04 DIAGNOSIS — F32A Depression, unspecified: Secondary | ICD-10-CM

## 2023-08-04 DIAGNOSIS — F411 Generalized anxiety disorder: Secondary | ICD-10-CM

## 2023-08-04 MED ORDER — ALPRAZOLAM 0.25 MG PO TABS
0.2500 mg | ORAL_TABLET | Freq: Four times a day (QID) | ORAL | 3 refills | Status: DC
Start: 2023-08-04 — End: 2023-10-23

## 2023-08-04 MED ORDER — BUSPIRONE HCL 15 MG PO TABS: 15.0000 mg | ORAL_TABLET | Freq: Two times a day (BID) | ORAL | 3 refills | Status: DC

## 2023-08-04 MED ORDER — VENLAFAXINE HCL ER 150 MG PO CP24: ORAL_CAPSULE | ORAL | 3 refills | Status: DC

## 2023-08-04 NOTE — Telephone Encounter (Signed)
Patient would like a call back she seen the results for the CT and believes she needs to be scheduled for a Cysto. Please call

## 2023-08-04 NOTE — Progress Notes (Signed)
Virtual Visit via Telephone Note  I connected with Yesenia Reynolds on 08/04/23 at  9:20 AM EDT by telephone and verified that I am speaking with the correct person using two identifiers.  Location: Patient: home Provider: office   I discussed the limitations, risks, security and privacy concerns of performing an evaluation and management service by telephone and the availability of in person appointments. I also discussed with the patient that there may be a patient responsible charge related to this service. The patient expressed understanding and agreed to proceed.      I discussed the assessment and treatment plan with the patient. The patient was provided an opportunity to ask questions and all were answered. The patient agreed with the plan and demonstrated an understanding of the instructions.   The patient was advised to call back or seek an in-person evaluation if the symptoms worsen or if the condition fails to improve as anticipated.  I provided 20 minutes of non-face-to-face time during this encounter.   Yesenia Ruder, MD  [facility] MD/PA/NP OP Progress Note  08/04/2023 9:47 AM CURISSA Reynolds  MRN:  [id number]  Chief Complaint:  Chief Complaint  Patient presents with   Anxiety   Depression   Follow-up   HPI: This patient is an 82 year old single white female who lives in a local rest home.  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  The patient returns for follow-up by phone after 6 weeks regarding her depression and anxiety.  She is still battling UTIs.  Over the last 2 weeks however she has been feeling better as she finally got through it.  She has had a renal ultrasound and CT scan which really have not shown much other than bladder thickening.  However she is probably going to have cystoscopy.  She states since all this happened she has been very anxious and has not felt like driving on her own or even going out on  her own.  Her sister has to accompany her.  She does not always take the Xanax 4 times a day and I told her for right now it might help.  She is sleeping well and eating well.  She is trying to push water through the day.  She denies any thoughts of self-harm or suicide.  She is a little confused at times and was not sure about the frequency of her Effexor until she checked it by looking at her bubble packs. Visit Diagnosis:    ICD-10-CM   1. Generalized anxiety disorder  F41.1 ALPRAZolam (XANAX) 0.25 MG tablet      Past Psychiatric History: Hospitalized about 6 years ago for depression and anxiety was responded to ECT  Past Medical History:  Past Medical History:  Diagnosis Date   Agoraphobia with panic attacks 08/07/2016   Anxiety    Arthritis    Atrial fibrillation, currently in sinus rhythm    Cancer (HCC)    Endometrial   Depression    Endometrial cancer, FIGO stage IIIC (HCC) 08/08/2015   Essential hypertension, benign    GERD (gastroesophageal reflux disease)    Glaucoma    Hypothyroidism    Mixed hyperlipidemia    Palpitations    Spinal stenosis    Spinal stenosis of lumbar region at multiple levels 08/07/2016   Type 2 diabetes mellitus (HCC)    Uterine cancer [facility])     Past Surgical History:  Procedure Laterality Date  ABDOMINAL HYSTERECTOMY     BREAST BIOPSY     benign   CATARACT EXTRACTION W/PHACO Left 03/31/2017   Procedure: CATARACT EXTRACTION PHACO AND INTRAOCULAR LENS PLACEMENT (IOC);  Surgeon: Jethro Bolus, MD;  Location: [facility];  Service: Ophthalmology;  Laterality: Left;  CDE: 8.67   CATARACT EXTRACTION W/PHACO Right 04/14/2017   Procedure: CATARACT EXTRACTION PHACO AND INTRAOCULAR LENS PLACEMENT RIGHT EYE;  Surgeon: Jethro Bolus, MD;  Location: [facility];  Service: Ophthalmology;  Laterality: Right;  CDE: 8.52   diabetes     Type 2   LIPOMA EXCISION     Multiple dental extractions     PORT-A-CATH REMOVAL Right 09/02/2017   Procedure: MINOR REMOVAL  PORT-A-CATH;  Surgeon: Franky Macho, MD;  Location: [facility];  Service: General;  Laterality: Right;   REPLACEMENT TOTAL KNEE     TONSILLECTOMY AND ADENOIDECTOMY     TOTAL KNEE ARTHROPLASTY  08/04/2012   Procedure: TOTAL KNEE ARTHROPLASTY;  Surgeon: Loanne Drilling, MD;  Location: [facility];  Service: Orthopedics;  Laterality: Left;    Family Psychiatric History: See below  Family History:  Family History  Problem Relation Age of Onset   Stroke Mother    Heart failure Mother    Hypertension Father    Coronary artery disease Father     Social History:  Social History   Socioeconomic History   Marital status: Single    Spouse name: Not on file   Number of children: Not on file   Years of education: Not on file   Highest education level: Not on file  Occupational History   Not on file  Tobacco Use   Smoking status: Never   Smokeless tobacco: Never  Substance and Sexual Activity   Alcohol use: No    Comment: 07/10/2016 per pt no   Drug use: No    Comment: 8/3/2017per pt no    Sexual activity: Never  Other Topics Concern   Not on file  Social History Narrative   Not on file   Social Determinants of Health   Financial Resource Strain: Not on file  Food Insecurity: Not on file  Transportation Needs: Not on file  Physical Activity: Not on file  Stress: Not on file  Social Connections: Not on file    Allergies:  Allergies  Allergen Reactions   Morphine And Codeine Other (See Comments)    Hypotension   Other     Preservative in latanoprost causes redness, pt uses preservative free latanoprost    Paxil [Paroxetine Hcl]     unknown   Latex Rash    Metabolic Disorder Labs: No results found for: "HGBA1C", "MPG" No results found for: "PROLACTIN" No results found for: "CHOL", "TRIG", "HDL", "CHOLHDL", "VLDL", "LDLCALC" No results found for: "TSH"  Therapeutic Level Labs: No results found for: "LITHIUM" No results found for: "VALPROATE" No results found for:  "CBMZ"  Current Medications: Current Outpatient Medications  Medication Sig Dispense Refill   acetaminophen (TYLENOL 8 HOUR ARTHRITIS PAIN) 650 MG CR tablet Take 650 mg by mouth every 8 (eight) hours as needed for pain.     ALPRAZolam (XANAX) 0.25 MG tablet Take 1 tablet (0.25 mg total) by mouth 4 (four) times daily. 120 tablet 3   alum & mag hydroxide-simeth (MAALOX/MYLANTA) 200-200-20 MG/5ML suspension Take 30 mLs by mouth every 6 (six) hours as needed for indigestion or heartburn.     amoxicillin-clavulanate (AUGMENTIN) 875-125 MG tablet Take 1 tablet by mouth every 12 (twelve) hours. 14 tablet 0  aspirin 81 MG tablet Take 81 mg by mouth at bedtime.      atorvastatin (LIPITOR) 10 MG tablet Take 10 mg by mouth every evening.      busPIRone (BUSPAR) 15 MG tablet Take 1 tablet (15 mg total) by mouth 2 (two) times daily. 60 tablet 3   calcium carbonate (TUMS - DOSED IN MG ELEMENTAL CALCIUM) 500 MG chewable tablet Chew 1 tablet by mouth daily as needed for indigestion or heartburn.     estradiol (ESTRACE) 0.1 MG/GM vaginal cream Insert 1/2" in vagina 2 nights/week 42.5 g 3   ibuprofen (ADVIL,MOTRIN) 200 MG tablet Take 200 mg by mouth 2 (two) times daily as needed for moderate pain.      latanoprost (XALATAN) 0.005 % ophthalmic solution Place 1 drop into both eyes at bedtime.     levothyroxine (SYNTHROID, LEVOTHROID) 88 MCG tablet Take 88 mcg by mouth daily before breakfast.      lisinopril (PRINIVIL,ZESTRIL) 10 MG tablet Take 10 mg by mouth every morning.      loratadine (CLARITIN) 10 MG tablet Take 10 mg by mouth daily as needed for allergies.     metFORMIN (GLUCOPHAGE) 500 MG tablet Take by mouth.     metoprolol succinate (TOPROL-XL) 25 MG 24 hr tablet take 1/2 tablet BY MOUTH every morning 3 tablet 0   metoprolol succinate (TOPROL-XL) 25 MG 24 hr tablet take 1/2 tablet BY MOUTH every morning 3 tablet 0   nitrofurantoin, macrocrystal-monohydrate, (MACROBID) 100 MG capsule Take 1 capsule (100  mg total) by mouth every 12 (twelve) hours. 14 capsule 0   polyethylene glycol (MIRALAX / GLYCOLAX) packet Take 17 g by mouth daily as needed for mild constipation.      venlafaxine XR (EFFEXOR-XR) 150 MG 24 hr capsule TAKE ONE CAPSULE BY MOUTH DAILY WITH breakast 30 capsule 3   No current facility-administered medications for this visit.     Musculoskeletal: Strength & Muscle Tone: na Gait & Station: na Patient leans: N/A  Psychiatric Specialty Exam: Review of Systems  Psychiatric/Behavioral:  The patient is nervous/anxious.   All other systems reviewed and are negative.   There were no vitals taken for this visit.There is no height or weight on file to calculate BMI.  General Appearance: NA  Eye Contact:  NA  Speech:  Clear and Coherent  Volume:  Normal  Mood:  Anxious  Affect:  na  Thought Process:  Goal Directed  Orientation:  Full (Time, Place, and Person)  Thought Content: WDL   Suicidal Thoughts:  No  Homicidal Thoughts:  No  Memory:  Immediate;   Good Recent;   Good Remote;   Fair  Judgement:  Good  Insight:  Fair  Psychomotor Activity:  Decreased  Concentration:  Concentration: Fair and Attention Span: Fair  Recall:  Fair  Fund of Knowledge: Good  Language: Good  Akathisia:  No  Handed:  Right  AIMS (if indicated): not done  Assets:  Communication Skills Desire for Improvement Resilience Social Support  ADL's:  Intact  Cognition: WNL  Sleep:  Good   Screenings: GAD-7    Flowsheet Row Office Visit from 01/20/2023 in [facility] at [place] Counselor from 04/20/2018 in [facility] [place] Counselor from 09/15/2016 in [facility] at [place]  Total GAD-7 Score 5 14 10       PHQ2-9    Flowsheet Row Office Visit from 01/20/2023 in [facility] at [facility] Video Visit  from 07/21/2022 in [facility] at  [place] Video Visit from 03/19/2022 in [facility] at [place] Video Visit from 11/26/2021 in [facility] at [place] Telemedicine from 08/05/2021 in [facility] at [facility] Total Score 2 0 0 1 0  PHQ-9 Total Score 7 -- -- -- --      Flowsheet Row Video Visit from 07/21/2022 in [facility] at [place] Video Visit from 03/19/2022 in [facility] at [place] Video Visit from 11/26/2021 in [facility] at [place]  C-SSRS RISK CATEGORY No Risk No Risk No Risk        Assessment and Plan: This patient is an 82 year old female with a history of significant depression and anxiety.  She is a bit more anxious and she has been going through all these treatments for UTIs.  She will continue Xanax 0.25 mg and try to take it 4 times daily for now.  She will continue BuSpar 15 mg twice daily also for anxiety and Effexor XR 150 mg daily for depression.  She will return to see me in 2 months  Collaboration of Care: Collaboration of Care: Primary Care Provider AEB notes will be shared with PCP at patient's request  Patient/Guardian was advised Release of Information must be obtained prior to any record release in order to collaborate their care with an outside provider. Patient/Guardian was advised if they have not already done so to contact the registration department to sign all necessary forms in order for [place] to release information regarding their care.   Consent: Patient/Guardian gives verbal consent for treatment and assignment of benefits for services provided during this visit. Patient/Guardian expressed understanding and agreed to proceed.    Yesenia Ruder, MD 08/04/2023, 9:47 AM

## 2023-08-04 NOTE — Telephone Encounter (Signed)
Patient is made aware of cysto appointment scheduled  and voiced understanding.

## 2023-08-11 DIAGNOSIS — H401133 Primary open-angle glaucoma, bilateral, severe stage: Secondary | ICD-10-CM | POA: Diagnosis not present

## 2023-08-11 DIAGNOSIS — H47323 Drusen of optic disc, bilateral: Secondary | ICD-10-CM | POA: Diagnosis not present

## 2023-08-30 ENCOUNTER — Emergency Department (HOSPITAL_COMMUNITY)
Admission: EM | Admit: 2023-08-30 | Discharge: 2023-08-30 | Disposition: A | Discharge status: Home / Self Care | Payer: Medicare HMO | Attending: Emergency Medicine | Admitting: Emergency Medicine

## 2023-08-30 ENCOUNTER — Other Ambulatory Visit: Payer: Self-pay

## 2023-08-30 ENCOUNTER — Encounter (HOSPITAL_COMMUNITY): Payer: Self-pay

## 2023-08-30 DIAGNOSIS — Z91040 Latex allergy status: Secondary | ICD-10-CM | POA: Insufficient documentation

## 2023-08-30 DIAGNOSIS — I10 Essential (primary) hypertension: Secondary | ICD-10-CM | POA: Diagnosis not present

## 2023-08-30 DIAGNOSIS — E039 Hypothyroidism, unspecified: Secondary | ICD-10-CM | POA: Insufficient documentation

## 2023-08-30 DIAGNOSIS — Z7982 Long term (current) use of aspirin: Secondary | ICD-10-CM | POA: Insufficient documentation

## 2023-08-30 DIAGNOSIS — Z79899 Other long term (current) drug therapy: Secondary | ICD-10-CM | POA: Diagnosis not present

## 2023-08-30 DIAGNOSIS — R339 Retention of urine, unspecified: Secondary | ICD-10-CM | POA: Diagnosis not present

## 2023-08-30 DIAGNOSIS — E119 Type 2 diabetes mellitus without complications: Secondary | ICD-10-CM | POA: Diagnosis not present

## 2023-08-30 DIAGNOSIS — Z7984 Long term (current) use of oral hypoglycemic drugs: Secondary | ICD-10-CM | POA: Insufficient documentation

## 2023-08-30 LAB — CBC WITH DIFFERENTIAL/PLATELET
Abs Immature Granulocytes: 0.02 10*3/uL (ref 0.00–0.07)
Basophils Absolute: 0 10*3/uL (ref 0.0–0.1)
Basophils Relative: 0 %
Eosinophils Absolute: 0.2 10*3/uL (ref 0.0–0.5)
Eosinophils Relative: 2 %
HCT: 34 % — ABNORMAL LOW (ref 36.0–46.0)
Hemoglobin: 10.6 g/dL — ABNORMAL LOW (ref 12.0–15.0)
Immature Granulocytes: 0 %
Lymphocytes Relative: 13 %
Lymphs Abs: 1.1 10*3/uL (ref 0.7–4.0)
MCH: 27.5 pg (ref 26.0–34.0)
MCHC: 31.2 g/dL (ref 30.0–36.0)
MCV: 88.3 fL (ref 80.0–100.0)
Monocytes Absolute: 0.7 10*3/uL (ref 0.1–1.0)
Monocytes Relative: 9 %
Neutro Abs: 6.4 10*3/uL (ref 1.7–7.7)
Neutrophils Relative %: 76 %
Platelets: 313 10*3/uL (ref 150–400)
RBC: 3.85 MIL/uL — ABNORMAL LOW (ref 3.87–5.11)
RDW: 13.3 % (ref 11.5–15.5)
WBC: 8.4 10*3/uL (ref 4.0–10.5)
nRBC: 0 % (ref 0.0–0.2)

## 2023-08-30 LAB — URINALYSIS, ROUTINE W REFLEX MICROSCOPIC
Bacteria, UA: NONE SEEN
Bilirubin Urine: NEGATIVE
Glucose, UA: NEGATIVE mg/dL
Ketones, ur: NEGATIVE mg/dL
Nitrite: NEGATIVE
Protein, ur: 100 mg/dL — AB
Specific Gravity, Urine: 1.006 (ref 1.005–1.030)
pH: 8 (ref 5.0–8.0)

## 2023-08-30 LAB — BASIC METABOLIC PANEL
Anion gap: 10 (ref 5–15)
BUN: 15 mg/dL (ref 8–23)
CO2: 31 mmol/L (ref 22–32)
Calcium: 8.5 mg/dL — ABNORMAL LOW (ref 8.9–10.3)
Chloride: 96 mmol/L — ABNORMAL LOW (ref 98–111)
Creatinine, Ser: 1.02 mg/dL — ABNORMAL HIGH (ref 0.44–1.00)
GFR, Estimated: 55 mL/min — ABNORMAL LOW (ref >60)
Glucose, Bld: 142 mg/dL — ABNORMAL HIGH (ref 70–99)
Potassium: 3.2 mmol/L — ABNORMAL LOW (ref 3.5–5.1)
Sodium: 137 mmol/L (ref 135–145)

## 2023-08-30 NOTE — ED Notes (Signed)
Pt had peed in her brief before in and out. Was not able to get hardly any urine.

## 2023-08-30 NOTE — Discharge Instructions (Signed)
Workup here today without acute findings.  No evidence of any significant urinary retention.  Bladder scan showed no significant urine.  In 1 week In-N-Out cath you we got very minimal urine out.  Urinalysis did have some increased cells but not bacteria.  Sent for culture.  You do have a history of probable interstitial cystitis is why urology is planning the cystoscope.  Return for any new or worse symptoms.  Urine culture sent if it grows anything significant you will be contacted.

## 2023-08-30 NOTE — ED Provider Notes (Signed)
[facility] AT [facility] Provider Note   CSN: [id number] Arrival date & time: 08/30/23  0865     History  Chief Complaint  Patient presents with   Urinary Retention    Yesenia Reynolds is a 82 y.o. female.  Patient being followed by urology.  Here in [place].  Patient with concerns for interstitial cystitis.  They are planning a cystoscope.  Patient states she has been having difficulty with urinary retention and then sometimes has urinary frequency.  Patient says that recently she would have trouble with retention.  Patient was bladder scanned here showed minimal fluid.  We did do In-N-Out cath minimal amount of urine.  So no significant retention here today.  Has medical history sniffer hyperlipidemia hypothyroidism hypertension type 2 diabetes.  History of endometrial cancer stage III in 2016.  History of atrial fibrillation in the past.  Never used tobacco products.       Home Medications Prior to Admission medications   Medication Sig Start Date End Date Taking? Authorizing Provider  acetaminophen (TYLENOL 8 HOUR ARTHRITIS PAIN) 650 MG CR tablet Take 650 mg by mouth every 8 (eight) hours as needed for pain.    [provider]  ALPRAZolam Prudy Feeler) 0.25 MG tablet Take 1 tablet (0.25 mg total) by mouth 4 (four) times daily. 08/04/23   Myrlene Broker, MD  alum & mag hydroxide-simeth (MAALOX/MYLANTA) 200-200-20 MG/5ML suspension Take 30 mLs by mouth every 6 (six) hours as needed for indigestion or heartburn.    [provider]  amoxicillin-clavulanate (AUGMENTIN) 875-125 MG tablet Take 1 tablet by mouth every 12 (twelve) hours. 06/29/23   Donnita Falls, FNP  aspirin 81 MG tablet Take 81 mg by mouth at bedtime.     [provider]  atorvastatin (LIPITOR) 10 MG tablet Take 10 mg by mouth every evening.     [provider]  busPIRone (BUSPAR) 15 MG tablet Take 1 tablet (15 mg total) by mouth 2 (two) times daily.  08/04/23   Myrlene Broker, MD  calcium carbonate (TUMS - DOSED IN MG ELEMENTAL CALCIUM) 500 MG chewable tablet Chew 1 tablet by mouth daily as needed for indigestion or heartburn.    [provider]  estradiol (ESTRACE) 0.1 MG/GM vaginal cream Insert 1/2" in vagina 2 nights/week 07/20/23   Marcine Matar, MD  ibuprofen (ADVIL,MOTRIN) 200 MG tablet Take 200 mg by mouth 2 (two) times daily as needed for moderate pain.     [provider]  latanoprost (XALATAN) 0.005 % ophthalmic solution Place 1 drop into both eyes at bedtime. 01/12/15   [provider]  levothyroxine (SYNTHROID, LEVOTHROID) 88 MCG tablet Take 88 mcg by mouth daily before breakfast.     [provider]  lisinopril (PRINIVIL,ZESTRIL) 10 MG tablet Take 10 mg by mouth every morning.     [provider]  loratadine (CLARITIN) 10 MG tablet Take 10 mg by mouth daily as needed for allergies.    [provider]  metFORMIN (GLUCOPHAGE) 500 MG tablet Take by mouth. 07/08/18 08/07/18  [provider]  metoprolol succinate (TOPROL-XL) 25 MG 24 hr tablet take 1/2 tablet BY MOUTH every morning 07/09/18   Jonelle Sidle, MD  metoprolol succinate (TOPROL-XL) 25 MG 24 hr tablet take 1/2 tablet BY MOUTH every morning 10/25/18   Jonelle Sidle, MD  nitrofurantoin, macrocrystal-monohydrate, (MACROBID) 100 MG capsule Take 1 capsule (100 mg total) by mouth every 12 (twelve) hours. 06/02/23   [place],  Eleonore Chiquito, FNP  polyethylene glycol (MIRALAX / GLYCOLAX) packet Take 17 g by mouth daily as needed for mild constipation.     [provider]  venlafaxine XR (EFFEXOR-XR) 150 MG 24 hr capsule TAKE ONE CAPSULE BY MOUTH DAILY WITH breakast 08/04/23   Myrlene Broker, MD      Allergies    Morphine and codeine, Other, Paxil [paroxetine hcl], and Latex    Review of Systems   Review of Systems  Constitutional:  Negative for chills and fever.  HENT:  Negative for ear pain and sore throat.    Eyes:  Negative for pain and visual disturbance.  Respiratory:  Negative for cough and shortness of breath.   Cardiovascular:  Negative for chest pain and palpitations.  Gastrointestinal:  Negative for abdominal pain and vomiting.  Genitourinary:  Positive for difficulty urinating and frequency. Negative for dysuria and hematuria.  Musculoskeletal:  Negative for arthralgias and back pain.  Skin:  Negative for color change and rash.  Neurological:  Negative for seizures and syncope.  All other systems reviewed and are negative.   Physical Exam Updated Vital Signs BP (!) 191/80   Pulse 87   Ht 1.6 m (5\' 3" )   Wt 79.4 kg   SpO2 94%   BMI 31.00 kg/m  Physical Exam Vitals and nursing note reviewed.  Constitutional:      General: She is not in acute distress.    Appearance: She is well-developed.  HENT:     Head: Normocephalic and atraumatic.  Eyes:     Conjunctiva/sclera: Conjunctivae normal.  Cardiovascular:     Rate and Rhythm: Normal rate and regular rhythm.     Heart sounds: No murmur heard. Pulmonary:     Effort: Pulmonary effort is normal. No respiratory distress.     Breath sounds: Normal breath sounds.  Abdominal:     General: There is no distension.     Palpations: Abdomen is soft.     Tenderness: There is no abdominal tenderness. There is no guarding.  Musculoskeletal:        General: No swelling.     Cervical back: Neck supple.  Skin:    General: Skin is warm and dry.     Capillary Refill: Capillary refill takes less than 2 seconds.  Neurological:     General: No focal deficit present.     Mental Status: She is alert and oriented to person, place, and time.  Psychiatric:        Mood and Affect: Mood normal.     ED Results / Procedures / Treatments   Labs (all labs ordered are listed, but only abnormal results are displayed) Labs Reviewed  CBC WITH DIFFERENTIAL/PLATELET - Abnormal; Notable for the following components:      Result Value   RBC 3.85 (*)     Hemoglobin 10.6 (*)    HCT 34.0 (*)    All other components within normal limits  BASIC METABOLIC PANEL - Abnormal; Notable for the following components:   Potassium 3.2 (*)    Chloride 96 (*)    Glucose, Bld 142 (*)    Creatinine, Ser 1.02 (*)    Calcium 8.5 (*)    GFR, Estimated 55 (*)    All other components within normal limits  URINALYSIS, ROUTINE W REFLEX MICROSCOPIC - Abnormal; Notable for the following components:   Color, Urine STRAW (*)    Hgb urine dipstick MODERATE (*)    Protein, ur 100 (*)    Leukocytes,Ua  LARGE (*)    All other components within normal limits  URINE CULTURE    EKG None  Radiology No results found.  Procedures Procedures    Medications Ordered in ED Medications - No data to display  ED Course/ Medical Decision Making/ A&P                                 Medical Decision Making Amount and/or Complexity of Data Reviewed Labs: ordered.   Workup here CBC no leukocytosis hemoglobin 10.6 platelets 313.  Basic metabolic panel sodium a little low but normal at 137 potassium a little low at 3.2.  Glucose 142 GFR 55.  And creatinine 1.02 so no significant change in her renal function.  Urinalysis leukocytes were large white blood cells 11-20 RBC 0-5 bacteria none.  As stated patient has history of interstitial cystitis most likely.  So we will send culture.  Will not treat this urine plus there is no bacteria in it.  Patient will follow-up with urology.  Patient without any significant evidence of urinary retention here bladder scan showed essentially no urine will be In-N-Out catheter we essentially got minimal urine out.  Patient was eventually able to go on and urinate and we were able to get urine sent and that will be sent for culture.   Final Clinical Impression(s) / ED Diagnoses Final diagnoses:  Urinary retention    Rx / DC Orders ED Discharge Orders     None         Vanetta Mulders, MD 08/30/23 [phone]

## 2023-08-30 NOTE — ED Triage Notes (Signed)
Pt comes in with urinary retention this morning. Pt states she has been following urology for the past couple of months for this issue. Pt states that she either has urine frequency/ retention all the time and this morning "leaking through the night" but woke up unable to urinate.

## 2023-08-31 ENCOUNTER — Telehealth: Payer: Self-pay

## 2023-08-31 NOTE — Telephone Encounter (Signed)
Return call to patient. Patient states she is having a lot of bladder issues. Patient states sometime she able to voided and other time unable to voided completely. Patient is scheduled for a cysto with Dr. Retta Diones. Patient's cysto appointment moved up after patient's ER follow up. Patient voiced understanding.

## 2023-09-01 DIAGNOSIS — Z794 Long term (current) use of insulin: Secondary | ICD-10-CM | POA: Diagnosis not present

## 2023-09-01 DIAGNOSIS — I517 Cardiomegaly: Secondary | ICD-10-CM | POA: Diagnosis not present

## 2023-09-01 DIAGNOSIS — Z792 Long term (current) use of antibiotics: Secondary | ICD-10-CM | POA: Diagnosis not present

## 2023-09-01 DIAGNOSIS — K5900 Constipation, unspecified: Secondary | ICD-10-CM | POA: Diagnosis not present

## 2023-09-01 DIAGNOSIS — N3000 Acute cystitis without hematuria: Secondary | ICD-10-CM | POA: Diagnosis not present

## 2023-09-01 DIAGNOSIS — D649 Anemia, unspecified: Secondary | ICD-10-CM | POA: Diagnosis not present

## 2023-09-01 DIAGNOSIS — J9811 Atelectasis: Secondary | ICD-10-CM | POA: Diagnosis not present

## 2023-09-01 DIAGNOSIS — Z79899 Other long term (current) drug therapy: Secondary | ICD-10-CM | POA: Diagnosis not present

## 2023-09-01 DIAGNOSIS — F411 Generalized anxiety disorder: Secondary | ICD-10-CM | POA: Diagnosis not present

## 2023-09-01 DIAGNOSIS — I499 Cardiac arrhythmia, unspecified: Secondary | ICD-10-CM | POA: Diagnosis not present

## 2023-09-01 DIAGNOSIS — N390 Urinary tract infection, site not specified: Secondary | ICD-10-CM | POA: Diagnosis not present

## 2023-09-01 DIAGNOSIS — R918 Other nonspecific abnormal finding of lung field: Secondary | ICD-10-CM | POA: Diagnosis not present

## 2023-09-01 DIAGNOSIS — Z888 Allergy status to other drugs, medicaments and biological substances status: Secondary | ICD-10-CM | POA: Diagnosis not present

## 2023-09-01 DIAGNOSIS — Z8542 Personal history of malignant neoplasm of other parts of uterus: Secondary | ICD-10-CM | POA: Diagnosis not present

## 2023-09-01 DIAGNOSIS — Z79890 Hormone replacement therapy (postmenopausal): Secondary | ICD-10-CM | POA: Diagnosis not present

## 2023-09-01 DIAGNOSIS — B9621 Shiga toxin-producing Escherichia coli [E. coli] (STEC) O157 as the cause of diseases classified elsewhere: Secondary | ICD-10-CM | POA: Diagnosis not present

## 2023-09-01 DIAGNOSIS — I10 Essential (primary) hypertension: Secondary | ICD-10-CM | POA: Diagnosis not present

## 2023-09-01 DIAGNOSIS — R9389 Abnormal findings on diagnostic imaging of other specified body structures: Secondary | ICD-10-CM | POA: Diagnosis not present

## 2023-09-01 DIAGNOSIS — R410 Disorientation, unspecified: Secondary | ICD-10-CM | POA: Diagnosis not present

## 2023-09-01 DIAGNOSIS — B961 Klebsiella pneumoniae [K. pneumoniae] as the cause of diseases classified elsewhere: Secondary | ICD-10-CM | POA: Diagnosis not present

## 2023-09-01 DIAGNOSIS — G9341 Metabolic encephalopathy: Secondary | ICD-10-CM | POA: Diagnosis not present

## 2023-09-01 DIAGNOSIS — M1990 Unspecified osteoarthritis, unspecified site: Secondary | ICD-10-CM | POA: Diagnosis not present

## 2023-09-01 DIAGNOSIS — Z7984 Long term (current) use of oral hypoglycemic drugs: Secondary | ICD-10-CM | POA: Diagnosis not present

## 2023-09-01 DIAGNOSIS — R0981 Nasal congestion: Secondary | ICD-10-CM | POA: Diagnosis not present

## 2023-09-01 DIAGNOSIS — R739 Hyperglycemia, unspecified: Secondary | ICD-10-CM | POA: Diagnosis not present

## 2023-09-01 DIAGNOSIS — E039 Hypothyroidism, unspecified: Secondary | ICD-10-CM | POA: Diagnosis not present

## 2023-09-01 DIAGNOSIS — A419 Sepsis, unspecified organism: Secondary | ICD-10-CM | POA: Diagnosis not present

## 2023-09-01 DIAGNOSIS — Z91040 Latex allergy status: Secondary | ICD-10-CM | POA: Diagnosis not present

## 2023-09-01 DIAGNOSIS — Z1152 Encounter for screening for COVID-19: Secondary | ICD-10-CM | POA: Diagnosis not present

## 2023-09-01 DIAGNOSIS — R404 Transient alteration of awareness: Secondary | ICD-10-CM | POA: Diagnosis not present

## 2023-09-01 DIAGNOSIS — Z791 Long term (current) use of non-steroidal anti-inflammatories (NSAID): Secondary | ICD-10-CM | POA: Diagnosis not present

## 2023-09-01 DIAGNOSIS — G629 Polyneuropathy, unspecified: Secondary | ICD-10-CM | POA: Diagnosis not present

## 2023-09-01 DIAGNOSIS — R5381 Other malaise: Secondary | ICD-10-CM | POA: Diagnosis not present

## 2023-09-01 DIAGNOSIS — N393 Stress incontinence (female) (male): Secondary | ICD-10-CM | POA: Diagnosis not present

## 2023-09-01 DIAGNOSIS — Z885 Allergy status to narcotic agent status: Secondary | ICD-10-CM | POA: Diagnosis not present

## 2023-09-01 DIAGNOSIS — B960 Mycoplasma pneumoniae [M. pneumoniae] as the cause of diseases classified elsewhere: Secondary | ICD-10-CM | POA: Diagnosis not present

## 2023-09-01 DIAGNOSIS — E1140 Type 2 diabetes mellitus with diabetic neuropathy, unspecified: Secondary | ICD-10-CM | POA: Diagnosis not present

## 2023-09-01 DIAGNOSIS — E785 Hyperlipidemia, unspecified: Secondary | ICD-10-CM | POA: Diagnosis not present

## 2023-09-01 DIAGNOSIS — I779 Disorder of arteries and arterioles, unspecified: Secondary | ICD-10-CM | POA: Diagnosis not present

## 2023-09-01 DIAGNOSIS — R059 Cough, unspecified: Secondary | ICD-10-CM | POA: Diagnosis not present

## 2023-09-01 DIAGNOSIS — A4159 Other Gram-negative sepsis: Secondary | ICD-10-CM | POA: Diagnosis not present

## 2023-09-01 DIAGNOSIS — R4182 Altered mental status, unspecified: Secondary | ICD-10-CM | POA: Diagnosis not present

## 2023-09-01 DIAGNOSIS — R2689 Other abnormalities of gait and mobility: Secondary | ICD-10-CM | POA: Diagnosis not present

## 2023-09-01 DIAGNOSIS — R319 Hematuria, unspecified: Secondary | ICD-10-CM | POA: Diagnosis not present

## 2023-09-01 DIAGNOSIS — R6520 Severe sepsis without septic shock: Secondary | ICD-10-CM | POA: Diagnosis not present

## 2023-09-01 DIAGNOSIS — Z7982 Long term (current) use of aspirin: Secondary | ICD-10-CM | POA: Diagnosis not present

## 2023-09-01 DIAGNOSIS — E1165 Type 2 diabetes mellitus with hyperglycemia: Secondary | ICD-10-CM | POA: Diagnosis not present

## 2023-09-01 DIAGNOSIS — E119 Type 2 diabetes mellitus without complications: Secondary | ICD-10-CM | POA: Diagnosis not present

## 2023-09-01 DIAGNOSIS — R7989 Other specified abnormal findings of blood chemistry: Secondary | ICD-10-CM | POA: Diagnosis not present

## 2023-09-01 DIAGNOSIS — F32A Depression, unspecified: Secondary | ICD-10-CM | POA: Diagnosis not present

## 2023-09-01 DIAGNOSIS — R41841 Cognitive communication deficit: Secondary | ICD-10-CM | POA: Diagnosis not present

## 2023-09-01 DIAGNOSIS — N179 Acute kidney failure, unspecified: Secondary | ICD-10-CM | POA: Diagnosis not present

## 2023-09-01 DIAGNOSIS — D6489 Other specified anemias: Secondary | ICD-10-CM | POA: Diagnosis not present

## 2023-09-01 DIAGNOSIS — Z743 Need for continuous supervision: Secondary | ICD-10-CM | POA: Diagnosis not present

## 2023-09-02 LAB — URINE CULTURE: Culture: >=100000 — AB

## 2023-09-03 ENCOUNTER — Telehealth (HOSPITAL_BASED_OUTPATIENT_CLINIC_OR_DEPARTMENT_OTHER): Payer: Self-pay | Admitting: *Deleted

## 2023-09-03 NOTE — Telephone Encounter (Signed)
Post ED Visit - Positive Culture Follow-up: Unsuccessful Patient Follow-up  Culture assessed and recommendations reviewed by:  []  Rachael Rambarger, Pharm.D. []  Celedonio Miyamoto, Pharm.D., BCPS AQ-ID []  Garvin Fila, Pharm.D., BCPS []  Georgina Pillion, Pharm.D., BCPS []  [place], [place].D., BCPS, AAHIVP []  Estella Husk, Pharm.D., BCPS, AAHIVP []  Sherlynn Carbon, PharmD []  Pollyann Samples, PharmD, BCPS  Positive urine culture  [x]  Patient discharged without antimicrobial prescription and treatment is now indicated []  Organism is resistant to prescribed ED discharge antimicrobial []  Patient with positive blood cultures Patient is to take cipro 250 mg po BID x 3 days  Arnetha Gula  Unable to contact patient after 3 attempts, letter will be sent to address on file  Bing Quarry 09/03/2023, 4:02 PM

## 2023-09-03 NOTE — Progress Notes (Deleted)
ED Antimicrobial Stewardship Positive Culture Follow Up   Yesenia Reynolds is an 82 y.o. female who presented to [facility] on 08/30/2023 with a chief complaint of  Chief Complaint  Patient presents with   Urinary Retention    Recent Results (from the past 720 hour(s))  Urine Culture     Status: Abnormal   Collection Time: 08/30/23 12:08 PM   Specimen: Urine, Clean Catch  Result Value Ref Range Status   Specimen Description   Final    URINE, CLEAN CATCH Performed at [facility], [address], [place], [place] [address]    Special Requests   Final    NONE Performed at [facility], [address], [place], [place] [address]    Culture (A)  Final    >=100,000 COLONIES/mL KLEBSIELLA PNEUMONIAE 20,000 COLONIES/mL CITROBACTER AMALONATICUS    Report Status 09/02/2023 FINAL  Final   Organism ID, Bacteria KLEBSIELLA PNEUMONIAE (A)  Final   Organism ID, Bacteria CITROBACTER AMALONATICUS (A)  Final      Susceptibility   Citrobacter amalonaticus - MIC*    CEFEPIME <=0.12 SENSITIVE Sensitive     CEFTRIAXONE 32 RESISTANT Resistant     CIPROFLOXACIN <=0.25 SENSITIVE Sensitive     GENTAMICIN <=1 SENSITIVE Sensitive     IMIPENEM <=0.25 SENSITIVE Sensitive     NITROFURANTOIN 64 INTERMEDIATE Intermediate     TRIMETH/SULFA <=20 SENSITIVE Sensitive     PIP/TAZO <=4 SENSITIVE Sensitive     * 20,000 COLONIES/mL CITROBACTER AMALONATICUS   Klebsiella pneumoniae - MIC*    AMPICILLIN RESISTANT Resistant     CEFAZOLIN <=4 SENSITIVE Sensitive     CEFEPIME <=0.12 SENSITIVE Sensitive     CEFTRIAXONE <=0.25 SENSITIVE Sensitive     CIPROFLOXACIN <=0.25 SENSITIVE Sensitive     GENTAMICIN <=1 SENSITIVE Sensitive     IMIPENEM <=0.25 SENSITIVE Sensitive     NITROFURANTOIN 64 INTERMEDIATE Intermediate     TRIMETH/SULFA <=20 SENSITIVE Sensitive     AMPICILLIN/SULBACTAM 4 SENSITIVE Sensitive     PIP/TAZO <=4 SENSITIVE Sensitive     * >=100,000 COLONIES/mL KLEBSIELLA PNEUMONIAE    []  Treated with N/A,  organism resistant to prescribed antimicrobial [x]  Patient discharged originally without antimicrobial agent and treatment is now indicated  New antibiotic prescription: ciprofloxacin 250mg  PO BID x 3 days  ED Provider: Jodi Geralds, PA-C   [name], Drake Leach 09/03/2023, 8:43 AM Clinical Pharmacist Monday - Friday phone -  [phone] Saturday - Sunday phone - [phone]

## 2023-09-08 ENCOUNTER — Other Ambulatory Visit: Payer: Medicare HMO | Admitting: Urology

## 2023-09-10 DIAGNOSIS — Z792 Long term (current) use of antibiotics: Secondary | ICD-10-CM | POA: Diagnosis not present

## 2023-09-10 DIAGNOSIS — I779 Disorder of arteries and arterioles, unspecified: Secondary | ICD-10-CM | POA: Diagnosis not present

## 2023-09-10 DIAGNOSIS — A4151 Sepsis due to Escherichia coli [E. coli]: Secondary | ICD-10-CM | POA: Diagnosis not present

## 2023-09-10 DIAGNOSIS — G9341 Metabolic encephalopathy: Secondary | ICD-10-CM | POA: Diagnosis not present

## 2023-09-10 DIAGNOSIS — N390 Urinary tract infection, site not specified: Secondary | ICD-10-CM | POA: Diagnosis not present

## 2023-09-10 DIAGNOSIS — Z743 Need for continuous supervision: Secondary | ICD-10-CM | POA: Diagnosis not present

## 2023-09-10 DIAGNOSIS — E119 Type 2 diabetes mellitus without complications: Secondary | ICD-10-CM | POA: Diagnosis not present

## 2023-09-10 DIAGNOSIS — F4320 Adjustment disorder, unspecified: Secondary | ICD-10-CM | POA: Diagnosis not present

## 2023-09-10 DIAGNOSIS — N393 Stress incontinence (female) (male): Secondary | ICD-10-CM | POA: Diagnosis not present

## 2023-09-10 DIAGNOSIS — R41841 Cognitive communication deficit: Secondary | ICD-10-CM | POA: Diagnosis not present

## 2023-09-10 DIAGNOSIS — F5105 Insomnia due to other mental disorder: Secondary | ICD-10-CM | POA: Diagnosis not present

## 2023-09-10 DIAGNOSIS — R2689 Other abnormalities of gait and mobility: Secondary | ICD-10-CM | POA: Diagnosis not present

## 2023-09-10 DIAGNOSIS — R319 Hematuria, unspecified: Secondary | ICD-10-CM | POA: Diagnosis not present

## 2023-09-10 DIAGNOSIS — F411 Generalized anxiety disorder: Secondary | ICD-10-CM | POA: Diagnosis not present

## 2023-09-10 DIAGNOSIS — F32A Depression, unspecified: Secondary | ICD-10-CM | POA: Diagnosis not present

## 2023-09-10 DIAGNOSIS — E1165 Type 2 diabetes mellitus with hyperglycemia: Secondary | ICD-10-CM | POA: Diagnosis not present

## 2023-09-10 DIAGNOSIS — F413 Other mixed anxiety disorders: Secondary | ICD-10-CM | POA: Diagnosis not present

## 2023-09-10 DIAGNOSIS — A419 Sepsis, unspecified organism: Secondary | ICD-10-CM | POA: Diagnosis not present

## 2023-09-10 DIAGNOSIS — R0981 Nasal congestion: Secondary | ICD-10-CM | POA: Diagnosis not present

## 2023-09-10 DIAGNOSIS — E875 Hyperkalemia: Secondary | ICD-10-CM | POA: Diagnosis not present

## 2023-09-10 DIAGNOSIS — R5381 Other malaise: Secondary | ICD-10-CM | POA: Diagnosis not present

## 2023-09-10 DIAGNOSIS — I10 Essential (primary) hypertension: Secondary | ICD-10-CM | POA: Diagnosis not present

## 2023-09-10 DIAGNOSIS — Z79899 Other long term (current) drug therapy: Secondary | ICD-10-CM | POA: Diagnosis not present

## 2023-09-10 DIAGNOSIS — R6520 Severe sepsis without septic shock: Secondary | ICD-10-CM | POA: Diagnosis not present

## 2023-09-10 DIAGNOSIS — E785 Hyperlipidemia, unspecified: Secondary | ICD-10-CM | POA: Diagnosis not present

## 2023-09-10 DIAGNOSIS — G629 Polyneuropathy, unspecified: Secondary | ICD-10-CM | POA: Diagnosis not present

## 2023-09-10 DIAGNOSIS — E039 Hypothyroidism, unspecified: Secondary | ICD-10-CM | POA: Diagnosis not present

## 2023-09-10 DIAGNOSIS — K5900 Constipation, unspecified: Secondary | ICD-10-CM | POA: Diagnosis not present

## 2023-09-10 DIAGNOSIS — N3000 Acute cystitis without hematuria: Secondary | ICD-10-CM | POA: Diagnosis not present

## 2023-09-10 DIAGNOSIS — D6489 Other specified anemias: Secondary | ICD-10-CM | POA: Diagnosis not present

## 2023-09-11 DIAGNOSIS — N390 Urinary tract infection, site not specified: Secondary | ICD-10-CM | POA: Diagnosis not present

## 2023-09-11 DIAGNOSIS — A4151 Sepsis due to Escherichia coli [E. coli]: Secondary | ICD-10-CM | POA: Diagnosis not present

## 2023-09-15 DIAGNOSIS — N390 Urinary tract infection, site not specified: Secondary | ICD-10-CM | POA: Diagnosis not present

## 2023-09-16 ENCOUNTER — Telehealth (HOSPITAL_BASED_OUTPATIENT_CLINIC_OR_DEPARTMENT_OTHER): Payer: Self-pay | Admitting: *Deleted

## 2023-09-17 NOTE — Telephone Encounter (Signed)
Post ED Visit - Positive Culture Follow-up: Successful Patient Follow-Up  Culture assessed and recommendations reviewed by:  []  Enzo Bi, Pharm.D. []  Celedonio Miyamoto, Pharm.D., BCPS AQ-ID []  Garvin Fila, Pharm.D., BCPS []  Georgina Pillion, [address].D., BCPS []  [place], [place].D., BCPS, AAHIVP []  Estella Husk, Pharm.D., BCPS, AAHIVP [x]  Lysle Pearl, PharmD, BCPS []  Phillips Climes, PharmD, BCPS []  Agapito Games, PharmD, BCPS []  Verlan Friends, PharmD  Positive urine culture  []  Patient discharged without antimicrobial prescription and treatment is now indicated []  Organism is resistant to prescribed ED discharge antimicrobial []  Patient with positive blood cultures  Plan: Cipro 250 mg po BID x 3 days per ED provider Jodi Geralds, PA.   Pt called back and stated they have already been treated and did not need new antibiotics.   Contacted patient, date 09/16/2023, time 2:00 pm   Sandria Senter 09/17/2023, 11:14 AM

## 2023-09-22 ENCOUNTER — Telehealth (HOSPITAL_COMMUNITY): Payer: Medicare HMO | Admitting: Psychiatry

## 2023-09-28 ENCOUNTER — Other Ambulatory Visit: Payer: Medicare HMO | Admitting: Urology

## 2023-09-30 DIAGNOSIS — A419 Sepsis, unspecified organism: Secondary | ICD-10-CM | POA: Diagnosis not present

## 2023-10-12 NOTE — Progress Notes (Unsigned)
Name: Yesenia Reynolds DOB: [birth year] MRN: [id number]  History of Present Illness: Yesenia Reynolds is a 82 y.o. female who presents today for follow up visit at [facility]. - GU history: 1. Recurrent UTls. 2. Vaginal atrophy.  3. Voiding dysfunction with urinary hesitancy and incomplete bladder emptying.  4. Stress urinary incontinence.  5. Urge urinary incontinence.  - Not a safe candidate for anticholinergic OAB medications due to age and glaucoma.  At last visit with Dr. Retta Diones on 07/20/2023: - Advised patient to start topical vaginal estrogen cream use for UTI prevention.  Since last visit: > 07/22/2023: CT showed no GU stones, masses, or hydronephrosis. Mild bladder thickening likely due to inflammation related to UTIs. Based on history I advised follow up with MD for cystoscopic evaluation.   > 08/30/2023: Seen in ER for intermittent incomplete bladder emptying / urinary retention. Both bladder scan and I&O cath showed minimal PVR. No acute changes on CBC or CMP. Urinalysis leukocytes were large white blood cells 11-20 RBC 0-5 bacteria none. Urine culture grew >100k Klebsiella pneumoniae and 20k Citrobacter amalonaticus.  > 09/01/2023 - 09/10/2023: Admitted at [facility] for urosepsis with acute renal failure. Urine and blood cultures positive for Klebsiella pneumonia 09/01/2023. Treated with IV cefepime x7 days during hospital stay then transitioned to oral Levaquin for an additional 7 days (received first dose of Levaquin 09/09/2023 and due to renal/creatinine clearance was scheduled to receive this every 48 hours). Transferred to inpatient rehab facility 09/10/2023 - 10/02/2023.  ***needs to be rescheduled for her cystoscopy ***start preventative antibiotic  Today: She reports ***  She {Actions; denies-reports:120008} increased urinary urgency, frequency, nocturia, dysuria, gross hematuria, hesitancy, straining to void, or sensations of incomplete emptying.  She  {Actions; denies-reports:120008} flank pain or abdominal pain. She {Actions; denies-reports:120008} fevers, nausea, or vomiting.  She {HAS HAS EXB:28413} been using vaginal estrogen cream at a frequency of *** time(s) per week. She {Actions; denies-reports:120008} vaginal pain, bleeding, discharge.   Fall Screening: Do you usually have a device to assist in your mobility? {yes/no:20286} ***cane / ***walker / ***wheelchair   Medications: Current Outpatient Medications  Medication Sig Dispense Refill   acetaminophen (TYLENOL 8 HOUR ARTHRITIS PAIN) 650 MG CR tablet Take 650 mg by mouth every 8 (eight) hours as needed for pain.     ALPRAZolam (XANAX) 0.25 MG tablet Take 1 tablet (0.25 mg total) by mouth 4 (four) times daily. 120 tablet 3   alum & mag hydroxide-simeth (MAALOX/MYLANTA) 200-200-20 MG/5ML suspension Take 30 mLs by mouth every 6 (six) hours as needed for indigestion or heartburn.     amoxicillin-clavulanate (AUGMENTIN) 875-125 MG tablet Take 1 tablet by mouth every 12 (twelve) hours. 14 tablet 0   aspirin 81 MG tablet Take 81 mg by mouth at bedtime.      atorvastatin (LIPITOR) 10 MG tablet Take 10 mg by mouth every evening.      busPIRone (BUSPAR) 15 MG tablet Take 1 tablet (15 mg total) by mouth 2 (two) times daily. 60 tablet 3   calcium carbonate (TUMS - DOSED IN MG ELEMENTAL CALCIUM) 500 MG chewable tablet Chew 1 tablet by mouth daily as needed for indigestion or heartburn.     estradiol (ESTRACE) 0.1 MG/GM vaginal cream Insert 1/2" in vagina 2 nights/week 42.5 g 3   ibuprofen (ADVIL,MOTRIN) 200 MG tablet Take 200 mg by mouth 2 (two) times daily as needed for moderate pain.      latanoprost (XALATAN) 0.005 % ophthalmic solution Place 1 drop  into both eyes at bedtime.     levothyroxine (SYNTHROID, LEVOTHROID) 88 MCG tablet Take 88 mcg by mouth daily before breakfast.      lisinopril (PRINIVIL,ZESTRIL) 10 MG tablet Take 10 mg by mouth every morning.      loratadine (CLARITIN) 10  MG tablet Take 10 mg by mouth daily as needed for allergies.     metFORMIN (GLUCOPHAGE) 500 MG tablet Take by mouth.     metoprolol succinate (TOPROL-XL) 25 MG 24 hr tablet take 1/2 tablet BY MOUTH every morning 3 tablet 0   metoprolol succinate (TOPROL-XL) 25 MG 24 hr tablet take 1/2 tablet BY MOUTH every morning 3 tablet 0   nitrofurantoin, macrocrystal-monohydrate, (MACROBID) 100 MG capsule Take 1 capsule (100 mg total) by mouth every 12 (twelve) hours. 14 capsule 0   polyethylene glycol (MIRALAX / GLYCOLAX) packet Take 17 g by mouth daily as needed for mild constipation.      venlafaxine XR (EFFEXOR-XR) 150 MG 24 hr capsule TAKE ONE CAPSULE BY MOUTH DAILY WITH breakast 30 capsule 3   No current facility-administered medications for this visit.    Allergies: Allergies  Allergen Reactions   Morphine And Codeine Other (See Comments)    Hypotension   Other     Preservative in latanoprost causes redness, pt uses preservative free latanoprost    Paxil [Paroxetine Hcl]     unknown   Latex Rash    Past Medical History:  Diagnosis Date   Agoraphobia with panic attacks 08/07/2016   Anxiety    Arthritis    Atrial fibrillation, currently in sinus rhythm    Cancer (HCC)    Endometrial   Depression    Endometrial cancer, FIGO stage IIIC (HCC) 08/08/2015   Essential hypertension, benign    GERD (gastroesophageal reflux disease)    Glaucoma    Hypothyroidism    Mixed hyperlipidemia    Palpitations    Spinal stenosis    Spinal stenosis of lumbar region at multiple levels 08/07/2016   Type 2 diabetes mellitus (HCC)    Uterine cancer [facility])    Past Surgical History:  Procedure Laterality Date   ABDOMINAL HYSTERECTOMY     BREAST BIOPSY     benign   CATARACT EXTRACTION W/PHACO Left 03/31/2017   Procedure: CATARACT EXTRACTION PHACO AND INTRAOCULAR LENS PLACEMENT (IOC);  Surgeon: Jethro Bolus, MD;  Location: [facility];  Service: Ophthalmology;  Laterality: Left;  CDE: 8.67   CATARACT  EXTRACTION W/PHACO Right 04/14/2017   Procedure: CATARACT EXTRACTION PHACO AND INTRAOCULAR LENS PLACEMENT RIGHT EYE;  Surgeon: Jethro Bolus, MD;  Location: [facility];  Service: Ophthalmology;  Laterality: Right;  CDE: 8.52   diabetes     Type 2   LIPOMA EXCISION     Multiple dental extractions     PORT-A-CATH REMOVAL Right 09/02/2017   Procedure: MINOR REMOVAL PORT-A-CATH;  Surgeon: Franky Macho, MD;  Location: [facility];  Service: General;  Laterality: Right;   REPLACEMENT TOTAL KNEE     TONSILLECTOMY AND ADENOIDECTOMY     TOTAL KNEE ARTHROPLASTY  08/04/2012   Procedure: TOTAL KNEE ARTHROPLASTY;  Surgeon: Loanne Drilling, MD;  Location: [facility];  Service: Orthopedics;  Laterality: Left;   Family History  Problem Relation Age of Onset   Stroke Mother    Heart failure Mother    Hypertension Father    Coronary artery disease Father    Social History   Socioeconomic History   Marital status: Single    Spouse name: Not on file  Number of children: Not on file   Years of education: Not on file   Highest education level: Not on file  Occupational History   Not on file  Tobacco Use   Smoking status: Never   Smokeless tobacco: Never  Substance and Sexual Activity   Alcohol use: No    Comment: 07/10/2016 per pt no   Drug use: No    Comment: 8/3/2017per pt no    Sexual activity: Never  Other Topics Concern   Not on file  Social History Narrative   Not on file   Social Determinants of Health   Financial Resource Strain: Not on file  Food Insecurity: No Food Insecurity (09/02/2023)   Received from [facility]   Hunger Vital Sign    Worried About Running Out of Food in the Last Year: Never true    Ran Out of Food in the Last Year: Never true  Transportation Needs: No Transportation Needs (09/18/2023)   Received from [facility] - Transportation    Lack of Transportation (Medical): No    Lack of Transportation (Non-Medical): No  Physical Activity: Not on file   Stress: Not on file  Social Connections: Not on file  Intimate Partner Violence: Not on file    Review of Systems*** Constitutional: Patient denies any unintentional weight loss or change in strength lntegumentary: Patient denies any rashes or pruritus Eyes: Patient {Actions; denies-reports:120008} dry eyes ENT: Patient {Actions; denies-reports:120008} dry mouth Cardiovascular: Patient denies chest pain or syncope Respiratory: Patient denies shortness of breath Gastrointestinal: Patient ***denies nausea, vomiting, constipation, or diarrhea Musculoskeletal: Patient denies muscle cramps or weakness Neurologic: Patient denies convulsions or seizures Allergic/Immunologic: Patient denies recent allergic reaction(s) Hematologic/Lymphatic: Patient denies bleeding tendencies Endocrine: Patient denies heat/cold intolerance  GU: As per HPI.  OBJECTIVE There were no vitals filed for this visit. There is no height or weight on file to calculate BMI.  Physical Examination*** Constitutional: No obvious distress; patient is non-toxic appearing  Cardiovascular: No visible lower extremity edema.  Respiratory: The patient does not have audible wheezing/stridor; respirations do not appear labored  Gastrointestinal: Abdomen non-distended Musculoskeletal: Normal ROM of UEs  Skin: No obvious rashes/open sores  Neurologic: CN 2-12 grossly intact Psychiatric: Answered questions appropriately with normal affect  Hematologic/Lymphatic/Immunologic: No obvious bruises or sites of spontaneous bleeding  UA: ***negative *** WBC/hpf, *** RBC/hpf, bacteria (***) PVR: *** ml  ASSESSMENT No diagnosis found. ***  Will plan for follow up in *** months / ***1 year or sooner if needed. Pt verbalized understanding and agreement. All questions were answered.  PLAN Advised the following: 1. *** 2. ***No follow-ups on file.  No orders of the defined types were placed in this encounter.   It has been  explained that the patient is to follow regularly with their PCP in addition to all other providers involved in their care and to follow instructions provided by these respective offices. Patient advised to contact urology clinic if any urologic-pertaining questions, concerns, new symptoms or problems arise in the interim period.  There are no Patient Instructions on file for this visit.  Electronically signed by:  Donnita Falls, FNP   10/12/23    3:19 PM

## 2023-10-13 ENCOUNTER — Ambulatory Visit: Payer: Medicare HMO | Admitting: Urology

## 2023-10-13 ENCOUNTER — Encounter: Payer: Self-pay | Admitting: Urology

## 2023-10-13 VITALS — BP 130/80 | HR 109 | Temp 98.7°F

## 2023-10-13 DIAGNOSIS — Z8619 Personal history of other infectious and parasitic diseases: Secondary | ICD-10-CM

## 2023-10-13 DIAGNOSIS — N952 Postmenopausal atrophic vaginitis: Secondary | ICD-10-CM | POA: Diagnosis not present

## 2023-10-13 DIAGNOSIS — N3946 Mixed incontinence: Secondary | ICD-10-CM | POA: Diagnosis not present

## 2023-10-13 DIAGNOSIS — Z09 Encounter for follow-up examination after completed treatment for conditions other than malignant neoplasm: Secondary | ICD-10-CM

## 2023-10-13 DIAGNOSIS — N390 Urinary tract infection, site not specified: Secondary | ICD-10-CM

## 2023-10-13 DIAGNOSIS — Z87440 Personal history of urinary (tract) infections: Secondary | ICD-10-CM

## 2023-10-13 LAB — BLADDER SCAN AMB NON-IMAGING: Scan Result: 3

## 2023-10-13 MED ORDER — CEPHALEXIN 250 MG PO CAPS: 250.0000 mg | ORAL_CAPSULE | Freq: Every day | ORAL | 11 refills | Status: DC

## 2023-10-13 NOTE — Patient Instructions (Signed)
Recommendations regarding UTI prevention / management:  When UTI symptoms occur: Call urology office to request order for urine culture. We recommend waiting for urine culture result prior to use of any antibiotics.  For bladder pain/ burning with urination: Over the counter Pyridium (phenazopyridine) as needed (commonly known under the "AZO" brand). No more than 3 days consecutively at a time due to risk for methemoglobinemia, liver function issues, and bone health damage with long term use of Pyridium.  Routine use for UTI prevention: - Low dose antibiotic daily for UTI prophylaxis. - Topical vaginal estrogen for vaginal atrophy. Adequate fluid intake {>1.5 liters/day) to flush out the urinary tract. - Go to the bathroom to urinate every 4-6 hours while awake to minimize urinary stasis / bacterial overgrowth in the bladder. - Proanthocyanidin (PAC) supplement 36 mg daily; must be soluble (insoluble form of PAC will be ineffective). Recommended brand: Ellura. This is an over-the-counter supplement (often must be found/ purchased online) supplement derived from cranberries with concentrated active component: Proanthocyanidin (PAC) 36 mg daily. Decreases bacterial adherence to bladder lining. Not recommended for patients with interstitial cystitis due to acidity. - Vitamin C supplement to acidify urine to minimize bacterial growth. Not recommended for patients with interstitial cystitis due to acidity. - Probiotic to maintain healthy vaginal microbiome to suppress bacteria at urethral opening. Brand recommendations: Feminine Balance (highest concentration of lactobacillus) or Hyperbiotic Pro 15.  Note for patients with diabetes: You may read about D-mannose powder for UTI prevention. That is an over the-counter supplement which decreases bacterial adherence to bladder lining. I would NOT advise that for you as a person with diabetes due to its sugar content.  

## 2023-10-13 NOTE — Progress Notes (Signed)
post void residual=3

## 2023-10-14 ENCOUNTER — Telehealth: Payer: Self-pay

## 2023-10-14 NOTE — Telephone Encounter (Signed)
Patient aware and will keep follow up as scheduled.

## 2023-10-14 NOTE — Telephone Encounter (Signed)
-----   Message from Donnita Falls sent at 10/13/2023  2:36 PM EST ----- Please let patient know that per her request Dr. Retta Diones was consulted after today's visit. He agreed with plan as discussed with one exception - he advised holding off on urodynamic testing for now. He will determine whether or not to proceed with that based on cystoscopic findings.

## 2023-10-19 NOTE — Progress Notes (Signed)
History of Present Illness: Yesenia Reynolds is a 82 y.o. year old female returns for further follow-up/management of lower urinary tract symptoms, recurrent cystitis as well as microscopic hematuria.  She has persistent urinary frequency, urgency, feeling of incomplete emptying.  She also, following being seen in August by me, had admission for urinary tract infection/urosepsis.  She has had persistent microscopic hematuria and, more than likely with infections, gross hematuria.  She did have CT hematuria protocol in August.  This revealed no renal abnormalities.  Slightly thickened bladder wall.  No hydronephrosis or urolithiasis.  Still noting occasional gross hematuria.  Still with significantly bothersome urgency frequency and urgency incontinence.  She has mobility restrictions, uses a walker.  Past Medical History:  Diagnosis Date   Agoraphobia with panic attacks 08/07/2016   Anxiety    Arthritis    Atrial fibrillation, currently in sinus rhythm    Cancer (HCC)    Endometrial   Depression    Endometrial cancer, FIGO stage IIIC (HCC) 08/08/2015   Essential hypertension, benign    GERD (gastroesophageal reflux disease)    Glaucoma    Hypothyroidism    Mixed hyperlipidemia    Palpitations    Spinal stenosis    Spinal stenosis of lumbar region at multiple levels 08/07/2016   Type 2 diabetes mellitus (HCC)    Uterine cancer [facility])     Past Surgical History:  Procedure Laterality Date   ABDOMINAL HYSTERECTOMY     BREAST BIOPSY     benign   CATARACT EXTRACTION W/PHACO Left 03/31/2017   Procedure: CATARACT EXTRACTION PHACO AND INTRAOCULAR LENS PLACEMENT (IOC);  Surgeon: Jethro Bolus, MD;  Location: [facility];  Service: Ophthalmology;  Laterality: Left;  CDE: 8.67   CATARACT EXTRACTION W/PHACO Right 04/14/2017   Procedure: CATARACT EXTRACTION PHACO AND INTRAOCULAR LENS PLACEMENT RIGHT EYE;  Surgeon: Jethro Bolus, MD;  Location: [facility];  Service: Ophthalmology;  Laterality: Right;   CDE: 8.52   diabetes     Type 2   LIPOMA EXCISION     Multiple dental extractions     PORT-A-CATH REMOVAL Right 09/02/2017   Procedure: MINOR REMOVAL PORT-A-CATH;  Surgeon: Franky Macho, MD;  Location: [facility];  Service: General;  Laterality: Right;   REPLACEMENT TOTAL KNEE     TONSILLECTOMY AND ADENOIDECTOMY     TOTAL KNEE ARTHROPLASTY  08/04/2012   Procedure: TOTAL KNEE ARTHROPLASTY;  Surgeon: Loanne Drilling, MD;  Location: [facility];  Service: Orthopedics;  Laterality: Left;    Home Medications:  (Not in a hospital admission)   Allergies:  Allergies  Allergen Reactions   Morphine And Codeine Other (See Comments)    Hypotension   Other     Preservative in latanoprost causes redness, pt uses preservative free latanoprost    Paxil [Paroxetine Hcl]     unknown   Latex Rash    Family History  Problem Relation Age of Onset   Stroke Mother    Heart failure Mother    Hypertension Father    Coronary artery disease Father     Social History:  reports that she has never smoked. She has never used smokeless tobacco. She reports that she does not drink alcohol and does not use drugs.  ROS: A complete review of systems was performed.  All systems are negative except for pertinent findings as noted.  Physical Exam:  Vital signs in last 24 hours: @VSRANGES @ General:  Alert and oriented, No acute distress HEENT: Normocephalic, atraumatic Neck: No JVD or lymphadenopathy Cardiovascular:  Regular rate  Lungs: Normal inspiratory/expiratory excursion Neurologic: Grossly intact  I have reviewed prior pt notes  I have reviewed urinalysis results  I have independently reviewed prior imaging--CT images reviewed  I have reviewed prior urine culture   Urine obtained by in/out catheterization  Impression/Assessment:  1.  Recurrent urinary tract infections, currently urine does look infected although minimal symptoms  2.  Vaginal atrophic changes, on estrogen cream  3.  Lower  urinary tract symptoms/urgency incontinence, quite bothersome  4.  Microscopic/gross hematuria with normal upper tract studies so far  Plan:  1.  I gave her samples of Gemtesa, 1 daily for 1 month  2.  I will eventually start her on methenamine hippurate 1 g twice daily once she finishes a week course of antibiotics  3.  I will have her come back in 1 week for cystoscopy  4.  Urine culture sent today  [name] 10/19/2023, 1:02 PM  Bertram Millard. Acquanetta Cabanilla MD

## 2023-10-20 ENCOUNTER — Ambulatory Visit: Payer: Medicare HMO | Admitting: Urology

## 2023-10-20 ENCOUNTER — Other Ambulatory Visit: Payer: Medicare HMO | Admitting: Urology

## 2023-10-20 VITALS — BP 179/78 | HR 108 | Ht 63.0 in | Wt 175.0 lb

## 2023-10-20 DIAGNOSIS — R3129 Other microscopic hematuria: Secondary | ICD-10-CM

## 2023-10-20 DIAGNOSIS — N3941 Urge incontinence: Secondary | ICD-10-CM

## 2023-10-20 DIAGNOSIS — N952 Postmenopausal atrophic vaginitis: Secondary | ICD-10-CM

## 2023-10-20 DIAGNOSIS — R82998 Other abnormal findings in urine: Secondary | ICD-10-CM

## 2023-10-20 DIAGNOSIS — N390 Urinary tract infection, site not specified: Secondary | ICD-10-CM | POA: Diagnosis not present

## 2023-10-20 DIAGNOSIS — Z87440 Personal history of urinary (tract) infections: Secondary | ICD-10-CM | POA: Diagnosis not present

## 2023-10-20 LAB — MICROSCOPIC EXAMINATION
RBC, Urine: >30 /[HPF] — AB (ref 0–2)
WBC, UA: >30 /[HPF] — AB (ref 0–5)

## 2023-10-20 LAB — URINALYSIS, ROUTINE W REFLEX MICROSCOPIC
Bilirubin, UA: NEGATIVE
Glucose, UA: NEGATIVE
Nitrite, UA: POSITIVE — AB
Specific Gravity, UA: 1.015 (ref 1.005–1.030)
Urobilinogen, Ur: 2 mg/dL — ABNORMAL HIGH (ref 0.2–1.0)
pH, UA: 7 (ref 5.0–7.5)

## 2023-10-20 MED ORDER — VIBEGRON 75 MG PO TABS: 1.0000 | ORAL_TABLET | Freq: Every day | ORAL | Status: DC

## 2023-10-20 MED ORDER — CIPROFLOXACIN HCL 250 MG PO TABS: 250.0000 mg | ORAL_TABLET | Freq: Two times a day (BID) | ORAL | 0 refills | Status: DC

## 2023-10-20 MED ORDER — METHENAMINE HIPPURATE 1 G PO TABS: 1.0000 g | ORAL_TABLET | Freq: Two times a day (BID) | ORAL | 11 refills | Status: DC

## 2023-10-20 NOTE — Progress Notes (Signed)
Pt is prepped for and in and out catherization. Patient was cleaned and prepped in a sterle fashion with betadine. A 14 fr catheter foley was inserted. Urine return was note 10 ml.  Performed by Guss Bunde, CMA  Urine sent MD to see after

## 2023-10-21 DIAGNOSIS — E1140 Type 2 diabetes mellitus with diabetic neuropathy, unspecified: Secondary | ICD-10-CM | POA: Diagnosis not present

## 2023-10-21 DIAGNOSIS — M6281 Muscle weakness (generalized): Secondary | ICD-10-CM | POA: Diagnosis not present

## 2023-10-21 DIAGNOSIS — N3000 Acute cystitis without hematuria: Secondary | ICD-10-CM | POA: Diagnosis not present

## 2023-10-21 DIAGNOSIS — E1165 Type 2 diabetes mellitus with hyperglycemia: Secondary | ICD-10-CM | POA: Diagnosis not present

## 2023-10-23 ENCOUNTER — Encounter (HOSPITAL_COMMUNITY): Payer: Self-pay | Admitting: Psychiatry

## 2023-10-23 ENCOUNTER — Telehealth (INDEPENDENT_AMBULATORY_CARE_PROVIDER_SITE_OTHER): Payer: Medicare HMO | Admitting: Psychiatry

## 2023-10-23 DIAGNOSIS — F329 Major depressive disorder, single episode, unspecified: Secondary | ICD-10-CM | POA: Diagnosis not present

## 2023-10-23 DIAGNOSIS — F411 Generalized anxiety disorder: Secondary | ICD-10-CM | POA: Diagnosis not present

## 2023-10-23 LAB — URINE CULTURE

## 2023-10-23 MED ORDER — BUSPIRONE HCL 15 MG PO TABS: 15.0000 mg | ORAL_TABLET | Freq: Two times a day (BID) | ORAL | 3 refills | Status: DC

## 2023-10-23 MED ORDER — VENLAFAXINE HCL ER 150 MG PO CP24: ORAL_CAPSULE | ORAL | 3 refills | Status: DC

## 2023-10-23 MED ORDER — VENLAFAXINE HCL ER 75 MG PO CP24: 75.0000 mg | ORAL_CAPSULE | Freq: Every day | ORAL | 2 refills | Status: DC

## 2023-10-23 MED ORDER — ALPRAZOLAM 0.25 MG PO TABS: 0.2500 mg | ORAL_TABLET | Freq: Four times a day (QID) | ORAL | 3 refills | Status: DC

## 2023-10-23 NOTE — Progress Notes (Signed)
Virtual Visit via Telephone Note  I connected with Yesenia Reynolds on 10/23/23 at 10:00 AM EST by telephone and verified that I am speaking with the correct person using two identifiers.  Location: Patient: home Provider: office   I discussed the limitations, risks, security and privacy concerns of performing an evaluation and management service by telephone and the availability of in person appointments. I also discussed with the patient that there may be a patient responsible charge related to this service. The patient expressed understanding and agreed to proceed.      I discussed the assessment and treatment plan with the patient. The patient was provided an opportunity to ask questions and all were answered. The patient agreed with the plan and demonstrated an understanding of the instructions.   The patient was advised to call back or seek an in-person evaluation if the symptoms worsen or if the condition fails to improve as anticipated.  I provided 20 minutes of non-face-to-face time during this encounter.   [name], MD  [facility] MD/PA/NP OP Progress Note  10/23/2023 10:30 AM Yesenia Reynolds  MRN:  [id number]  Chief Complaint:  Chief Complaint  Patient presents with   Anxiety   Depression   Follow-up   HPI: This patient is an 82 year old single white female who lives in a local rest home. 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   The patient will follow-up by phone after about 2-1/2 months.  Unfortunately she was hospitalized [facility] at the end of September 1 severe UTI with sepsis.  She also was very confused with altered mental status.  Once the infection cleared up her mental status returned to baseline.  After a 10-day hospitalization she was transferred to a skilled nursing facility for rehab for about 10 days.  She has been back to her regular rest home for about 3 weeks.  She states that the whole  experience has made her more anxious and worried.  She is closely followed by urology and is supposed to have cystoscopy this coming week.  She again developed a mild UTI and is back on Cipro which is discouraging as well.  She does have a lot of urinary leakage.  She states that she is feeling more down and anxious.  She is taking the Xanax 0.25 mg 4 times daily as well as BuSpar 15 mg twice a day.  Her Effexor XR dosage is at 150 mg so I suggested we add another 75 mg to help with her mood.  She is talking to friends every day and her family has been very helpful and supportive although her sister recently had knee replacement and cannot help her.  She is looking forward to the holidays and she denies thoughts of self-harm or suicide Visit Diagnosis:    ICD-10-CM   1. Major depression, chronic  F32.9     2. Generalized anxiety disorder  F41.1 ALPRAZolam (XANAX) 0.25 MG tablet      Past Psychiatric History: She was lysed about 8 years ago for depression and anxiety that was responsive to ECT  Past Medical History:  Past Medical History:  Diagnosis Date   Agoraphobia with panic attacks 08/07/2016   Anxiety    Arthritis    Atrial fibrillation, currently in sinus rhythm    Cancer [facility])    Endometrial   Depression    Endometrial cancer, FIGO stage IIIC (HCC) 08/08/2015   Essential  hypertension, benign    GERD (gastroesophageal reflux disease)    Glaucoma    Hypothyroidism    Mixed hyperlipidemia    Palpitations    Spinal stenosis    Spinal stenosis of lumbar region at multiple levels 08/07/2016   Type 2 diabetes mellitus (HCC)    Uterine cancer [facility])     Past Surgical History:  Procedure Laterality Date   ABDOMINAL HYSTERECTOMY     BREAST BIOPSY     benign   CATARACT EXTRACTION W/PHACO Left 03/31/2017   Procedure: CATARACT EXTRACTION PHACO AND INTRAOCULAR LENS PLACEMENT (IOC);  Surgeon: Jethro Bolus, MD;  Location: [facility];  Service: Ophthalmology;  Laterality: Left;  CDE: 8.67    CATARACT EXTRACTION W/PHACO Right 04/14/2017   Procedure: CATARACT EXTRACTION PHACO AND INTRAOCULAR LENS PLACEMENT RIGHT EYE;  Surgeon: Jethro Bolus, MD;  Location: [facility];  Service: Ophthalmology;  Laterality: Right;  CDE: 8.52   diabetes     Type 2   LIPOMA EXCISION     Multiple dental extractions     PORT-A-CATH REMOVAL Right 09/02/2017   Procedure: MINOR REMOVAL PORT-A-CATH;  Surgeon: Franky Macho, MD;  Location: [facility];  Service: General;  Laterality: Right;   REPLACEMENT TOTAL KNEE     TONSILLECTOMY AND ADENOIDECTOMY     TOTAL KNEE ARTHROPLASTY  08/04/2012   Procedure: TOTAL KNEE ARTHROPLASTY;  Surgeon: Loanne Drilling, MD;  Location: [facility];  Service: Orthopedics;  Laterality: Left;    Family Psychiatric History: See below  Family History:  Family History  Problem Relation Age of Onset   Stroke Mother    Heart failure Mother    Hypertension Father    Coronary artery disease Father     Social History:  Social History   Socioeconomic History   Marital status: Single    Spouse name: Not on file   Number of children: Not on file   Years of education: Not on file   Highest education level: Not on file  Occupational History   Not on file  Tobacco Use   Smoking status: Never   Smokeless tobacco: Never  Substance and Sexual Activity   Alcohol use: No    Comment: 07/10/2016 per pt no   Drug use: No    Comment: 8/3/2017per pt no    Sexual activity: Never  Other Topics Concern   Not on file  Social History Narrative   Not on file   Social Determinants of Health   Financial Resource Strain: Not on file  Food Insecurity: No Food Insecurity (09/02/2023)   [facility]   Hunger Vital Sign    Worried About Running Out of Food in the Last Year: Never true    Ran Out of Food in the Last Year: Never true  Transportation Needs: No Transportation Needs (09/18/2023)   Received from [facility]   PRAPARE - Transportation    Lack of Transportation  (Medical): No    Lack of Transportation (Non-Medical): No  Physical Activity: Not on file  Stress: Not on file  Social Connections: Not on file    Allergies:  Allergies  Allergen Reactions   Morphine And Codeine Other (See Comments)    Hypotension   Other     Preservative in latanoprost causes redness, pt uses preservative free latanoprost    Paxil [Paroxetine Hcl]     unknown   Latex Rash    Metabolic Disorder Labs: No results found for: "HGBA1C", "MPG" No results found for: "PROLACTIN" No  results found for: "CHOL", "TRIG", "HDL", "CHOLHDL", "VLDL", "LDLCALC" No results found for: "TSH"  Therapeutic Level Labs: No results found for: "LITHIUM" No results found for: "VALPROATE" No results found for: "CBMZ"  Current Medications: Current Outpatient Medications  Medication Sig Dispense Refill   venlafaxine XR (EFFEXOR XR) 75 MG 24 hr capsule Take 1 capsule (75 mg total) by mouth daily. 30 capsule 2   acetaminophen (TYLENOL 8 HOUR ARTHRITIS PAIN) 650 MG CR tablet Take 650 mg by mouth every 8 (eight) hours as needed for pain.     ALPRAZolam (XANAX) 0.25 MG tablet Take 1 tablet (0.25 mg total) by mouth 4 (four) times daily. 120 tablet 3   alum & mag hydroxide-simeth (MAALOX/MYLANTA) 200-200-20 MG/5ML suspension Take 30 mLs by mouth every 6 (six) hours as needed for indigestion or heartburn.     aspirin 81 MG tablet Take 81 mg by mouth at bedtime.      atorvastatin (LIPITOR) 10 MG tablet Take 10 mg by mouth every evening.      busPIRone (BUSPAR) 15 MG tablet Take 1 tablet (15 mg total) by mouth 2 (two) times daily. 60 tablet 3   calcium carbonate (TUMS - DOSED IN MG ELEMENTAL CALCIUM) 500 MG chewable tablet Chew 1 tablet by mouth daily as needed for indigestion or heartburn.     ciprofloxacin (CIPRO) 250 MG tablet Take 1 tablet (250 mg total) by mouth 2 (two) times daily. 14 tablet 0   estradiol (ESTRACE) 0.1 MG/GM vaginal cream Insert 1/2" in vagina 2 nights/week 42.5 g 3    ibuprofen (ADVIL,MOTRIN) 200 MG tablet Take 200 mg by mouth 2 (two) times daily as needed for moderate pain.      latanoprost (XALATAN) 0.005 % ophthalmic solution Place 1 drop into both eyes at bedtime.     levothyroxine (SYNTHROID, LEVOTHROID) 88 MCG tablet Take 88 mcg by mouth daily before breakfast.      lisinopril (PRINIVIL,ZESTRIL) 10 MG tablet Take 10 mg by mouth every morning.      loratadine (CLARITIN) 10 MG tablet Take 10 mg by mouth daily as needed for allergies.     metFORMIN (GLUCOPHAGE) 500 MG tablet Take by mouth.     methenamine (HIPREX) 1 g tablet Take 1 tablet (1 g total) by mouth 2 (two) times daily with a meal. 60 tablet 11   metoprolol succinate (TOPROL-XL) 25 MG 24 hr tablet take 1/2 tablet BY MOUTH every morning 3 tablet 0   metoprolol succinate (TOPROL-XL) 25 MG 24 hr tablet take 1/2 tablet BY MOUTH every morning 3 tablet 0   polyethylene glycol (MIRALAX / GLYCOLAX) packet Take 17 g by mouth daily as needed for mild constipation.      venlafaxine XR (EFFEXOR-XR) 150 MG 24 hr capsule TAKE ONE CAPSULE BY MOUTH DAILY WITH breakast 30 capsule 3   Vibegron 75 MG TABS Take 1 tablet (75 mg total) by mouth daily.     No current facility-administered medications for this visit.     Musculoskeletal: Strength & Muscle Tone: na Gait & Station: na Patient leans: N/A  Psychiatric Specialty Exam: Review of Systems  Genitourinary:  Positive for enuresis and urgency.  Psychiatric/Behavioral:  Positive for dysphoric mood. The patient is nervous/anxious.   All other systems reviewed and are negative.   There were no vitals taken for this visit.There is no height or weight on file to calculate BMI.  General Appearance: NA  Eye Contact:  NA  Speech:  Clear and Coherent  Volume:  Normal  Mood:  Anxious and Dysphoric  Affect:  NA  Thought Process:  Goal Directed  Orientation:  Full (Time, Place, and Person)  Thought Content: Rumination   Suicidal Thoughts:  No  Homicidal  Thoughts:  No  Memory:  Immediate;   Good Recent;   Good Remote;   NA  Judgement:  Good  Insight:  Good  Psychomotor Activity:  Decreased  Concentration:  Concentration: Good and Attention Span: Good  Recall:  Good  Fund of Knowledge: Good  Language: Good  Akathisia:  No  Handed:  Right  AIMS (if indicated): not done  Assets:  Communication Skills Desire for Improvement Resilience Social Support  ADL's:  Intact  Cognition: WNL  Sleep:  Good   Screenings: GAD-7    Flowsheet Row Office Visit from 01/20/2023 in [facility] [place] Counselor from 04/20/2018 in [facility] [place] Counselor from 09/15/2016 in [facility] [place]  Total GAD-7 Score 5 14 10       PHQ2-9    Flowsheet Row Office Visit from 01/20/2023 in [facility] [place] Video Visit from 07/21/2022 in [facility] at [place] Video Visit from 03/19/2022 in [facility] at [place] Video Visit from 11/26/2021 in [facility] at [place] Telemedicine from 08/05/2021 in [facility] at [facility] Total Score 2 0 0 1 0  PHQ-9 Total Score 7 -- -- -- --      Flowsheet Row ED from 08/30/2023 in [facility] Video Visit from 07/21/2022 in [facility] at [place] Video Visit from 03/19/2022 in [facility] [place] [facility]  C-SSRS RISK CATEGORY No Risk No Risk No Risk        Assessment and Plan: This patient is an 82 year old female with a history of depression and anxiety.  She is a bit more anxious and depressed after going through the hospitalization and rehab.  I would suggest increasing her Effexor XR to 225 mg daily for depression.  She will continue Xanax 0.25 mg up  to 4 times daily for anxiety and BuSpar 15 mg twice daily for anxiety.  She will return to see me in 4 weeks  Collaboration of Care: Collaboration of Care: Primary Care Provider AEB notes will be shared with PCP at patient's request  Patient/Guardian was advised Release of Information must be obtained prior to any record release in order to collaborate their care with an outside provider. Patient/Guardian was advised if they have not already done so to contact the registration department to sign all necessary forms in order for [place] to release information regarding their care.   Consent: Patient/Guardian gives verbal consent for treatment and assignment of benefits for services provided during this visit. Patient/Guardian expressed understanding and agreed to proceed.    [name], MD 10/23/2023, 10:30 AM

## 2023-10-26 ENCOUNTER — Telehealth: Payer: Self-pay

## 2023-10-26 ENCOUNTER — Other Ambulatory Visit: Payer: Self-pay | Admitting: Urology

## 2023-10-26 DIAGNOSIS — N390 Urinary tract infection, site not specified: Secondary | ICD-10-CM

## 2023-10-26 MED ORDER — AMOXICILLIN 875 MG PO TABS: 875.0000 mg | ORAL_TABLET | Freq: Two times a day (BID) | ORAL | 0 refills | Status: DC

## 2023-10-26 NOTE — Telephone Encounter (Signed)
-----   Message from Bertram Millard Dahlstedt sent at 10/26/2023  9:51 AM EST ----- Please call pt/daughter--I have sent in amox for her to start asap--ok to take 3 doses ( one asap, one tonite, 1 in am tomorrow) before coming in for scope tomorrow. ----- Message ----- From: Nell Range Lab Results In Sent: 10/20/2023   3:36 PM EST To: Marcine Matar, MD

## 2023-10-26 NOTE — Progress Notes (Signed)
History of Present Illness: Yesenia Reynolds is a 82 y.o. year old female this lady returns today for cystoscopy to evaluate microscopic hematuria as well as a history of recurrent cystitis.  She was seen last week, urine appeared infected.  Culture grew aerococcus sanguinicola.  She has been on Cipro.  Past Medical History:  Diagnosis Date   Agoraphobia with panic attacks 08/07/2016   Anxiety    Arthritis    Atrial fibrillation, currently in sinus rhythm    Cancer (HCC)    Endometrial   Depression    Endometrial cancer, FIGO stage IIIC (HCC) 08/08/2015   Essential hypertension, benign    GERD (gastroesophageal reflux disease)    Glaucoma    Hypothyroidism    Mixed hyperlipidemia    Palpitations    Spinal stenosis    Spinal stenosis of lumbar region at multiple levels 08/07/2016   Type 2 diabetes mellitus (HCC)    Uterine cancer [facility])     Past Surgical History:  Procedure Laterality Date   ABDOMINAL HYSTERECTOMY     BREAST BIOPSY     benign   CATARACT EXTRACTION W/PHACO Left 03/31/2017   Procedure: CATARACT EXTRACTION PHACO AND INTRAOCULAR LENS PLACEMENT (IOC);  Surgeon: Jethro Bolus, MD;  Location: [facility];  Service: Ophthalmology;  Laterality: Left;  CDE: 8.67   CATARACT EXTRACTION W/PHACO Right 04/14/2017   Procedure: CATARACT EXTRACTION PHACO AND INTRAOCULAR LENS PLACEMENT RIGHT EYE;  Surgeon: Jethro Bolus, MD;  Location: [facility];  Service: Ophthalmology;  Laterality: Right;  CDE: 8.52   diabetes     Type 2   LIPOMA EXCISION     Multiple dental extractions     PORT-A-CATH REMOVAL Right 09/02/2017   Procedure: MINOR REMOVAL PORT-A-CATH;  Surgeon: Franky Macho, MD;  Location: [facility];  Service: General;  Laterality: Right;   REPLACEMENT TOTAL KNEE     TONSILLECTOMY AND ADENOIDECTOMY     TOTAL KNEE ARTHROPLASTY  08/04/2012   Procedure: TOTAL KNEE ARTHROPLASTY;  Surgeon: Loanne Drilling, MD;  [facility];  Service: Orthopedics;  Laterality: Left;    Home Medications:   (Not in a hospital admission)   Allergies:  Allergies  Allergen Reactions   Morphine And Codeine Other (See Comments)    Hypotension   Other     Preservative in latanoprost causes redness, pt uses preservative free latanoprost    Paxil [Paroxetine Hcl]     unknown   Latex Rash    Family History  Problem Relation Age of Onset   Stroke Mother    Heart failure Mother    Hypertension Father    Coronary artery disease Father     Social History:  reports that she has never smoked. She has never used smokeless tobacco. She reports that she does not drink alcohol and does not use drugs.  ROS: A complete review of systems was performed.  All systems are negative except for pertinent findings as noted.  Physical Exam:  Vital signs in last 24 hours: @VSRANGES @ General:  Alert and oriented, No acute distress HEENT: Normocephalic, atraumatic Neck: No JVD or lymphadenopathy Cardiovascular: Regular rate  Lungs: Normal inspiratory/expiratory excursion Abdomen: Soft, nontender, nondistended, no abdominal masses Back: No CVA tenderness Extremities: No edema Neurologic: Grossly intact  I have reviewed prior pt notes  I have reviewed urinalysis results  I have independently reviewed prior imaging  I have reviewed prior urine culture   Indication: Scopic hematuria, recurrent UTIs  After informed consent and discussion of the procedure and its  risks, [name] was positioned and prepped in the standard fashion.  Cystoscopy was performed with a flexible cystoscope.   Findings:  Urethra: Normal Ureteral orifices: Normally positioned bilaterally.  Normal appearance. Bladder: Some sediment within the bladder.  No urothelial lesions noted.  Patchy erythematous areas consistent with chronic irritation/infections   Impression/Assessment:  1.  Microscopic hematuria with negative evaluation  2.  Recurrent UTIs  Plan:  1.  Okay to start on methenamine twice a day  long-term  2.  I will see back in about 6 weeks to recheck  3.  She will eventually need urinary cytology sent  [name] 10/26/2023, 9:44 AM  Bertram Millard. Cornelius Marullo MD

## 2023-10-26 NOTE — Telephone Encounter (Signed)
Provider made aware that patient is currently taking the antibiotic cipro.  Per provider patient is to continue the Cipro. Amoxicillin order discontinued. Patient called, made aware and voiced understanding.

## 2023-10-27 ENCOUNTER — Ambulatory Visit: Payer: Medicare HMO | Admitting: Urology

## 2023-10-27 ENCOUNTER — Encounter: Payer: Self-pay | Admitting: Urology

## 2023-10-27 VITALS — BP 182/78 | HR 114

## 2023-10-27 DIAGNOSIS — N390 Urinary tract infection, site not specified: Secondary | ICD-10-CM

## 2023-10-27 DIAGNOSIS — Z87440 Personal history of urinary (tract) infections: Secondary | ICD-10-CM

## 2023-10-27 DIAGNOSIS — Z87898 Personal history of other specified conditions: Secondary | ICD-10-CM | POA: Diagnosis not present

## 2023-10-27 DIAGNOSIS — Z09 Encounter for follow-up examination after completed treatment for conditions other than malignant neoplasm: Secondary | ICD-10-CM

## 2023-11-02 ENCOUNTER — Telehealth: Payer: Self-pay | Admitting: Urology

## 2023-11-02 NOTE — Telephone Encounter (Signed)
Pt took new medication that [place] ordered and she was miserable. Terrible stomach ache with vomiting. Please advise what she should do

## 2023-11-02 NOTE — Telephone Encounter (Signed)
Please see pt concern below and advise

## 2023-11-03 NOTE — Telephone Encounter (Signed)
Unable to reach patient by phone or leave a voicemail.  Mychart message sent to patient informing her of MD response.

## 2023-11-09 ENCOUNTER — Telehealth: Payer: Self-pay

## 2023-11-09 NOTE — Telephone Encounter (Signed)
Patient wanted to let Dr. Retta Diones know she stopped taking the Methenamine due to severe nausea.  Continuing to take [place].

## 2023-11-09 NOTE — Telephone Encounter (Signed)
Please see pt message as FYI  

## 2023-11-12 DIAGNOSIS — F411 Generalized anxiety disorder: Secondary | ICD-10-CM | POA: Diagnosis not present

## 2023-11-12 DIAGNOSIS — F325 Major depressive disorder, single episode, in full remission: Secondary | ICD-10-CM | POA: Diagnosis not present

## 2023-11-12 DIAGNOSIS — H409 Unspecified glaucoma: Secondary | ICD-10-CM | POA: Diagnosis not present

## 2023-11-12 DIAGNOSIS — I10 Essential (primary) hypertension: Secondary | ICD-10-CM | POA: Diagnosis not present

## 2023-11-12 DIAGNOSIS — E1142 Type 2 diabetes mellitus with diabetic polyneuropathy: Secondary | ICD-10-CM | POA: Diagnosis not present

## 2023-11-12 DIAGNOSIS — N3946 Mixed incontinence: Secondary | ICD-10-CM | POA: Diagnosis not present

## 2023-11-12 DIAGNOSIS — M4800 Spinal stenosis, site unspecified: Secondary | ICD-10-CM | POA: Diagnosis not present

## 2023-11-12 DIAGNOSIS — E039 Hypothyroidism, unspecified: Secondary | ICD-10-CM | POA: Diagnosis not present

## 2023-11-12 DIAGNOSIS — E785 Hyperlipidemia, unspecified: Secondary | ICD-10-CM | POA: Diagnosis not present

## 2023-11-12 DIAGNOSIS — J302 Other seasonal allergic rhinitis: Secondary | ICD-10-CM | POA: Diagnosis not present

## 2023-11-12 DIAGNOSIS — M1990 Unspecified osteoarthritis, unspecified site: Secondary | ICD-10-CM | POA: Diagnosis not present

## 2023-11-24 ENCOUNTER — Telehealth: Payer: Self-pay

## 2023-11-24 ENCOUNTER — Encounter (HOSPITAL_COMMUNITY): Payer: Self-pay | Admitting: Psychiatry

## 2023-11-24 ENCOUNTER — Telehealth (HOSPITAL_COMMUNITY): Payer: Medicare HMO | Admitting: Psychiatry

## 2023-11-24 DIAGNOSIS — F329 Major depressive disorder, single episode, unspecified: Secondary | ICD-10-CM

## 2023-11-24 DIAGNOSIS — F411 Generalized anxiety disorder: Secondary | ICD-10-CM

## 2023-11-24 MED ORDER — ALPRAZOLAM 0.25 MG PO TABS: 0.2500 mg | ORAL_TABLET | Freq: Four times a day (QID) | ORAL | 3 refills | Status: DC

## 2023-11-24 MED ORDER — VENLAFAXINE HCL ER 150 MG PO CP24: ORAL_CAPSULE | ORAL | 3 refills | Status: DC

## 2023-11-24 MED ORDER — BUSPIRONE HCL 15 MG PO TABS: 15.0000 mg | ORAL_TABLET | Freq: Two times a day (BID) | ORAL | 3 refills | Status: DC

## 2023-11-24 MED ORDER — VENLAFAXINE HCL ER 75 MG PO CP24: 75.0000 mg | ORAL_CAPSULE | Freq: Every day | ORAL | 2 refills | Status: DC

## 2023-11-24 NOTE — Progress Notes (Signed)
Virtual Visit via Telephone Note  I connected with Yesenia Reynolds on 11/24/23 at  9:20 AM EST by telephone and verified that I am speaking with the correct person using two identifiers.  Location: Patient: home Provider: office   I discussed the limitations, risks, security and privacy concerns of performing an evaluation and management service by telephone and the availability of in person appointments. I also discussed with the patient that there may be a patient responsible charge related to this service. The patient expressed understanding and agreed to proceed.      I discussed the assessment and treatment plan with the patient. The patient was provided an opportunity to ask questions and all were answered. The patient agreed with the plan and demonstrated an understanding of the instructions.   The patient was advised to call back or seek an in-person evaluation if the symptoms worsen or if the condition fails to improve as anticipated.  I provided 20 minutes of non-face-to-face time during this encounter.   [name], MD  [facility] MD/PA/NP OP Progress Note  11/24/2023 9:43 AM Yesenia Reynolds  MRN:  [id number]  Chief Complaint:  Chief Complaint  Patient presents with   Depression   Anxiety   Follow-up   HPI: This patient is an 82 year old single white female who lives in a local rest home. 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  The patient returns for follow-up by phone after 4 weeks.  Last time she got more depressed and anxious after being hospitalized for sepsis from a UTI and then having a stay in a rehab facility.  We had added more Effexor so her total dosage is 225 mg.  She is now feeling better.  She states she is less depressed and anxious.  She is back to her normal routine.  She did have cystoscopy which was not very revealing.  She is now on cranberry capsule 3 times a day.  Currently she does not have a  UTI but still has a urinary leakage.  She is going to try a toileting schedule to prevent getting another UTI.  Overall however she feels that her mood has been good and her anxiety is under good control.  She is eating and sleeping well.  She is talking to her friends frequently.  Visit Diagnosis:    ICD-10-CM   1. Major depression, chronic  F32.9     2. Generalized anxiety disorder  F41.1 ALPRAZolam (XANAX) 0.25 MG tablet      Past Psychiatric History: Patient was hospitalized about 8 years ago for depression and anxiety that was responsive to ECT  Past Medical History:  Past Medical History:  Diagnosis Date   Agoraphobia with panic attacks 08/07/2016   Anxiety    Arthritis    Atrial fibrillation, currently in sinus rhythm    Cancer (HCC)    Endometrial   Depression    Endometrial cancer, FIGO stage IIIC (HCC) 08/08/2015   Essential hypertension, benign    GERD (gastroesophageal reflux disease)    Glaucoma    Hypothyroidism    Mixed hyperlipidemia    Palpitations    Spinal stenosis    Spinal stenosis of lumbar region at multiple levels 08/07/2016   Type 2 diabetes mellitus (HCC)    Uterine cancer (HCC)     Past Surgical History:  Procedure Laterality Date   ABDOMINAL HYSTERECTOMY     BREAST BIOPSY  benign   CATARACT EXTRACTION W/PHACO Left 03/31/2017   Procedure: CATARACT EXTRACTION PHACO AND INTRAOCULAR LENS PLACEMENT (IOC);  Surgeon: Jethro Bolus, MD;  [facility]: [facility];  Service: Ophthalmology;  Laterality: Left;  CDE: 8.67   CATARACT EXTRACTION W/PHACO Right 04/14/2017   Procedure: CATARACT EXTRACTION PHACO AND INTRAOCULAR LENS PLACEMENT RIGHT EYE;  Surgeon: Jethro Bolus, MD;  Location: [facility];  Service: Ophthalmology;  Laterality: Right;  CDE: 8.52   diabetes     Type 2   LIPOMA EXCISION     Multiple dental extractions     PORT-A-CATH REMOVAL Right 09/02/2017   Procedure: MINOR REMOVAL PORT-A-CATH;  Surgeon: Franky Macho, MD;  Location: [facility];  Service:  General;  Laterality: Right;   REPLACEMENT TOTAL KNEE     TONSILLECTOMY AND ADENOIDECTOMY     TOTAL KNEE ARTHROPLASTY  08/04/2012   Procedure: TOTAL KNEE ARTHROPLASTY;  Surgeon: Loanne Drilling, MD;  Location: [facility];  Service: Orthopedics;  Laterality: Left;    Family Psychiatric History: See below  Family History:  Family History  Problem Relation Age of Onset   Stroke Mother    Heart failure Mother    Hypertension Father    Coronary artery disease Father     Social History:  Social History   Socioeconomic History   Marital status: Single    Spouse name: Not on file   Number of children: Not on file   Years of education: Not on file   Highest education level: Not on file  Occupational History   Not on file  Tobacco Use   Smoking status: Never   Smokeless tobacco: Never  Substance and Sexual Activity   Alcohol use: No    Comment: 07/10/2016 per pt no   Drug use: No    Comment: 8/3/2017per pt no    Sexual activity: Never  Other Topics Concern   Not on file  Social History Narrative   Not on file   Social Drivers of Health   Financial Resource Strain: Not on file  Food Insecurity: No Food Insecurity (09/02/2023)   Received from [facility]   Hunger Vital Sign    Worried About Running Out of Food in the Last Year: Never true    Ran Out of Food in the Last Year: Never true  Transportation Needs: No Transportation Needs (09/18/2023)   [facility] from [facility]   PRAPARE - Transportation    Lack of Transportation (Medical): No    Lack of Transportation (Non-Medical): No  Physical Activity: Not on file  Stress: Not on file  Social Connections: Not on file    Allergies:  Allergies  Allergen Reactions   Morphine And Codeine Other (See Comments)    Hypotension   Other     Preservative in latanoprost causes redness, pt uses preservative free latanoprost    Paxil [Paroxetine Hcl]     unknown   Latex Rash    Metabolic Disorder Labs: No results found  for: "HGBA1C", "MPG" No results found for: "PROLACTIN" No results found for: "CHOL", "TRIG", "HDL", "CHOLHDL", "VLDL", "LDLCALC" No results found for: "TSH"  Therapeutic Level Labs: No results found for: "LITHIUM" No results found for: "VALPROATE" No results found for: "CBMZ"  Current Medications: Current Outpatient Medications  Medication Sig Dispense Refill   acetaminophen (TYLENOL 8 HOUR ARTHRITIS PAIN) 650 MG CR tablet Take 650 mg by mouth every 8 (eight) hours as needed for pain.     ALPRAZolam (XANAX) 0.25 MG tablet Take 1 tablet (  0.25 mg total) by mouth 4 (four) times daily. 120 tablet 3   alum & mag hydroxide-simeth (MAALOX/MYLANTA) 200-200-20 MG/5ML suspension Take 30 mLs by mouth every 6 (six) hours as needed for indigestion or heartburn.     aspirin 81 MG tablet Take 81 mg by mouth at bedtime.      atorvastatin (LIPITOR) 10 MG tablet Take 10 mg by mouth every evening.      busPIRone (BUSPAR) 15 MG tablet Take 1 tablet (15 mg total) by mouth 2 (two) times daily. 60 tablet 3   calcium carbonate (TUMS - DOSED IN MG ELEMENTAL CALCIUM) 500 MG chewable tablet Chew 1 tablet by mouth daily as needed for indigestion or heartburn.     ciprofloxacin (CIPRO) 250 MG tablet Take 1 tablet (250 mg total) by mouth 2 (two) times daily. 14 tablet 0   estradiol (ESTRACE) 0.1 MG/GM vaginal cream Insert 1/2" in vagina 2 nights/week 42.5 g 3   ibuprofen (ADVIL,MOTRIN) 200 MG tablet Take 200 mg by mouth 2 (two) times daily as needed for moderate pain.      latanoprost (XALATAN) 0.005 % ophthalmic solution Place 1 drop into both eyes at bedtime.     levothyroxine (SYNTHROID, LEVOTHROID) 88 MCG tablet Take 88 mcg by mouth daily before breakfast.      lisinopril (PRINIVIL,ZESTRIL) 10 MG tablet Take 10 mg by mouth every morning.      loratadine (CLARITIN) 10 MG tablet Take 10 mg by mouth daily as needed for allergies.     metFORMIN (GLUCOPHAGE) 500 MG tablet Take by mouth.     methenamine (HIPREX) 1 g  tablet Take 1 tablet (1 g total) by mouth 2 (two) times daily with a meal. 60 tablet 11   metoprolol succinate (TOPROL-XL) 25 MG 24 hr tablet take 1/2 tablet BY MOUTH every morning 3 tablet 0   metoprolol succinate (TOPROL-XL) 25 MG 24 hr tablet take 1/2 tablet BY MOUTH every morning 3 tablet 0   polyethylene glycol (MIRALAX / GLYCOLAX) packet Take 17 g by mouth daily as needed for mild constipation.      venlafaxine XR (EFFEXOR XR) 75 MG 24 hr capsule Take 1 capsule (75 mg total) by mouth daily. 30 capsule 2   venlafaxine XR (EFFEXOR-XR) 150 MG 24 hr capsule TAKE ONE CAPSULE BY MOUTH DAILY WITH breakast 30 capsule 3   Vibegron 75 MG TABS Take 1 tablet (75 mg total) by mouth daily.     No current facility-administered medications for this visit.     Musculoskeletal: Strength & Muscle Tone: na Gait & Station: na Patient leans: N/A  Psychiatric Specialty Exam: Review of Systems  Genitourinary:  Positive for frequency.  Musculoskeletal:  Positive for arthralgias.  All other systems reviewed and are negative.   There were no vitals taken for this visit.There is no height or weight on file to calculate BMI.  General Appearance: NA  Eye Contact:  NA  Speech:  Clear and Coherent  Volume:  Normal  Mood:  Euthymic  Affect:  NA  Thought Process:  Goal Directed  Orientation:  Full (Time, Place, and Person)  Thought Content: WDL   Suicidal Thoughts:  No  Homicidal Thoughts:  No  Memory:  Immediate;   Good Recent;   Fair Remote;   NA  Judgement:  Good  Insight:  Fair  Psychomotor Activity:  Decreased  Concentration:  Concentration: Fair and Attention Span: Fair  Recall:  Fair  Fund of Knowledge: Good  Language: Good  Akathisia:  No  Handed:  Right  AIMS (if indicated): not done  Assets:  Communication Skills Desire for Improvement Resilience Social Support  ADL's:  Intact  Cognition: WNL  Sleep:  Good   Screenings: GAD-7    Flowsheet Row Office Visit from 01/20/2023 in  [facility] [place] Counselor from 04/20/2018 in [facility] [place] Counselor from 09/15/2016 in [facility] [place]  Total GAD-7 Score 5 14 10       PHQ2-9    Flowsheet Row Office Visit from 01/20/2023 in [facility] [place] Video Visit from 07/21/2022 in [facility] at [place] Video Visit from 03/19/2022 in [facility] at [place] Video Visit from 11/26/2021 in [facility] at [place] Telemedicine from 08/05/2021 in [facility] at [facility] Total Score 2 0 0 1 0  PHQ-9 Total Score 7 -- -- -- --      Flowsheet Row ED from 08/30/2023 in [facility] at [facility] Video Visit from 07/21/2022 in [facility] at [place] Video Visit from 03/19/2022 in [facility] at [place]  C-SSRS RISK CATEGORY No Risk No Risk No Risk        Assessment and Plan: This patient is an 82 year old female with a history of depression and anxiety.  She is doing well on her current regimen.  She will continue Effexor XR 225 mg daily for depression, Xanax 0.25 mg up to 4 times daily as needed for anxiety and BuSpar 15 mg twice daily for anxiety.  She will return to see me in 3 months  Collaboration of Care: Collaboration of Care: Primary Care Provider AEB notes will be shared with PCP at patient's request  Patient/Guardian was advised Release of Information must be obtained prior to any record release in order to collaborate their care with an outside provider. Patient/Guardian was advised if they have not already done so to contact the registration department to sign all necessary forms in order for [place] to release information regarding their care.   Consent:  Patient/Guardian gives verbal consent for treatment and assignment of benefits for services provided during this visit. Patient/Guardian expressed understanding and agreed to proceed.    [name], MD 11/24/2023, 9:43 AM

## 2023-11-24 NOTE — Telephone Encounter (Signed)
Pt called to verify that UZO cranberry was the correct supplement. Per MD its fine

## 2023-12-11 ENCOUNTER — Telehealth: Payer: Self-pay | Admitting: Urology

## 2023-12-11 DIAGNOSIS — N3941 Urge incontinence: Secondary | ICD-10-CM

## 2023-12-11 MED ORDER — VIBEGRON 75 MG PO TABS: 1.0000 | ORAL_TABLET | Freq: Every day | ORAL | Status: AC

## 2023-12-11 NOTE — Telephone Encounter (Signed)
 Patient called she would like some samples of Gemtesa when they come in, she would also like a RX called into [facility]  for her to pick up  she is out.

## 2023-12-11 NOTE — Telephone Encounter (Signed)
 Patient is made aware and voiced understanding.

## 2023-12-14 NOTE — Progress Notes (Signed)
 history of Present Illness: Yesenia Reynolds is a 83 y.o. year old female this lady returns today for follow up of microscopic hematuria (eval negative) as well as chronic UTIs.  At her last visit a few weeks ago she was placed on suppressive methenamine .  It was recommended that she have a urine cytology performed once there was no infection evident.  She only took 1 methenamine  tablet which produced nausea and a stomachache.  She will given an antibiotic for mild mental status change back around November by her PCP.  Urine was not provided at that visit.  Currently no dysuria, gross hematuria.  Still on prescribed dose of estrogen cream.   Past Medical History:  Diagnosis Date   Agoraphobia with panic attacks 08/07/2016   Anxiety    Arthritis    Atrial fibrillation, currently in sinus rhythm    Cancer (HCC)    Endometrial   Depression    Endometrial cancer, FIGO stage IIIC (HCC) 08/08/2015   Essential hypertension, benign    GERD (gastroesophageal reflux disease)    Glaucoma    Hypothyroidism    Mixed hyperlipidemia    Palpitations    Spinal stenosis    Spinal stenosis of lumbar region at multiple levels 08/07/2016   Type 2 diabetes mellitus (HCC)    Uterine cancer [facility])     Past Surgical History:  Procedure Laterality Date   ABDOMINAL HYSTERECTOMY     BREAST BIOPSY     benign   CATARACT EXTRACTION W/PHACO Left 03/31/2017   Procedure: CATARACT EXTRACTION PHACO AND INTRAOCULAR LENS PLACEMENT (IOC);  Surgeon: Oneil Platts, MD;  Location: [facility];  Service: Ophthalmology;  Laterality: Left;  CDE: 8.67   CATARACT EXTRACTION W/PHACO Right 04/14/2017   Procedure: CATARACT EXTRACTION PHACO AND INTRAOCULAR LENS PLACEMENT RIGHT EYE;  Surgeon: Platts Oneil, MD;  Location: [facility];  Service: Ophthalmology;  Laterality: Right;  CDE: 8.52   diabetes     Type 2   LIPOMA EXCISION     Multiple dental extractions     PORT-A-CATH REMOVAL Right 09/02/2017   Procedure: MINOR REMOVAL  PORT-A-CATH;  Surgeon: Mavis Oneil, MD;  Location: [facility];  Service: General;  Laterality: Right;   REPLACEMENT TOTAL KNEE     TONSILLECTOMY AND ADENOIDECTOMY     TOTAL KNEE ARTHROPLASTY  08/04/2012   Procedure: TOTAL KNEE ARTHROPLASTY;  Surgeon: Dempsey LULLA Moan, MD;  Location: [facility];  Service: Orthopedics;  Laterality: Left;    Home Medications:  (Not in a hospital admission)   Allergies:  Allergies  Allergen Reactions   Morphine  And Codeine Other (See Comments)    Hypotension   Other     Preservative in latanoprost causes redness, pt uses preservative free latanoprost    Paxil [Paroxetine Hcl]     unknown   Latex Rash    Family History  Problem Relation Age of Onset   Stroke Mother    Heart failure Mother    Hypertension Father    Coronary artery disease Father     Social History:  reports that she has never smoked. She has never used smokeless tobacco. She reports that she does not drink alcohol  and does not use drugs.  ROS: A complete review of systems was performed.  All systems are negative except for pertinent findings as noted.  Physical Exam:  Vital signs in last 24 hours: @VSRANGES @ General:  Alert and oriented, No acute distress HEENT: Normocephalic, atraumatic Neck: No JVD or lymphadenopathy Cardiovascular: Regular rate  Lungs: Normal  inspiratory/expiratory excursion Extremities: No edema Neurologic: Grossly intact  In-N-Out cath done for urine specimen  I have reviewed prior pt notes  I have reviewed urinalysis results  I have independently reviewed prior imaging--CT results  Urinalysis today clear  Residual urine volume 30 mL       Impression/Assessment:  1.  Microscopic hematuria with negative evaluation  2.  Recurrent UTIs--doing well with the estrogen cream.  Did not tolerate the 1000 mg of the methenamine   Plan:  1.  Continue the estrogen cream 2-3 nights a week.  She is also on Gemtesa , more samples were given--she can take  that every other day  2.  I told her to take half a methenamine  tablet to see if she tolerates that dose.  If so, continue half twice a day  3.  I will send urine for cytology  4.  I will see her back in 3 months.  She was provided 2-3 sample bottles for urinalyses if infection suspected, for drop off   Garnette HERO Sayda Grable 12/14/2023, 11:21 AM  Garnette HERO. Merrianne Mccumbers MD

## 2023-12-15 ENCOUNTER — Encounter: Payer: Self-pay | Admitting: Urology

## 2023-12-15 ENCOUNTER — Ambulatory Visit (INDEPENDENT_AMBULATORY_CARE_PROVIDER_SITE_OTHER): Payer: Medicare HMO | Admitting: Urology

## 2023-12-15 VITALS — BP 180/80 | HR 101

## 2023-12-15 DIAGNOSIS — N952 Postmenopausal atrophic vaginitis: Secondary | ICD-10-CM

## 2023-12-15 DIAGNOSIS — R3129 Other microscopic hematuria: Secondary | ICD-10-CM

## 2023-12-15 DIAGNOSIS — N390 Urinary tract infection, site not specified: Secondary | ICD-10-CM

## 2023-12-15 DIAGNOSIS — Z87898 Personal history of other specified conditions: Secondary | ICD-10-CM | POA: Diagnosis not present

## 2023-12-15 DIAGNOSIS — H401133 Primary open-angle glaucoma, bilateral, severe stage: Secondary | ICD-10-CM | POA: Diagnosis not present

## 2023-12-15 DIAGNOSIS — Z961 Presence of intraocular lens: Secondary | ICD-10-CM | POA: Diagnosis not present

## 2023-12-15 DIAGNOSIS — H1713 Central corneal opacity, bilateral: Secondary | ICD-10-CM | POA: Diagnosis not present

## 2023-12-15 DIAGNOSIS — H47323 Drusen of optic disc, bilateral: Secondary | ICD-10-CM | POA: Diagnosis not present

## 2023-12-15 DIAGNOSIS — R8289 Other abnormal findings on cytological and histological examination of urine: Secondary | ICD-10-CM | POA: Diagnosis not present

## 2023-12-15 DIAGNOSIS — Z87440 Personal history of urinary (tract) infections: Secondary | ICD-10-CM

## 2023-12-15 NOTE — Progress Notes (Signed)
 Pt is prepped for and in and out catherization. Patient was cleaned and prepped in a sterle fashion with betadine. A 14 fr catheter foley was inserted. Urine return was note 30 ml.  Performed by [facility] LPN  Urine sent per MD note

## 2023-12-16 LAB — MICROSCOPIC EXAMINATION

## 2023-12-16 LAB — URINALYSIS, ROUTINE W REFLEX MICROSCOPIC
Bilirubin, UA: NEGATIVE
Glucose, UA: NEGATIVE
Ketones, UA: NEGATIVE
Nitrite, UA: POSITIVE — AB
Protein,UA: NEGATIVE
RBC, UA: NEGATIVE
Specific Gravity, UA: 1.02 (ref 1.005–1.030)
Urobilinogen, Ur: 0.2 mg/dL (ref 0.2–1.0)
pH, UA: 7 (ref 5.0–7.5)

## 2023-12-16 LAB — CYTOLOGY, URINE

## 2023-12-31 ENCOUNTER — Telehealth: Payer: Self-pay

## 2023-12-31 NOTE — Telephone Encounter (Signed)
Per Dahlstedt "Please call patient-she did not get the cytology results on MyChart.  Tell her that the urine cytology showed some atypical cells, but this is not unusual.  No cancer cells were seen.  We will be seeing her at her regular appointment in April." Patient states that Methenamine is cause her diarrhea and she was inform that this is a side effect from this medication. Patient is made aware a message will be sent to Dr. Retta Diones on another alternative. Patient voiced understanding.

## 2024-01-01 NOTE — Telephone Encounter (Signed)
Per Dr. Retta Diones " Tell her it is OK to stop methenamine. Replace with cranberry capsules 3 times a day " Tried calling patient with no answer, left vm for return call.

## 2024-01-04 NOTE — Telephone Encounter (Signed)
Tried calling patient with no answer, left vm for return call

## 2024-01-05 NOTE — Telephone Encounter (Signed)
Letter mailed out.

## 2024-01-06 NOTE — Telephone Encounter (Signed)
Patient left a voice message 01-06-2024.  Returned missed call.

## 2024-01-07 ENCOUNTER — Other Ambulatory Visit: Payer: Self-pay | Admitting: Urology

## 2024-01-07 ENCOUNTER — Other Ambulatory Visit: Payer: Medicare HMO

## 2024-01-07 ENCOUNTER — Telehealth: Payer: Self-pay

## 2024-01-07 DIAGNOSIS — R399 Unspecified symptoms and signs involving the genitourinary system: Secondary | ICD-10-CM

## 2024-01-07 DIAGNOSIS — R8290 Unspecified abnormal findings in urine: Secondary | ICD-10-CM

## 2024-01-07 DIAGNOSIS — N390 Urinary tract infection, site not specified: Secondary | ICD-10-CM

## 2024-01-07 MED ORDER — CEPHALEXIN 500 MG PO CAPS: 500.0000 mg | ORAL_CAPSULE | Freq: Two times a day (BID) | ORAL | 0 refills | Status: AC

## 2024-01-07 NOTE — Progress Notes (Signed)
Please see urine lab drop off. Per Dr. Lenoria Chime last ov note see below,  Plan:  1.  Continue the estrogen cream 2-3 nights a week.  She is also on Gemtesa, more samples were given--she can take that every other day   2.  I told her to take half a methenamine tablet to see if she tolerates that dose.  If so, continue half twice a day   3.  I will send urine for cytology   4.  I will see her back in 3 months.  She was provided 2-3 sample bottles for urinalyses if infection suspected, for drop off     Yesenia Reynolds 12/14/2023, 11:21 AM  Yesenia Millard. Dahlstedt MD

## 2024-01-07 NOTE — Telephone Encounter (Signed)
Please review lab visit urine drop off per Dr. Retta Diones last office note.

## 2024-01-07 NOTE — Telephone Encounter (Signed)
Patient dropped off a urine specimen, per Dr. Retta Diones.  Advised 2 or 3 UA drop off prior to next visit.

## 2024-01-08 ENCOUNTER — Telehealth: Payer: Self-pay

## 2024-01-08 LAB — MICROSCOPIC EXAMINATION: WBC, UA: >30 /[HPF] — AB (ref 0–5)

## 2024-01-08 LAB — URINALYSIS, ROUTINE W REFLEX MICROSCOPIC
Bilirubin, UA: NEGATIVE
Glucose, UA: NEGATIVE
Ketones, UA: NEGATIVE
Nitrite, UA: POSITIVE — AB
Protein,UA: NEGATIVE
RBC, UA: NEGATIVE
Specific Gravity, UA: 1.015 (ref 1.005–1.030)
Urobilinogen, Ur: 0.2 mg/dL (ref 0.2–1.0)
pH, UA: 6 (ref 5.0–7.5)

## 2024-01-08 NOTE — Telephone Encounter (Signed)
Patient called and made aware.

## 2024-01-08 NOTE — Telephone Encounter (Signed)
-----   Message from Bertram Millard Dahlstedt sent at 01/08/2024  9:36 AM EST ----- Have her stop it--she should only take it if she  has sx's. Tell her cups are to be used only if she thinks that she has a uti ----- Message ----- From: Gustavus Messing, LPN Sent: 9/60/4540   9:34 AM EST To: Marcine Matar, MD  Good morning. I called to confirm if patient had any UTI symptoms and she stated she did not. I did not take the initial call just followed up on the lab drop off. Do you wish for her to continue on the keflex? ----- Message ----- From: Marcine Matar, MD Sent: 01/08/2024   8:44 AM EST To: Gustavus Messing, LPN  Hey--saw her ua. Is she having sx's of a uti? I told her that she only needed to drop a specimen off if she felt that she had one.

## 2024-01-08 NOTE — Telephone Encounter (Signed)
Patient called and made aware to stop the keflex.   Patient also made aware to only drop off a urine specimen if she is having symptoms.  Patient voiced understanding.

## 2024-01-13 LAB — URINE CULTURE

## 2024-01-18 ENCOUNTER — Telehealth: Payer: Self-pay | Admitting: Urology

## 2024-01-18 NOTE — Telephone Encounter (Signed)
 Samples never pcked up , sent back to clinic they have been in basket for 1 month.

## 2024-01-25 ENCOUNTER — Telehealth: Payer: Self-pay

## 2024-01-25 NOTE — Telephone Encounter (Signed)
 Called Pt to relay message from previous telephone encounter from MD lvm to call back no active DPR

## 2024-01-25 NOTE — Telephone Encounter (Signed)
-----   Message from Nurse [facility] sent at 01/25/2024  2:27 PM EST -----  ----- Message ----- From: Marcine Matar, MD Sent: 01/25/2024  12:52 PM EST To: Gustavus Messing, LPN; Troy Sine, CMA; #  Please notify the patient patient  that her culture showed that the Keflex worked fine for the bacteria. ----- Message ----- From: Nell Range Lab Results In Sent: 01/08/2024   5:38 AM EST To: Marcine Matar, MD

## 2024-02-02 DIAGNOSIS — M06061 Rheumatoid arthritis without rheumatoid factor, right knee: Secondary | ICD-10-CM | POA: Diagnosis not present

## 2024-02-02 DIAGNOSIS — M5136 Other intervertebral disc degeneration, lumbar region with discogenic back pain only: Secondary | ICD-10-CM | POA: Diagnosis not present

## 2024-02-02 DIAGNOSIS — M06062 Rheumatoid arthritis without rheumatoid factor, left knee: Secondary | ICD-10-CM | POA: Diagnosis not present

## 2024-02-08 ENCOUNTER — Telehealth: Payer: Self-pay

## 2024-02-08 NOTE — Telephone Encounter (Signed)
 Pt called wanting to know why she was previously called per last office note Pt was called to let her know her urine culture was fine after her antibiotic

## 2024-02-15 DIAGNOSIS — M545 Low back pain, unspecified: Secondary | ICD-10-CM | POA: Diagnosis not present

## 2024-02-17 ENCOUNTER — Telehealth (HOSPITAL_COMMUNITY): Payer: Medicare HMO | Admitting: Psychiatry

## 2024-02-19 ENCOUNTER — Encounter (HOSPITAL_COMMUNITY): Payer: Self-pay

## 2024-02-19 ENCOUNTER — Telehealth (HOSPITAL_COMMUNITY): Admitting: Psychiatry

## 2024-02-22 ENCOUNTER — Telehealth: Payer: Self-pay

## 2024-02-22 NOTE — Telephone Encounter (Signed)
 Pt called to ask if she could get some Gemtesa samples we let her know we put a 4 pack up front

## 2024-02-24 ENCOUNTER — Telehealth (INDEPENDENT_AMBULATORY_CARE_PROVIDER_SITE_OTHER): Admitting: Psychiatry

## 2024-02-24 ENCOUNTER — Encounter (HOSPITAL_COMMUNITY): Payer: Self-pay | Admitting: Psychiatry

## 2024-02-24 DIAGNOSIS — F329 Major depressive disorder, single episode, unspecified: Secondary | ICD-10-CM | POA: Diagnosis not present

## 2024-02-24 DIAGNOSIS — F411 Generalized anxiety disorder: Secondary | ICD-10-CM | POA: Diagnosis not present

## 2024-02-24 MED ORDER — VENLAFAXINE HCL ER 75 MG PO CP24: 75.0000 mg | ORAL_CAPSULE | Freq: Every day | ORAL | 2 refills | Status: DC

## 2024-02-24 MED ORDER — ALPRAZOLAM 0.25 MG PO TABS: 0.2500 mg | ORAL_TABLET | Freq: Four times a day (QID) | ORAL | 3 refills | Status: DC

## 2024-02-24 MED ORDER — BUSPIRONE HCL 15 MG PO TABS: 15.0000 mg | ORAL_TABLET | Freq: Two times a day (BID) | ORAL | 3 refills | Status: DC

## 2024-02-24 MED ORDER — VENLAFAXINE HCL ER 150 MG PO CP24: ORAL_CAPSULE | ORAL | 3 refills | Status: DC

## 2024-02-24 NOTE — Progress Notes (Signed)
 Virtual Visit via Telephone Note  I connected with Yesenia Reynolds on 02/24/24 at  9:40 AM EDT by telephone and verified that I am speaking with the correct person using two identifiers.  Location: Patient: home Provider: office   I discussed the limitations, risks, security and privacy concerns of performing an evaluation and management service by telephone and the availability of in person appointments. I also discussed with the patient that there may be a patient responsible charge related to this service. The patient expressed understanding and agreed to proceed.      I discussed the assessment and treatment plan with the patient. The patient was provided an opportunity to ask questions and all were answered. The patient agreed with the plan and demonstrated an understanding of the instructions.   The patient was advised to call back or seek an in-person evaluation if the symptoms worsen or if the condition fails to improve as anticipated.  I provided 20 minutes of non-face-to-face time during this encounter.   [name], MD  [facility] MD/PA/NP OP Progress Note  02/24/2024 9:53 AM Yesenia Reynolds  MRN:  [id number]  Chief Complaint:  Chief Complaint  Patient presents with   Anxiety   Depression   Follow-up   HPI: This patient is an 83 year old single white female who lives in a local rest home. 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   The patient returns for follow-up After 3 Months regarding Her Depression and Anxiety.  She States That Overall She Is Doing Well.  She Is Still Having Difficulties with Urinary Leakage but Other Than That Her Health Has Been Good.  She Has Had No Further UTIs.  She States That Her Mood Is Good and She Is Enjoying Watching the [employer] on [address].  She Still Talks to Friends and Occasionally Gets out with Friends.  She Is Eating Well and Her Mood Has Been Stable.  She Denies Any Thoughts  of Suicide.  She Is Sleeping Well.  She Denies Significant Anxiety Visit Diagnosis:    ICD-10-CM   1. Major depression, chronic  F32.9     2. Generalized anxiety disorder  F41.1 ALPRAZolam (XANAX) 0.25 MG tablet      Past Psychiatric History: Patient was hospitalized about 8 years ago for depression and anxiety that was responsive to ECT  Past Medical History:  Past Medical History:  Diagnosis Date   Agoraphobia with panic attacks 08/07/2016   Anxiety    Arthritis    Atrial fibrillation, currently in sinus rhythm    Cancer (HCC)    Endometrial   Depression    Endometrial cancer, FIGO stage IIIC (HCC) 08/08/2015   Essential hypertension, benign    GERD (gastroesophageal reflux disease)    Glaucoma    Hypothyroidism    Mixed hyperlipidemia    Palpitations    Spinal stenosis    Spinal stenosis of lumbar region at multiple levels 08/07/2016   Type 2 diabetes mellitus (HCC)    Uterine cancer [facility]     Past Surgical History:  Procedure Laterality Date   ABDOMINAL HYSTERECTOMY     BREAST BIOPSY     benign   CATARACT EXTRACTION W/PHACO Left 03/31/2017   Procedure: CATARACT EXTRACTION PHACO AND INTRAOCULAR LENS PLACEMENT (IOC);  Surgeon: Jethro Bolus, MD;  Location: [facility];  Service: Ophthalmology;  Laterality: Left;  CDE: 8.67   CATARACT EXTRACTION W/PHACO Right 04/14/2017   Procedure: CATARACT  EXTRACTION PHACO AND INTRAOCULAR LENS PLACEMENT RIGHT EYE;  Surgeon: Jethro Bolus, MD;  Location: [facility];  Service: Ophthalmology;  Laterality: Right;  CDE: 8.52   diabetes     Type 2   LIPOMA EXCISION     Multiple dental extractions     PORT-A-CATH REMOVAL Right 09/02/2017   Procedure: MINOR REMOVAL PORT-A-CATH;  Surgeon: Franky Macho, MD;  Location: [facility];  Service: General;  Laterality: Right;   REPLACEMENT TOTAL KNEE     TONSILLECTOMY AND ADENOIDECTOMY     TOTAL KNEE ARTHROPLASTY  08/04/2012   Procedure: TOTAL KNEE ARTHROPLASTY;  Surgeon: Loanne Drilling, MD;  [facility]: [facility];  Service: Orthopedics;  Laterality: Left;    Family Psychiatric History: See below  Family History:  Family History  Problem Relation Age of Onset   Stroke Mother    Heart failure Mother    Hypertension Father    Coronary artery disease Father     Social History:  Social History   Socioeconomic History   Marital status: Single    Spouse name: Not on file   Number of children: Not on file   Years of education: Not on file   Highest education level: Not on file  Occupational History   Not on file  Tobacco Use   Smoking status: Never   Smokeless tobacco: Never  Substance and Sexual Activity   Alcohol use: No    Comment: 07/10/2016 per pt no   Drug use: No    Comment: 8/3/2017per pt no    Sexual activity: Never  Other Topics Concern   Not on file  Social History Narrative   Not on file   Social Drivers of Health   Financial Resource Strain: Not on file  Food Insecurity: No Food Insecurity (09/02/2023)   [facility]   Hunger Vital Sign    Worried About Running Out of Food in the Last Year: Never true    Ran Out of Food in the Last Year: Never true  Transportation Needs: No Transportation Needs (09/18/2023)   [facility] from [facility]   PRAPARE - Transportation    Lack of Transportation (Medical): No    Lack of Transportation (Non-Medical): No  Physical Activity: Not on file  Stress: Not on file  Social Connections: Not on file    Allergies:  Allergies  Allergen Reactions   Morphine And Codeine Other (See Comments)    Hypotension   Other     Preservative in latanoprost causes redness, pt uses preservative free latanoprost    Paxil [Paroxetine Hcl]     unknown   Latex Rash    Metabolic Disorder Labs: No results found for: "HGBA1C", "MPG" No results found for: "PROLACTIN" No results found for: "CHOL", "TRIG", "HDL", "CHOLHDL", "VLDL", "LDLCALC" No results found for: "TSH"  Therapeutic Level Labs: No results found for:  "LITHIUM" No results found for: "VALPROATE" No results found for: "CBMZ"  Current Medications: Current Outpatient Medications  Medication Sig Dispense Refill   acetaminophen (TYLENOL 8 HOUR ARTHRITIS PAIN) 650 MG CR tablet Take 650 mg by mouth every 8 (eight) hours as needed for pain.     ALPRAZolam (XANAX) 0.25 MG tablet Take 1 tablet (0.25 mg total) by mouth 4 (four) times daily. 120 tablet 3   alum & mag hydroxide-simeth (MAALOX/MYLANTA) 200-200-20 MG/5ML suspension Take 30 mLs by mouth every 6 (six) hours as needed for indigestion or heartburn.     aspirin 81 MG tablet Take 81 mg  by mouth at bedtime.      atorvastatin (LIPITOR) 10 MG tablet Take 10 mg by mouth every evening.      busPIRone (BUSPAR) 15 MG tablet Take 1 tablet (15 mg total) by mouth 2 (two) times daily. 60 tablet 3   calcium carbonate (TUMS - DOSED IN MG ELEMENTAL CALCIUM) 500 MG chewable tablet Chew 1 tablet by mouth daily as needed for indigestion or heartburn.     estradiol (ESTRACE) 0.1 MG/GM vaginal cream Insert 1/2" in vagina 2 nights/week 42.5 g 3   ibuprofen (ADVIL,MOTRIN) 200 MG tablet Take 200 mg by mouth 2 (two) times daily as needed for moderate pain.      latanoprost (XALATAN) 0.005 % ophthalmic solution Place 1 drop into both eyes at bedtime.     levothyroxine (SYNTHROID, LEVOTHROID) 88 MCG tablet Take 88 mcg by mouth daily before breakfast.      lisinopril (PRINIVIL,ZESTRIL) 10 MG tablet Take 10 mg by mouth every morning.      loratadine (CLARITIN) 10 MG tablet Take 10 mg by mouth daily as needed for allergies.     metFORMIN (GLUCOPHAGE) 500 MG tablet Take by mouth.     methenamine (HIPREX) 1 g tablet Take 1 tablet (1 g total) by mouth 2 (two) times daily with a meal. 60 tablet 11   metoprolol succinate (TOPROL-XL) 25 MG 24 hr tablet take 1/2 tablet BY MOUTH every morning 3 tablet 0   metoprolol succinate (TOPROL-XL) 25 MG 24 hr tablet take 1/2 tablet BY MOUTH every morning 3 tablet 0   polyethylene glycol  (MIRALAX / GLYCOLAX) packet Take 17 g by mouth daily as needed for mild constipation.      venlafaxine XR (EFFEXOR XR) 75 MG 24 hr capsule Take 1 capsule (75 mg total) by mouth daily. 90 capsule 2   venlafaxine XR (EFFEXOR-XR) 150 MG 24 hr capsule TAKE ONE CAPSULE BY MOUTH DAILY WITH breakast 90 capsule 3   Vibegron 75 MG TABS Take 1 tablet (75 mg total) by mouth daily.     No current facility-administered medications for this visit.     Musculoskeletal: Strength & Muscle Tone: na Gait & Station: na Patient leans: N/A  Psychiatric Specialty Exam: Review of Systems  Genitourinary:  Positive for enuresis.  All other systems reviewed and are negative.   There were no vitals taken for this visit.There is no height or weight on file to calculate BMI.  General Appearance: na  Eye Contact:  Good  Speech:  Clear and Coherent  Volume:  Normal  Mood:  Euthymic  Affect:  NA  Thought Process:  Goal Directed  Orientation:  Full (Time, Place, and Person)  Thought Content: WDL   Suicidal Thoughts:  No  Homicidal Thoughts:  No  Memory:  Immediate;   Good Recent;   Good Remote;   NA  Judgement:  Good  Insight:  Fair  Psychomotor Activity:  Normal  Concentration:  Concentration: Good and Attention Span: Good  Recall:  Good  Fund of Knowledge: Good  Language: Good  Akathisia:  No  Handed:  Right  AIMS (if indicated): not done  Assets:  Communication Skills Desire for Improvement Resilience Social Support  ADL's:  Intact  Cognition: WNL  Sleep:  Good   Screenings: GAD-7    Flowsheet Row Office Visit from 01/20/2023 in [facility] [place] Counselor from 04/20/2018 in [facility] [place] Counselor from 09/15/2016 in [facility] at  [place]  Total GAD-7 Score 5 14 10       PHQ2-9    Flowsheet Row Office Visit from 01/20/2023 in [facility] [place]  Video Visit from 07/21/2022 in [facility] at [place] Video Visit from 03/19/2022 in [facility] at [place] Video Visit from 11/26/2021 in [facility] at [place] Telemedicine from 08/05/2021 in [facility] at [facility] Total Score 2 0 0 1 0  PHQ-9 Total Score 7 -- -- -- --      Flowsheet Row ED from 08/30/2023 in [facility] Emergency [facility] at [facility] Video Visit from 07/21/2022 in [facility] at [place] Video Visit from 03/19/2022 in [facility] at [facility] RISK CATEGORY No Risk No Risk No Risk        Assessment and Plan: This patient is an 83 year old female with a history of depression and anxiety.  She continues to do well on her current regimen.  She will continue Effexor XR 225 mg daily for depression, Xanax 0.25 mg up to 4 times daily as needed for anxiety and BuSpar 15 mg twice daily for anxiety.  She will return to see me in 3 months  Collaboration of Care: Collaboration of Care: Primary Care Provider AEB notes will be shared with PCP at patient's request  Patient/Guardian was advised Release of Information must be obtained prior to any record release in order to collaborate their care with an outside provider. Patient/Guardian was advised if they have not already done so to contact the registration department to sign all necessary forms in order for [place] to release information regarding their care.   Consent: Patient/Guardian gives verbal consent for treatment and assignment of benefits for services provided during this visit. Patient/Guardian expressed understanding and agreed to proceed.    [name], MD 02/24/2024, 9:53 AM

## 2024-03-15 ENCOUNTER — Ambulatory Visit: Payer: Medicare HMO | Admitting: Urology

## 2024-03-24 ENCOUNTER — Telehealth: Payer: Self-pay | Admitting: Urology

## 2024-03-24 NOTE — Telephone Encounter (Signed)
 Patient calling the office for samples of medication:   1.  What medication and dosage are you requesting samples for? Gemtesa  2.  Are you currently out of this medication? Has 3 pills left

## 2024-03-24 NOTE — Telephone Encounter (Signed)
 Notified pt samples are up front

## 2024-03-29 ENCOUNTER — Ambulatory Visit: Admitting: Urology

## 2024-03-29 VITALS — BP 160/76 | HR 89

## 2024-03-29 DIAGNOSIS — N952 Postmenopausal atrophic vaginitis: Secondary | ICD-10-CM | POA: Diagnosis not present

## 2024-03-29 DIAGNOSIS — Z87898 Personal history of other specified conditions: Secondary | ICD-10-CM | POA: Diagnosis not present

## 2024-03-29 DIAGNOSIS — N3941 Urge incontinence: Secondary | ICD-10-CM | POA: Diagnosis not present

## 2024-03-29 DIAGNOSIS — R3129 Other microscopic hematuria: Secondary | ICD-10-CM

## 2024-03-29 DIAGNOSIS — Z87440 Personal history of urinary (tract) infections: Secondary | ICD-10-CM

## 2024-03-29 DIAGNOSIS — N390 Urinary tract infection, site not specified: Secondary | ICD-10-CM

## 2024-03-29 MED ORDER — GEMTESA 75 MG PO TABS: 1.0000 | ORAL_TABLET | Freq: Every day | ORAL | 11 refills | Status: AC

## 2024-03-29 NOTE — Progress Notes (Signed)
 history of Present Illness: Yesenia Reynolds is a 83 y.o. year old female this lady returns today for follow up of microscopic hematuria (eval negative) as well as chronic UTIs.   She was initially given methenamine  which was not well-tolerated.  Since her last visit here 3 months ago she has not been treated for urinary tract infection.  No foul-smelling urine, no dysuria.  Stable leakage.  She is on Gemtesa  daily.  She uses estrogen cream 2 nights a week.   Past Medical History:  Diagnosis Date   Agoraphobia with panic attacks 08/07/2016   Anxiety    Arthritis    Atrial fibrillation, currently in sinus rhythm    Cancer (HCC)    Endometrial   Depression    Endometrial cancer, FIGO stage IIIC (HCC) 08/08/2015   Essential hypertension, benign    GERD (gastroesophageal reflux disease)    Glaucoma    Hypothyroidism    Mixed hyperlipidemia    Palpitations    Spinal stenosis    Spinal stenosis of lumbar region at multiple levels 08/07/2016   Type 2 diabetes mellitus (HCC)    Uterine cancer [facility])     Past Surgical History:  Procedure Laterality Date   ABDOMINAL HYSTERECTOMY     BREAST BIOPSY     benign   CATARACT EXTRACTION W/PHACO Left 03/31/2017   Procedure: CATARACT EXTRACTION PHACO AND INTRAOCULAR LENS PLACEMENT (IOC);  Surgeon: Albert Huff, MD;  Location: [facility];  Service: Ophthalmology;  Laterality: Left;  CDE: 8.67   CATARACT EXTRACTION W/PHACO Right 04/14/2017   Procedure: CATARACT EXTRACTION PHACO AND INTRAOCULAR LENS PLACEMENT RIGHT EYE;  Surgeon: Albert Huff, MD;  Location: [facility];  Service: Ophthalmology;  Laterality: Right;  CDE: 8.52   diabetes     Type 2   LIPOMA EXCISION     Multiple dental extractions     PORT-A-CATH REMOVAL Right 09/02/2017   Procedure: MINOR REMOVAL PORT-A-CATH;  Surgeon: Alanda Allegra, MD;  Location: [facility];  Service: General;  Laterality: Right;   REPLACEMENT TOTAL KNEE     TONSILLECTOMY AND ADENOIDECTOMY     TOTAL KNEE ARTHROPLASTY   08/04/2012   Procedure: TOTAL KNEE ARTHROPLASTY;  Surgeon: Aurther Blue, MD;  Location: [facility];  Service: Orthopedics;  Laterality: Left;    Home Medications:  (Not in a hospital admission)   Allergies:  Allergies  Allergen Reactions   Morphine  And Codeine Other (See Comments)    Hypotension   Other     Preservative in latanoprost causes redness, pt uses preservative free latanoprost    Paxil [Paroxetine Hcl]     unknown   Latex Rash    Family History  Problem Relation Age of Onset   Stroke Mother    Heart failure Mother    Hypertension Father    Coronary artery disease Father     Social History:  reports that she has never smoked. She has never used smokeless tobacco. She reports that she does not drink alcohol  and does not use drugs.  ROS: A complete review of systems was performed.  All systems are negative except for pertinent findings as noted.  Physical Exam:  Vital signs in last 24 hours: @VSRANGES @ General:  Alert and oriented, No acute distress HEENT: Normocephalic, atraumatic Neck: No JVD or lymphadenopathy Cardiovascular: Regular rate  Lungs: Normal inspiratory/expiratory excursion Extremities: No edema Neurologic: Grossly intact   I have reviewed prior pt notes  I have independently reviewed prior imaging--CT results  Urinalysis not obtained today  Impression/Assessment:  1.  Microscopic hematuria with negative evaluation  2.  Recurrent UTIs--doing well with the estrogen cream.  Did not tolerate the 1000 mg of the methenamine   3.  Overactive bladder/wet.  On Gemtesa   Plan:  1.  A few more samples of Gemtesa  given.  She is to take it every other day  2.  I will have her come back in about 6 months for recheck  Roque Collar 03/29/2024, 7:00 AM  Malcolm Scrivener. Waunita Sandstrom MD

## 2024-04-06 DIAGNOSIS — Z299 Encounter for prophylactic measures, unspecified: Secondary | ICD-10-CM | POA: Diagnosis not present

## 2024-04-06 DIAGNOSIS — I152 Hypertension secondary to endocrine disorders: Secondary | ICD-10-CM | POA: Diagnosis not present

## 2024-04-06 DIAGNOSIS — B372 Candidiasis of skin and nail: Secondary | ICD-10-CM | POA: Diagnosis not present

## 2024-04-06 DIAGNOSIS — E1165 Type 2 diabetes mellitus with hyperglycemia: Secondary | ICD-10-CM | POA: Diagnosis not present

## 2024-04-06 DIAGNOSIS — R809 Proteinuria, unspecified: Secondary | ICD-10-CM | POA: Diagnosis not present

## 2024-04-06 DIAGNOSIS — E1159 Type 2 diabetes mellitus with other circulatory complications: Secondary | ICD-10-CM | POA: Diagnosis not present

## 2024-04-06 DIAGNOSIS — E1129 Type 2 diabetes mellitus with other diabetic kidney complication: Secondary | ICD-10-CM | POA: Diagnosis not present

## 2024-04-06 DIAGNOSIS — I10 Essential (primary) hypertension: Secondary | ICD-10-CM | POA: Diagnosis not present

## 2024-04-12 DIAGNOSIS — H401133 Primary open-angle glaucoma, bilateral, severe stage: Secondary | ICD-10-CM | POA: Diagnosis not present

## 2024-04-12 DIAGNOSIS — H1713 Central corneal opacity, bilateral: Secondary | ICD-10-CM | POA: Diagnosis not present

## 2024-04-12 DIAGNOSIS — H47323 Drusen of optic disc, bilateral: Secondary | ICD-10-CM | POA: Diagnosis not present

## 2024-04-12 DIAGNOSIS — Z961 Presence of intraocular lens: Secondary | ICD-10-CM | POA: Diagnosis not present

## 2024-05-26 ENCOUNTER — Telehealth (HOSPITAL_COMMUNITY): Admitting: Psychiatry

## 2024-05-26 ENCOUNTER — Encounter (HOSPITAL_COMMUNITY): Payer: Self-pay

## 2024-06-07 ENCOUNTER — Encounter (HOSPITAL_COMMUNITY): Payer: Self-pay

## 2024-06-07 ENCOUNTER — Telehealth (HOSPITAL_COMMUNITY): Admitting: Psychiatry

## 2024-06-16 ENCOUNTER — Encounter (HOSPITAL_COMMUNITY): Payer: Self-pay

## 2024-06-16 ENCOUNTER — Ambulatory Visit (HOSPITAL_COMMUNITY): Admitting: Psychiatry

## 2024-06-22 ENCOUNTER — Ambulatory Visit (HOSPITAL_COMMUNITY): Admitting: Psychiatry

## 2024-06-22 ENCOUNTER — Encounter (HOSPITAL_COMMUNITY): Payer: Self-pay | Admitting: Psychiatry

## 2024-06-22 VITALS — BP 173/78 | HR 95 | Ht 63.0 in | Wt 190.0 lb

## 2024-06-22 DIAGNOSIS — F329 Major depressive disorder, single episode, unspecified: Secondary | ICD-10-CM

## 2024-06-22 DIAGNOSIS — F411 Generalized anxiety disorder: Secondary | ICD-10-CM

## 2024-06-22 MED ORDER — BUSPIRONE HCL 15 MG PO TABS: 15.0000 mg | ORAL_TABLET | Freq: Three times a day (TID) | ORAL | 3 refills | Status: DC

## 2024-06-22 MED ORDER — VENLAFAXINE HCL ER 75 MG PO CP24
75.0000 mg | ORAL_CAPSULE | Freq: Every day | ORAL | 2 refills | Status: DC
Start: 2024-06-22 — End: 2024-08-16

## 2024-06-22 MED ORDER — VENLAFAXINE HCL ER 150 MG PO CP24: ORAL_CAPSULE | ORAL | 3 refills | Status: DC

## 2024-06-22 MED ORDER — ALPRAZOLAM 0.25 MG PO TABS
0.2500 mg | ORAL_TABLET | Freq: Four times a day (QID) | ORAL | 3 refills | Status: DC
Start: 2024-06-22 — End: 2024-08-16

## 2024-06-22 NOTE — Progress Notes (Signed)
 [facility] MD/PA/NP OP Progress Note  06/22/2024 11:36 AM Yesenia Reynolds  MRN:  [id number]  Chief Complaint:  Chief Complaint  Patient presents with   Depression   Anxiety   Follow-up   HPI: This patient is an 83 year old single white female who lives in a local rest home. 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   The patient returns for follow-up in person after about 4 months regarding her depression and anxiety.  She states that she was doing fairly well until about 3 weeks ago.  This was after her best friend died at the rest home.  She states that she had known this person pretty much all her life but they got to know each other better at the rest home.  The person that died was about 70 years old.  She also states that 2 other friends died around Christmas time.  She seems to be more anxious and shaky since the most recent death.  She still sleeping well and eating well.  She has regained the weight she lost while she was in the late rehab center a few months ago.  She is still dealing with chronic UTIs but other than that her health has been stable.  She still talks to numerous friends on the phone as well as a niece and nephew and occasionally her sister.  She has 1 friend who visits every Friday.  Overall however she feels more nervous in the but not particularly depressed.  She adamantly denies any thoughts of self-harm or suicide.  She is sleeping well.  Given her age I explained I cannot increase the Xanax  but perhaps we could go up a little bit on the BuSpar .  I did note that her hands are shaking and she needs to walk with a walker.  I wondered about parkinsonism but she has never been diagnosed with this. Visit Diagnosis:    ICD-10-CM   1. Major depression, chronic  F32.9     2. Generalized anxiety disorder  F41.1 ALPRAZolam  (XANAX ) 0.25 MG tablet      Past Psychiatric History: Patient was hospitalized about 8 years ago for  depression and that was responsive to ECT  Past Medical History:  Past Medical History:  Diagnosis Date   Agoraphobia with panic attacks 08/07/2016   Anxiety    Arthritis    Atrial fibrillation, currently in sinus rhythm    Cancer (HCC)    Endometrial   Depression    Endometrial cancer, FIGO stage IIIC (HCC) 08/08/2015   Essential hypertension, benign    GERD (gastroesophageal reflux disease)    Glaucoma    Hypothyroidism    Mixed hyperlipidemia    Palpitations    Spinal stenosis    Spinal stenosis of lumbar region at multiple levels 08/07/2016   Type 2 diabetes mellitus (HCC)    Uterine cancer [facility])     Past Surgical History:  Procedure Laterality Date   ABDOMINAL HYSTERECTOMY     BREAST BIOPSY     benign   CATARACT EXTRACTION W/PHACO Left 03/31/2017   Procedure: CATARACT EXTRACTION PHACO AND INTRAOCULAR LENS PLACEMENT (IOC);  Surgeon: Oneil Platts, MD;  Location: [facility];  Service: Ophthalmology;  Laterality: Left;  CDE: 8.67   CATARACT EXTRACTION W/PHACO Right 04/14/2017   Procedure: CATARACT EXTRACTION PHACO AND INTRAOCULAR LENS PLACEMENT RIGHT EYE;  Surgeon: Platts Oneil, MD;  Location: [facility];  Service: Ophthalmology;  Laterality: Right;  CDE: 8.52   diabetes     Type 2   LIPOMA EXCISION     Multiple dental extractions     PORT-A-CATH REMOVAL Right 09/02/2017   Procedure: MINOR REMOVAL PORT-A-CATH;  Surgeon: Mavis Anes, MD;  Location: [facility];  Service: General;  Laterality: Right;   REPLACEMENT TOTAL KNEE     TONSILLECTOMY AND ADENOIDECTOMY     TOTAL KNEE ARTHROPLASTY  08/04/2012   Procedure: TOTAL KNEE ARTHROPLASTY;  Surgeon: Dempsey LULLA Moan, MD;  Location: [facility];  Service: Orthopedics;  Laterality: Left;    Family Psychiatric History: None  Family History:  Family History  Problem Relation Age of Onset   Stroke Mother    Heart failure Mother    Hypertension Father    Coronary artery disease Father     Social History:  Social History    Socioeconomic History   Marital status: Single    Spouse name: Not on file   Number of children: Not on file   Years of education: Not on file   Highest education level: Not on file  Occupational History   Not on file  Tobacco Use   Smoking status: Never   Smokeless tobacco: Never  Substance and Sexual Activity   Alcohol  use: No    Comment: 07/10/2016 per pt no   Drug use: No    Comment: 8/3/2017per pt no    Sexual activity: Never  Other Topics Concern   Not on file  Social History Narrative   Not on file   Social Drivers of Health   Financial Resource Strain: Not on file  Food Insecurity: No Food Insecurity (09/02/2023)   Received from [facility]   Hunger Vital Sign    Within the past 12 months, you worried that your food would run out before you got the money to buy more.: Never true    Within the past 12 months, the food you bought just didn't last and you didn't have money to get more.: Never true  Transportation Needs: No Transportation Needs (09/18/2023)   Received from [facility]   PRAPARE - Transportation    Lack of Transportation (Medical): No    Lack of Transportation (Non-Medical): No  Physical Activity: Not on file  Stress: Not on file  Social Connections: Not on file    Allergies:  Allergies  Allergen Reactions   Morphine  And Codeine Other (See Comments)    Hypotension   Other     Preservative in latanoprost causes redness, pt uses preservative free latanoprost    Paxil [Paroxetine Hcl]     unknown   Latex Rash    Metabolic Disorder Labs: No results found for: HGBA1C, MPG No results found for: PROLACTIN No results found for: CHOL, TRIG, HDL, CHOLHDL, VLDL, LDLCALC No results found for: TSH  Therapeutic Level Labs: No results found for: LITHIUM No results found for: VALPROATE No results found for: CBMZ  Current Medications: Current Outpatient Medications  Medication Sig Dispense Refill   acetaminophen   (TYLENOL  8 HOUR ARTHRITIS PAIN) 650 MG CR tablet Take 650 mg by mouth every 8 (eight) hours as needed for pain.     alum & mag hydroxide-simeth (MAALOX/MYLANTA) 200-200-20 MG/5ML suspension Take 30 mLs by mouth every 6 (six) hours as needed for indigestion or heartburn.     aspirin 81 MG tablet Take 81 mg by mouth at bedtime.      atorvastatin  (LIPITOR) 10 MG tablet Take 10 mg by mouth every evening.  calcium  carbonate (TUMS - DOSED IN MG ELEMENTAL CALCIUM ) 500 MG chewable tablet Chew 1 tablet by mouth daily as needed for indigestion or heartburn.     estradiol  (ESTRACE ) 0.1 MG/GM vaginal cream Insert 1/2 in vagina 2 nights/week 42.5 g 3   ibuprofen (ADVIL,MOTRIN) 200 MG tablet Take 200 mg by mouth 2 (two) times daily as needed for moderate pain.      latanoprost (XALATAN) 0.005 % ophthalmic solution Place 1 drop into both eyes at bedtime.     levothyroxine  (SYNTHROID , LEVOTHROID) 88 MCG tablet Take 88 mcg by mouth daily before breakfast.      lisinopril (PRINIVIL,ZESTRIL) 10 MG tablet Take 10 mg by mouth every morning.      loratadine (CLARITIN) 10 MG tablet Take 10 mg by mouth daily as needed for allergies.     metFORMIN (GLUCOPHAGE) 500 MG tablet Take by mouth.     metoprolol  succinate (TOPROL -XL) 25 MG 24 hr tablet take 1/2 tablet BY MOUTH every morning 3 tablet 0   metoprolol  succinate (TOPROL -XL) 25 MG 24 hr tablet take 1/2 tablet BY MOUTH every morning 3 tablet 0   polyethylene glycol (MIRALAX  / GLYCOLAX ) packet Take 17 g by mouth daily as needed for mild constipation.      Vibegron  (GEMTESA ) 75 MG TABS Take 1 tablet (75 mg total) by mouth daily. 30 tablet 11   Vibegron  75 MG TABS Take 1 tablet (75 mg total) by mouth daily.     ALPRAZolam  (XANAX ) 0.25 MG tablet Take 1 tablet (0.25 mg total) by mouth 4 (four) times daily. 120 tablet 3   busPIRone  (BUSPAR ) 15 MG tablet Take 1 tablet (15 mg total) by mouth 3 (three) times daily. 90 tablet 3   venlafaxine  XR (EFFEXOR  XR) 75 MG 24 hr  capsule Take 1 capsule (75 mg total) by mouth daily. 90 capsule 2   venlafaxine  XR (EFFEXOR -XR) 150 MG 24 hr capsule TAKE ONE CAPSULE BY MOUTH DAILY WITH breakast 90 capsule 3   No current facility-administered medications for this visit.     Musculoskeletal: Strength & Muscle Tone: decreased Gait & Station: unsteady Patient leans: N/A  Psychiatric Specialty Exam: Review of Systems  Musculoskeletal:  Positive for arthralgias and back pain.  Neurological:  Positive for tremors and weakness.  Psychiatric/Behavioral:  The patient is nervous/anxious.   All other systems reviewed and are negative.   Blood pressure (!) 173/78, pulse 95, height 5' 3 (1.6 m), weight 190 lb (86.2 kg), SpO2 96%.Body mass index is 33.66 kg/m.  General Appearance: Casual, Neat, and Well Groomed  Eye Contact:  Good  Speech:  Clear and Coherent  Volume:  Normal  Mood:  Anxious  Affect:  Congruent  Thought Process:  Goal Directed  Orientation:  Full (Time, Place, and Person)  Thought Content: Rumination   Suicidal Thoughts:  No  Homicidal Thoughts:  No  Memory:  Immediate;   Good Recent;   Fair Remote;   Fair  Judgement:  Good  Insight:  Fair  Psychomotor Activity:  Decreased and Tremor  Concentration:  Concentration: Good and Attention Span: Good  Recall:  Good  Fund of Knowledge: Good  Language: Good  Akathisia:  No  Handed:  Right  AIMS (if indicated): not done  Assets:  Communication Skills Desire for Improvement Resilience Social Support  ADL's:  Intact  Cognition: WNL  Sleep:  Good   Screenings: GAD-7    Flowsheet Row Office Visit from 06/22/2024 in [facility]  Visit from 01/20/2023 in [facility] at [place] Counselor from 04/20/2018 in [facility] at [place] Counselor from 09/15/2016 in [facility] at [place]  Total GAD-7 Score 5 5 14 10     PHQ2-9    Flowsheet Row Office Visit from 06/22/2024 in [facility] at [place] Office Visit from 01/20/2023 in [facility] at [place] Video Visit from 07/21/2022 in [facility] at [place] Video Visit from 03/19/2022 in [facility] at [place] Video Visit from 11/26/2021 in [facility] at [facility] Total Score 1 2 0 0 1  PHQ-9 Total Score 1 7 -- -- --   Flowsheet Row ED from 08/30/2023 in [facility] at [facility] Video Visit from 07/21/2022 in [facility] at [place] Video Visit from 03/19/2022 in [facility] at [place]  C-SSRS RISK CATEGORY No Risk No Risk No Risk     Assessment and Plan: This patient is an 83 year old female with a history of depression and anxiety.  She continues to do okay but has become more anxious since one of her best friends passed away.  I will increase BuSpar  to 15 mg 3 times daily for anxiety.  She will continue Xanax  0.25 mg up to 4 times daily as needed for anxiety and Effexor  XR 225 mg daily for depression.  She will return to see me in 2 months  Collaboration of Care: Collaboration of Care: Primary Care Provider AEB notes will be shared with PCP at patient's request  Patient/Guardian was advised Release of Information must be obtained prior to any record release in order to collaborate their care with an outside provider. Patient/Guardian was advised if they have not already done so to contact the registration department to sign all necessary forms in order for us  to release information regarding their care.   Consent: Patient/Guardian gives verbal consent for treatment and assignment of benefits for services provided during this visit. Patient/Guardian expressed understanding and agreed to proceed.    Barnie Gull, MD 06/22/2024, 11:36 AM

## 2024-07-12 DIAGNOSIS — I10 Essential (primary) hypertension: Secondary | ICD-10-CM | POA: Diagnosis not present

## 2024-07-12 DIAGNOSIS — E119 Type 2 diabetes mellitus without complications: Secondary | ICD-10-CM | POA: Diagnosis not present

## 2024-07-12 DIAGNOSIS — R42 Dizziness and giddiness: Secondary | ICD-10-CM | POA: Diagnosis not present

## 2024-07-12 DIAGNOSIS — Z299 Encounter for prophylactic measures, unspecified: Secondary | ICD-10-CM | POA: Diagnosis not present

## 2024-07-15 NOTE — Progress Notes (Signed)
   07/15/2024  Patient ID: Yesenia Reynolds, female   DOB: 1941-01-01, 83 y.o.   MRN: [id number]  Reviewed patient regarding medication adherence from a quality report for [facility].  Per [facility] and payer portal fill history:  1. Metformin 500 mg - last filled 07/12/24 for a 15-day supply.  2. Atorvastatin  10 mg - last filled 06/28/24 for a 30-day supply.  3. Lisinopril 40 mg - last filled 06/27/24 for a 30-day supply.   I will follow up for adherence monitoring.  Thank you for allowing pharmacy to be a part of this patient's care.    Heather Factor, PharmD Clinical Pharmacist  [phone]

## 2024-07-21 DIAGNOSIS — E785 Hyperlipidemia, unspecified: Secondary | ICD-10-CM | POA: Diagnosis not present

## 2024-07-21 DIAGNOSIS — Z0000 Encounter for general adult medical examination without abnormal findings: Secondary | ICD-10-CM | POA: Diagnosis not present

## 2024-07-21 DIAGNOSIS — Z299 Encounter for prophylactic measures, unspecified: Secondary | ICD-10-CM | POA: Diagnosis not present

## 2024-07-21 DIAGNOSIS — Z713 Dietary counseling and surveillance: Secondary | ICD-10-CM | POA: Diagnosis not present

## 2024-07-21 DIAGNOSIS — Z7189 Other specified counseling: Secondary | ICD-10-CM | POA: Diagnosis not present

## 2024-07-21 DIAGNOSIS — E1129 Type 2 diabetes mellitus with other diabetic kidney complication: Secondary | ICD-10-CM | POA: Diagnosis not present

## 2024-07-21 DIAGNOSIS — R350 Frequency of micturition: Secondary | ICD-10-CM | POA: Diagnosis not present

## 2024-07-21 DIAGNOSIS — Z1389 Encounter for screening for other disorder: Secondary | ICD-10-CM | POA: Diagnosis not present

## 2024-07-21 DIAGNOSIS — I10 Essential (primary) hypertension: Secondary | ICD-10-CM | POA: Diagnosis not present

## 2024-07-21 DIAGNOSIS — R5383 Other fatigue: Secondary | ICD-10-CM | POA: Diagnosis not present

## 2024-07-21 DIAGNOSIS — Z79899 Other long term (current) drug therapy: Secondary | ICD-10-CM | POA: Diagnosis not present

## 2024-08-02 DIAGNOSIS — I10 Essential (primary) hypertension: Secondary | ICD-10-CM | POA: Diagnosis not present

## 2024-08-02 DIAGNOSIS — Z299 Encounter for prophylactic measures, unspecified: Secondary | ICD-10-CM | POA: Diagnosis not present

## 2024-08-02 DIAGNOSIS — R350 Frequency of micturition: Secondary | ICD-10-CM | POA: Diagnosis not present

## 2024-08-02 DIAGNOSIS — N390 Urinary tract infection, site not specified: Secondary | ICD-10-CM | POA: Diagnosis not present

## 2024-08-02 DIAGNOSIS — E785 Hyperlipidemia, unspecified: Secondary | ICD-10-CM | POA: Diagnosis not present

## 2024-08-16 ENCOUNTER — Encounter (HOSPITAL_COMMUNITY): Payer: Self-pay | Admitting: Psychiatry

## 2024-08-16 ENCOUNTER — Telehealth (INDEPENDENT_AMBULATORY_CARE_PROVIDER_SITE_OTHER): Admitting: Psychiatry

## 2024-08-16 DIAGNOSIS — F411 Generalized anxiety disorder: Secondary | ICD-10-CM | POA: Diagnosis not present

## 2024-08-16 DIAGNOSIS — F329 Major depressive disorder, single episode, unspecified: Secondary | ICD-10-CM | POA: Diagnosis not present

## 2024-08-16 MED ORDER — ALPRAZOLAM 0.25 MG PO TABS: 0.2500 mg | ORAL_TABLET | Freq: Four times a day (QID) | ORAL | 3 refills | Status: AC

## 2024-08-16 MED ORDER — BUSPIRONE HCL 15 MG PO TABS: 15.0000 mg | ORAL_TABLET | Freq: Three times a day (TID) | ORAL | 3 refills | Status: AC

## 2024-08-16 MED ORDER — VENLAFAXINE HCL ER 150 MG PO CP24: 300.0000 mg | ORAL_CAPSULE | ORAL | 3 refills | Status: AC

## 2024-08-16 NOTE — Progress Notes (Signed)
 Virtual Visit via Video Note  I connected with Yesenia Reynolds on 08/16/24 at  9:40 AM EDT by a video enabled telemedicine application and verified that I am speaking with the correct person using two identifiers.  Location: Patient: home Provider: office   I discussed the limitations of evaluation and management by telemedicine and the availability of in person appointments. The patient expressed understanding and agreed to proceed.     I discussed the assessment and treatment plan with the patient. The patient was provided an opportunity to ask questions and all were answered. The patient agreed with the plan and demonstrated an understanding of the instructions.   The patient was advised to call back or seek an in-person evaluation if the symptoms worsen or if the condition fails to improve as anticipated.  I provided 20 minutes of non-face-to-face time during this encounter.   Barnie Gull, MD  [facility] MD/PA/NP OP Progress Note  08/16/2024 9:58 AM Yesenia Reynolds  MRN:  [id number]  Chief Complaint:  Chief Complaint  Patient presents with   Anxiety   Depression   Follow-up   HPI: This patient is an 83 year old single white female who lives in a local rest home. 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   The returns for follow-up after about 6 weeks regarding her anxiety and depression.  Last time I saw her she was feeling more anxious and shaky.  This was after one of her friends at the rest home passed away.  We did increase her BuSpar  and she claims its helped a little bit.  She does feel worse when she first gets up in the morning somewhat depressed and anxious but feels better as the day goes on.  She is still sleeping and eating well.  She is still in contact with with her friends and tries to socialize with people at the rest home.  She denies any thoughts of self-harm or suicide.  I suggested that we increase the  Effexor  XR from 225 to 300 mg since she is still somewhat depressed and having early morning depression.  She is in agreement. Visit Diagnosis:    ICD-10-CM   1. Major depression, chronic  F32.9     2. Generalized anxiety disorder  F41.1 ALPRAZolam  (XANAX ) 0.25 MG tablet      Past Psychiatric History: Patient was hospitalized about 8 years ago for depression that was responsive to ECT  Past Medical History:  Past Medical History:  Diagnosis Date   Agoraphobia with panic attacks 08/07/2016   Anxiety    Arthritis    Atrial fibrillation, currently in sinus rhythm    Cancer (HCC)    Endometrial   Depression    Endometrial cancer, FIGO stage IIIC (HCC) 08/08/2015   Essential hypertension, benign    GERD (gastroesophageal reflux disease)    Glaucoma    Hypothyroidism    Mixed hyperlipidemia    Palpitations    Spinal stenosis    Spinal stenosis of lumbar region at multiple levels 08/07/2016   Type 2 diabetes mellitus (HCC)    Uterine cancer [facility])     Past Surgical History:  Procedure Laterality Date   ABDOMINAL HYSTERECTOMY     BREAST BIOPSY     benign   CATARACT EXTRACTION W/PHACO Left 03/31/2017   Procedure: CATARACT EXTRACTION PHACO AND INTRAOCULAR LENS PLACEMENT (IOC);  Surgeon: Oneil Platts, MD;  Location: [facility];  Service: Ophthalmology;  Laterality: Left;  CDE: 8.67   CATARACT EXTRACTION W/PHACO Right 04/14/2017   Procedure: CATARACT EXTRACTION PHACO AND INTRAOCULAR LENS PLACEMENT RIGHT EYE;  Surgeon: Roz Anes, MD;  Location: [facility];  Service: Ophthalmology;  Laterality: Right;  CDE: 8.52   diabetes     Type 2   LIPOMA EXCISION     Multiple dental extractions     PORT-A-CATH REMOVAL Right 09/02/2017   Procedure: MINOR REMOVAL PORT-A-CATH;  Surgeon: Mavis Anes, MD;  Location: [facility];  Service: General;  Laterality: Right;   REPLACEMENT TOTAL KNEE     TONSILLECTOMY AND ADENOIDECTOMY     TOTAL KNEE ARTHROPLASTY  08/04/2012   Procedure: TOTAL KNEE ARTHROPLASTY;   Surgeon: Dempsey LULLA Moan, MD;  Location: [facility];  Service: Orthopedics;  Laterality: Left;    Family Psychiatric History: See below  Family History:  Family History  Problem Relation Age of Onset   Stroke Mother    Heart failure Mother    Hypertension Father    Coronary artery disease Father     Social History:  Social History   Socioeconomic History   Marital status: Single    Spouse name: Not on file   Number of children: Not on file   Years of education: Not on file   Highest education level: Not on file  Occupational History   Not on file  Tobacco Use   Smoking status: Never   Smokeless tobacco: Never  Substance and Sexual Activity   Alcohol  use: No    Comment: 07/10/2016 per pt no   Drug use: No    Comment: 8/3/2017per pt no    Sexual activity: Never  Other Topics Concern   Not on file  Social History Narrative   Not on file   Social Drivers of Health   Financial Resource Strain: Not on file  Food Insecurity: No Food Insecurity (09/02/2023)   Received from [facility]   Hunger Vital Sign    Within the past 12 months, you worried that your food would run out before you got the money to buy more.: Never true    Within the past 12 months, the food you bought just didn't last and you didn't have money to get more.: Never true  Transportation Needs: No Transportation Needs (09/18/2023)   Received from [facility]   PRAPARE - Transportation    Lack of Transportation (Medical): No    Lack of Transportation (Non-Medical): No  Physical Activity: Not on file  Stress: Not on file  Social Connections: Not on file    Allergies:  Allergies  Allergen Reactions   Morphine  And Codeine Other (See Comments)    Hypotension   Other     Preservative in latanoprost causes redness, pt uses preservative free latanoprost    Paxil [Paroxetine Hcl]     unknown   Latex Rash    Metabolic Disorder Labs: No results found for: HGBA1C, MPG No results found for:  PROLACTIN No results found for: CHOL, TRIG, HDL, CHOLHDL, VLDL, LDLCALC No results found for: TSH  Therapeutic Level Labs: No results found for: LITHIUM No results found for: VALPROATE No results found for: CBMZ  Current Medications: Current Outpatient Medications  Medication Sig Dispense Refill   acetaminophen  (TYLENOL  8 HOUR ARTHRITIS PAIN) 650 MG CR tablet Take 650 mg by mouth every 8 (eight) hours as needed for pain.     ALPRAZolam  (XANAX ) 0.25 MG tablet Take 1 tablet (0.25 mg total) by mouth 4 (four) times daily.  120 tablet 3   alum & mag hydroxide-simeth (MAALOX/MYLANTA) 200-200-20 MG/5ML suspension Take 30 mLs by mouth every 6 (six) hours as needed for indigestion or heartburn.     aspirin 81 MG tablet Take 81 mg by mouth at bedtime.      atorvastatin  (LIPITOR) 10 MG tablet Take 10 mg by mouth every evening.      busPIRone  (BUSPAR ) 15 MG tablet Take 1 tablet (15 mg total) by mouth 3 (three) times daily. 90 tablet 3   calcium  carbonate (TUMS - DOSED IN MG ELEMENTAL CALCIUM ) 500 MG chewable tablet Chew 1 tablet by mouth daily as needed for indigestion or heartburn.     estradiol  (ESTRACE ) 0.1 MG/GM vaginal cream Insert 1/2 in vagina 2 nights/week 42.5 g 3   ibuprofen (ADVIL,MOTRIN) 200 MG tablet Take 200 mg by mouth 2 (two) times daily as needed for moderate pain.      latanoprost (XALATAN) 0.005 % ophthalmic solution Place 1 drop into both eyes at bedtime.     levothyroxine  (SYNTHROID , LEVOTHROID) 88 MCG tablet Take 88 mcg by mouth daily before breakfast.      lisinopril (PRINIVIL,ZESTRIL) 10 MG tablet Take 10 mg by mouth every morning.      loratadine (CLARITIN) 10 MG tablet Take 10 mg by mouth daily as needed for allergies.     metFORMIN (GLUCOPHAGE) 500 MG tablet Take by mouth.     metoprolol  succinate (TOPROL -XL) 25 MG 24 hr tablet take 1/2 tablet BY MOUTH every morning 3 tablet 0   metoprolol  succinate (TOPROL -XL) 25 MG 24 hr tablet take 1/2 tablet BY  MOUTH every morning 3 tablet 0   polyethylene glycol (MIRALAX  / GLYCOLAX ) packet Take 17 g by mouth daily as needed for mild constipation.      venlafaxine  XR (EFFEXOR -XR) 150 MG 24 hr capsule Take 2 capsules (300 mg total) by mouth every morning. Dosage increase 180 capsule 3   Vibegron  (GEMTESA ) 75 MG TABS Take 1 tablet (75 mg total) by mouth daily. 30 tablet 11   Vibegron  75 MG TABS Take 1 tablet (75 mg total) by mouth daily.     No current facility-administered medications for this visit.     Musculoskeletal: Strength & Muscle Tone: decreased Gait & Station: unsteady Patient leans: N/A  Psychiatric Specialty Exam: Review of Systems  Musculoskeletal:  Positive for arthralgias and gait problem.  Psychiatric/Behavioral:  Positive for dysphoric mood. The patient is nervous/anxious.   All other systems reviewed and are negative.   There were no vitals taken for this visit.There is no height or weight on file to calculate BMI.  General Appearance: Casual, Neat, and Well Groomed  Eye Contact:  Good  Speech:  Clear and Coherent  Volume:  Normal  Mood:  Anxious and Dysphoric  Affect:  Congruent  Thought Process:  Goal Directed  Orientation:  Full (Time, Place, and Person)  Thought Content: Rumination   Suicidal Thoughts:  No  Homicidal Thoughts:  No  Memory:  Immediate;   Good Recent;   Fair Remote;   NA  Judgement:  Good  Insight:  Fair  Psychomotor Activity:  Decreased  Concentration:  Concentration: Good and Attention Span: Good  Recall:  Fair  Fund of Knowledge: Good  Language: Good  Akathisia:  No  Handed:  Right  AIMS (if indicated): not done  Assets:  Communication Skills Desire for Improvement Resilience Social Support  ADL's:  Intact  Cognition: WNL  Sleep:  Good   Screenings: GAD-7  Flowsheet Row Office Visit from 06/22/2024 in [facility] at [place] Office Visit from 01/20/2023 in [facility]  at [place] [facility] from 04/20/2018 in [facility] at [place] [facility] from 09/15/2016 in [facility] at [place]  Total GAD-7 Score 5 5 14 10    PHQ2-9    Flowsheet Row Office Visit from 06/22/2024 in [facility] at [place] Office Visit from 01/20/2023 in [facility] at [place] Video Visit from 07/21/2022 in [facility] at [place] Video Visit from 03/19/2022 in [facility] at [place] Video Visit from 11/26/2021 in [facility] at [facility] Total Score 1 2 0 0 1  PHQ-9 Total Score 1 7 -- -- --   Flowsheet Row ED from 08/30/2023 in [facility] Department at [facility] Video Visit from 07/21/2022 in [facility] at [place] Video Visit from 03/19/2022 in [facility] at [place]  C-SSRS RISK CATEGORY No Risk No Risk No Risk     Assessment and Plan: This patient is an 83 year old female with a history of anxiety and depression.  Since she is still somewhat depressed particular in the morning we will increase Effexor  XR from 225 mg to 300 mg every morning for depression.  She will continue BuSpar  15 mg 3 times daily for anxiety and Xanax  0.25 mg 4 times daily for anxiety.  She will return to see me in 6 weeks  Collaboration of Care: Collaboration of Care: Primary Care Provider AEB notes will be shared with PCP at patient's request  Patient/Guardian was advised Release of Information must be obtained prior to any record release in order to collaborate their care with an outside provider. Patient/Guardian was advised if they have not already done so to contact the registration department to sign all necessary forms in order for us  to release information regarding their care.   Consent: Patient/Guardian  gives verbal consent for treatment and assignment of benefits for services provided during this visit. Patient/Guardian expressed understanding and agreed to proceed.    Barnie Gull, MD 08/16/2024, 9:58 AM

## 2024-08-18 DIAGNOSIS — Z299 Encounter for prophylactic measures, unspecified: Secondary | ICD-10-CM | POA: Diagnosis not present

## 2024-08-18 DIAGNOSIS — I10 Essential (primary) hypertension: Secondary | ICD-10-CM | POA: Diagnosis not present

## 2024-08-18 DIAGNOSIS — N390 Urinary tract infection, site not specified: Secondary | ICD-10-CM | POA: Diagnosis not present

## 2024-08-18 DIAGNOSIS — E1165 Type 2 diabetes mellitus with hyperglycemia: Secondary | ICD-10-CM | POA: Diagnosis not present

## 2024-08-22 ENCOUNTER — Telehealth: Payer: Self-pay

## 2024-08-22 NOTE — Telephone Encounter (Signed)
 Called pt to get UTI symptoms no answers lvm to c.b for details

## 2024-08-22 NOTE — Telephone Encounter (Signed)
 Dysuria  Patient called with c/o dysuria x  days.  Pain:   Severity:  Associated Signs and Symptoms:  Fever: Temp. Chills:  Hematuria:  Urgency:  Frequency:  Hesitancy: Incontinence:  Nausea:  Vomiting:    Patient's daughter left a voice message regarding UTI primary doctor has treated with antibiotic.  UTI symptoms will not go away. PCP advised patient to call urologist.  Please advise.  Call:  [phone]

## 2024-08-23 ENCOUNTER — Telehealth: Payer: Self-pay

## 2024-08-23 DIAGNOSIS — N390 Urinary tract infection, site not specified: Secondary | ICD-10-CM

## 2024-08-23 NOTE — Telephone Encounter (Signed)
 Pt states she is on ceflexian 500mg  x3 daily unsure for how long also states she is is on gemtesa  as well pt states she does not believe she has a UTI but just wants to know why she keeps getting them pt states she had a UTI about 5 days ago and wants to know why she always has them     Associated Signs and Symptoms:  Fever: no Chills: no Hematuria: no Urgency: yes Frequency: leaking  Hesitancy:no Incontinence: yes Nausea: no Vomiting: no  Urologic History:  Any Recent Urologic Surgeries or Procedures:no Recurrent UTI's:yes Cystitis: no  Prostatitis:no Kidney or Bladder Stones: no Plan: Walk-in Clinic: no Appointment w/Physician: [no Lab visit scheduled for urine drop off: No Advice given: Pt made aware gemtesa  is a OAB medication, and that we will add her to the waitlist if she does not have a UTI pt states hse doesn't just wants to know why she always does   Do you take on daily medications for UTI suppression Yes  estradiol 

## 2024-08-23 NOTE — Addendum Note (Signed)
 Addended by: SAMMIE EXIE HERO on: 08/23/2024 01:43 PM   Modules accepted: Orders

## 2024-08-23 NOTE — Telephone Encounter (Signed)
 Called pt to give MD Dahlstedt recommendations pt voiced her understanding and scheduled a ua drop off

## 2024-08-25 ENCOUNTER — Other Ambulatory Visit

## 2024-08-25 DIAGNOSIS — N390 Urinary tract infection, site not specified: Secondary | ICD-10-CM

## 2024-08-25 LAB — URINALYSIS, ROUTINE W REFLEX MICROSCOPIC
Bilirubin, UA: NEGATIVE
Glucose, UA: NEGATIVE
Ketones, UA: NEGATIVE
Leukocytes,UA: NEGATIVE
Nitrite, UA: NEGATIVE
Protein,UA: NEGATIVE
RBC, UA: NEGATIVE
Specific Gravity, UA: 1.01 (ref 1.005–1.030)
Urobilinogen, Ur: 0.2 mg/dL (ref 0.2–1.0)
pH, UA: 6.5 (ref 5.0–7.5)

## 2024-08-26 ENCOUNTER — Telehealth: Payer: Self-pay

## 2024-08-26 ENCOUNTER — Ambulatory Visit: Payer: Self-pay

## 2024-08-26 NOTE — Telephone Encounter (Signed)
 Patient presents today with complaints of  Urgency, frequency, leaking, and incontinence.  UA done today, Dr. Sherrilee reviewed results and No antibiotic needed  .  Patient aware of MD recommendations.     Tried calling pt with no answer, left vm for return call to office.  [id number], CMA

## 2024-08-29 ENCOUNTER — Other Ambulatory Visit (HOSPITAL_COMMUNITY): Payer: Self-pay | Admitting: Psychiatry

## 2024-08-29 DIAGNOSIS — F411 Generalized anxiety disorder: Secondary | ICD-10-CM

## 2024-09-12 DIAGNOSIS — N390 Urinary tract infection, site not specified: Secondary | ICD-10-CM | POA: Diagnosis not present

## 2024-09-12 DIAGNOSIS — I10 Essential (primary) hypertension: Secondary | ICD-10-CM | POA: Diagnosis not present

## 2024-09-12 DIAGNOSIS — Z299 Encounter for prophylactic measures, unspecified: Secondary | ICD-10-CM | POA: Diagnosis not present

## 2024-09-13 DIAGNOSIS — R54 Age-related physical debility: Secondary | ICD-10-CM | POA: Diagnosis not present

## 2024-09-13 DIAGNOSIS — N289 Disorder of kidney and ureter, unspecified: Secondary | ICD-10-CM | POA: Diagnosis not present

## 2024-09-13 DIAGNOSIS — E785 Hyperlipidemia, unspecified: Secondary | ICD-10-CM | POA: Diagnosis not present

## 2024-09-13 DIAGNOSIS — E871 Hypo-osmolality and hyponatremia: Secondary | ICD-10-CM | POA: Diagnosis not present

## 2024-09-13 DIAGNOSIS — R0989 Other specified symptoms and signs involving the circulatory and respiratory systems: Secondary | ICD-10-CM | POA: Diagnosis not present

## 2024-09-13 DIAGNOSIS — N3001 Acute cystitis with hematuria: Secondary | ICD-10-CM | POA: Diagnosis not present

## 2024-09-13 DIAGNOSIS — G9341 Metabolic encephalopathy: Secondary | ICD-10-CM | POA: Diagnosis not present

## 2024-09-13 DIAGNOSIS — N390 Urinary tract infection, site not specified: Secondary | ICD-10-CM | POA: Diagnosis not present

## 2024-09-13 DIAGNOSIS — Z7901 Long term (current) use of anticoagulants: Secondary | ICD-10-CM | POA: Diagnosis not present

## 2024-09-13 DIAGNOSIS — Z7984 Long term (current) use of oral hypoglycemic drugs: Secondary | ICD-10-CM | POA: Diagnosis not present

## 2024-09-13 DIAGNOSIS — Z8249 Family history of ischemic heart disease and other diseases of the circulatory system: Secondary | ICD-10-CM | POA: Diagnosis not present

## 2024-09-13 DIAGNOSIS — Z792 Long term (current) use of antibiotics: Secondary | ICD-10-CM | POA: Diagnosis not present

## 2024-09-13 DIAGNOSIS — M1990 Unspecified osteoarthritis, unspecified site: Secondary | ICD-10-CM | POA: Diagnosis not present

## 2024-09-13 DIAGNOSIS — Z885 Allergy status to narcotic agent status: Secondary | ICD-10-CM | POA: Diagnosis not present

## 2024-09-13 DIAGNOSIS — E1140 Type 2 diabetes mellitus with diabetic neuropathy, unspecified: Secondary | ICD-10-CM | POA: Diagnosis not present

## 2024-09-13 DIAGNOSIS — Z79899 Other long term (current) drug therapy: Secondary | ICD-10-CM | POA: Diagnosis not present

## 2024-09-13 DIAGNOSIS — R319 Hematuria, unspecified: Secondary | ICD-10-CM | POA: Diagnosis not present

## 2024-09-13 DIAGNOSIS — Z66 Do not resuscitate: Secondary | ICD-10-CM | POA: Diagnosis not present

## 2024-09-13 DIAGNOSIS — I10 Essential (primary) hypertension: Secondary | ICD-10-CM | POA: Diagnosis not present

## 2024-09-13 DIAGNOSIS — Z7982 Long term (current) use of aspirin: Secondary | ICD-10-CM | POA: Diagnosis not present

## 2024-09-13 DIAGNOSIS — G9340 Encephalopathy, unspecified: Secondary | ICD-10-CM | POA: Diagnosis not present

## 2024-09-13 DIAGNOSIS — E878 Other disorders of electrolyte and fluid balance, not elsewhere classified: Secondary | ICD-10-CM | POA: Diagnosis not present

## 2024-09-13 NOTE — H&P (Signed)
 ------------------------------------------------------------------------------- Attestation signed by Feliz Margart Fallow, DO at 09/16/24 1857 I was the supervising physician during time of service.  Case was discussed with APP and I agree with management as outlined below. -------------------------------------------------------------------------------     History and Physical [facility]   09/13/24    Patient name: Yesenia Reynolds DOB January 18, 1941 MRN#: [id number] PCP: Rosamond Leta NOVAK, MD Time: 3:43 PM Primary Care Provider:  Rosamond Leta NOVAK, MD Inpatient primary attending provider: Margart Fallow Feliz, DO  _________________________________________________________________________  Admission HPI   Patient admitted on: 09/13/2024 11:44 AM  Patient admitted by: Margart Fallow Feliz, DO   CHIEF COMPLAINT: Altered mental status  Day of admission HPI:  Yesenia Reynolds  is a 83 y.o. female with a PMH significant for sepsis with acute renal failure; acute cystitis with hematuria; generalized weakness; hypothyroidism; stress urinary incontinence; diabetes mellitus inadequately controlled; hypertension who presented with altered mental status/confusion/acute cystitis with hematuria  History obtained from patient does not recall events at nursing home, [facility].  Patient was sent from [facility] and according to the ER notes, presented by EMS with altered mental status.  Apparently, patient found by staff sitting on the toilet and unable to get up due to confusion and what to do next.  Patient had a similar episode approximately a year past and was admitted for sepsis secondary to UTI at that time.  Additionally, patient is currently having a UTI and is supposed to have been on the antibiotic but it is unclear whether or not patient has been taking the antibiotic appropriately.  There is no other family members or staff members to assist with history.  In the ER,  patient was alert to person but not to place or time per ER provider.  She sees [facility] and was last seen in April and had a 57-month follow-up which would be October.  She is supposed to be on UTI prophylaxis but we do not see any evidence of any antibiotics that she has picked up from the drugstore and taking currently.  The 2 antibiotics noted on her medication dispense profile are levofloxacin 7 or 50 mg 3 tablets to be taken over 6-day course scribed by this hospital team on September 10, 2023 and amoxicillin  875 5-day course of 10 pills prescribed on October 26, 2023.  Patient does say that she has pyuria/dysuria but no urgency frequency flank pain hematuria or hesitancy.  No other complaint.  Normal appetite.  No fever.  No abdominal pain.  Patient admitted on Home O2? - no Patient on home anticoagulant? -  no Patient admitted with Chronic home foley catheter? - no Foley catheter placed or replaced by another service prior to admission? - no Central Line Status: NONE  Mental Status on Admission: Earlier in admission, patient was not awake alert and oriented The patient is Alert and oriented to PERSON The patient is Alert And oriented to TIME The patient is Alert and oriented to LOCATION  Problem List, Assessment & Plan    ASSESSMENT & PLAN (In order of descending acuity)  Principal Problem:   Altered mental status Active Problems:   Acute cystitis with hematuria   Electrolyte abnormality   Confusion   Hyponatremia     Altered mental status//confusion Admit to hospitalist service as inpatient to the MedSurg unit with probability of greater than 48-hour stay Heart healthy diabetic diet IV antibiotics cefepime 1 g every 12 hours x 7 days Trend troponin Resume appropriate home meds CBC with differential and  CMP with GFR every morning Lovenox 40 mg daily PT OT consult Saline at 50 cc/h Sepsis protocol Lactate = 1.7    Acute cystitis with hematuria Urinalysis shows  cloudy urine with moderate leukocyte esterase positive nitrites large blood and large bilirubin and packed bacteria and moderate mucus and large protein and 15 mg/dL ketones      Hyponatremia//hypochloremia//electrolyte abnormality Sodium = 130; we will replete and monitor Potassium normal at 3.8 Chloride low at 92; we will replete and monitor  CODE STATUS: Full code IV fluids normal saline Diet: Heart healthy diet DVT prophylaxis: Lovenox 40 mg every 24 hours    Incidental Findings for ourpatient Follow-Up: No significant incidental findings present   ADDITIONAL NON-ACUTE FINDINGS, OBSERVATIONS, FAMILY DISCUSSIONS, ETC. (When present):  Physical exam General: No acute distress; initially confused with the ER doctor but fully oriented and awake with me HEENT [place]/AT; moist mucous membrane Heart: Heart rate wnl: Rhythm is reg    Lungs: Clear to auscultation bilaterally Abdomen: Soft nontender nondistended Extremities: No peripheral edema Skin: Dry no rash Neuropsych: Within normal limits; patient awake alert and oriented x 3.  Has improved since initially evaluated by ER provider.  DVT Prophylaxis Ordered: SQ Enoxaparin __________________________________________________________________________  Temp:  [36.4 C (97.6 F)] 36.4 C (97.6 F) Pulse:  [79-88] 79 Resp:  [14-18] 14 BP: (133-159)/(65-95) 159/65 SpO2:  [94 %] 94 % Body mass index is 29.72 kg/m. Intake/Output last 3 shifts: No intake/output data recorded.  Consults Requested  IP CONSULT TO HOSPITALIST     In hospital Nutrition: Nutrition Therapy Consistent Carb; Consistent Carb 60/60/60 (4/4/4)    An advanced care planning discussion was  had with patient and/or patient's decisions maker (documented separately).  CODE STATUS :                    Full Code   Given this patient's known comorbid illnesses and condition Present on Admission, plan of care includes acute interventions of altered mental  status/confusion/acute cystitis with hematuria/hyponatremia/hypochloremia/elevated creatinine of 1.36 as noted in the Problem List, Assessment & Plan noted above.  I fully expect, with this information in hand, that this patient will require at least two medically necessary midnights of hospital care prior to a safe discharge.   The patient's hospital stay is complicated by the above clinically significant conditions, as documented in Assessment and Plan, which are present on admission and requiring additional evaluation and treatment or having a significant effect of this patient's care.   __________________________________________________________  Allergies  Allergen Reactions  . Opioids - Morphine  Analogues Other (See Comments)    Other reaction(s): Hypotension Hypotension Hypotension  . Paroxetine Hcl     unknown  . Latex Rash     Past Medical History[1]  Past Surgical History[2]   Family History[3]       Current Medications[4]  REFER TO EPIC FOR FULL LIST OF CURRENT MEDICATIONS ORDERED ON ADMISSION. THESE ORDERS APPEAR ONLY WHEN RELEASED, WHICH MAY HAPPEN AFTER ADMISSION ONCE PATIENT IS TRANSFERRED FROM THE ED.  Allergies  Allergies[5]  Imaging  ECG 12 Lead Result Date: 09/13/2024 Sinus rhythm with premature atrial complexes Minimal voltage criteria for LVH, may be normal variant ( R in aVL ) Septal infarct , age undetermined Abnormal ECG When compared with ECG of 01-Sep-2023 23:31, Nonspecific T wave abnormality has replaced inverted T waves in Inferior leads T wave inversion no longer evident in Lateral leads  CT Head Wo Contrast Result Date: 09/13/2024 Exam:  CT Head without  Contrast  History:  AMS  Technique: Routine brain CT without IV contrast. AEC (automated exposure control) and/or manual techniques such as size-specific kV and mAs are employed where appropriate to reduce radiation exposure for all CT exams.  Comparison:  None.  Findings:   BRAIN:  No CT evidence of  acute infarction, hemorrhage, edema, mass or mass effect. Scattered white matter hypodensities combined with chronic brain substance volume loss indicate generalized chronic senescent change of the brain. Ventricles and basilar cisterns are symmetric dimensions without midline shift.  SOFT TISSUES:  Negative. CALVARIUM:  Negative. No fracture. SINUSES AND MASTOIDS:  No mucosal thickening or fluid.    Chronic findings as above with no acute intracranial abnormality visualized  Signed (Electronic Signature): 09/13/2024 1:12 PM Signed By: Dow JAYSON Agee, MD  XR Chest Portable Result Date: 09/13/2024 Exam:  Portable Chest  History:  83 year old with altered mental status.  Technique:  Single frontal view.  Comparison:  09/05/2023  Findings:   The heart size, mediastinal contours, pleural surfaces and pulmonary vascularity are all stable with no active CHF. The lungs are clear of acute infiltrates and consolidation. There is a low inspiratory lung volume again seen. There is no detectable pleural effusion or pneumothorax. The regional bones are stable with degenerative changes noted in the thoracic spine. Degenerative change also noted in the left shoulder similar to the prior exam.    1.    No acute cardiopulmonary disease. 2.    No interval change.  Signed (Electronic Signature): 09/13/2024 12:33 PM Signed By: Ozell JONETTA Quale, MD   Lab Results   Recent Labs    09/13/24 1226  WBC 11.6*  HGB 12.2  HCT 37.7  PLT 284   Recent Labs    09/13/24 1226  NA 130*  K 3.8  CL 92*  CO2 29.8  BUN 25*  CREATININE 1.36*  GLU 149  CALCIUM  8.6  ALBUMIN 3.1*  PROT 6.9  BILITOT 0.6  AST 13*  ALT 15  ALKPHOS 132*  TSH 3.284  LACTATE 1.7   Recent Labs    09/13/24 1431  TROPONINI 22   Recent Labs    09/13/24 1233  WBCUA 50*  NITRITE Positive*  LEUKOCYTESUR Moderate*  BACTERIA Packed*  RBCUA 50*  BLOODU Large*  GLUCOSEU Negative  PROTEINUA >/= 300 mg/dL*  KETONESU 15 mg/dL*   No results  for input(s): OPIAU, BENZU, TRICYCLIC, PCPU, AMPHU, COCAU, CANNAU, BARBU, ETOH, ACETAMIN, SALICYLATE in the last 72 hours. No results for input(s): PREGTESTUR, PREGPOC in the last 72 hours. No results for input(s): OCCULTBLD, RAPSCRN, CDIFRPCR, CDIFFNAP1, A1C, CHOL, LDL, HDL, TRIG in the last 72 hours. No results for input(s): O2SOUR, FIO2ART, PHART, PCO2ART, PO2ART, HCO3ART, O2SATART, BEART in the last 72 hours. Pending Labs     Order Current Status   Blood Culture In process   Blood Culture In process   Osmolality, Random Urine In process   Urine Culture In process       Home Medications   Prior to Admission medications  Medication Dose, Route, Frequency  aspirin 81 MG chewable tablet 81 mg, Oral, Daily (standard)  atorvastatin  (LIPITOR) 10 MG tablet 10 mg, Oral, Daily (standard)  brimonidine (ALPHAGAN) 0.2 % ophthalmic solution 1 drop, Both Eyes, 2 times a day  cetirizine (ZYRTEC) 10 MG tablet 10 mg, Oral, Nightly  cholecalciferol, vitamin D3 25 mcg, 1,000 units,, (CHOLECALCIFEROL-25 MCG, 1,000 UNIT,) 1,000 unit (25 mcg) tablet 25 mcg, Oral, Daily (standard)  escitalopram  oxalate (LEXAPRO ) 10 MG tablet 10  mg, Oral, Daily (standard)  estradiol  (ESTRACE ) 0.01 % (0.1 mg/gram) vaginal cream 0.5 g, Vaginal, Every Monday, Thursday  glimepiride (AMARYL) 2 MG tablet 2 mg, Oral, Daily  latanoprost (XALATAN) 0.005 % ophthalmic solution 1 drop, Both Eyes, Nightly  levothyroxine  88 mcg cap 88 mcg, Oral, Daily (standard)  lisinopril (PRINIVIL,ZESTRIL) 20 MG tablet 20 mg, Oral, Daily (standard)  magnesium oxide (MAG-OX) 400 mg (241.3 mg elemental magnesium) tablet 1 tablet, Oral, 2 times a day (standard)  metFORMIN (GLUCOPHAGE) 500 MG tablet 500 mg, Oral, 2 times a day with meals  metoPROLOL  succinate (TOPROL -XL) 25 MG 24 hr tablet 12.5 mg, Oral, Daily (standard)  oxybutynin (DITROPAN) 5 MG tablet 5 mg, Oral, 3 times a day (standard)   venlafaxine  (EFFEXOR -XR) 150 MG 24 hr capsule 150 mg, Oral, Daily   Joana JONETTA Hurst, PA Hospitalist, [facility] 09/13/24, 3:43 PM       [1] Past Medical History: Diagnosis Date  . Anemia   . Arthritis   . Cancer    (CMS-HCC)    Endometrial Cancer  . Depression   . Diabetes mellitus    (CMS-HCC)   . Endometrial cancer    (CMS-HCC)   . Hyperlipidemia   . Hypertension   . Neuropathy   [2] Past Surgical History: Procedure Laterality Date  . BREAST BIOPSY Bilateral    2000-left, 1960's right- both benign  . CATARACT EXTRACTION    . HYSTERECTOMY    . JOINT REPLACEMENT Left    Knee  . KNEE SURGERY    . LIPOMA RESECTION    . TONSILLECTOMY    [3] Family History Problem Relation Age of Onset  . Cancer Mother   . Hypertension Mother   . Cancer Father   . Hypertension Father   [4]  Current Facility-Administered Medications:  .  cefepime (MAXIPIME) 1 g in sodium chloride  0.9 % (NS) 100 mL IVPB-connector bag, 1 g, Intravenous, Q12H, Gambill, Christy L, PA, Stopped at 09/13/24 1535  Current Outpatient Medications:  .  aspirin 81 MG chewable tablet, Chew 1 tablet (81 mg total) daily., Disp: 30 tablet, Rfl: 0 .  atorvastatin  (LIPITOR) 10 MG tablet, Take 1 tablet (10 mg total) by mouth daily., Disp: 30 tablet, Rfl: 0 .  brimonidine (ALPHAGAN) 0.2 % ophthalmic solution, Administer 1 drop to both eyes two (2) times a day., Disp: 3 mL, Rfl: 11 .  cetirizine (ZYRTEC) 10 MG tablet, Take 1 tablet (10 mg total) by mouth nightly., Disp: 90 tablet, Rfl: 3 .  cholecalciferol, vitamin D3 25 mcg, 1,000 units,, (CHOLECALCIFEROL-25 MCG, 1,000 UNIT,) 1,000 unit (25 mcg) tablet, Take 1 tablet (25 mcg total) by mouth daily., Disp: 90 tablet, Rfl: 3 .  escitalopram  oxalate (LEXAPRO ) 10 MG tablet, Take 1 tablet (10 mg total) by mouth daily., Disp: 30 tablet, Rfl: 0 .  estradiol  (ESTRACE ) 0.01 % (0.1 mg/gram) vaginal cream, Insert 0.5 g into the vagina every Monday and Thursday., Disp: 4 g, Rfl: 11 .   glimepiride (AMARYL) 2 MG tablet, Take 1 tablet (2 mg total) by mouth in the morning., Disp: 30 tablet, Rfl: 0 .  latanoprost (XALATAN) 0.005 % ophthalmic solution, Administer 1 drop to both eyes nightly., Disp: 2.5 mL, Rfl: 7 .  levothyroxine  88 mcg cap, Take 1 capsule (88 mcg total) by mouth daily., Disp: 30 capsule, Rfl: 0 .  lisinopril (PRINIVIL,ZESTRIL) 20 MG tablet, Take 1 tablet (20 mg total) by mouth daily., Disp: 30 tablet, Rfl: 0 .  magnesium oxide (MAG-OX) 400 mg (241.3 mg elemental  magnesium) tablet, Take 1 tablet by mouth two (2) times a day., Disp: , Rfl:  .  metFORMIN (GLUCOPHAGE) 500 MG tablet, Take 1 tablet (500 mg total) by mouth in the morning and 1 tablet (500 mg total) in the evening. Take with meals., Disp: , Rfl:  .  metoPROLOL  succinate (TOPROL -XL) 25 MG 24 hr tablet, Take 0.5 tablets (12.5 mg total) by mouth daily., Disp: 15 tablet, Rfl: 0 .  oxybutynin (DITROPAN) 5 MG tablet, Take 1 tablet (5 mg total) by mouth Three (3) times a day., Disp: 270 tablet, Rfl: 3 .  venlafaxine  (EFFEXOR -XR) 150 MG 24 hr capsule, Take 1 capsule (150 mg total) by mouth in the morning., Disp: 30 capsule, Rfl: 0 [5] Allergies Allergen Reactions  . Opioids - Morphine  Analogues Other (See Comments)    Other reaction(s): Hypotension Hypotension Hypotension  . Paroxetine Hcl     unknown  . Latex Rash

## 2024-09-16 NOTE — Consults (Signed)
 [facility] Acute Telepsychiatry Initial Inpatient Consult     SERVICE DATE: September 16, 2024   Assessment:    Yesenia Reynolds is a 83 y.o. female with pertinent past medical and psychiatric diagnoses of sepsis with acute renal failure, acute cystitis with hematuria, generalized weakness, hypothyroidism, stress urinary incontinence, chronic UTI, HLD, arthritis, poorly controlled DM, HTN, GAD and MDD admitted 09/13/2024 11:44 AM for altered mental status.  Patient was seen in consultation by Psychiatry at the request of Brutus Franco Shank, FNP with General Medicine (MED) for evaluation of Aggression/Agitation and Confusion.    Today, the patient appears acutely delirious making the interview difficult so some information and recommendations are based on chart review.    The patient's current presentation of agitation and confusion is most consistent with Acute Delirium vs. Major NCD with behavioral disturbance.  Medical and psychiatric factors contributing to current presentation include possibly polypharmacy, chronic use of benzodiazepines, acute UTI/cystitis/renal failure.    Given the patient is having acute renal impairment and some recent eGFR below 45, would consider tapering Effexor  dose. I'd also consider trying to taper Xanax  dose from QID to TID. Due to her age and other medical conditions, she is prone to oversedation, falls, and dementia especially if she has been on benzodiazapine chronically. I'd consider holding Buspar  for now to prevent polypharmacy and any unnecessary drug-drug interactions. Lastly, for acute agitation or delirium during the day, can schedule Seroqeul 25mg  PO BID or as PRN's. Lastly, would review current meds and try to hold/taper/discontinue any associated with delirium like anticholinergics like oxybutynin.    Of note, per chart review patient has been on multiple SSRIs in the past and was at [facility] for a month for ECT for her depression. Usually when ECT  is recommended patient has failed many other psychopharmological approaches to depression and anxiety, thus questioning how effective her psychiatric medications are for her.    Please see below for detailed recommendations.   Risk Assessment: A suicide and violence risk assessment was performed as part of this evaluation. Specific questioning about thoughts, plans, suicidal intent, and self-harm: Denies thoughts of self-harm. Denies suicidal ideation, plans, or intent. . Risk factors for self-harm/suicide: unable to assess. Protective factors against self-harm/suicide:  unable to assess. Risk factors for harm to others: unable to assess. Protective factors against harm to others: unable to assess.    Recommendations:    Safety Considerations: -- Based on this psychiatric evaluation, we estimate the patient to be at potential risk for suicide in the current setting. We recommend routine level of observation per hospital policy.    IVC Status:  This patient does NOT meet IVC criteria due to lack of acute or imminent danger to self or others. -- Would not recommend initiation of IVC process at this time.   Medication Recommendations:  - Continue Lexapro  10mg  PO every day for mood - Consider reducing dose of Effexor  XR from 225mg  to 150mg  due to acute renal impairment - some recent eGFR below 45 - Consider holding Buspar  15mg  PO TID at this time due to confusion possibly 2/2 polypharmacy  - Consider reducing Xanax  from 0.25mg  po QID to TID to help with over sedation and confusion possibly 2/2 polypharmacy. If she has been on benzos chronically concern for dementia underlying her acute delirium. Consider tapering by 0.25mg  q5 days and monitor for withdrawal symptoms.  - Continue Seroquel 50mg  PO at bedtime PRN for insomnia/agitation  - Consider starting Seroquel 25mg  PO BID for acute  delirium with behavioral disturbance or acute agitation.      Medical Decision Making Capacity:  -- A formal  capacity assessement was not performed as a part of this evaluation.  If specific capacity questions arise, please contact our team as below.    Further Work-up:  -- Continue to work-up and treat possible medical conditions that may be contributing to current presentation.  - Consider ordering Vitamin B12, Folate, Vitamin D  - Given concern for polypharmacy and delirium, see if medications associated with delirium like Oxybutynin can be reduced, held, or discontinued.    Disposition:  -- Per Chart review, plan is for patient to go to a SNF   Behavioral / Environmental:  -- Please order Delirium (prevention) protocol: the following can be copied into a single misc nursing order.       - RN to open blinds every morning.       - To bedside: glasses, hearing aide, patient's own shoes. Make available to patient's when possible and encourage use.       - RN to assess orientation (person, place, & time) qam and prn, with frequent reorientation (verbal & whiteboard) & introduction of caregivers.          - Recommend extended visiting hours with familiar family/friends as feasible.       - Encourage normal sleep-wake cycle by promoting a dark, quiet environment at night and stimulating, light environment during the day.         - Turn the TV off when patient is asleep or not in use.     I was outside the hospital.  I spent 10 minutes on the real-time audio and video with the patient for a a consultation. Total time was 60 minutes and over 50% of the service was counseling and/or coordination of care within the patient unit.    The patient and/or parent/guardian has been advised of the potential risks and limitations of this mode of treatment (including, but not limited to, the absence of in-person examination) and has agreed to be treated using telemedicine. The  Patient's, patient's guardian's and/or patient's family's questions regarding telemedicine have been answered.      Prentice Gwenn Floor,  DO      Subjective:    REASON FOR CONSULT: Patient is seen in consultation at the request of Brutus Franco Shank, FNP for evaluation of Altered Mental Status/Delirium and Aggression/Agitation.   PATIENT INTERVIEW:   Patient appears acutely delirious making the interview difficult. Patient responds to few questions and seems to be responding to internal stimuli and laughing to herself. Patient gets the year and state correct, however doesn't know the month or what type of building she is in. When asked about mood, SI, HI, and AVH; patient seems to be laughing to herself.    PAST PSYCHIATRIC HISTORY: (per chart review) Prior psychiatric diagnoses: MDD recurrent severe, GAD, panic disorder  Psychiatric hospitalizations: 1 extended psychiatric hospitalization at [facility] (06/09/18-07/08/18) for ECT, seen at [facility] in the past,  Inpatient substance abuse treatment: none Suicide attempts: none - however aggression toward others noted in 2019 Non-suicidal self-injury: denies Medication trials/compliance: per 2019 H&P: multiple SSRIs, Benzos, ECT in the past  Current/previous mental health treatment: has done therapy and support groups in the past   SOCIAL HISTORY: (per chart review) Living at nursing home, [facility]      SUBSTANCE USE: (per chart review) Tobacco use: Denies historical use. Alcohol  use: Denies current use. Drug use: Denies use of marijuana,  heroin, or cocaine.   FAMILY HISTORY: (per chart review) Patient denies significant psychiatric family history.   Medical/surgical history, medications, and allergies reviewed and updated as appropriate.   Objective:    VITAL SIGNS: BP 141/71   Pulse 86   Temp 36.8 C (98.2 F) (Oral)   Resp 18   Ht 157.5 cm (5' 2.01)   Wt 81.9 kg (180 lb 8.9 oz)   SpO2 94%   BMI 33.02 kg/m    Mental Status Exam: Appearance:    limited grooming  Behavior:   disinterested  Motor:   no abnormal movements noted  Speech/Language:     Unable to access as not answering questions appropriately    Mood:    Unable to assess due to patient status/symptoms  Affect:   labile and currently laughing to herself   Thought process and Associations:   Unable to assess due to patient status/symptoms  Thought Content:     Unable to access   Perceptual disturbances:     responding to internal stimuli and Unable to access     Thoughts of self harm   Unable to access   Thoughts of harm to others   Unable to assess due to patient status/symptoms  Orientation:   Disoriented to person and place  Attention and Concentration:   Unable to assess due to patient status/symptoms  Memory:   Impaired   Fund of knowledge:    Unable to assess due to patient status/symptoms  Insight:     Poor  Judgment:    Poor  Impulse Control:   Unable to assess due to patient status/symptoms    TEST RESULTS: I have reviewed the labs, studies, and imaging from the last 24 hours if available.

## 2024-09-20 NOTE — Discharge Summary (Signed)
 ------------------------------------------------------------------------------- Attestation signed by Heath Chiquita Jansky, MD at 09/21/24 1901 I was the supervising physician on the hospitalist service. I discussed the case with the APP and agree with the plan as outlined below. -------------------------------------------------------------------------------   DISCHARGE SUMMARY [facility]   Discharge date:   September 20, 2024 Length of stay:    LOS: 7 days    Discharge Service:   [facility] Discharge Attending Physician: Brutus Franco Shank, FNP Discharge to:    To Skilled Nursing Facility Condition at Discharge:  stable Code status:                         DNR and [facility]   Hospital Course: 09/13/24 admitted with metabolic encephalopathy, altered mental status, UTI.  Admitted for IV antibiotics and IV fluids.  Patient at [facility] assisted living facility.  09/15/24 Confusion, AMS worse today.   09/16/24 Family meeting, will pursue short term rehab as pt's delirium makes her unsafe to return to independent living facility [facility].  Compromises her ability to eat, move around safely, take her medications.    09/19/24 Accepted at [facility], insurance auth obtained.  Will discharge tomorrow.    ______________________________________   Admission HPI    Patient admitted on: 09/13/2024 11:44 AM  Patient admitted by: Margart Elsie Dragon, DO    CHIEF COMPLAINT: Altered mental status   Day of admission HPI:  Yesenia Reynolds  is a 83 y.o. female with a PMH significant for sepsis with acute renal failure; acute cystitis with hematuria; generalized weakness; hypothyroidism; stress urinary incontinence; diabetes mellitus inadequately controlled; hypertension who presented with altered mental status/confusion/acute cystitis with hematuria   History obtained from patient does not recall events at nursing home, [facility].   Patient was sent from [facility] and according  to the ER notes, presented by EMS with altered mental status.  Apparently, patient found by staff sitting on the toilet and unable to get up due to confusion and what to do next.   Patient had a similar episode approximately a year past and was admitted for sepsis secondary to UTI at that time.   Additionally, patient is currently having a UTI and is supposed to have been on the antibiotic but it is unclear whether or not patient has been taking the antibiotic appropriately.   There is no other family members or staff members to assist with history.   In the ER, patient was alert to person but not to place or time per ER provider.   She sees [facility] and was last seen in April and had a 28-month follow-up which would be October.  She is supposed to be on UTI prophylaxis but we do not see any evidence of any antibiotics that she has picked up from the drugstore and taking currently.  The 2 antibiotics noted on her medication dispense profile are levofloxacin 7 or 50 mg 3 tablets to be taken over 6-day course scribed by this hospital team on September 10, 2023 and amoxicillin  875 5-day course of 10 pills prescribed on October 26, 2023.   Patient does say that she has pyuria/dysuria but no urgency frequency flank pain hematuria or hesitancy.   No other complaint.  Normal appetite.  No fever.  No abdominal pain.   Patient admitted on Home O2? - no Patient on home anticoagulant? -  no Patient admitted with Chronic home foley catheter? - no Foley catheter placed or replaced by another service prior to admission? -  no Central Line Status: NONE   Mental Status on Admission: Earlier in admission, patient was not awake alert and oriented The patient is Alert and oriented to PERSON The patient is Alert And oriented to TIME The patient is Alert and oriented to LOCATION   Today; patient laying in bed sleeping.  She is easily awakened when her name is spoken.  She is not oriented this morning to place  but able to state the year correctly. Confused conversation, [place] but follows commands and answers questions appropriately.     Problem List, Assessment & Plan     ASSESSMENT & PLAN (In order of descending acuity)   Altered mental status, acute metabolic encephalopathy Thought originally to be secondary to acute cystitis, treated with IV antibiotics Improved from initial somnolence but remains with confused conversation intermittently and psychosis like conversation, occasional hallucinations No evidence of sepsis Escalated antibx to Cefepime as pt seemed to respond better when she was on that; day 6, will discharge on 3 days of Cipro  to ensure thorough coverage/treatment Patient with psych history, follows with a psychiatrist, tele-psych consult on 10/10, recommendations followed and much appreciated Folate level normal, B12 low, B12 injection 10/12 followed by daily supplement   Acute cystitis with hematuria Urinalysis grossly abnormal on admission Urine culture shows mixed gram + and negative organisms; thought to be at least partially impacted by dose of oral antibx pt received outpatient before being admitted to the hospital White blood count normal (elevated 11.6 on admission), no longer febrile   Hyponatremia/electrolyte abnormality Sodium 130 on admission, 134 10/13 Other electrolytes normal   Anxiety, acute delirium/psychosis Continue BuSpar , Xanax , Lexapro , Effexor  home dosages although Effexor  dosage changed per recommendations from tele psychiatry (reduced to 150mg  daily), Xanax  frequency reduced to TID; Buspar  has been on hold, will resume at discharge  Patient is calm today and less confused Seroquel 25mg  bid as recommended by tele-psych; changed AM dose to 12.5mg  10/12 due to sleeping a lot 10/11, changed PM dose to 12.5mg  as well due to continued somnolence 10/12 Responding well to 12.5mg  BID   Age-related physical debility Patient worked with PT and  OT 10/8 and 10/10, she is very unsteady on her feet and lacks good balance, high fall risk Going to SNF for short term rehab today   Anticoagulation; Lovenox IV antibiotics; cefepime, discharge on Cipro  IV fluids; stopped    Incidental Findings for outpatient Follow-Up: No significant incidental findings present    Pt medically stable, ready for discharge to short term rehab at [facility].  Will transport by [facility]eta.  Family has been extensively updated and discharge instructions reviewed with them.  Discharge plan discussed with Dr. Heath attending.  Discharge to SNF.     ADDITIONAL NON-ACUTE FINDINGS, OBSERVATIONS, FAMILY DISCUSSIONS, ETC. (When present):   General; ill-appearing obese 83 year old female Cardiovascular; regular rhythm, rate 80s Pulmonary; lungs clear bilaterally, normal breathing effort, saturating well on room air Abdomen; soft, nontender, bowel sounds present all 4 quadrants Extremities; moves all extremities spontaneously, no edema Neuro; alert, oriented to person and time, not oriented to place; confused conversation, follows commands, no focal deficit; mild tremor intermittently   DVT prevention; Lovenox ______________________________________  Mental Status On day of Discharge:  The patient is Alert and oriented to PERSON The patient is Alert And oriented to TIME The patient is Alert and oriented to LOCATION  CODE STATUS :  DNR and DNI   An advanced care planning discussion was  had with patient and/or patient's decisions maker (documented separately).  Patient discharged on Home O2? - no Patient discharged on home anticoagulant? -  no  Foley Catheter status: None Central Line Status: NONE  Time Spent on Discharge I spent greater than 30 minutes counseling and coordinating care for the discharge of this patient. The pt and her sister, brother in law and I discussed the importance of outpatient follow-up as well as concerning signs and  symptoms that would require immediate evaluation by a medical professional. The aforementioned conversation participants understand  and did show insight. I did use teachback to ensure understanding. The above participant/s is aware that not following the discussed plan, recommendations, and follow up can lead to severe negative effects on the patient's health, up to and including death.  Discharge Medications     Your Medication List     STOP taking these medications    glimepiride 2 MG tablet Commonly known as: AMARYL   oxybutynin 5 MG tablet Commonly known as: DITROPAN       START taking these medications    ciprofloxacin  HCl 500 MG tablet Commonly known as: CIPRO  Take 1 tablet (500 mg total) by mouth two (2) times a day for 3 days.   cyanocobalamin 500 MCG tablet Take 1 tablet (500 mcg total) by mouth daily. Start taking on: September 21, 2024   hydrALAZINE 25 MG tablet Commonly known as: APRESOLINE Take 1 tablet (25 mg total) by mouth every eight (8) hours.   QUEtiapine 25 MG tablet Commonly known as: SEROQUEL Take 0.5 tablets (12.5 mg total) by mouth two (2) times a day.       CHANGE how you take these medications    ALPRAZolam  0.25 MG tablet Commonly known as: XANAX  Take 1 tablet (0.25 mg total) by mouth Three (3) times a day before meals for 5 days. What changed: when to take this   lisinopril 40 MG tablet Commonly known as: PRINIVIL,ZESTRIL Take 1 tablet (40 mg total) by mouth daily. What changed: Another medication with the same name was removed. Continue taking this medication, and follow the directions you see here.   metoPROLOL  succinate 50 MG 24 hr tablet Commonly known as: Toprol -XL Take 1 tablet (50 mg total) by mouth daily. Start taking on: September 21, 2024 What changed:  medication strength how much to take       CONTINUE taking these medications    aspirin 81 MG chewable tablet Chew 1 tablet (81 mg total) daily.   atorvastatin  10  MG tablet Commonly known as: LIPITOR Take 1 tablet (10 mg total) by mouth daily.   brimonidine 0.2 % ophthalmic solution Commonly known as: ALPHAGAN Administer 1 drop to both eyes two (2) times a day.   busPIRone  15 MG tablet Commonly known as: BUSPAR  Take 1 tablet (15 mg total) by mouth Three (3) times a day.   cetirizine 10 MG tablet Commonly known as: ZYRTEC Take 1 tablet (10 mg total) by mouth nightly.   cholecalciferol (vitamin D3 25 mcg (1,000 units)) 1,000 unit (25 mcg) tablet Commonly known as: cholecalciferol-25 mcg (1,000 unit) Take 1 tablet (25 mcg total) by mouth daily.   dorzolamide-timolol 22.3-6.8 mg/mL ophthalmic solution Commonly known as: COSOPT instill one drop in each eye twice daily   escitalopram  oxalate 10 MG tablet Commonly known as: LEXAPRO  Take 1 tablet (10 mg total) by mouth daily.   estradiol  0.01 % (0.1 mg/gram) vaginal  cream Commonly known as: ESTRACE  Insert 0.5 g into the vagina every Monday and Thursday.   latanoprost 0.005 % ophthalmic solution Commonly known as: XALATAN Administer 1 drop to both eyes nightly.   levothyroxine  88 mcg Cap Take 1 capsule (88 mcg total) by mouth daily.   magnesium oxide 400 mg (241.3 mg elemental) tablet Commonly known as: MAG-OX Take 1 tablet by mouth two (2) times a day.   metFORMIN 500 MG tablet Commonly known as: GLUCOPHAGE Take 1 tablet (500 mg total) by mouth in the morning and 1 tablet (500 mg total) in the evening. Take with meals.   venlafaxine  150 MG 24 hr capsule Commonly known as: EFFEXOR -XR Take 1 capsule (150 mg total) by mouth in the morning.       _____________________________________  Nutrition:                                  Diet Instructions     Discharge diet (specify)     Discharge Nutrition Therapy: Consistent Carb   Consistent Carb Level: Consistent Carb 60/60/60 (4/4/4)                     ___________________________________________  Discharge Instructions    Nutrition:                                  Diet Instructions     Discharge diet (specify)     Discharge Nutrition Therapy: Consistent Carb   Consistent Carb Level: Consistent Carb 60/60/60 (4/4/4)       Activity:                                   Activity Instructions     Activity as tolerated         Appointments:                          Follow Up:                              Follow Up instructions and Outpatient Referrals    Ambulatory Referral to Home Health     Reason for referral: Home Health   Physician to follow patient's care: PCP   Disciplines requested: Physical Therapy   Physical Therapy requested: Evaluate and treat   Requested [facility] Date: 09/19/2024   Requested follow up plan: You would evaluate and manage.   Call MD for:  persistent nausea or vomiting     Call MD for:  severe uncontrolled pain     Call MD for: Temperature > 38.5 Celsius ( > 101.3 Fahrenheit)     Discharge instructions         Allergies  Allergen Reactions  . Opioids - Morphine  Analogues Other (See Comments)    Other reaction(s): Hypotension Hypotension Hypotension  . Paroxetine Hcl     unknown  . Latex Rash     Past Medical History[1]  Past Surgical History[2]   Family History[3]   Current Medications[4]  Imaging  ECG 12 Lead Result Date: 09/13/2024 Sinus rhythm with premature atrial complexes Minimal voltage criteria for LVH, may be normal variant ( R in aVL ) Septal infarct , age undetermined Abnormal  ECG When compared with ECG of 01-Sep-2023 23:31, Nonspecific T wave abnormality has replaced inverted T waves in Inferior leads T wave inversion no longer evident in Lateral leads Confirmed by Cherie Searle ([id number]) on 09/13/2024 4:16:46 PM  CT Head Wo Contrast Result Date: 09/13/2024 Exam:  CT Head without Contrast  History:  AMS  Technique: Routine brain CT without IV contrast. AEC (automated exposure control) and/or manual techniques such as size-specific kV and mAs are  employed where appropriate to reduce radiation exposure for all CT exams.  Comparison:  None.  Findings:   BRAIN:  No CT evidence of acute infarction, hemorrhage, edema, mass or mass effect. Scattered white matter hypodensities combined with chronic brain substance volume loss indicate generalized chronic senescent change of the brain. Ventricles and basilar cisterns are symmetric dimensions without midline shift.  SOFT TISSUES:  Negative. CALVARIUM:  Negative. No fracture. SINUSES AND MASTOIDS:  No mucosal thickening or fluid.    Chronic findings as above with no acute intracranial abnormality visualized  Signed (Electronic Signature): 09/13/2024 1:12 PM Signed By: Dow JAYSON Agee, MD  XR Chest Portable Result Date: 09/13/2024 Exam:  Portable Chest  History:  83 year old with altered mental status.  Technique:  Single frontal view.  Comparison:  09/05/2023  Findings:   The heart size, mediastinal contours, pleural surfaces and pulmonary vascularity are all stable with no active CHF. The lungs are clear of acute infiltrates and consolidation. There is a low inspiratory lung volume again seen. There is no detectable pleural effusion or pneumothorax. The regional bones are stable with degenerative changes noted in the thoracic spine. Degenerative change also noted in the left shoulder similar to the prior exam.    1.    No acute cardiopulmonary disease. 2.    No interval change.  Signed (Electronic Signature): 09/13/2024 12:33 PM Signed By: Ozell JONETTA Quale, MD   Lab Results   Recent Labs    09/19/24 0954  WBC 6.2  HGB 12.7  HCT 38.3  PLT 275   Recent Labs    09/18/24 0601 09/19/24 0954  NA 135 134*  K 4.2 4.0  CL 99 100  CO2 26.6 27.5  BUN 25* 26*  CREATININE 1.02 0.97  GLU 100 119  CALCIUM  8.6 8.8  ALBUMIN 2.7* 2.8*  PROT 6.5 6.5  BILITOT 0.3 0.3  AST 17 16  ALT 12 13  ALKPHOS 108 112  MG 1.9  --   AMMONIA  --  12   No results for input(s): CKTOTAL, CKMB, PCTCKMB,  TROPONINI, EDTPNI, BNP, INR, LABPROT, APTT, DDIMER in the last 72 hours. No results for input(s): WBCUA, NITRITE, LEUKOCYTESUR, BACTERIA, RBCUA, BLOODU, GLUCOSEU, PROTEINUA, KETONESU, KETUR in the last 72 hours. No results for input(s): OPIAU, BENZU, TRICYCLIC, PCPU, AMPHU, COCAU, CANNAU, BARBU, ETOH, ACETAMIN, SALICYLATE in the last 72 hours. No results for input(s): PREGTESTUR, PREGPOC in the last 72 hours. No results for input(s): OCCULTBLD, RAPSCRN, CDIFRPCR, CDIFFNAP1, A1C, CHOL, LDL, HDL, TRIG in the last 72 hours.  No results for input(s): O2SOUR, FIO2ART, PHART, PCO2ART, PO2ART, HCO3ART, O2SATART, BEART in the last 72 hours. Pending Labs     Order Current Status   Vitamin D  1,25 dihydroxy In process       Home Medications   Prior to Admission medications  Medication Dose, Route, Frequency  brimonidine (ALPHAGAN) 0.2 % ophthalmic solution 1 drop, Both Eyes, 2 times a day  busPIRone  (BUSPAR ) 15 MG tablet 15 mg, 3 times a day (standard)  cetirizine (ZYRTEC) 10 MG  tablet 10 mg, Oral, Nightly  cholecalciferol, vitamin D3 25 mcg, 1,000 units,, (CHOLECALCIFEROL-25 MCG, 1,000 UNIT,) 1,000 unit (25 mcg) tablet 25 mcg, Oral, Daily (standard)  dorzolamide-timolol (COSOPT) 22.3-6.8 mg/mL ophthalmic solution instill one drop in each eye twice daily  estradiol  (ESTRACE ) 0.01 % (0.1 mg/gram) vaginal cream 0.5 g, Vaginal, Every Monday, Thursday  latanoprost (XALATAN) 0.005 % ophthalmic solution 1 drop, Both Eyes, Nightly  lisinopril (PRINIVIL,ZESTRIL) 40 MG tablet 1 tablet, Daily (standard)  metFORMIN (GLUCOPHAGE) 500 MG tablet 500 mg, 2 times a day with meals  oxybutynin (DITROPAN) 5 MG tablet 5 mg, Oral, 3 times a day (standard)  ALPRAZolam  (XANAX ) 0.25 MG tablet 0.25 mg, Oral, 3 times a day (AC)  aspirin 81 MG chewable tablet 81 mg, Oral, Daily (standard)  atorvastatin  (LIPITOR) 10 MG tablet 10 mg,  Oral, Daily (standard)  ciprofloxacin  HCl (CIPRO ) 500 MG tablet 500 mg, Oral, 2 times a day (standard)  cyanocobalamin 500 MCG tablet 500 mcg, Oral, Daily (standard)  escitalopram  oxalate (LEXAPRO ) 10 MG tablet 10 mg, Oral, Daily (standard)  glimepiride (AMARYL) 2 MG tablet 2 mg, Oral, Daily  hydrALAZINE (APRESOLINE) 25 MG tablet 25 mg, Oral, Every 8 hours  levothyroxine  88 mcg cap 88 mcg, Oral, Daily (standard)  lisinopril (PRINIVIL,ZESTRIL) 20 MG tablet 20 mg, Oral, Daily (standard)  magnesium oxide (MAG-OX) 400 mg (241.3 mg elemental magnesium) tablet 1 tablet, Oral, 2 times a day (standard)  metoPROLOL  succinate (TOPROL -XL) 25 MG 24 hr tablet 12.5 mg, Oral, Daily (standard)  metoPROLOL  succinate (TOPROL -XL) 50 MG 24 hr tablet 50 mg, Oral, Daily (standard)  QUEtiapine (SEROQUEL) 25 MG tablet 12.5 mg, Oral, 2 times a day (standard)  venlafaxine  (EFFEXOR -XR) 150 MG 24 hr capsule 150 mg, Oral, Daily   Hagan E Pace, FNP Hospitalist, [facility] 09/20/24, 9:28 AM       [1] Past Medical History: Diagnosis Date  . Anemia   . Arthritis   . Cancer    (CMS-HCC)    Endometrial Cancer  . Depression   . Diabetes mellitus (CMS-HCC)   . Endometrial cancer (CMS-HCC)   . Hyperlipidemia   . Hypertension   . Neuropathy   [2] Past Surgical History: Procedure Laterality Date  . BREAST BIOPSY Bilateral    2000-left, 1960's right- both benign  . CATARACT EXTRACTION    . HYSTERECTOMY    . JOINT REPLACEMENT Left    Knee  . KNEE SURGERY    . LIPOMA RESECTION    . TONSILLECTOMY    [3] Family History Problem Relation Age of Onset  . Cancer Mother   . Hypertension Mother   . Cancer Father   . Hypertension Father   [4]  Current Facility-Administered Medications:  .  acetaminophen  (TYLENOL ) tablet 650 mg, 650 mg, Oral, Q4H PRN, Vendura, Khoury William, DO, 650 mg at 09/19/24 0055 .  ALPRAZolam  (XANAX ) tablet 0.25 mg, 0.25 mg, Oral, TID, Pace, Hagan Eggleston, FNP, 0.25 mg at 09/20/24 0742 .   aspirin (ECOTRIN) tablet 81 mg, 81 mg, Oral, Daily, Pace, Hagan Eggleston, FNP, 81 mg at 09/20/24 0741 .  atorvastatin  (LIPITOR) tablet 10 mg, 10 mg, Oral, Daily, Panwala, Naitik Dhansukh, PA, 10 mg at 09/20/24 0742 .  brimonidine (ALPHAGAN) 0.2 % ophthalmic solution 1 drop, 1 drop, Both Eyes, BID, Panwala, Naitik Dhansukh, PA, 1 drop at 09/20/24 0738 .  [Provider Hold] busPIRone  (BUSPAR ) tablet 15 mg, 15 mg, Oral, TID, Pace, Hagan Eggleston, FNP, 15 mg at 09/16/24 1336 .  cefepime (MAXIPIME) 2 g in sodium chloride  0.9 % (  NS) 100 mL IVPB-connector bag, 2 g, Intravenous, Q12H, Pace, Brutus Bryant, FNP, Stopped at 09/20/24 0325 .  cetirizine (ZYRTEC) tablet 10 mg, 10 mg, Oral, Nightly, Panwala, Naitik Dhansukh, PA, 10 mg at 09/19/24 2038 .  cholecalciferol (vitamin D3 25 mcg (1,000 units)) tablet 25 mcg, 25 mcg, Oral, Daily, Panwala, Naitik Dhansukh, PA, 25 mcg at 09/20/24 0740 .  cyanocobalamin tablet 500 mcg, 500 mcg, Oral, Daily, Pace, Hagan Eggleston, FNP, 500 mcg at 09/20/24 0740 .  dextrose  (GLUTOSE) 40 % gel 15 g of dextrose , 15 g of dextrose , Oral, Q10 Min PRN, Pace, Brutus Bryant, FNP .  dextrose  50 % in water (D50W) 50 % solution 12.5 g, 12.5 g, Intravenous, Q15 Min PRN, Pace, Hagan Eggleston, FNP .  dorzolamide-timolol (COSOPT) 22.3-6.8 mg/mL ophthalmic solution 1 drop, 1 drop, Both Eyes, Q12H SCH, Pace, Brutus Bryant, FNP, 1 drop at 09/20/24 0737 .  enoxaparin (LOVENOX) syringe 40 mg, 40 mg, Subcutaneous, Q24H, Vendura, Khoury William, DO, 40 mg at 09/19/24 2039 .  escitalopram  oxalate (LEXAPRO ) tablet 10 mg, 10 mg, Oral, Daily, Panwala, Naitik Dhansukh, PA, 10 mg at 09/20/24 9257 .  estradiol  (ESTRACE ) 0.01 % (0.1 mg/gram) vaginal cream 0.5 g, 0.5 g, Vaginal, Mon,Thur, Panwala, Naitik Dhansukh, PA, 0.5 g at 09/19/24 0616 .  glucagon injection 1 mg, 1 mg, Intramuscular, Once PRN, Pace, Hagan Eggleston, FNP .  hydrALAZINE (APRESOLINE) injection 20 mg, 20 mg, Intravenous, Q6H PRN, Pace,  Hagan Eggleston, FNP .  hydrALAZINE (APRESOLINE) tablet 25 mg, 25 mg, Oral, Q8H SCH, Pace, Hagan Eggleston, FNP, 25 mg at 09/20/24 0553 .  insulin lispro (HumaLOG) injection CORRECTIONAL 0-20 Units, 0-20 Units, Subcutaneous, ACHS, Elisabeth Brutus [place], FNP, 1 Units at 09/19/24 2139 .  latanoprost (XALATAN) 0.005 % ophthalmic solution 1 drop, 1 drop, Both Eyes, Nightly, Panwala, Naitik Dhansukh, PA, 1 drop at 09/19/24 2050 .  levothyroxine  (SYNTHROID ) tablet 88 mcg, 88 mcg, Oral, daily, Feliz Margart Fallow, DO, 88 mcg at 09/20/24 0553 .  lisinopril (PRINIVIL,ZESTRIL) tablet 40 mg, 40 mg, Oral, Daily, Panwala, Naitik Dhansukh, PA, 40 mg at 09/20/24 0740 .  magnesium oxide (MAG-OX) tablet 1 tablet, 1 tablet, Oral, BID, Panwala, Naitik Dhansukh, PA, 1 tablet at 09/20/24 0737 .  metFORMIN (GLUCOPHAGE) tablet 500 mg, 500 mg, Oral, BID with meals, Panwala, Naitik Dhansukh, PA, 500 mg at 09/20/24 0741 .  metoPROLOL  succinate (Toprol -XL) 24 hr tablet 50 mg, 50 mg, Oral, Daily, Pace, Hagan Eggleston, FNP, 50 mg at 09/20/24 0736 .  ondansetron  (ZOFRAN ) injection 4 mg, 4 mg, Intravenous, Q8H PRN **OR** ondansetron  (ZOFRAN ) injection 8 mg, 8 mg, Intravenous, Q8H PRN, Feliz Margart Fallow, DO .  QUEtiapine (SEROQUEL) tablet 12.5 mg, 12.5 mg, Oral, BID, Pace, Hagan Eggleston, FNP, 12.5 mg at 09/20/24 0739 .  QUEtiapine (SEROQUEL) tablet 50 mg, 50 mg, Oral, Nightly PRN, Verdie Darin Shelvy Lonni, MD, 50 mg at 09/18/24 2348 .  venlafaxine  (EFFEXOR -XR) 24 hr capsule 150 mg, 150 mg, Oral, Daily, Pace, Hagan Eggleston, FNP, 150 mg at 09/20/24 [id number]

## 2024-09-22 DIAGNOSIS — N390 Urinary tract infection, site not specified: Secondary | ICD-10-CM | POA: Diagnosis not present

## 2024-09-22 NOTE — H&P (Signed)
 Medicine History and Physical  Assessment/Plan:  Principal Problem:   Acute cystitis with hematuria Active Problems:   Anemia due to multiple mechanisms   Anxiety and depression   Carotid artery disease   Primary hypertension   Generalized anxiety disorder   Primary open angle glaucoma of both eyes, mild stage   Metabolic encephalopathy   Inadequately controlled diabetes mellitus (CMS-HCC)   SUI (stress urinary incontinence, female)   Hypothyroidism   Acute delirium   Altered mental status   Hyponatremia   Vaginal atrophy   Environmental and seasonal allergies   Screening examination for pulmonary tuberculosis   Muscle weakness (generalized)   Psychosis    (CMS-HCC)   Age-related physical debility    __________________________________________________________________  Assessment and Plan:   ASSESSMENT & PLAN (In order of descending acuity)   Principal Problem:   Altered mental status Active Problems:   Acute cystitis with hematuria   Electrolyte abnormality   Confusion   Hyponatremia     Altered mental status//confusion Admited to hospitalist service as inpatient to the MedSurg unit with probability of greater than 48-hour stay Heart healthy diabetic diet IV antibiotics cefepime 1 g every 12 hours x 7 days; has been discontinued.  Trending troponin; negatives.  Resume appropriate home meds CBC with differential and CMP with GFR every morning; stable.  Lovenox 40 mg daily; discontinued.  PT OT consult      Acute cystitis with hematuria Urinalysis shows cloudy urine with moderate leukocyte esterase positive nitrites large blood and large bilirubin and packed bacteria and moderate mucus and large protein and 15 mg/dL ketones       Hyponatremia//hypochloremia//electrolyte abnormality Sodium = 130; we will replete and monitor Potassium normal at 3.8 Chloride low at 92; we will replete and monitor   CODE STATUS: Full code IV fluids normal saline Diet: Heart  healthy diet DVT prophylaxis: Lovenox 40 mg every 24 hours     Incidental Findings for ourpatient Follow-Up: No significant incidental findings present    ADDITIONAL NON-ACUTE FINDINGS, OBSERVATIONS, FAMILY DISCUSSIONS, ETC. (When present):   Physical exam General: No acute distress; initially confused with the ER doctor but fully oriented and awake with me HEENT [place]/AT; moist mucous membrane Heart: Heart rate wnl: Rhythm is reg    Lungs: Clear to auscultation bilaterally Abdomen: Soft nontender nondistended Extremities: No peripheral edema Skin: Dry no rash Neuropsych: Within normal limits; patient awake alert and oriented x 3.  Has improved since initially evaluated by ER provider.   DVT Prophylaxis Ordered: SQ Enoxaparin    HPI: Yesenia Reynolds is a 83 y.o. female with PMHx as reviewed in the EMR that presented to [facility] with AMS, confusion, Acute Cystitis and hematuria. She  is being admitted to [facility] center with Acute cystitis with hematuria and significant weakness.   PT is to start 09/22/2024 and OT will assess and treat starting 09/23/2024.   Continue current treatment plan as detailed above.    Opioids - morphine  analogues, Paroxetine hcl, and Latex Prior to Admission medications  Medication Dose, Route, Frequency  ALPRAZolam  (XANAX ) 0.25 MG tablet 0.25 mg, Oral, 3 times a day (AC)  aspirin 81 MG chewable tablet 81 mg, Oral, Daily (standard)  atorvastatin  (LIPITOR) 10 MG tablet 10 mg, Oral, Daily (standard)  brimonidine (ALPHAGAN) 0.2 % ophthalmic solution 1 drop, Both Eyes, 2 times a day  busPIRone  (BUSPAR ) 15 MG tablet 15 mg, 3 times a day (standard)  cetirizine (ZYRTEC) 10 MG tablet 10 mg, Oral, Nightly  cholecalciferol, vitamin D3 25 mcg, 1,000 units,, (CHOLECALCIFEROL-25 MCG, 1,000 UNIT,) 1,000 unit (25 mcg) tablet 25 mcg, Oral, Daily (standard)  ciprofloxacin  HCl (CIPRO ) 500 MG tablet 500 mg, Oral, 2 times a day (standard)   cyanocobalamin 500 MCG tablet 500 mcg, Oral, Daily (standard)  dorzolamide-timolol (COSOPT) 22.3-6.8 mg/mL ophthalmic solution instill one drop in each eye twice daily  escitalopram  oxalate (LEXAPRO ) 10 MG tablet 10 mg, Oral, Daily (standard)  estradiol  (ESTRACE ) 0.01 % (0.1 mg/gram) vaginal cream 0.5 g, Vaginal, Every Monday, Thursday  hydrALAZINE (APRESOLINE) 25 MG tablet 25 mg, Oral, Every 8 hours  latanoprost (XALATAN) 0.005 % ophthalmic solution 1 drop, Both Eyes, Nightly  levothyroxine  88 mcg cap 88 mcg, Oral, Daily (standard)  lisinopril (PRINIVIL,ZESTRIL) 40 MG tablet 1 tablet, Daily (standard)  magnesium oxide (MAG-OX) 400 mg (241.3 mg elemental magnesium) tablet 1 tablet, Oral, 2 times a day (standard)  metFORMIN (GLUCOPHAGE) 500 MG tablet 500 mg, 2 times a day with meals  metoPROLOL  succinate (TOPROL -XL) 50 MG 24 hr tablet 50 mg, Oral, Daily (standard)  QUEtiapine (SEROQUEL) 25 MG tablet 12.5 mg, Oral, 2 times a day (standard)  venlafaxine  (EFFEXOR -XR) 150 MG 24 hr capsule 150 mg, Oral, Daily   Past Medical History[1] Past Surgical History[2] Social History [3] Family History[4]  Review of Systems  Constitutional: Negative.   HENT: Negative.    Eyes: Negative.   Respiratory: Negative.    Cardiovascular: Negative.   Gastrointestinal: Negative.   Endocrine: Negative.   Genitourinary:  Positive for dysuria and urgency.  Musculoskeletal:  Positive for arthralgias, back pain and joint swelling.  Skin:  Positive for pallor.  Allergic/Immunologic: Negative.   Neurological:  Positive for weakness.  Hematological: Negative.   Psychiatric/Behavioral:  The patient is nervous/anxious.   All other systems reviewed and are negative.    Labs/Studies All lab results last 24 hours:   Recent Results (from the past 24 hours)  POCT Glucose   Collection Time: 09/21/24 11:50 AM  Result Value Ref Range   Glucose, POC 213 (H) 70 - 105 mg/dL  POCT Glucose   Collection Time: 09/21/24   4:14 PM  Result Value Ref Range   Glucose, POC 121 (H) 70 - 105 mg/dL  POCT Glucose   Collection Time: 09/22/24  7:41 AM  Result Value Ref Range   Glucose, POC 93 70 - 105 mg/dL    Physical Exam Constitutional:      Appearance: Normal appearance.  HENT:     Head: Normocephalic.     Nose: Nose normal.     Mouth/Throat:     Mouth: Mucous membranes are moist.     Pharynx: Oropharynx is clear.  Eyes:     Extraocular Movements: Extraocular movements intact.     Pupils: Pupils are equal, round, and reactive to light.  Cardiovascular:     Rate and Rhythm: Normal rate and regular rhythm.     Pulses: Normal pulses.     Heart sounds: Normal heart sounds.  Pulmonary:     Effort: Pulmonary effort is normal.     Breath sounds: Normal breath sounds.  Abdominal:     General: Abdomen is flat.     Palpations: Abdomen is soft.  Genitourinary:    General: Normal vulva.  Musculoskeletal:        General: Tenderness present. Normal range of motion.     Cervical back: Normal range of motion and neck supple.  Skin:    General: Skin is warm and dry.     Capillary  Refill: Capillary refill takes less than 2 seconds.  Neurological:     General: No focal deficit present.     Mental Status: She is alert and oriented to person, place, and time.  Psychiatric:        Mood and Affect: Mood normal.        Behavior: Behavior normal.     Vitals:   09/22/24 0741  BP: 174/59  Pulse: 90  Resp: 20  Temp: 36.7 C (98 F)  SpO2: 92%  , Body mass index is 31.78 kg/m.,    DVT prophylaxis:  aspirin Anticipated disposition: To Home with Home Health Estimated discharge:  unknown at this time  Waddell KATHEE Mines, FNP September 22, 2024 9:28 AM       [1] Past Medical History: Diagnosis Date  . Anemia   . Arthritis   . Cancer    (CMS-HCC)    Endometrial Cancer  . Depression   . Diabetes mellitus (CMS-HCC)   . Endometrial cancer (CMS-HCC)   . Hyperlipidemia   . Hypertension   . Neuropathy    [2] Past Surgical History: Procedure Laterality Date  . BREAST BIOPSY Bilateral    2000-left, 1960's right- both benign  . CATARACT EXTRACTION    . HYSTERECTOMY    . JOINT REPLACEMENT Left    Knee  . KNEE SURGERY    . LIPOMA RESECTION    . TONSILLECTOMY    [3] Social History Socioeconomic History  . Marital status: Single  Tobacco Use  . Smoking status: Never  . Smokeless tobacco: Never  Substance and Sexual Activity  . Alcohol  use: No  . Drug use: No  . Sexual activity: Not Currently   Social Drivers of Health   Financial Resource Strain: Low Risk  (09/13/2024)   Overall Financial Resource Strain (CARDIA)   . Difficulty of Paying Living Expenses: Not hard at all  Food Insecurity: No Food Insecurity (09/13/2024)   Hunger Vital Sign   . Worried About Programme researcher, broadcasting/film/video in the Last Year: Never true   . Ran Out of Food in the Last Year: Never true  Transportation Needs: No Transportation Needs (09/13/2024)   PRAPARE - Transportation   . Lack of Transportation (Medical): No   . Lack of Transportation (Non-Medical): No  Physical Activity: Inactive (09/13/2024)   Exercise Vital Sign   . Days of Exercise per Week: 0 days   . Minutes of Exercise per Session: 0 min  Stress: No Stress Concern Present (09/13/2024)   [employer] of Occupational Health - Occupational Stress Questionnaire   . Feeling of Stress: Only a little  Social Connections: Moderately Isolated (09/13/2024)   Social Connection and Isolation Panel   . Frequency of Communication with Friends and Family: More than three times a week   . Frequency of Social Gatherings with Friends and Family: More than three times a week   . Attends Religious Services: More than 4 times per year   . Active Member of Clubs or Organizations: No   . Attends Banker Meetings: Never   . Marital Status: Never married  Housing: Low Risk  (09/13/2024)   Housing   . Within the past 12 months, have you ever stayed:  outside, in a car, in a tent, in an overnight shelter, or temporarily in someone else's home (i.e. couch-surfing)?: No   . Are you worried about losing your housing?: No  [4] Family History Problem Relation Age of Onset  . Cancer Mother   .  Hypertension Mother   . Cancer Father   . Hypertension Father

## 2024-09-27 ENCOUNTER — Ambulatory Visit: Admitting: Urology

## 2024-09-28 ENCOUNTER — Telehealth (INDEPENDENT_AMBULATORY_CARE_PROVIDER_SITE_OTHER): Admitting: Psychiatry

## 2024-09-28 ENCOUNTER — Encounter (HOSPITAL_COMMUNITY): Payer: Self-pay | Admitting: Psychiatry

## 2024-09-28 DIAGNOSIS — F329 Major depressive disorder, single episode, unspecified: Secondary | ICD-10-CM

## 2024-09-28 DIAGNOSIS — F411 Generalized anxiety disorder: Secondary | ICD-10-CM

## 2024-09-28 NOTE — Progress Notes (Signed)
 Virtual Visit via Video Note  I connected with Yesenia Reynolds on 09/28/24 at  9:40 AM EDT by a video enabled telemedicine application and verified that I am speaking with the correct person using two identifiers.  Location: Patient: home Provider: office   I discussed the limitations of evaluation and management by telemedicine and the availability of in person appointments. The patient expressed understanding and agreed to proceed.      I discussed the assessment and treatment plan with the patient. The patient was provided an opportunity to ask questions and all were answered. The patient agreed with the plan and demonstrated an understanding of the instructions.   The patient was advised to call back or seek an in-person evaluation if the symptoms worsen or if the condition fails to improve as anticipated.  I provided 20 minutes of non-face-to-face time during this encounter.   Barnie Gull, MD  [facility] MD/PA/NP OP Progress Note  09/28/2024 9:53 AM Yesenia Reynolds  MRN:  [id number]  Chief Complaint:  Chief Complaint  Patient presents with   Anxiety   Depression   Memory Loss   Follow-up   HPI: This patient is an 83 year old single white female who is currently in a [facility] rehab center.  Prior to that she was living in a rest home.  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  The patient returns for follow-up after 6 weeks regarding her major depression recurrent and generalized anxiety disorder.  Unfortunately she has had recurrent UTIs and became septic and was admitted to [facility] on 09/13/2024.  Apparently she was confused and disoriented and was thought to have delirium.  She was seen on a telemedicine psychiatry consult.  Her Effexor  was decreased to 150 mg because of elevated GFR.  Her Xanax  was also decreased to 3 times daily and Seroquel was added for agitation at 25 mg twice daily.  She is now in a [facility] rehab center  and for the last 2 weeks.  She is getting PT and is slowly regaining her strength.  She is oriented to person and place today.  She is obviously no longer delirious.  She is rather sleepy and I strongly suggested to the nurse that we change the Seroquel to as needed only.  She will speak to the attending physician about this.  I really do not have any control of her medications in a different facility.  The patient denies significant depression or anxiety.  She states she is sleeping fairly well. Visit Diagnosis:    ICD-10-CM   1. Generalized anxiety disorder  F41.1     2. Major depression, chronic  F32.9       Past Psychiatric History: Patient was hospitalized about 8 years ago for depression that was responsive to ECT  Past Medical History:  Past Medical History:  Diagnosis Date   Agoraphobia with panic attacks 08/07/2016   Anxiety    Arthritis    Atrial fibrillation, currently in sinus rhythm    Cancer (HCC)    Endometrial   Depression    Endometrial cancer, FIGO stage IIIC (HCC) 08/08/2015   Essential hypertension, benign    GERD (gastroesophageal reflux disease)    Glaucoma    Hypothyroidism    Mixed hyperlipidemia    Palpitations    Spinal stenosis    Spinal stenosis of lumbar region at multiple levels 08/07/2016   Type 2 diabetes mellitus (HCC)  Uterine cancer [facility])     Past Surgical History:  Procedure Laterality Date   ABDOMINAL HYSTERECTOMY     BREAST BIOPSY     benign   CATARACT EXTRACTION W/PHACO Left 03/31/2017   Procedure: CATARACT EXTRACTION PHACO AND INTRAOCULAR LENS PLACEMENT (IOC);  Surgeon: Oneil Platts, MD;  Location: [facility];  Service: Ophthalmology;  Laterality: Left;  CDE: 8.67   CATARACT EXTRACTION W/PHACO Right 04/14/2017   Procedure: CATARACT EXTRACTION PHACO AND INTRAOCULAR LENS PLACEMENT RIGHT EYE;  Surgeon: Platts Oneil, MD;  Location: [facility];  Service: Ophthalmology;  Laterality: Right;  CDE: 8.52   diabetes     Type 2   LIPOMA EXCISION      Multiple dental extractions     PORT-A-CATH REMOVAL Right 09/02/2017   Procedure: MINOR REMOVAL PORT-A-CATH;  Surgeon: Mavis Oneil, MD;  Location: [facility];  Service: General;  Laterality: Right;   REPLACEMENT TOTAL KNEE     TONSILLECTOMY AND ADENOIDECTOMY     TOTAL KNEE ARTHROPLASTY  08/04/2012   Procedure: TOTAL KNEE ARTHROPLASTY;  Surgeon: Dempsey LULLA Moan, MD;  Location: [facility];  Service: Orthopedics;  Laterality: Left;    Family Psychiatric History: None  Family History:  Family History  Problem Relation Age of Onset   Stroke Mother    Heart failure Mother    Hypertension Father    Coronary artery disease Father     Social History:  Social History   Socioeconomic History   Marital status: Single    Spouse name: Not on file   Number of children: Not on file   Years of education: Not on file   Highest education level: Not on file  Occupational History   Not on file  Tobacco Use   Smoking status: Never   Smokeless tobacco: Never  Substance and Sexual Activity   Alcohol  use: No    Comment: 07/10/2016 per pt no   Drug use: No    Comment: 8/3/2017per pt no    Sexual activity: Never  Other Topics Concern   Not on file  Social History Narrative   Not on file   Social Drivers of Health   Financial Resource Strain: Low Risk  (09/13/2024)   Received from [facility]   Overall Financial Resource Strain (CARDIA)    How hard is it for you to pay for the very basics like food, housing, medical care, and heating?: Not hard at all  Food Insecurity: No Food Insecurity (09/13/2024)   [facility] from [facility]   Hunger Vital Sign    Within the past 12 months, you worried that your food would run out before you got the money to buy more.: Never true    Within the past 12 months, the food you bought just didn't last and you didn't have money to get more.: Never true  Transportation Needs: No Transportation Needs (09/13/2024)   Received from [facility] -  Transportation    Lack of Transportation (Medical): No    Lack of Transportation (Non-Medical): No  Physical Activity: Inactive (09/13/2024)   Received from [facility]   Exercise Vital Sign    On average, how many days per week do you engage in moderate to strenuous exercise (like a brisk walk)?: 0 days    On average, how many minutes do you engage in exercise at this level?: 0 min  Stress: No Stress Concern Present (09/13/2024)   Received from [facility]  Occupational Health - Occupational Stress Questionnaire    Do you feel stress - tense, restless, nervous, or anxious, or unable to sleep at night because your mind is troubled all the time - these days?: Only a little  Social Connections: Moderately Isolated (09/13/2024)   Received from [facility]   Social Connection and Isolation Panel    In a typical week, how many times do you talk on the phone with family, friends, or neighbors?: More than three times a week    How often do you get together with friends or relatives?: More than three times a week    How often do you attend church or religious services?: More than 4 times per year    Do you belong to any clubs or organizations such as church groups, unions, fraternal or athletic groups, or school groups?: No    How often do you attend meetings of the clubs or organizations you belong to?: Never    Are you married, widowed, divorced, separated, never married, or living with a partner?: Never married    Allergies:  Allergies  Allergen Reactions   Morphine  And Codeine Other (See Comments)    Hypotension   Other     Preservative in latanoprost causes redness, pt uses preservative free latanoprost    Paxil [Paroxetine Hcl]     unknown   Latex Rash    Metabolic Disorder Labs: No results found for: HGBA1C, MPG No results found for: PROLACTIN No results found for: CHOL, TRIG, HDL, CHOLHDL, VLDL, LDLCALC No results found for:  TSH  Therapeutic Level Labs: No results found for: LITHIUM No results found for: VALPROATE No results found for: CBMZ  Current Medications: Current Outpatient Medications  Medication Sig Dispense Refill   acetaminophen  (TYLENOL  8 HOUR ARTHRITIS PAIN) 650 MG CR tablet Take 650 mg by mouth every 8 (eight) hours as needed for pain.     ALPRAZolam  (XANAX ) 0.25 MG tablet Take 1 tablet (0.25 mg total) by mouth 4 (four) times daily. 120 tablet 3   alum & mag hydroxide-simeth (MAALOX/MYLANTA) 200-200-20 MG/5ML suspension Take 30 mLs by mouth every 6 (six) hours as needed for indigestion or heartburn.     aspirin 81 MG tablet Take 81 mg by mouth at bedtime.      atorvastatin  (LIPITOR) 10 MG tablet Take 10 mg by mouth every evening.      busPIRone  (BUSPAR ) 15 MG tablet Take 1 tablet (15 mg total) by mouth 3 (three) times daily. 90 tablet 3   calcium  carbonate (TUMS - DOSED IN MG ELEMENTAL CALCIUM ) 500 MG chewable tablet Chew 1 tablet by mouth daily as needed for indigestion or heartburn.     estradiol  (ESTRACE ) 0.1 MG/GM vaginal cream Insert 1/2 in vagina 2 nights/week 42.5 g 3   ibuprofen (ADVIL,MOTRIN) 200 MG tablet Take 200 mg by mouth 2 (two) times daily as needed for moderate pain.      latanoprost (XALATAN) 0.005 % ophthalmic solution Place 1 drop into both eyes at bedtime.     levothyroxine  (SYNTHROID , LEVOTHROID) 88 MCG tablet Take 88 mcg by mouth daily before breakfast.      lisinopril (PRINIVIL,ZESTRIL) 10 MG tablet Take 10 mg by mouth every morning.      loratadine (CLARITIN) 10 MG tablet Take 10 mg by mouth daily as needed for allergies.     metFORMIN (GLUCOPHAGE) 500 MG tablet Take by mouth.     metoprolol  succinate (TOPROL -XL) 25 MG 24 hr tablet take 1/2 tablet BY MOUTH  every morning 3 tablet 0   metoprolol  succinate (TOPROL -XL) 25 MG 24 hr tablet take 1/2 tablet BY MOUTH every morning 3 tablet 0   polyethylene glycol (MIRALAX  / GLYCOLAX ) packet Take 17 g by mouth daily as  needed for mild constipation.      venlafaxine  XR (EFFEXOR -XR) 150 MG 24 hr capsule Take 2 capsules (300 mg total) by mouth every morning. Dosage increase 180 capsule 3   Vibegron  (GEMTESA ) 75 MG TABS Take 1 tablet (75 mg total) by mouth daily. 30 tablet 11   Vibegron  75 MG TABS Take 1 tablet (75 mg total) by mouth daily.     No current facility-administered medications for this visit.     Musculoskeletal: Strength & Muscle Tone: decreased Gait & Station: unsteady Patient leans: N/A  Psychiatric Specialty Exam: Review of Systems  Musculoskeletal:  Positive for gait problem.  Neurological:  Positive for weakness.  Psychiatric/Behavioral:  Positive for confusion.   All other systems reviewed and are negative.   There were no vitals taken for this visit.There is no height or weight on file to calculate BMI.  General Appearance: Casual and Fairly Groomed  Eye Contact:  Good  Speech:  Clear and Coherent  Volume:  Normal  Mood:  Euthymic  Affect:  Flat  Thought Process:  Goal Directed  Orientation: Person and place  Thought Content: WDL   Suicidal Thoughts:  No  Homicidal Thoughts:  No  Memory:  Immediate;   Good Recent;   Fair Remote;   NA  Judgement:  Fair  Insight:  Shallow  Psychomotor Activity:  Decreased  Concentration:  Concentration: Fair and Attention Span: Fair  Recall:  [employer] of Knowledge: Fair  Language: Good  Akathisia:  No  Handed:  Right  AIMS (if indicated): not done  Assets:  Communication Skills Desire for Improvement Resilience Social Support  ADL's:  Intact  Cognition: Impaired,  Mild  Sleep:  Good   Screenings: GAD-7    Flowsheet Row Office Visit from 06/22/2024 in [facility] [place] Office Visit from 01/20/2023 in [facility] [place] Counselor from 04/20/2018 in [facility] [place] Counselor from 09/15/2016 in [facility] at [place]  Total GAD-7 Score 5 5 14 10    PHQ2-9    Flowsheet Row Office Visit from 06/22/2024 in [facility] at [place] Office Visit from 01/20/2023 in [facility] at [place] Video Visit from 07/21/2022 in [facility] at [place] Video Visit from 03/19/2022 in [facility] at [place] Video Visit from 11/26/2021 in [facility] at [facility] Total Score 1 2 0 0 1  PHQ-9 Total Score 1 7 -- -- --   Flowsheet Row ED from 08/30/2023 in [facility] at [facility] Video Visit from 07/21/2022 in [facility] at [place] Video Visit from 03/19/2022 in [facility] at [place]  C-SSRS RISK CATEGORY No Risk No Risk No Risk     Assessment and Plan: This patient is an 83 year old female with a long history of generalized anxiety disorder and severe major depression.  She recently became delirious while septic from a UTI.  She seems more stable now and is generally oriented and can speak clearly.  I do think the Seroquel is causing too much sedation and she does not really have a  need for it right now so I would make it APRN.  The nurse will communicate this to the physician.  Also suggest continuing Lexapro  10 mg daily as well as Effexor  XR 150 mg daily for depression, Xanax  0.25 mg 3 times daily for anxiety, BuSpar  15 mg 3 times daily.  She will return to see me in 6 weeks  Collaboration of Care: Collaboration of Care: Other medications were discussed with the patient's nurse at the facility.  She will communicate my recommendations to the physician.  Patient/Guardian was advised Release of Information must be obtained prior to any record release in order to collaborate their care with an outside provider. Patient/Guardian was advised if they have  not already done so to contact the registration department to sign all necessary forms in order for us  to release information regarding their care.   Consent: Patient/Guardian gives verbal consent for treatment and assignment of benefits for services provided during this visit. Patient/Guardian expressed understanding and agreed to proceed.    Barnie Gull, MD 09/28/2024, 9:53 AM

## 2024-12-26 ENCOUNTER — Telehealth: Payer: Self-pay

## 2024-12-26 NOTE — Telephone Encounter (Signed)
 Heather with [facility]. Calling to make patient an appointment for this week for recent CT scan results.  Please advise.  Call:  [phone]

## 2024-12-26 NOTE — Telephone Encounter (Signed)
 Attempted to return phone call to [facility] at [facility] she did not answer and I was unable to leave a voicemail

## 2024-12-27 ENCOUNTER — Telehealth: Payer: Self-pay

## 2024-12-27 DIAGNOSIS — N390 Urinary tract infection, site not specified: Secondary | ICD-10-CM

## 2024-12-27 NOTE — Telephone Encounter (Signed)
 Transportation called to schedule patient for a follow up based on her CT results, representative  from transportation was made aware a message regarding CT would be sent to MD and patient would be scheduled based on results representative voiced her understanding

## 2025-01-03 ENCOUNTER — Telehealth: Payer: Self-pay

## 2025-01-03 NOTE — Telephone Encounter (Signed)
 Sierra with [facility]. Left a voice message.  Requesting a call back regarding referral sent.  Please advise.  Call:  [phone]

## 2025-01-03 NOTE — Telephone Encounter (Signed)
 Follow up pending MD Dahlstedt

## 2025-01-03 NOTE — Telephone Encounter (Signed)
 See other encounter for details

## 2025-01-03 NOTE — Telephone Encounter (Signed)
 Heather from [facility] stating patient is having lower abdominal pain no other symptoms  is currently on abx for current UTI

## 2025-01-03 NOTE — Telephone Encounter (Signed)
 Returned phone call to Yesenia Reynolds who was advised a previous message with patient symptoms (lower abdominal pain and trouble urinating) have been forward to patient provider once he responds someone will be in touch Nurse stated that PCP wanted patient to be seen within a week Nurse was advised that MD Dahlstedt has reviewed patient CT and the recommendations was a US  Renal in 6 months however another message would be sent to provider

## 2025-01-03 NOTE — Addendum Note (Signed)
 Addended by: SAMMIE EXIE HERO on: 01/03/2025 12:16 PM   Modules accepted: Orders

## 2025-01-03 NOTE — Telephone Encounter (Signed)
 Results are in care everywhere.

## 2025-01-03 NOTE — Telephone Encounter (Signed)
 Called nurse to give MD Dahlsedt recommendations, I read the results of the CT scan which seem to be about the same as she has had for some time, so no acute change. I do not think it is an emergency, but it is okay to have the patient come in in the next week or 2 for check. She is on an antibiotic for what seems to be a UTI on the CT scan.  Patient scheduled appointment confirmed

## 2025-01-03 NOTE — Telephone Encounter (Signed)
 Per verbal from MD Dahlstedt, I think that is fine. She has recurring infections, she can come in before then if necessary, but follow-up ultrasound in 6 months is fine.  [facility] made aware

## 2025-01-17 ENCOUNTER — Ambulatory Visit: Admitting: Urology

## 2025-02-27 ENCOUNTER — Ambulatory Visit: Admitting: Urology

## 2025-07-19 ENCOUNTER — Ambulatory Visit: Admitting: Urology
